# Patient Record
Sex: Female | Born: 1945 | Race: White | Hispanic: No | Smoking: Never smoker
Health system: Southern US, Community
[De-identification: ages and names within clinical notes are randomized; demographics above are authoritative.]

## PROBLEM LIST (undated history)

## (undated) DIAGNOSIS — M797 Fibromyalgia: Secondary | ICD-10-CM

## (undated) DIAGNOSIS — N183 Chronic kidney disease, stage 3 unspecified: Secondary | ICD-10-CM

## (undated) DIAGNOSIS — E785 Hyperlipidemia, unspecified: Secondary | ICD-10-CM

## (undated) DIAGNOSIS — N3281 Overactive bladder: Secondary | ICD-10-CM

## (undated) DIAGNOSIS — R600 Localized edema: Secondary | ICD-10-CM

## (undated) DIAGNOSIS — N281 Cyst of kidney, acquired: Secondary | ICD-10-CM

## (undated) DIAGNOSIS — I10 Essential (primary) hypertension: Secondary | ICD-10-CM

## (undated) DIAGNOSIS — N3946 Mixed incontinence: Secondary | ICD-10-CM

## (undated) DIAGNOSIS — E1142 Type 2 diabetes mellitus with diabetic polyneuropathy: Secondary | ICD-10-CM

## (undated) DIAGNOSIS — E039 Hypothyroidism, unspecified: Secondary | ICD-10-CM

## (undated) DIAGNOSIS — Z8719 Personal history of other diseases of the digestive system: Secondary | ICD-10-CM

## (undated) DIAGNOSIS — K219 Gastro-esophageal reflux disease without esophagitis: Secondary | ICD-10-CM

## (undated) DIAGNOSIS — Z794 Long term (current) use of insulin: Secondary | ICD-10-CM

## (undated) DIAGNOSIS — Z973 Presence of spectacles and contact lenses: Secondary | ICD-10-CM

## (undated) DIAGNOSIS — E119 Type 2 diabetes mellitus without complications: Secondary | ICD-10-CM

## (undated) DIAGNOSIS — Z8616 Personal history of COVID-19: Secondary | ICD-10-CM

## (undated) DIAGNOSIS — M1990 Unspecified osteoarthritis, unspecified site: Secondary | ICD-10-CM

## (undated) DIAGNOSIS — Z9889 Other specified postprocedural states: Secondary | ICD-10-CM

## (undated) DIAGNOSIS — F319 Bipolar disorder, unspecified: Secondary | ICD-10-CM

## (undated) DIAGNOSIS — R112 Nausea with vomiting, unspecified: Secondary | ICD-10-CM

## (undated) HISTORY — PX: COLONOSCOPY: SHX174

## (undated) HISTORY — PX: VENTRAL HERNIA REPAIR: SHX424

## (undated) HISTORY — PX: CHOLECYSTECTOMY OPEN: SUR202

## (undated) HISTORY — PX: ESOPHAGOGASTRODUODENOSCOPY: SHX1529

## (undated) HISTORY — PX: TONSILLECTOMY: SUR1361

---

## 1977-07-05 HISTORY — PX: VAGINAL HYSTERECTOMY: SUR661

## 1988-07-05 HISTORY — PX: OTHER SURGICAL HISTORY: SHX169

## 1998-06-26 ENCOUNTER — Ambulatory Visit (HOSPITAL_BASED_OUTPATIENT_CLINIC_OR_DEPARTMENT_OTHER): Admission: RE | Admit: 1998-06-26 | Discharge: 1998-06-26 | Payer: Self-pay | Admitting: Surgery

## 1998-08-15 ENCOUNTER — Ambulatory Visit (HOSPITAL_BASED_OUTPATIENT_CLINIC_OR_DEPARTMENT_OTHER): Admission: RE | Admit: 1998-08-15 | Discharge: 1998-08-15 | Payer: Self-pay | Admitting: Surgery

## 1998-08-15 ENCOUNTER — Encounter: Payer: Self-pay | Admitting: Surgery

## 2002-10-19 ENCOUNTER — Ambulatory Visit (HOSPITAL_COMMUNITY): Admission: RE | Admit: 2002-10-19 | Discharge: 2002-10-19 | Payer: Self-pay | Admitting: Surgery

## 2002-10-19 ENCOUNTER — Encounter: Payer: Self-pay | Admitting: Surgery

## 2008-07-05 HISTORY — PX: ORIF TIBIA & FIBULA FRACTURES: SHX2131

## 2011-11-17 DIAGNOSIS — I10 Essential (primary) hypertension: Secondary | ICD-10-CM | POA: Insufficient documentation

## 2014-02-28 DIAGNOSIS — N393 Stress incontinence (female) (male): Secondary | ICD-10-CM | POA: Insufficient documentation

## 2014-02-28 DIAGNOSIS — K219 Gastro-esophageal reflux disease without esophagitis: Secondary | ICD-10-CM | POA: Insufficient documentation

## 2014-02-28 DIAGNOSIS — E119 Type 2 diabetes mellitus without complications: Secondary | ICD-10-CM | POA: Insufficient documentation

## 2014-02-28 DIAGNOSIS — E039 Hypothyroidism, unspecified: Secondary | ICD-10-CM | POA: Insufficient documentation

## 2014-03-21 ENCOUNTER — Emergency Department (HOSPITAL_BASED_OUTPATIENT_CLINIC_OR_DEPARTMENT_OTHER): Payer: Medicare Other

## 2014-03-21 ENCOUNTER — Encounter (HOSPITAL_BASED_OUTPATIENT_CLINIC_OR_DEPARTMENT_OTHER): Payer: Self-pay | Admitting: Emergency Medicine

## 2014-03-21 ENCOUNTER — Emergency Department (HOSPITAL_BASED_OUTPATIENT_CLINIC_OR_DEPARTMENT_OTHER)
Admission: EM | Admit: 2014-03-21 | Discharge: 2014-03-21 | Disposition: A | Discharge status: Home / Self Care | Payer: Medicare Other | Attending: Emergency Medicine | Admitting: Emergency Medicine

## 2014-03-21 DIAGNOSIS — I129 Hypertensive chronic kidney disease with stage 1 through stage 4 chronic kidney disease, or unspecified chronic kidney disease: Secondary | ICD-10-CM | POA: Diagnosis not present

## 2014-03-21 DIAGNOSIS — E119 Type 2 diabetes mellitus without complications: Secondary | ICD-10-CM | POA: Insufficient documentation

## 2014-03-21 DIAGNOSIS — R51 Headache: Secondary | ICD-10-CM | POA: Insufficient documentation

## 2014-03-21 DIAGNOSIS — Z79899 Other long term (current) drug therapy: Secondary | ICD-10-CM | POA: Diagnosis not present

## 2014-03-21 DIAGNOSIS — Z8639 Personal history of other endocrine, nutritional and metabolic disease: Secondary | ICD-10-CM | POA: Insufficient documentation

## 2014-03-21 DIAGNOSIS — N189 Chronic kidney disease, unspecified: Secondary | ICD-10-CM | POA: Insufficient documentation

## 2014-03-21 DIAGNOSIS — R519 Headache, unspecified: Secondary | ICD-10-CM

## 2014-03-21 DIAGNOSIS — Z862 Personal history of diseases of the blood and blood-forming organs and certain disorders involving the immune mechanism: Secondary | ICD-10-CM | POA: Insufficient documentation

## 2014-03-21 DIAGNOSIS — E669 Obesity, unspecified: Secondary | ICD-10-CM | POA: Diagnosis not present

## 2014-03-21 HISTORY — DX: Hyperlipidemia, unspecified: E78.5

## 2014-03-21 HISTORY — DX: Essential (primary) hypertension: I10

## 2014-03-21 LAB — CBC WITH DIFFERENTIAL/PLATELET
Basophils Absolute: 0 10*3/uL (ref 0.0–0.1)
Basophils Relative: 0 % (ref 0–1)
Eosinophils Absolute: 0.1 10*3/uL (ref 0.0–0.7)
Eosinophils Relative: 2 % (ref 0–5)
HCT: 35.6 % — ABNORMAL LOW (ref 36.0–46.0)
Hemoglobin: 11.4 g/dL — ABNORMAL LOW (ref 12.0–15.0)
Lymphocytes Relative: 22 % (ref 12–46)
Lymphs Abs: 1.8 10*3/uL (ref 0.7–4.0)
MCH: 30.6 pg (ref 26.0–34.0)
MCHC: 32 g/dL (ref 30.0–36.0)
MCV: 95.4 fL (ref 78.0–100.0)
Monocytes Absolute: 0.6 10*3/uL (ref 0.1–1.0)
Monocytes Relative: 8 % (ref 3–12)
Neutro Abs: 5.5 10*3/uL (ref 1.7–7.7)
Neutrophils Relative %: 68 % (ref 43–77)
Platelets: 258 10*3/uL (ref 150–400)
RBC: 3.73 MIL/uL — ABNORMAL LOW (ref 3.87–5.11)
RDW: 13.8 % (ref 11.5–15.5)
WBC: 8.1 10*3/uL (ref 4.0–10.5)

## 2014-03-21 LAB — COMPREHENSIVE METABOLIC PANEL
ALT: 10 U/L (ref 0–35)
AST: 18 U/L (ref 0–37)
Albumin: 3.4 g/dL — ABNORMAL LOW (ref 3.5–5.2)
Alkaline Phosphatase: 71 U/L (ref 39–117)
Anion gap: 15 (ref 5–15)
BUN: 46 mg/dL — ABNORMAL HIGH (ref 6–23)
CO2: 27 mEq/L (ref 19–32)
Calcium: 9.5 mg/dL (ref 8.4–10.5)
Chloride: 95 mEq/L — ABNORMAL LOW (ref 96–112)
Creatinine, Ser: 2.2 mg/dL — ABNORMAL HIGH (ref 0.50–1.10)
GFR calc Af Amer: 25 mL/min — ABNORMAL LOW (ref >90)
GFR calc non Af Amer: 22 mL/min — ABNORMAL LOW (ref >90)
Glucose, Bld: 164 mg/dL — ABNORMAL HIGH (ref 70–99)
Potassium: 4.4 mEq/L (ref 3.7–5.3)
Sodium: 137 mEq/L (ref 137–147)
Total Bilirubin: 0.3 mg/dL (ref 0.3–1.2)
Total Protein: 7 g/dL (ref 6.0–8.3)

## 2014-03-21 MED ORDER — BUTALBITAL-APAP-CAFFEINE 50-325-40 MG PO TABS: 1.0000 | ORAL_TABLET | Freq: Four times a day (QID) | ORAL | Status: AC | PRN

## 2014-03-21 NOTE — Discharge Instructions (Signed)

## 2014-03-21 NOTE — ED Notes (Signed)
Reports HA since this AM. Sent by PMD for evaluation and CT scan of head.

## 2014-03-21 NOTE — ED Provider Notes (Signed)
CSN: [id number]     Arrival date & time 03/21/14  1730 History   First MD Initiated Contact with Patient 03/21/14 1746     Chief Complaint  Patient presents with  . Headache    HPI Pt started with a headache this am.  It is all over her head.   Dull in nature.  Nothing seems to make it better or worse.  She has had trouble with headaches in the past but not recently.  NO history of migraine.  She went to her doctor's office today and they thought she should have a CT scan.  Patient denies any numbness or weakness. She denies any trouble with her speech. She denies any injury. She denies any fevers or chills or neck pain.  She has had a sensation of being swimmy headed. This has been ongoing for months however and not associated with this recent headache. She has not noticed any significant change associated with that.  Patient also requests having some outpatient laboratory tests drawn while she was here. She has a sheet of paper with her with some laboratory tests her doctor requested. Past Medical History  Diagnosis Date  . Diabetes mellitus without complication   . Hypertension   . Hyperlipidemia    Past Surgical History  Procedure Laterality Date  . Abdominal hysterectomy    . Cholecystectomy    . Hernia repair     No family history on file. History  Substance Use Topics  . Smoking status: Never Smoker   . Smokeless tobacco: Not on file  . Alcohol Use: No   OB History   Grav Para Term Preterm Abortions TAB SAB Ect Mult Living                 Review of Systems  All other systems reviewed and are negative.     Allergies  Sulfa antibiotics  Home Medications   Prior to Admission medications   Medication Sig Start Date End Date Taking? Authorizing Provider  butalbital-acetaminophen-caffeine (FIORICET) 50-325-40 MG per tablet Take 1 tablet by mouth every 6 (six) hours as needed for headache. 03/21/14 03/21/15  Dorie Rank, MD   BP 126/51  Pulse 79  Temp(Src) 98.1 F  (36.7 C) (Oral)  Resp 18  Ht 6' (1.829 m)  Wt 180 lb (81.647 kg)  BMI 24.41 kg/m2  SpO2 95% Physical Exam  Nursing note and vitals reviewed. Constitutional: She is oriented to person, place, and time. She appears well-developed and well-nourished. No distress.  Obese  HENT:  Head: Normocephalic and atraumatic.  Right Ear: External ear normal.  Left Ear: External ear normal.  Mouth/Throat: Oropharynx is clear and moist.  Eyes: Conjunctivae are normal. Right eye exhibits no discharge. Left eye exhibits no discharge. No scleral icterus.  Neck: Neck supple. No tracheal deviation present.  Cardiovascular: Normal rate, regular rhythm and intact distal pulses.   Pulmonary/Chest: Effort normal and breath sounds normal. No stridor. No respiratory distress. She has no wheezes. She has no rales.  Abdominal: Soft. Bowel sounds are normal. She exhibits no distension. There is no tenderness. There is no rebound and no guarding.  Musculoskeletal: She exhibits no edema and no tenderness.  Neurological: She is alert and oriented to person, place, and time. She has normal strength. No cranial nerve deficit (no facial droop, extraocular movements intact, no slurred speech) or sensory deficit. She exhibits normal muscle tone. She displays no seizure activity. Coordination normal.  No pronator drift bilateral upper extrem, able to hold  both legs off bed for 5 seconds, sensation intact in all extremities, no visual field cuts, no left or right sided neglect, normal finger-nose exam bilaterally, no nystagmus noted   Skin: Skin is warm and dry. No rash noted.  Psychiatric: She has a normal mood and affect.    ED Course  Procedures (including critical care time) Labs Review Labs Reviewed  CBC WITH DIFFERENTIAL - Abnormal; Notable for the following:    RBC 3.73 (*)    Hemoglobin 11.4 (*)    HCT 35.6 (*)    All other components within normal limits  COMPREHENSIVE METABOLIC PANEL - Abnormal; Notable for  the following:    Chloride 95 (*)    Glucose, Bld 164 (*)    BUN 46 (*)    Creatinine, Ser 2.20 (*)    Albumin 3.4 (*)    GFR calc non Af Amer 22 (*)    GFR calc Af Amer 25 (*)    All other components within normal limits    Imaging Review Ct Head Wo Contrast  03/21/2014   CLINICAL DATA:  Headache. Dizziness. Blurred vision. No known injury.  EXAM: CT HEAD WITHOUT CONTRAST  TECHNIQUE: Contiguous axial images were obtained from the base of the skull through the vertex without intravenous contrast.  COMPARISON:  None.  FINDINGS: There is no intra or extra-axial fluid collection or mass lesion. The basilar cisterns and ventricles have a normal appearance. There is no CT evidence for acute infarction or hemorrhage. No windows are unremarkable.  IMPRESSION: No evidence for acute Edit abnormality.   Electronically Signed   By: Shon Hale M.D.   On: 03/21/2014 18:40     EKG Interpretation None      MDM   Final diagnoses:  Headache, unspecified headache type  Chronic renal insufficiency, unspecified stage   No neuro abnormalities.  Sent in by PCP for head CT.  CT unremarkable.  No fever or meningismus. No neurologic abnormalities to suggest stroke.  Patient did not want any medications for pain here in the emergency department.  Doubt [place]  Discharge her home with a prescription for Fioricet for symptomatic relief. Follow up with her primary doctor. She has plans to follow up with her kidney doctor regarding her chronic renal insufficiency.    Dorie Rank, MD 03/21/14 [phone]

## 2015-08-20 DIAGNOSIS — E7800 Pure hypercholesterolemia, unspecified: Secondary | ICD-10-CM | POA: Insufficient documentation

## 2015-08-20 DIAGNOSIS — E785 Hyperlipidemia, unspecified: Secondary | ICD-10-CM | POA: Insufficient documentation

## 2015-08-20 DIAGNOSIS — E782 Mixed hyperlipidemia: Secondary | ICD-10-CM | POA: Insufficient documentation

## 2016-06-28 DIAGNOSIS — N1831 Chronic kidney disease, stage 3a: Secondary | ICD-10-CM | POA: Insufficient documentation

## 2016-06-28 DIAGNOSIS — N183 Chronic kidney disease, stage 3 unspecified: Secondary | ICD-10-CM | POA: Insufficient documentation

## 2016-09-02 ENCOUNTER — Emergency Department (HOSPITAL_COMMUNITY)
Admission: EM | Admit: 2016-09-02 | Discharge: 2016-09-03 | Disposition: A | Discharge status: Home / Self Care | Payer: Medicare Other | Attending: Emergency Medicine | Admitting: Emergency Medicine

## 2016-09-02 ENCOUNTER — Emergency Department (HOSPITAL_COMMUNITY): Payer: Medicare Other

## 2016-09-02 ENCOUNTER — Encounter (HOSPITAL_COMMUNITY): Payer: Self-pay | Admitting: Emergency Medicine

## 2016-09-02 DIAGNOSIS — E1122 Type 2 diabetes mellitus with diabetic chronic kidney disease: Secondary | ICD-10-CM | POA: Insufficient documentation

## 2016-09-02 DIAGNOSIS — R0602 Shortness of breath: Secondary | ICD-10-CM | POA: Diagnosis not present

## 2016-09-02 DIAGNOSIS — M79605 Pain in left leg: Secondary | ICD-10-CM

## 2016-09-02 DIAGNOSIS — R600 Localized edema: Secondary | ICD-10-CM | POA: Diagnosis not present

## 2016-09-02 DIAGNOSIS — N189 Chronic kidney disease, unspecified: Secondary | ICD-10-CM | POA: Insufficient documentation

## 2016-09-02 DIAGNOSIS — I129 Hypertensive chronic kidney disease with stage 1 through stage 4 chronic kidney disease, or unspecified chronic kidney disease: Secondary | ICD-10-CM | POA: Diagnosis not present

## 2016-09-02 DIAGNOSIS — M7989 Other specified soft tissue disorders: Secondary | ICD-10-CM | POA: Diagnosis present

## 2016-09-02 DIAGNOSIS — R609 Edema, unspecified: Secondary | ICD-10-CM

## 2016-09-02 DIAGNOSIS — M545 Low back pain: Secondary | ICD-10-CM | POA: Diagnosis not present

## 2016-09-02 DIAGNOSIS — N3000 Acute cystitis without hematuria: Secondary | ICD-10-CM

## 2016-09-02 LAB — CBC WITH DIFFERENTIAL/PLATELET
Basophils Absolute: 0 10*3/uL (ref 0.0–0.1)
Basophils Relative: 0 %
Eosinophils Absolute: 0 10*3/uL (ref 0.0–0.7)
Eosinophils Relative: 0 %
HCT: 41.2 % (ref 36.0–46.0)
Hemoglobin: 13.5 g/dL (ref 12.0–15.0)
Lymphocytes Relative: 25 %
Lymphs Abs: 1.6 10*3/uL (ref 0.7–4.0)
MCH: 30.3 pg (ref 26.0–34.0)
MCHC: 32.8 g/dL (ref 30.0–36.0)
MCV: 92.6 fL (ref 78.0–100.0)
Monocytes Absolute: 0.4 10*3/uL (ref 0.1–1.0)
Monocytes Relative: 7 %
Neutro Abs: 4.4 10*3/uL (ref 1.7–7.7)
Neutrophils Relative %: 68 %
Platelets: 266 10*3/uL (ref 150–400)
RBC: 4.45 MIL/uL (ref 3.87–5.11)
RDW: 16.3 % — ABNORMAL HIGH (ref 11.5–15.5)
WBC: 6.5 10*3/uL (ref 4.0–10.5)

## 2016-09-02 LAB — COMPREHENSIVE METABOLIC PANEL
ALT: 13 U/L — ABNORMAL LOW (ref 14–54)
AST: 17 U/L (ref 15–41)
Albumin: 3.4 g/dL — ABNORMAL LOW (ref 3.5–5.0)
Alkaline Phosphatase: 48 U/L (ref 38–126)
Anion gap: 7 (ref 5–15)
BUN: 34 mg/dL — ABNORMAL HIGH (ref 6–20)
CO2: 26 mmol/L (ref 22–32)
Calcium: 9.3 mg/dL (ref 8.9–10.3)
Chloride: 109 mmol/L (ref 101–111)
Creatinine, Ser: 1.58 mg/dL — ABNORMAL HIGH (ref 0.44–1.00)
GFR calc Af Amer: 37 mL/min — ABNORMAL LOW (ref >60)
GFR calc non Af Amer: 32 mL/min — ABNORMAL LOW (ref >60)
Glucose, Bld: 224 mg/dL — ABNORMAL HIGH (ref 65–99)
Potassium: 3.7 mmol/L (ref 3.5–5.1)
Sodium: 142 mmol/L (ref 135–145)
Total Bilirubin: 0.7 mg/dL (ref 0.3–1.2)
Total Protein: 6.2 g/dL — ABNORMAL LOW (ref 6.5–8.1)

## 2016-09-02 LAB — I-STAT TROPONIN, ED: Troponin i, poc: 0.01 ng/mL (ref 0.00–0.08)

## 2016-09-02 LAB — BRAIN NATRIURETIC PEPTIDE: B Natriuretic Peptide: 115.4 pg/mL — ABNORMAL HIGH (ref 0.0–100.0)

## 2016-09-02 NOTE — ED Provider Notes (Signed)
[facility] DEPT Provider Note   CSN: [id number] Arrival date & time: 09/02/16  [place]     History   Chief Complaint Chief Complaint  Patient presents with  . Leg Swelling    HPI Toni Hill is a 71 y.o. female.  Patient is a 89 -year-old female with a history of diabetes, hypertension, hyperlipidemia and chronic kidney disease presenting today with lower extremity swelling. She noticed mild right lower leg swelling for some time related to a prior surgery but saw her nephrologist [place] yesterday and was told she was dehydrated. She noticed today significant swelling of her left lower leg which she states is painful when she attempts to walk. She has had ongoing shortness of breath but does not feel that it's any worse than normal. She also complains of urinary frequency, urgency faint pink when she wipes which was started in the last 5 days but isn't significantly worse today. She states that she feels like she needs to urinate but can't.  She denies any chest pain, abdominal pain. She states she is very worried and tearful intermittently. She denies any recent medication changes. She has no fevers but has noticed some bilateral back pain.   The history is provided by the patient.    Past Medical History:  Diagnosis Date  . Diabetes mellitus without complication ([place])   . Hyperlipidemia   . Hypertension     There are no active problems to display for this patient.   Past Surgical History:  Procedure Laterality Date  . ABDOMINAL HYSTERECTOMY    . CHOLECYSTECTOMY    . HERNIA REPAIR      OB History    No data available       Home Medications    Prior to Admission medications   Not on File    Family History No family history on file.  Social History Social History  Substance Use Topics  . Smoking status: Never Smoker  . Smokeless tobacco: Never Used  . Alcohol use No     Allergies   Sulfa antibiotics   Review of Systems Review of Systems  All  other systems reviewed and are negative.    Physical Exam Updated Vital Signs BP 150/62 (BP Location: Left Arm)   Pulse 96   Temp 98.2 F (36.8 C) (Oral)   Resp 18   SpO2 98%   Physical Exam  Constitutional: She is oriented to person, place, and time. She appears well-developed and well-nourished. No distress.  Intermittently tearful on exam and appears mildly anxious  HENT:  Head: Normocephalic and atraumatic.  Mouth/Throat: Oropharynx is clear and moist.  Eyes: Conjunctivae and EOM are normal. Pupils are equal, round, and reactive to light.  Neck: Normal range of motion. Neck supple.  Cardiovascular: Normal rate, regular rhythm and intact distal pulses.   No murmur heard. Pulmonary/Chest: Effort normal and breath sounds normal. No respiratory distress. She has no wheezes. She has no rales.  Abdominal: Soft. She exhibits no distension. There is no tenderness. There is no rebound and no guarding.  Mild lower back pain with palpation. No significant CVA tenderness.  Musculoskeletal: Normal range of motion. She exhibits edema and tenderness.  1+ edema in the foot and lower leg. Tenderness with palpation to the calf and thigh.  Neurological: She is alert and oriented to person, place, and time.  Skin: Skin is warm and dry. No rash noted. No erythema.  Psychiatric: She has a normal mood and affect. Her behavior is normal.  Nursing note and vitals reviewed.    ED Treatments / Results  Labs (all labs ordered are listed, but only abnormal results are displayed) Labs Reviewed  CBC WITH DIFFERENTIAL/PLATELET - Abnormal; Notable for the following:       Result Value   RDW 16.3 (*)    All other components within normal limits  COMPREHENSIVE METABOLIC PANEL - Abnormal; Notable for the following:    Glucose, Bld 224 (*)    BUN 34 (*)    Creatinine, Ser 1.58 (*)    Total Protein 6.2 (*)    Albumin 3.4 (*)    ALT 13 (*)    GFR calc non Af Amer 32 (*)    GFR calc Af Amer 37 (*)     All other components within normal limits  BRAIN NATRIURETIC PEPTIDE - Abnormal; Notable for the following:    B Natriuretic Peptide 115.4 (*)    All other components within normal limits  URINALYSIS, ROUTINE W REFLEX MICROSCOPIC - Abnormal; Notable for the following:    Glucose, UA >=500 (*)    Ketones, ur 5 (*)    Nitrite POSITIVE (*)    Bacteria, UA MANY (*)    All other components within normal limits  URINE CULTURE  I-STAT TROPOININ, ED    EKG ED ECG REPORT   Date: 09/03/2016  Rate: 88  Rhythm: normal sinus rhythm  QRS Axis: normal  Intervals: normal  ST/T Wave abnormalities: normal  Conduction Disutrbances:none  Narrative Interpretation:   Old EKG Reviewed: unchanged  I have personally reviewed the EKG tracing and agree with the computerized printout as noted.  Radiology Dg Chest Port 1 View  Result Date: 09/02/2016 CLINICAL DATA:  Shortness of breath.  Bilateral leg swelling. EXAM: PORTABLE CHEST 1 VIEW COMPARISON:  Radiograph 11/22/2010. Report from radiographs 06/03/2014, images not available. FINDINGS: Stable mild cardiomegaly. Mild vascular congestion without overt edema. There is elevation of the right hemidiaphragm. No confluent consolidation. Heterogeneous bibasilar opacities, left greater than right. No evidence of pleural fluid. No pneumothorax. No acute osseous abnormalities are seen. IMPRESSION: 1. Vascular congestion without overt edema. Stable mild cardiomegaly. 2. Heterogeneous bibasilar opacities, favored atelectasis Electronically Signed   By: Jeb Levering M.D.   On: 09/02/2016 19:45    Procedures Procedures (including critical care time)  Medications Ordered in ED Medications  furosemide (LASIX) tablet 20 mg (not administered)  enoxaparin (LOVENOX) injection 1 mg/kg (not administered)  morphine 4 MG/ML injection 4 mg (4 mg Intravenous Given 09/03/16 0131)  ondansetron (ZOFRAN) injection 4 mg (4 mg Intravenous Given 09/03/16 0131)  cephALEXin (KEFLEX)  capsule 500 mg (500 mg Oral Given 09/03/16 0131)     Initial Impression / Assessment and Plan / ED Course  I have reviewed the triage vital signs and the nursing notes.  Pertinent labs & imaging results that were available during my care of the patient were reviewed by me and considered in my medical decision making (see chart for details).    Patient is a 71 year old female presenting today with new left lower extremity pain and swelling. Patient is also complaining of dysuria and symptoms concerning for urinary retention. She denies any abdominal distention or new shortness of breath. She has a history of chronic kidney disease and saw her nephrologist yesterday and was told she was dehydrated but patient has been eating and drinking normally. She has never had swelling in her left leg before and always has mild swelling in her right lower extremity. Concern for potential DVT  versus renal failure, urinary retention or fluid overload. CBC, CMP, BNP, troponin, EKG, venous study of the left lower extremity and UA pending. Also will do a bladder scan.  1:06 AM On bladder scan patient only has 124mL of urine. UA is concerning for a UTI with positive nitrites and many bacteria in the setting of dysuria and will treat with Keflex. Urine culture pending. Troponin within normal limits, CBC within normal limits, CMP with improving renal function with a creatinine of 1.5.  BNP only mildly elevated at 115. Chest x-ray with vascular congestion without overt edema and stable cardiomegaly. On reevaluation patient continues to complain of pain in her left lower extremity. Bedside ultrasound shows compressible femoral veins but given patient's size difficult to see from popliteal and below. Unclear what's causing all the pain and the patient's leg it may be just related to the swelling however there is no evidence of erythema or signs of cellulitis.  Will cover with Lovenox but low suspicion and so she can get formal  ultrasound tomorrow. Patient given pain medication and will ensure that she is able to ambulate. No evidence of infection or concern for septic hip. Will give a short course of Lasix for lower extremity edema. 2:06 AM Patient improved with pain medication was able to ambulate. Given follow-up for ultrasound tomorrow. Given antibiotics and pain control. Chest importance of follow-up with nephrologist and PCP. Given strict return precautions.  Final Clinical Impressions(s) / ED Diagnoses   Final diagnoses:  Peripheral edema  Left leg pain  Acute cystitis without hematuria    New Prescriptions New Prescriptions   CEPHALEXIN (KEFLEX) 500 MG CAPSULE    Take 1 capsule (500 mg total) by mouth 2 (two) times daily.   FUROSEMIDE (LASIX) 20 MG TABLET    Take 1 tablet (20 mg total) by mouth daily.   HYDROCODONE-ACETAMINOPHEN (NORCO/VICODIN) 5-325 MG TABLET    Take 1 tablet by mouth every 6 (six) hours as needed for severe pain.     Blanchie Dessert, MD 09/03/16 [phone]

## 2016-09-02 NOTE — ED Notes (Signed)
Bed: [facility] Expected date:  Expected time:  Means of arrival:  Comments: No bed 

## 2016-09-02 NOTE — ED Triage Notes (Signed)
Per [facility] patient from home for bilat leg swelling for several days. Patient called nurse line and was advised to come to ED.

## 2016-09-03 ENCOUNTER — Ambulatory Visit (HOSPITAL_BASED_OUTPATIENT_CLINIC_OR_DEPARTMENT_OTHER)
Admission: RE | Admit: 2016-09-03 | Discharge: 2016-09-03 | Disposition: A | Discharge status: Home / Self Care | Payer: Medicare Other | Source: Ambulatory Visit | Attending: Emergency Medicine | Admitting: Emergency Medicine

## 2016-09-03 DIAGNOSIS — M79609 Pain in unspecified limb: Secondary | ICD-10-CM

## 2016-09-03 DIAGNOSIS — M7989 Other specified soft tissue disorders: Secondary | ICD-10-CM | POA: Diagnosis not present

## 2016-09-03 DIAGNOSIS — R600 Localized edema: Secondary | ICD-10-CM | POA: Diagnosis not present

## 2016-09-03 LAB — URINALYSIS, ROUTINE W REFLEX MICROSCOPIC
Bilirubin Urine: NEGATIVE
Glucose, UA: >=500 mg/dL — AB
Hgb urine dipstick: NEGATIVE
Ketones, ur: 5 mg/dL — AB
Leukocytes, UA: NEGATIVE
Nitrite: POSITIVE — AB
Protein, ur: NEGATIVE mg/dL
Specific Gravity, Urine: 1.026 (ref 1.005–1.030)
Squamous Epithelial / LPF: NONE SEEN
pH: 5 (ref 5.0–8.0)

## 2016-09-03 MED ORDER — CEPHALEXIN 500 MG PO CAPS
500.0000 mg | ORAL_CAPSULE | Freq: Once | ORAL | Status: AC
  Administered 2016-09-03: 500 mg via ORAL
  Filled 2016-09-03: qty 1

## 2016-09-03 MED ORDER — HYDROCODONE-ACETAMINOPHEN 5-325 MG PO TABS: 1.0000 | ORAL_TABLET | Freq: Four times a day (QID) | ORAL | 0 refills | Status: DC | PRN

## 2016-09-03 MED ORDER — MORPHINE SULFATE (PF) 4 MG/ML IV SOLN
4.0000 mg | Freq: Once | INTRAVENOUS | Status: AC
  Administered 2016-09-03: 4 mg via INTRAVENOUS
  Filled 2016-09-03: qty 1

## 2016-09-03 MED ORDER — FUROSEMIDE 20 MG PO TABS: 20.0000 mg | ORAL_TABLET | Freq: Every day | ORAL | 0 refills | Status: DC

## 2016-09-03 MED ORDER — ONDANSETRON HCL 4 MG/2ML IJ SOLN
4.0000 mg | Freq: Once | INTRAMUSCULAR | Status: AC
  Administered 2016-09-03: 4 mg via INTRAVENOUS
  Filled 2016-09-03: qty 2

## 2016-09-03 MED ORDER — ENOXAPARIN SODIUM 120 MG/0.8ML ~~LOC~~ SOLN
120.0000 mg | Freq: Once | SUBCUTANEOUS | Status: AC
  Administered 2016-09-03: 120 mg via SUBCUTANEOUS
  Filled 2016-09-03: qty 0.8

## 2016-09-03 MED ORDER — FUROSEMIDE 40 MG PO TABS
20.0000 mg | ORAL_TABLET | Freq: Once | ORAL | Status: AC
  Administered 2016-09-03: 20 mg via ORAL
  Filled 2016-09-03: qty 1

## 2016-09-03 MED ORDER — CEPHALEXIN 500 MG PO CAPS: 500.0000 mg | ORAL_CAPSULE | Freq: Two times a day (BID) | ORAL | 0 refills | Status: DC

## 2016-09-03 NOTE — Progress Notes (Signed)
VASCULAR LAB PRELIMINARY  PRELIMINARY  PRELIMINARY  PRELIMINARY  Left lower extremity venous duplex completed.    Preliminary report:  Left:  No evidence of DVT, superficial thrombosis, or Baker's cyst.  Fin Hupp, RVS 09/03/2016, 4:59 PM

## 2016-09-05 LAB — URINE CULTURE: Culture: >=100000 — AB

## 2016-09-06 ENCOUNTER — Telehealth: Payer: Self-pay | Admitting: Emergency Medicine

## 2016-09-06 NOTE — Telephone Encounter (Signed)
Post ED Visit - Positive Culture Follow-up  Culture report reviewed by antimicrobial stewardship pharmacist:  []  Elenor Quinones, Pharm.D. []  Heide Guile, Pharm.D., BCPS []  Parks Neptune, Pharm.D. [x]  Alycia Rossetti, Pharm.D., BCPS []  [place], Pharm.D., BCPS, AAHIVP []  Legrand Como, Pharm.D., BCPS, AAHIVP []  Milus Glazier, Pharm.D. []  Stephens November, Pharm.D.  Positive urine culture Treated with cephalexin, organism sensitive to the same and no further patient follow-up is required at this time.  Hazle Nordmann 09/06/2016, 3:05 PM

## 2016-10-27 ENCOUNTER — Encounter (HOSPITAL_BASED_OUTPATIENT_CLINIC_OR_DEPARTMENT_OTHER): Payer: Self-pay | Admitting: *Deleted

## 2016-10-27 ENCOUNTER — Other Ambulatory Visit: Payer: Self-pay | Admitting: Urology

## 2016-10-27 NOTE — Progress Notes (Signed)
NPO AFTER MN.  ARRIVE AT 0730.  NEEDS ISTAT 8.  CURRENT EKG IN CHART AND EPIC.  WILL TAKE SYNTHROID AND PRILOSEC AM DOS W/ SIPS OF WATER.

## 2016-10-29 ENCOUNTER — Ambulatory Visit (HOSPITAL_BASED_OUTPATIENT_CLINIC_OR_DEPARTMENT_OTHER): Payer: Medicare Other | Admitting: Anesthesiology

## 2016-10-29 ENCOUNTER — Encounter (HOSPITAL_BASED_OUTPATIENT_CLINIC_OR_DEPARTMENT_OTHER)
Admission: RE | Disposition: A | Discharge status: Home / Self Care | Payer: Self-pay | Source: Ambulatory Visit | Attending: Urology

## 2016-10-29 ENCOUNTER — Ambulatory Visit (HOSPITAL_BASED_OUTPATIENT_CLINIC_OR_DEPARTMENT_OTHER)
Admission: RE | Admit: 2016-10-29 | Discharge: 2016-10-29 | Disposition: A | Discharge status: Home / Self Care | Payer: Medicare Other | Source: Ambulatory Visit | Attending: Urology | Admitting: Urology

## 2016-10-29 ENCOUNTER — Encounter (HOSPITAL_BASED_OUTPATIENT_CLINIC_OR_DEPARTMENT_OTHER): Payer: Self-pay | Admitting: *Deleted

## 2016-10-29 DIAGNOSIS — B373 Candidiasis of vulva and vagina: Secondary | ICD-10-CM | POA: Insufficient documentation

## 2016-10-29 DIAGNOSIS — E7800 Pure hypercholesterolemia, unspecified: Secondary | ICD-10-CM | POA: Insufficient documentation

## 2016-10-29 DIAGNOSIS — N359 Urethral stricture, unspecified: Secondary | ICD-10-CM | POA: Insufficient documentation

## 2016-10-29 DIAGNOSIS — R32 Unspecified urinary incontinence: Secondary | ICD-10-CM | POA: Insufficient documentation

## 2016-10-29 DIAGNOSIS — I129 Hypertensive chronic kidney disease with stage 1 through stage 4 chronic kidney disease, or unspecified chronic kidney disease: Secondary | ICD-10-CM | POA: Insufficient documentation

## 2016-10-29 DIAGNOSIS — N183 Chronic kidney disease, stage 3 (moderate): Secondary | ICD-10-CM | POA: Insufficient documentation

## 2016-10-29 DIAGNOSIS — E039 Hypothyroidism, unspecified: Secondary | ICD-10-CM | POA: Insufficient documentation

## 2016-10-29 DIAGNOSIS — Z880 Allergy status to penicillin: Secondary | ICD-10-CM | POA: Diagnosis not present

## 2016-10-29 DIAGNOSIS — Z79899 Other long term (current) drug therapy: Secondary | ICD-10-CM | POA: Insufficient documentation

## 2016-10-29 DIAGNOSIS — E1122 Type 2 diabetes mellitus with diabetic chronic kidney disease: Secondary | ICD-10-CM | POA: Diagnosis not present

## 2016-10-29 DIAGNOSIS — N3281 Overactive bladder: Secondary | ICD-10-CM | POA: Diagnosis not present

## 2016-10-29 DIAGNOSIS — Z794 Long term (current) use of insulin: Secondary | ICD-10-CM | POA: Insufficient documentation

## 2016-10-29 HISTORY — DX: Gastro-esophageal reflux disease without esophagitis: K21.9

## 2016-10-29 HISTORY — DX: Type 2 diabetes mellitus with diabetic polyneuropathy: E11.42

## 2016-10-29 HISTORY — DX: Bipolar disorder, unspecified: F31.9

## 2016-10-29 HISTORY — DX: Other specified postprocedural states: R11.2

## 2016-10-29 HISTORY — DX: Fibromyalgia: M79.7

## 2016-10-29 HISTORY — PX: CYSTOSCOPY WITH INJECTION: SHX1424

## 2016-10-29 HISTORY — DX: Personal history of other diseases of the digestive system: Z87.19

## 2016-10-29 HISTORY — DX: Overactive bladder: N32.81

## 2016-10-29 HISTORY — DX: Chronic kidney disease, stage 3 (moderate): N18.3

## 2016-10-29 HISTORY — DX: Chronic kidney disease, stage 3 unspecified: N18.30

## 2016-10-29 HISTORY — DX: Other specified postprocedural states: Z98.890

## 2016-10-29 HISTORY — DX: Type 2 diabetes mellitus without complications: E11.9

## 2016-10-29 HISTORY — DX: Presence of spectacles and contact lenses: Z97.3

## 2016-10-29 HISTORY — DX: Localized edema: R60.0

## 2016-10-29 HISTORY — DX: Cyst of kidney, acquired: N28.1

## 2016-10-29 HISTORY — DX: Unspecified osteoarthritis, unspecified site: M19.90

## 2016-10-29 HISTORY — DX: Hypothyroidism, unspecified: E03.9

## 2016-10-29 HISTORY — DX: Mixed incontinence: N39.46

## 2016-10-29 HISTORY — DX: Long term (current) use of insulin: Z79.4

## 2016-10-29 LAB — POCT I-STAT, CHEM 8
BUN: 24 mg/dL — ABNORMAL HIGH (ref 6–20)
Calcium, Ion: 1.23 mmol/L (ref 1.15–1.40)
Chloride: 105 mmol/L (ref 101–111)
Creatinine, Ser: 1.4 mg/dL — ABNORMAL HIGH (ref 0.44–1.00)
Glucose, Bld: 114 mg/dL — ABNORMAL HIGH (ref 65–99)
HCT: 41 % (ref 36.0–46.0)
Hemoglobin: 13.9 g/dL (ref 12.0–15.0)
Potassium: 3.9 mmol/L (ref 3.5–5.1)
Sodium: 143 mmol/L (ref 135–145)
TCO2: 29 mmol/L (ref 0–100)

## 2016-10-29 LAB — GLUCOSE, CAPILLARY: Glucose-Capillary: 108 mg/dL — ABNORMAL HIGH (ref 65–99)

## 2016-10-29 SURGERY — CYSTOSCOPY, WITH INJECTION OF BLADDER NECK OR BLADDER WALL
Anesthesia: General | Site: Bladder

## 2016-10-29 MED ORDER — FENTANYL CITRATE (PF) 100 MCG/2ML IJ SOLN
25.0000 ug | INTRAMUSCULAR | Status: DC | PRN
  Administered 2016-10-29: 25 ug via INTRAVENOUS
  Filled 2016-10-29: qty 1

## 2016-10-29 MED ORDER — DEXAMETHASONE SODIUM PHOSPHATE 10 MG/ML IJ SOLN
INTRAMUSCULAR | Status: AC
  Filled 2016-10-29: qty 1

## 2016-10-29 MED ORDER — LIDOCAINE 2% (20 MG/ML) 5 ML SYRINGE
INTRAMUSCULAR | Status: DC | PRN
  Administered 2016-10-29: 60 mg via INTRAVENOUS

## 2016-10-29 MED ORDER — ONABOTULINUMTOXINA 100 UNITS IJ SOLR
INTRAMUSCULAR | Status: AC
  Filled 2016-10-29: qty 100

## 2016-10-29 MED ORDER — FENTANYL CITRATE (PF) 100 MCG/2ML IJ SOLN
INTRAMUSCULAR | Status: DC | PRN
  Administered 2016-10-29 (×2): 50 ug via INTRAVENOUS

## 2016-10-29 MED ORDER — CEFAZOLIN SODIUM-DEXTROSE 2-4 GM/100ML-% IV SOLN
INTRAVENOUS | Status: AC
  Filled 2016-10-29: qty 100

## 2016-10-29 MED ORDER — NITROFURANTOIN MACROCRYSTAL 50 MG PO CAPS: 50.0000 mg | ORAL_CAPSULE | Freq: Every day | ORAL | 0 refills | Status: AC

## 2016-10-29 MED ORDER — OXYCHLOROSENE SODIUM POWD
Status: AC
  Filled 2016-10-29: qty 2

## 2016-10-29 MED ORDER — FENTANYL CITRATE (PF) 100 MCG/2ML IJ SOLN
INTRAMUSCULAR | Status: AC
  Filled 2016-10-29: qty 2

## 2016-10-29 MED ORDER — CEFAZOLIN SODIUM-DEXTROSE 2-3 GM-% IV SOLR
INTRAVENOUS | Status: DC | PRN
Start: 2016-10-29 — End: 2016-10-29
  Administered 2016-10-29: 2 g via INTRAVENOUS

## 2016-10-29 MED ORDER — ACETAMINOPHEN 500 MG PO TABS
1000.0000 mg | ORAL_TABLET | Freq: Four times a day (QID) | ORAL | Status: AC | PRN
  Administered 2016-10-29: 1000 mg via ORAL
  Filled 2016-10-29: qty 2

## 2016-10-29 MED ORDER — ONDANSETRON HCL 4 MG/2ML IJ SOLN
INTRAMUSCULAR | Status: AC
  Filled 2016-10-29: qty 2

## 2016-10-29 MED ORDER — DEXAMETHASONE SODIUM PHOSPHATE 10 MG/ML IJ SOLN
INTRAMUSCULAR | Status: DC | PRN
  Administered 2016-10-29: 10 mg via INTRAVENOUS

## 2016-10-29 MED ORDER — ONABOTULINUMTOXINA 100 UNITS IJ SOLR
INTRAMUSCULAR | Status: DC | PRN
  Administered 2016-10-29: 100 [IU] via INTRAMUSCULAR

## 2016-10-29 MED ORDER — LIDOCAINE 2% (20 MG/ML) 5 ML SYRINGE
INTRAMUSCULAR | Status: AC
  Filled 2016-10-29: qty 5

## 2016-10-29 MED ORDER — PROPOFOL 10 MG/ML IV BOLUS
INTRAVENOUS | Status: AC
  Filled 2016-10-29: qty 20

## 2016-10-29 MED ORDER — SODIUM CHLORIDE 0.9 % IJ SOLN
INTRAMUSCULAR | Status: DC | PRN
  Administered 2016-10-29: 10 mL

## 2016-10-29 MED ORDER — ONDANSETRON HCL 4 MG/2ML IJ SOLN
INTRAMUSCULAR | Status: DC | PRN
  Administered 2016-10-29: 4 mg via INTRAVENOUS

## 2016-10-29 MED ORDER — CIPROFLOXACIN IN D5W 400 MG/200ML IV SOLN
INTRAVENOUS | Status: AC
  Filled 2016-10-29: qty 200

## 2016-10-29 MED ORDER — PROMETHAZINE HCL 25 MG/ML IJ SOLN
6.2500 mg | INTRAMUSCULAR | Status: DC | PRN
  Filled 2016-10-29: qty 1

## 2016-10-29 MED ORDER — SODIUM CHLORIDE 0.9 % IJ SOLN
INTRAMUSCULAR | Status: AC
  Filled 2016-10-29: qty 50

## 2016-10-29 MED ORDER — BELLADONNA ALKALOIDS-OPIUM 16.2-60 MG RE SUPP
RECTAL | Status: AC
  Filled 2016-10-29: qty 1

## 2016-10-29 MED ORDER — PROPOFOL 10 MG/ML IV BOLUS
INTRAVENOUS | Status: AC
  Filled 2016-10-29: qty 40

## 2016-10-29 MED ORDER — SODIUM CHLORIDE 0.9 % IV SOLN
INTRAVENOUS | Status: DC
  Administered 2016-10-29: 09:00:00 via INTRAVENOUS
  Filled 2016-10-29: qty 1000

## 2016-10-29 MED ORDER — LIDOCAINE HCL 2 % EX GEL
CUTANEOUS | Status: AC
  Filled 2016-10-29: qty 5

## 2016-10-29 MED ORDER — PROPOFOL 10 MG/ML IV BOLUS
INTRAVENOUS | Status: DC | PRN
  Administered 2016-10-29: 190 mg via INTRAVENOUS
  Administered 2016-10-29: 50 mg via INTRAVENOUS

## 2016-10-29 MED ORDER — ACETAMINOPHEN 500 MG PO TABS
ORAL_TABLET | ORAL | Status: AC
  Filled 2016-10-29: qty 2

## 2016-10-29 SURGICAL SUPPLY — 22 items
BAG DRAIN URO-CYSTO SKYTR STRL (DRAIN) ×2 IMPLANT
BOOTIES KNEE HIGH SLOAN (MISCELLANEOUS) ×2 IMPLANT
CATH ROBINSON RED A/P 12FR (CATHETERS) IMPLANT
CLOTH BEACON ORANGE TIMEOUT ST (SAFETY) ×2 IMPLANT
ELECT REM PT RETURN 9FT ADLT (ELECTROSURGICAL)
ELECTRODE REM PT RTRN 9FT ADLT (ELECTROSURGICAL) IMPLANT
GLOVE BIO SURGEON STRL SZ7.5 (GLOVE) ×2 IMPLANT
GOWN STRL REUS W/ TWL LRG LVL3 (GOWN DISPOSABLE) ×1 IMPLANT
GOWN STRL REUS W/ TWL XL LVL3 (GOWN DISPOSABLE) ×1 IMPLANT
GOWN STRL REUS W/TWL LRG LVL3 (GOWN DISPOSABLE) ×1
GOWN STRL REUS W/TWL XL LVL3 (GOWN DISPOSABLE) ×1
KIT RM TURNOVER CYSTO AR (KITS) ×2 IMPLANT
MANIFOLD NEPTUNE II (INSTRUMENTS) ×2 IMPLANT
NDL SAFETY ECLIPSE 18X1.5 (NEEDLE) ×1 IMPLANT
NEEDLE HYPO 18GX1.5 SHARP (NEEDLE) ×1
NEEDLE SPNL 22GX7 QUINCKE BK (NEEDLE) IMPLANT
PACK CYSTO (CUSTOM PROCEDURE TRAY) ×2 IMPLANT
SYR 20CC LL (SYRINGE) IMPLANT
SYR BULB IRRIGATION 50ML (SYRINGE) IMPLANT
SYRINGE 10CC LL (SYRINGE) ×4 IMPLANT
TUBE CONNECTING 12X1/4 (SUCTIONS) ×2 IMPLANT
WATER STERILE IRR 3000ML UROMA (IV SOLUTION) ×2 IMPLANT

## 2016-10-29 NOTE — Anesthesia Procedure Notes (Signed)
Performed by: [facility]

## 2016-10-29 NOTE — Anesthesia Procedure Notes (Signed)
Procedure Name: LMA Insertion Date/Time: 10/29/2016 9:02 AM Performed by: Bethena Roys T Pre-anesthesia Checklist: Patient identified, Emergency Drugs available, Suction available and Patient being monitored Patient Re-evaluated:Patient Re-evaluated prior to inductionOxygen Delivery Method: Circle system utilized Preoxygenation: Pre-oxygenation with 100% oxygen Intubation Type: IV induction Ventilation: Mask ventilation without difficulty LMA: LMA inserted LMA Size: 4.0 Number of attempts: 1 Airway Equipment and Method: Bite block Placement Confirmation: positive ETCO2 Tube secured with: Tape Dental Injury: Teeth and Oropharynx as per pre-operative assessment

## 2016-10-29 NOTE — Op Note (Signed)
Pre-operative diagnosis :  Overactive bladdr with urinary frequency and urgency.   Postoperative diagnosis: Same, plus urethral stenosis  Operation:  Urethral dilation to 9F, cystoscopy, Botox submucosal injection ( 100units)  Surgeon:  S. Gaynelle Arabian, MD  First assistant:  None  Anesthesia: General LMA  Preparation: After appropriate premedication, the patient was brought the operative room, placed on the operating table in the dorsal supine position where general LMA anesthesia was introduced. She was then replaced in the dorsal lithotomy position with the pubis was prepped with Betadine solution and draped in the usual fashion. The history was double checked. The arm and was double checked.  Review history:   HPI: Toni Hill is a 71 year-old female established patient who is here for urinary frequency.  Ms. Pinch presents today with complaints of dysuria, bladder pressure, vaginal burning, and vaginal itching. She also complains of increased frequency, urgency, and urge incontinence. She has been placed on several antibiotics recently for URI. She was placed on Levaquin most recently, 10/08/16, and completed the course. She states that her urinary symptoms started when she began her Levaquin. She denies any fevers, gross hematuria, or flank pain. She is very adamant about wanting me to prescribe a Z-Pack and Prednisone for her URI today.    She does urinate more frequently than once every 4 hours in the daytime.   She does not have to strain or bear down to start her urinary stream. She does not have an abnormal sensation when needing to urinate.   She is having problems with urinary control or incontinence.   Statement of  Likelihood of Success: Excellent. 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 The injection scope  was then admitted easily, using the obturator, which was then removed, and the injection mechanism placed. A 10 mL syringe containing 100 units of Botox, reconstituted, was then placed on the injection scope. 10 separate submucosal areas of injection were selected above the trigone, with excellent medication delivery. No bleeding was noted. After the 10 injections, a 1 mL saline injection was delivered, 2 layer out any available medication left in the needle.  The bladder was drained of fluid, and the instrument withdrawn. The patient was awakened, and taken to recovery room in good condition.

## 2016-10-29 NOTE — H&P (Signed)
Office Visit Report     10/22/2016   --------------------------------------------------------------------------------   Toni Hill  MRN: [id number]  PRIMARY CARE:  Armanda Heritage, NP  DOB: May 30, 1946, 71 year old Female  REFERRING:  Brenn Deziel I. Gaynelle Arabian, MD   PROVIDER:  Carolan Clines, M.D.    TREATING:  Azucena Fallen    LOCATION:  [facility] [phone]   --------------------------------------------------------------------------------   CC: I urinate too frequently in the daytime.  HPI: Toni Hill is a 71 year-old female established patient who is here for urinary frequency.  [name] presents today with complaints of dysuria, bladder pressure, vaginal burning, and vaginal itching. She also complains of increased frequency, urgency, and urge incontinence. She has been placed on several antibiotics recently for URI. She was placed on Levaquin most recently, 10/08/16, and completed the course. She states that her urinary symptoms started when she began her Levaquin. She denies any fevers, gross hematuria, or flank pain. She is very adamant about wanting me to prescribe a Z-Pack and Prednisone for her URI today.    She does urinate more frequently than once every 4 hours in the daytime.   She does not have to strain or bear down to start her urinary stream. She does not have an abnormal sensation when needing to urinate.   She is having problems with urinary control or incontinence.     ALLERGIES: Penicillin - Hives    MEDICATIONS: Macrodantin 50 mg capsule 1 capsule PO Daily  Myrbetriq 50 mg tablet, extended release 24 hr  Omeprazole 20 mg tablet, delayed release  Simvastatin 40 mg tablet  Calcium 600-Vit D3  Cephalexin 250 mg capsule  Iron  Synthroid 137 mcg tablet  Vitamin D3 1,000 unit capsule  Zetia 10 mg tablet     GU PSH: Complex cystometrogram, w/ void pressure and urethral pressure profile studies, any technique - 09/28/2016 Complex  Uroflow - 09/28/2016 Emg surf Electrd - 09/28/2016 Hysterectomy Inject For cystogram - 09/28/2016 Intrabd voidng Press - 09/28/2016    NON-GU PSH: Cholecystectomy (laparoscopic) Hernia Repair    GU PMH: Urinary Urgency - 10/12/2016, - 09/20/2016 Nocturia - 09/28/2016 Urge incontinence - 09/28/2016 Stage 3 CKD (GFR 30-60)    NON-GU PMH: Arrhythmia GERD Hypercholesterolemia Hypertension Personal history of other endocrine, nutritional and metabolic disease Personal history of other mental and behavioral disorders    FAMILY HISTORY: Cancer - Father, Sister, Mother Deceased - Father, Mother Heart Disease - Runs in Family   SOCIAL HISTORY: Does not pertain to patient.      Notes: 1 son, 1 daughter   REVIEW OF SYSTEMS:    GU Review Female:   Patient reports frequent urination, hard to postpone urination, burning /pain with urination, get up at night to urinate, and leakage of urine. Patient denies stream starts and stops, trouble starting your stream, have to strain to urinate, and currently pregnant.  Gastrointestinal (Upper):   Patient reports nausea and indigestion/ heartburn. Patient denies vomiting.  Gastrointestinal (Lower):   Patient reports constipation. Patient denies diarrhea.  Constitutional:   Patient denies fever, night sweats, weight loss, and fatigue.  Skin:   Patient reports itching. Patient denies skin rash/ lesion.  Eyes:   Patient reports blurred vision. Patient denies double vision.  Ears/ Nose/ Throat:   Patient reports sinus problems. Patient denies sore throat.  Hematologic/Lymphatic:   Patient denies swollen glands and easy bruising.  Cardiovascular:   Patient reports leg swelling. Patient denies chest pains.  Respiratory:   Patient  reports cough and shortness of breath.   Endocrine:   Patient reports excessive thirst.   Musculoskeletal:   Patient reports back pain and joint pain.   Neurological:   Patient denies headaches and dizziness.  Psychologic:    Patient reports depression and anxiety.    VITAL SIGNS:      10/22/2016 01:38 PM  BP 156/87 mmHg  Pulse 97 /min  Temperature 97.1 F / 36 C  Notes: Advised to reassess BP later today.    GU PHYSICAL EXAMINATION:    External Genitalia: No hirsuitism, no rash, no scarring, no cyst, no erythematous lesion, no papular lesion, no blanched lesion, no warty lesion, no labial adhesions, no atrophic introitus. No edema. Erythemia and irritation oberved to labia.   Vagina: No atrophy, no stenosis. No rectocele. No cystocele. No enterocele. Erythema and mucosal irritation observed, no obvious vaginal discharge observed.    MULTI-SYSTEM PHYSICAL EXAMINATION:    Constitutional: Well-nourished. No physical deformities. Normally developed. Good grooming. She is very tearful and upset during exam today.   Respiratory: No labored breathing, no use of accessory muscles. Minimal wheezing heard in right and left upper lobe.   Cardiovascular: Normal temperature, bilateral lower extremity swelling with right > left, no varicosities, RRR.   Skin: No paleness, no jaundice, no cyanosis. No lesion, no ulcer, no rash.  Neurologic / Psychiatric: Patient anxious. Oriented to time, oriented to place, oriented to person. No depression, no agitation.   Gastrointestinal: No mass, no tenderness, no rigidity, non obese abdomen.  Musculoskeletal: Spine, ribs, pelvis no bilateral tenderness. Normal gait and station of head and neck.     PAST DATA REVIEWED:  Source Of History:  Patient  Records Review:   Previous Patient Records  Urine Test Review:   Urinalysis  Urodynamics Review:   Review Urodynamics Tests   PROCEDURES:           PVR Ultrasound - 26378  Scanned Volume: 129 cc         Urinalysis w/Scope Dipstick Dipstick Cont'd Micro  Color: Amber Bilirubin: Neg WBC/hpf: 0 - 5/hpf  Appearance: Cloudy Ketones: Trace RBC/hpf: 0 - 2/hpf  Specific Gravity: 1.025 Blood: Neg Bacteria: Mod (26-50/hpf)  pH: <=5.0  Protein: Neg Cystals: NS (Not Seen)  Glucose: Neg Urobilinogen: 1.0 Casts: NS (Not Seen)    Nitrites: Neg Trichomonas: Not Present    Leukocyte Esterase: Neg Mucous: Present      Epithelial Cells: 6 - 10/hpf      Yeast: NS (Not Seen)      Sperm: Not Present    ASSESSMENT:      ICD-10 Details  1 GU:   Urinary Frequency - R35.0   2   Candidiasis of vulva and vagina - B37.3    PLAN:            Medications New Meds: Diflucan 100 mg tablet 1 tablet PO Daily   #3  0 Refill(s)    Stop Meds: Levaquin 500 mg tablet  Discontinue: 10/22/2016  - Reason: The medication cycle was completed.            Orders Labs Urine Culture          Schedule Return Visit/Planned Activity: Other See Visit Notes          Document Letter(s):  Created for Patient: Clinical Summary         Notes:   UA today is not concerning for infection, but I will send urine for culture. If culture is positive, I  will treat for infection accordingly. Given recent antibiotic therapy and clinical presentation, I feel that her symptoms are likely related to a vaginal yeast infection. I will prescribe Diflucan today. We discussed indications and risks of this medication. PVR today is elevated when compared to during UDS. I feel this may likely be related to discomfort. I recommended that she practice double voiding at this time. I encouraged adequate hydration and avoidance of bladder irritants. Looking over her history, her other LUTS appear to be about the same as previously documented. She is planning to undergo Botox therapy with Dr. Gaynelle Arabian. Our office will be in touch regarding that matter. I recommended that she follow up with her PCP or at urgent care regarding her continued cough, as I will not prescribe antimicrobial therapy or prednisone for her today given that I am unfamiliar with her full medical history. She may follow up sooner, if needed, with progressive symptoms.     Signed by Azucena Fallen on 10/25/16 at  10:05 AM (EDT)    The information contained in this medical record document is considered private and confidential patient information. This information can only be used for the medical diagnosis and/or medical services that are being provided by the patient's selected caregivers. This information can only be distributed outside of the patient's care if the patient agrees and signs waivers of authorization for this information to be sent to an outside source or route.

## 2016-10-29 NOTE — Transfer of Care (Signed)
Immediate Anesthesia Transfer of Care Note  Patient: Toni Hill  Procedure(s) Performed: Procedure(s): CYSTOSCOPY WITH INJECTION BOTOX 100 UNITS, urethral dilation (N/A)  Patient Location: PACU  Anesthesia Type:General  Level of Consciousness: awake and sedated  Airway & Oxygen Therapy: Patient Spontanous Breathing and Patient connected to nasal cannula oxygen  Post-op Assessment: Report given to RN and Post -op Vital signs reviewed and stable  Post vital signs: Reviewed and stable  Last Vitals:124/59,  80, 18, 97%, 97.6 Vitals:   10/29/16 0741  BP: (!) 157/68  Pulse: 96  Resp: 16  Temp: 36.4 C    Last Pain:  Vitals:   10/29/16 0741  TempSrc: Oral      Patients Stated Pain Goal: 6 (97/53/00 5110)  Complications: No apparent anesthesia complications

## 2016-10-29 NOTE — Discharge Instructions (Addendum)
°  Post Anesthesia Home Care Instructions  Activity: Get plenty of rest for the remainder of the day. A responsible individual must stay with you for 24 hours following the procedure.  For the next 24 hours, DO NOT: -Drive a car -Paediatric nurse -Drink alcoholic beverages -Take any medication unless instructed by your physician -Make any legal decisions or sign important papers.  Meals: Start with liquid foods such as gelatin or soup. Progress to regular foods as tolerated. Avoid greasy, spicy, heavy foods. If nausea and/or vomiting occur, drink only clear liquids until the nausea and/or vomiting subsides. Call your physician if vomiting continues.  Special Instructions/Symptoms: Your throat may feel dry or sore from the anesthesia or the breathing tube placed in your throat during surgery. If this causes discomfort, gargle with warm salt water. The discomfort should disappear within 24 hours.  If you had a scopolamine patch placed behind your ear for the management of post- operative nausea and/or vomiting:  1. The medication in the patch is effective for 72 hours, after which it should be removed.  Wrap patch in a tissue and discard in the trash. Wash hands thoroughly with soap and water. 2. You may remove the patch earlier than 72 hours if you experience unpleasant side effects which may include dry mouth, dizziness or visual disturbances. 3. Avoid touching the patch. Wash your hands with soap and water after contact with the patch.   CYSTOSCOPY HOME CARE INSTRUCTIONS  Activity: Rest for the remainder of the day.  Do not drive or operate equipment today.  You may resume normal activities in one to two days as instructed by your physician.   Meals: Drink plenty of liquids and eat light foods such as gelatin or soup this evening.  You may return to a normal meal plan tomorrow.  Return to Work: You may return to work in one to two days or as instructed by your physician.  Special  Instructions / Symptoms:  Botox may begin to work within 24 hrs, but may  take up to 3 weeks to be effective. Call if you develop tremors, fever > 101, chills, urinary burning or bleeding.  Call your physician if any of these symptoms occur:   -persistent or heavy bleeding  -bleeding which continues after first few urination  -large blood clots that are difficult to pass  -urine stream diminishes or stops completely  -fever equal to or higher than 101 degrees Farenheit.  -cloudy urine with a strong, foul odor  -severe pain  Females should always wipe from front to back after elimination.  You may feel some burning pain when you urinate.  This should disappear with time.  Applying moist heat to the lower abdomen or a hot tub bath may help relieve the pain. \   Patient Signature:  ________________________________________________________  Nurse's Signature:  ________________________________________________________

## 2016-10-29 NOTE — Interval H&P Note (Signed)
History and Physical Interval Note:  10/29/2016 8:50 AM  Toni Hill  has presented today for surgery, with the diagnosis of OVER ACTIVE BLADDER  The various methods of treatment have been discussed with the patient and family. After consideration of risks, benefits and other options for treatment, the patient has consented to  Procedure(s): CYSTOSCOPY WITH INJECTION BOTOX 100 UNITS (N/A) as a surgical intervention .  The patient's history has been reviewed, patient examined, no change in status, stable for surgery.  I have reviewed the patient's chart and labs.  Questions were answered to the patient's satisfaction.     Nikitha Mode I Violette Morneault

## 2016-10-29 NOTE — Anesthesia Preprocedure Evaluation (Signed)
Anesthesia Evaluation  Patient identified by MRN, date of birth, ID band Patient awake    Reviewed: Allergy & Precautions, NPO status , Patient's Chart, lab work & pertinent test results  Airway Mallampati: II  TM Distance: >3 FB Neck ROM: Full    Dental no notable dental hx.    Pulmonary neg pulmonary ROS,    Pulmonary exam normal breath sounds clear to auscultation       Cardiovascular hypertension, Normal cardiovascular exam Rhythm:Regular Rate:Normal     Neuro/Psych negative neurological ROS  negative psych ROS   GI/Hepatic negative GI ROS, Neg liver ROS,   Endo/Other  diabetes, Insulin DependentHypothyroidism   Renal/GU Renal InsufficiencyRenal disease  negative genitourinary   Musculoskeletal negative musculoskeletal ROS (+)   Abdominal   Peds negative pediatric ROS (+)  Hematology negative hematology ROS (+)   Anesthesia Other Findings   Reproductive/Obstetrics negative OB ROS                             Anesthesia Physical Anesthesia Plan  ASA: III  Anesthesia Plan: General   Post-op Pain Management:    Induction: Intravenous  Airway Management Planned: LMA  Additional Equipment:   Intra-op Plan:   Post-operative Plan: Extubation in OR  Informed Consent: I have reviewed the patients History and Physical, chart, labs and discussed the procedure including the risks, benefits and alternatives for the proposed anesthesia with the patient or authorized representative who has indicated his/her understanding and acceptance.   Dental advisory given  Plan Discussed with: CRNA and Surgeon  Anesthesia Plan Comments:         Anesthesia Quick Evaluation

## 2016-10-29 NOTE — Anesthesia Postprocedure Evaluation (Addendum)
Anesthesia Post Note  Patient: Toni Hill  Procedure(s) Performed: Procedure(s) (LRB): CYSTOSCOPY WITH INJECTION BOTOX 100 UNITS, urethral dilation (N/A)  Patient location during evaluation: PACU Anesthesia Type: General Level of consciousness: awake and alert Pain management: pain level controlled Vital Signs Assessment: post-procedure vital signs reviewed and stable Respiratory status: spontaneous breathing, nonlabored ventilation, respiratory function stable and patient connected to nasal cannula oxygen Cardiovascular status: blood pressure returned to baseline and stable Postop Assessment: no signs of nausea or vomiting Anesthetic complications: no       Last Vitals:  Vitals:   10/29/16 1045 10/29/16 1100  BP: (!) 151/69 (!) 165/62  Pulse: 78 80  Resp: (!) 25 (!) 22  Temp:      Last Pain:  Vitals:   10/29/16 1130  TempSrc:   PainSc: 0-No pain                 Kewan Mcnease S

## 2016-11-01 ENCOUNTER — Encounter (HOSPITAL_BASED_OUTPATIENT_CLINIC_OR_DEPARTMENT_OTHER): Payer: Self-pay | Admitting: Urology

## 2016-11-20 ENCOUNTER — Emergency Department (HOSPITAL_COMMUNITY)
Admission: EM | Admit: 2016-11-20 | Discharge: 2016-11-20 | Disposition: A | Discharge status: Home / Self Care | Payer: Medicare Other | Attending: Emergency Medicine | Admitting: Emergency Medicine

## 2016-11-20 ENCOUNTER — Encounter (HOSPITAL_COMMUNITY): Payer: Self-pay | Admitting: Emergency Medicine

## 2016-11-20 ENCOUNTER — Emergency Department (HOSPITAL_COMMUNITY): Payer: Medicare Other

## 2016-11-20 DIAGNOSIS — N132 Hydronephrosis with renal and ureteral calculous obstruction: Secondary | ICD-10-CM | POA: Insufficient documentation

## 2016-11-20 DIAGNOSIS — Z79899 Other long term (current) drug therapy: Secondary | ICD-10-CM | POA: Insufficient documentation

## 2016-11-20 DIAGNOSIS — E039 Hypothyroidism, unspecified: Secondary | ICD-10-CM | POA: Insufficient documentation

## 2016-11-20 DIAGNOSIS — N200 Calculus of kidney: Secondary | ICD-10-CM

## 2016-11-20 DIAGNOSIS — E1122 Type 2 diabetes mellitus with diabetic chronic kidney disease: Secondary | ICD-10-CM | POA: Insufficient documentation

## 2016-11-20 DIAGNOSIS — Z794 Long term (current) use of insulin: Secondary | ICD-10-CM | POA: Insufficient documentation

## 2016-11-20 DIAGNOSIS — R1030 Lower abdominal pain, unspecified: Secondary | ICD-10-CM | POA: Diagnosis present

## 2016-11-20 DIAGNOSIS — I129 Hypertensive chronic kidney disease with stage 1 through stage 4 chronic kidney disease, or unspecified chronic kidney disease: Secondary | ICD-10-CM | POA: Diagnosis not present

## 2016-11-20 DIAGNOSIS — N183 Chronic kidney disease, stage 3 (moderate): Secondary | ICD-10-CM | POA: Insufficient documentation

## 2016-11-20 DIAGNOSIS — R109 Unspecified abdominal pain: Secondary | ICD-10-CM

## 2016-11-20 LAB — CBC WITH DIFFERENTIAL/PLATELET
Basophils Absolute: 0 10*3/uL (ref 0.0–0.1)
Basophils Relative: 0 %
Eosinophils Absolute: 0 10*3/uL (ref 0.0–0.7)
Eosinophils Relative: 0 %
HCT: 39 % (ref 36.0–46.0)
Hemoglobin: 12.8 g/dL (ref 12.0–15.0)
Lymphocytes Relative: 13 %
Lymphs Abs: 1.5 10*3/uL (ref 0.7–4.0)
MCH: 31.8 pg (ref 26.0–34.0)
MCHC: 32.8 g/dL (ref 30.0–36.0)
MCV: 96.8 fL (ref 78.0–100.0)
Monocytes Absolute: 0.8 10*3/uL (ref 0.1–1.0)
Monocytes Relative: 7 %
Neutro Abs: 9.4 10*3/uL — ABNORMAL HIGH (ref 1.7–7.7)
Neutrophils Relative %: 80 %
Platelets: 283 10*3/uL (ref 150–400)
RBC: 4.03 MIL/uL (ref 3.87–5.11)
RDW: 14.3 % (ref 11.5–15.5)
WBC: 11.7 10*3/uL — ABNORMAL HIGH (ref 4.0–10.5)

## 2016-11-20 LAB — COMPREHENSIVE METABOLIC PANEL
ALT: 15 U/L (ref 14–54)
AST: 23 U/L (ref 15–41)
Albumin: 3.1 g/dL — ABNORMAL LOW (ref 3.5–5.0)
Alkaline Phosphatase: 68 U/L (ref 38–126)
Anion gap: 8 (ref 5–15)
BUN: 34 mg/dL — ABNORMAL HIGH (ref 6–20)
CO2: 23 mmol/L (ref 22–32)
Calcium: 9 mg/dL (ref 8.9–10.3)
Chloride: 107 mmol/L (ref 101–111)
Creatinine, Ser: 1.59 mg/dL — ABNORMAL HIGH (ref 0.44–1.00)
GFR calc Af Amer: 37 mL/min — ABNORMAL LOW (ref >60)
GFR calc non Af Amer: 32 mL/min — ABNORMAL LOW (ref >60)
Glucose, Bld: 163 mg/dL — ABNORMAL HIGH (ref 65–99)
Potassium: 3.9 mmol/L (ref 3.5–5.1)
Sodium: 138 mmol/L (ref 135–145)
Total Bilirubin: 0.9 mg/dL (ref 0.3–1.2)
Total Protein: 5.8 g/dL — ABNORMAL LOW (ref 6.5–8.1)

## 2016-11-20 LAB — URINALYSIS, ROUTINE W REFLEX MICROSCOPIC
Bilirubin Urine: NEGATIVE
Glucose, UA: NEGATIVE mg/dL
Hgb urine dipstick: NEGATIVE
Ketones, ur: 20 mg/dL — AB
Nitrite: NEGATIVE
Protein, ur: NEGATIVE mg/dL
Specific Gravity, Urine: 1.023 (ref 1.005–1.030)
pH: 5 (ref 5.0–8.0)

## 2016-11-20 LAB — I-STAT CREATININE, ED: Creatinine, Ser: 1.7 mg/dL — ABNORMAL HIGH (ref 0.44–1.00)

## 2016-11-20 MED ORDER — FENTANYL CITRATE (PF) 100 MCG/2ML IJ SOLN
50.0000 ug | Freq: Once | INTRAMUSCULAR | Status: AC
  Administered 2016-11-20: 50 ug via INTRAVENOUS
  Filled 2016-11-20: qty 2

## 2016-11-20 MED ORDER — ONDANSETRON 8 MG PO TBDP
8.0000 mg | ORAL_TABLET | Freq: Once | ORAL | Status: AC
  Administered 2016-11-20: 8 mg via ORAL
  Filled 2016-11-20: qty 1

## 2016-11-20 MED ORDER — HYDROCODONE-ACETAMINOPHEN 5-325 MG PO TABS: 1.0000 | ORAL_TABLET | Freq: Four times a day (QID) | ORAL | 0 refills | Status: DC | PRN

## 2016-11-20 MED ORDER — HYDROMORPHONE HCL 1 MG/ML IJ SOLN
1.0000 mg | Freq: Once | INTRAMUSCULAR | Status: AC
  Administered 2016-11-20: 1 mg via INTRAVENOUS
  Filled 2016-11-20: qty 1

## 2016-11-20 MED ORDER — IOPAMIDOL (ISOVUE-300) INJECTION 61%
INTRAVENOUS | Status: AC
  Administered 2016-11-20: 30 mL
  Filled 2016-11-20: qty 30

## 2016-11-20 NOTE — ED Provider Notes (Signed)
[facility] DEPT Provider Note   CSN: [id number] Arrival date & time: 11/20/16  0737     History   Chief Complaint Chief Complaint  Patient presents with  . Urinary Retention    HPI Toni Hill is a 71 y.o. female.  HPI She presents with concern of new suprapubic pain, discomfort, diminished urine production. Patient has a notable history of Botox injection 2 weeks ago with urology due to labile urinary pattern. She notes that since that time she has required substantial effort to urinate, but no similar absence of urination to today's event. She notes that for the past 8 hours she has had no urine production at all, has had concurrent increase in her suprapubic pain. Mild nausea, no vomiting, no fever, no chills. Patient is here with her daughter who assists with the history of present illness.  Past Medical History:  Diagnosis Date  . Bilateral lower extremity edema    right > left  . Bipolar 1 disorder ([place])   . CKD (chronic kidney disease) stage 3, GFR 30-59 ml/min    nephrologist-  [facility] nephrology in [facility]  . Diabetes, polyneuropathy ([place])    FEET AND FINGER TIPS  . Fibromyalgia   . GERD (gastroesophageal reflux disease)   . History of chronic gastritis   . Hyperlipidemia   . Hypertension    per pt was take off bp medication because of kidney disease told by her nephrologist  . Hypothyroidism   . Mixed incontinence urge and stress   . OA (osteoarthritis)    KNEES  . OAB (overactive bladder)   . PONV (postoperative nausea and vomiting)   . 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 Type 2 diabetes mellitus treated with insulin ([place])   . Wears glasses     There are no active problems to display for this patient.   Past Surgical History:  Procedure Laterality Date  . CHOLECYSTECTOMY OPEN  1990's  . COLONOSCOPY  last one 07-17-2014  . CYSTOSCOPY WITH INJECTION N/A 10/29/2016   Procedure: CYSTOSCOPY WITH INJECTION BOTOX 100 UNITS, urethral  dilation;  Surgeon: Carolan Clines, MD;  Location: [facility];  Service: Urology;  Laterality: N/A;  . ESOPHAGOGASTRODUODENOSCOPY  last one 07-01-2016  . EXCISIONAL BREAST BX  1990   BENIGN  . ORIF TIBIA & FIBULA FRACTURES  2010   retained rod  . TONSILLECTOMY  age 59  . VAGINAL HYSTERECTOMY  1979  . VENTRAL HERNIA REPAIR  1990's    OB History    No data available       Home Medications    Prior to Admission medications   Medication Sig Start Date End Date Taking? Authorizing Provider  Calcium Carb-Cholecalciferol (CALCIUM 600/VITAMIN D3) 600-800 MG-UNIT TABS Take 1 tablet by mouth daily.    [provider]  Cholecalciferol (EQL VITAMIN D3) 1000 units tablet Take 1,000 Units by mouth daily.    [provider]  ezetimibe (ZETIA) 10 MG tablet Take 10 mg by mouth every evening.     [provider]  Ferrous Gluconate (IRON 27 PO) Take 1 tablet by mouth every morning.     [provider]  insulin aspart (NOVOLOG) 100 UNIT/ML injection Inject 10 Units into the skin 3 (three) times daily.    [provider]  Insulin Glargine (LANTUS SOLOSTAR) 100 UNIT/ML Solostar Pen Inject 40 Units into the skin at bedtime. 12/16/12   [provider]  levothyroxine (SYNTHROID, LEVOTHROID) 137 MCG tablet  Take 137 mcg by mouth daily before breakfast.    [provider]  LORazepam (ATIVAN) 1 MG tablet Take 0.5-1 mg by mouth daily as needed for anxiety. 06/01/16   [provider]  mirabegron ER (MYRBETRIQ) 50 MG TB24 tablet Take 50 mg by mouth every morning.     [provider]  omeprazole (PRILOSEC) 20 MG capsule Take 20 mg by mouth 2 (two) times daily before a meal.     [provider]  simvastatin (ZOCOR) 40 MG tablet Take 40 mg by mouth every evening.     [provider]    Family History History reviewed. No pertinent family history.  Social History Social History  Substance Use  Topics  . Smoking status: Never Smoker  . Smokeless tobacco: Never Used  . Alcohol use No     Allergies   Vioxx [rofecoxib] and Penicillins   Review of Systems Review of Systems  Constitutional:       Per HPI, otherwise negative  HENT:       Per HPI, otherwise negative  Respiratory:       Per HPI, otherwise negative  Cardiovascular:       Per HPI, otherwise negative  Gastrointestinal: Negative for vomiting.  Endocrine:       Negative aside from HPI  Genitourinary:       Neg aside from HPI   Musculoskeletal:       Per HPI, otherwise negative  Skin: Negative.   Neurological: Negative for syncope.     Physical Exam Updated Vital Signs BP (!) 152/68   Pulse 91   Temp 97.5 F (36.4 C) (Oral)   Resp 16   SpO2 95%   Physical Exam  Constitutional: She is oriented to person, place, and time. She appears well-developed and well-nourished. No distress.  Uncomfortable appearing elderly F  HENT:  Head: Normocephalic and atraumatic.  Eyes: Conjunctivae and EOM are normal.  Cardiovascular: Normal rate and regular rhythm.   Pulmonary/Chest: Effort normal and breath sounds normal. No stridor. No respiratory distress.  Abdominal: She exhibits no distension. There is tenderness in the suprapubic area. There is guarding.  Musculoskeletal: She exhibits no edema.  Neurological: She is alert and oriented to person, place, and time. No cranial nerve deficit.  Skin: Skin is warm and dry.  Psychiatric: She has a normal mood and affect.  Nursing note and vitals reviewed.    ED Treatments / Results  Labs (all labs ordered are listed, but only abnormal results are displayed) Labs Reviewed  URINALYSIS, ROUTINE W REFLEX MICROSCOPIC - Abnormal; Notable for the following:       Result Value   APPearance CLOUDY (*)    Ketones, ur 20 (*)    Leukocytes, UA TRACE (*)    Bacteria, UA RARE (*)    Squamous Epithelial / LPF 6-30 (*)    All other components within normal limits  CBC WITH  DIFFERENTIAL/PLATELET - Abnormal; Notable for the following:    WBC 11.7 (*)    Neutro Abs 9.4 (*)    All other components within normal limits  COMPREHENSIVE METABOLIC PANEL - Abnormal; Notable for the following:    Glucose, Bld 163 (*)    BUN 34 (*)    Creatinine, Ser 1.59 (*)    Total Protein 5.8 (*)    Albumin 3.1 (*)    GFR calc non Af Amer 32 (*)    GFR calc Af Amer 37 (*)    All other components within  normal limits  I-STAT CREATININE, ED - Abnormal; Notable for the following:    Creatinine, Ser 1.70 (*)    All other components within normal limits     Radiology Ct Abdomen Pelvis Wo Contrast  Result Date: 11/20/2016 CLINICAL DATA:  Pt reports she has been unable to urinate since last night. Had botox injection 2 weeks ago in urethral area. EXAM: CT ABDOMEN AND PELVIS WITHOUT CONTRAST TECHNIQUE: Multidetector CT imaging of the abdomen and pelvis was performed following the standard protocol without IV contrast. COMPARISON:  07/06/2014 FINDINGS: Lower chest: No acute abnormality. Hepatobiliary: No focal liver abnormality is seen. Status post cholecystectomy. No biliary dilatation. Pancreas: Unremarkable. No pancreatic ductal dilatation or surrounding inflammatory changes. Spleen: Normal in size without focal abnormality. Adrenals/Urinary Tract: Stable 2 cm left adrenal adenoma. Normal right adrenal gland. Bilateral renal cortical thinning. Water density posterior left upper pole renal mass consistent with a cyst, mildly increased in size the prior study, now measuring 2 cm. Multiple small hyperattenuating lesions that are likely cysts complicated by previous hemorrhage or proteinaceous content. Small nonobstructing stones in the midpole and lower pole of the left kidney. There is mild left hydronephrosis. Mild dilation of the left ureter. A 1-2 mm stone projects at the left ureterovesicular junction. There is also left perinephric stranding that is new since the prior exam. Bladder is  decompressed by Foley catheter. No right hydronephrosis. Normal right ureter. Stomach/Bowel: Stomach and small bowel unremarkable. There are left colon diverticula. No diverticulitis. No other colon abnormality. A short normal diameter appendix is noted with no inflammation. Vascular/Lymphatic: Aortic atherosclerosis. No enlarged abdominal or pelvic lymph nodes. Reproductive: Status post hysterectomy. No adnexal masses. Other: No abdominal wall hernia or abnormality. No abdominopelvic ascites. Musculoskeletal: No acute fracture. No osteoblastic or osteolytic lesions. IMPRESSION: 1. 1-2 mm stone at the left ureterovesicular junction causes mild left hydroureteronephrosis and a left perinephric stranding. 2. No other acute findings. 3. Small nonobstructing stones in the left kidney. 4. Left colon diverticula without diverticulitis. Electronically Signed   By: Lajean Manes M.D.   On: 11/20/2016 11:54    Procedures Procedures (including critical care time)  Medications Ordered in ED Medications  fentaNYL (SUBLIMAZE) injection 50 mcg (50 mcg Intravenous Given 11/20/16 0919)  HYDROmorphone (DILAUDID) injection 1 mg (1 mg Intravenous Given 11/20/16 0958)  iopamidol (ISOVUE-300) 61 % injection (30 mLs  Contrast Given 11/20/16 1059)     Initial Impression / Assessment and Plan / ED Course  I have reviewed the triage vital signs and the nursing notes.  Pertinent labs & imaging results that were available during my care of the patient were reviewed by me and considered in my medical decision making (see chart for details).  Update After the initial placement of the Foley catheter patient had urine production that was not consistent with acute bladder outlet obstruction she also describes pain in the left lower quadrant, and CT scans ordered.  Update:, Now, after initial sentinel was followed with Dilaudid, the patient has substantial pain reduction.   12:28 PM Patient is still in no distress. I  discussed all findings with her. With new finding of left UVJ stone, there suspicion for this is contributing to her ongoing pain, as well as possible bladder spasm resulting in sensation of urinary urge, but without actual obstruction. Patient is substantially better, has no fever, no evidence for bacteremia, sepsis, and the stone is not obviously infected, nor obstructing. Patient has upcoming follow-up with urology, was encouraged to move this appointment  forward. With resolution of pain, the patient is appropriate for discharge with close outpatient follow-up.   Final Clinical Impressions(s) / ED Diagnoses  Kidney stone   Carmin Muskrat, MD 11/20/16 1229

## 2016-11-20 NOTE — ED Triage Notes (Signed)
Pt reports she has been unable to urinate since last night. Had botox injection 2 weeks ago in urethral area.

## 2016-11-20 NOTE — Discharge Instructions (Signed)
As discussed, your evaluation today has been largely reassuring.  But, it is important that you monitor your condition carefully, and do not hesitate to return to the ED if you develop new, or concerning changes in your condition. ? ?Otherwise, please follow-up with your physician for appropriate ongoing care. ? ?

## 2016-11-21 LAB — URINE CULTURE: Culture: NO GROWTH

## 2016-12-06 NOTE — Addendum Note (Signed)
Addendum  created 12/06/16 1353 by Myrtie Soman, MD   Sign clinical note

## 2018-09-20 ENCOUNTER — Ambulatory Visit (INDEPENDENT_AMBULATORY_CARE_PROVIDER_SITE_OTHER): Payer: Medicare Other

## 2018-09-20 ENCOUNTER — Ambulatory Visit (INDEPENDENT_AMBULATORY_CARE_PROVIDER_SITE_OTHER): Payer: Medicare Other | Admitting: Orthopaedic Surgery

## 2018-09-20 ENCOUNTER — Other Ambulatory Visit: Payer: Self-pay

## 2018-09-20 ENCOUNTER — Encounter (INDEPENDENT_AMBULATORY_CARE_PROVIDER_SITE_OTHER): Payer: Self-pay | Admitting: Orthopaedic Surgery

## 2018-09-20 DIAGNOSIS — M25551 Pain in right hip: Secondary | ICD-10-CM | POA: Diagnosis not present

## 2018-09-20 DIAGNOSIS — M5441 Lumbago with sciatica, right side: Secondary | ICD-10-CM

## 2018-09-20 DIAGNOSIS — M5416 Radiculopathy, lumbar region: Secondary | ICD-10-CM

## 2018-09-20 DIAGNOSIS — G8929 Other chronic pain: Secondary | ICD-10-CM | POA: Diagnosis not present

## 2018-09-20 DIAGNOSIS — N184 Chronic kidney disease, stage 4 (severe): Secondary | ICD-10-CM

## 2018-09-20 MED ORDER — PREDNISONE 10 MG (21) PO TBPK: ORAL_TABLET | ORAL | 0 refills | Status: DC

## 2018-09-20 MED ORDER — GABAPENTIN 100 MG PO CAPS
100.0000 mg | ORAL_CAPSULE | Freq: Three times a day (TID) | ORAL | 3 refills | Status: DC | PRN
Start: 2018-09-20 — End: 2018-12-03

## 2018-09-20 MED ORDER — HYDROCODONE-ACETAMINOPHEN 5-325 MG PO TABS: 1.0000 | ORAL_TABLET | Freq: Every day | ORAL | 0 refills | Status: DC | PRN

## 2018-09-20 MED ORDER — METHOCARBAMOL 750 MG PO TABS: 750.0000 mg | ORAL_TABLET | Freq: Two times a day (BID) | ORAL | 0 refills | Status: DC | PRN

## 2018-09-20 NOTE — Progress Notes (Signed)
Office Visit Note   Patient: Toni Hill           Date of Birth: [birth year]           MRN: [id number] Visit Date: 09/20/2018              Requested by: Toni Axe, MD [address] [place], [place] [address] PCP: Toni Axe, MD   Assessment & Plan: Visit Diagnoses:  1. Chronic right-sided low back pain with right-sided sciatica   2. Pain in right hip     Plan: Impression is low back pain and lumbar radiculopathy.  Patient had stage IV chronic kidney disease therefore cannot take NSAIDs.  We will prescribe gabapentin, Robaxin, prednisone taper, small supply of Norco.  Questions encouraged and answered.  Follow-up as needed.  Follow-Up Instructions: Return if symptoms worsen or fail to improve.   Orders:  Orders Placed This Encounter  Procedures  . XR HIP UNILAT W OR W/O PELVIS 2-3 VIEWS RIGHT  . XR Lumbar Spine 2-3 Views   Meds ordered this encounter  Medications  . gabapentin (NEURONTIN) 100 MG capsule    Sig: Take 1-3 capsules (100-300 mg total) by mouth 3 (three) times daily as needed.    Dispense:  60 capsule    Refill:  3  . methocarbamol (ROBAXIN) 750 MG tablet    Sig: Take 1 tablet (750 mg total) by mouth 2 (two) times daily as needed for muscle spasms.    Dispense:  60 tablet    Refill:  0  . predniSONE (STERAPRED UNI-PAK 21 TAB) 10 MG (21) TBPK tablet    Sig: Take as directed    Dispense:  21 tablet    Refill:  0  . HYDROcodone-acetaminophen (NORCO) 5-325 MG tablet    Sig: Take 1-2 tablets by mouth daily as needed.    Dispense:  20 tablet    Refill:  0      Procedures: No procedures performed   Clinical Data: No additional findings.   Subjective: Chief Complaint  Patient presents with  . Right Hip - Injury    S/p fall  . Lower Back - Pain    Toni Hill is a 73 year old female comes in with severe right hip and back pain that recently got worse when she fell yesterday.  She states that her pain starts in her low back on the right side and  radiates down into the thigh to just below the knee.  She denies any groin pain.  This was exacerbated by her fall yesterday.  She is having severe pain with ambulation and weightbearing.  Endorses occasional numbness and tingling.  Denies any bowel or bladder dysfunction or incontinence.   Review of Systems  Constitutional: Negative.   HENT: Negative.   Eyes: Negative.   Respiratory: Negative.   Cardiovascular: Negative.   Endocrine: Negative.   Musculoskeletal: Negative.   Neurological: Negative.   Hematological: Negative.   Psychiatric/Behavioral: Negative.   All other systems reviewed and are negative.    Objective: Vital Signs: There were no vitals taken for this visit.  Physical Exam Vitals signs and nursing note reviewed.  Constitutional:      Appearance: She is well-developed.  HENT:     Head: Normocephalic and atraumatic.  Neck:     Musculoskeletal: Neck supple.  Pulmonary:     Effort: Pulmonary effort is normal.  Abdominal:     Palpations: Abdomen is soft.  Skin:    General: Skin is warm.  Capillary Refill: Capillary refill takes less than 2 seconds.  Neurological:     Mental Status: She is alert and oriented to person, place, and time.  Psychiatric:        Behavior: Behavior normal.        Thought Content: Thought content normal.        Judgment: Judgment normal.     Ortho Exam Right lower extremity exam shows no pain with hip movement.  Positive straight leg raise.  Back and lumbar spine are both tender to palpation.  Symmetric reflexes.  No focal motor or sensory deficits. Specialty Comments:  No specialty comments available.  Imaging: Xr Hip Unilat W Or W/o Pelvis 2-3 Views Right  Result Date: 09/20/2018 No acute or structural abnormalities  Xr Lumbar Spine 2-3 Views  Result Date: 09/20/2018 No acute abnormalities.  Loss of lordotic curvature.    PMFS History: There are no active problems to display for this patient.  Past Medical  History:  Diagnosis Date  . Bilateral lower extremity edema    right > left  . Bipolar 1 disorder ([place])   . CKD (chronic kidney disease) stage 3, GFR 30-59 ml/min [facility])    nephrologist-  BMI nephrology in [facility]  . Diabetes, polyneuropathy ([place])    FEET AND FINGER TIPS  . Fibromyalgia   . GERD (gastroesophageal reflux disease)   . History of chronic gastritis   . Hyperlipidemia   . Hypertension    per pt was take off bp medication because of kidney disease told by her nephrologist  . Hypothyroidism   . Mixed incontinence urge and stress   . OA (osteoarthritis)    KNEES  . OAB (overactive bladder)   . PONV (postoperative nausea and vomiting)   . 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 Type 2 diabetes mellitus treated with insulin ([place])   . Wears glasses     No family history on file.  Past Surgical History:  Procedure Laterality Date  . CHOLECYSTECTOMY OPEN  1990's  . COLONOSCOPY  last one 07-17-2014  . CYSTOSCOPY WITH INJECTION N/A 10/29/2016   Procedure: CYSTOSCOPY WITH INJECTION BOTOX 100 UNITS, urethral dilation;  Surgeon: Carolan Clines, MD;  Location: [facility];  Service: Urology;  Laterality: N/A;  . ESOPHAGOGASTRODUODENOSCOPY  last one 07-01-2016  . EXCISIONAL BREAST BX  1990   BENIGN  . ORIF TIBIA & FIBULA FRACTURES  2010   retained rod  . TONSILLECTOMY  age 28  . VAGINAL HYSTERECTOMY  1979  . VENTRAL HERNIA REPAIR  1990's   Social History   Occupational History  . Not on file  Tobacco Use  . Smoking status: Never Smoker  . Smokeless tobacco: Never Used  Substance and Sexual Activity  . Alcohol use: No  . Drug use: No  . Sexual activity: Not on file

## 2018-09-25 ENCOUNTER — Telehealth (INDEPENDENT_AMBULATORY_CARE_PROVIDER_SITE_OTHER): Payer: Self-pay | Admitting: Orthopaedic Surgery

## 2018-09-25 NOTE — Telephone Encounter (Signed)
Please advise 

## 2018-09-25 NOTE — Telephone Encounter (Signed)
Called patient to advise on message. She is aware. She would like Rx sent to [facility]. Thanks.

## 2018-09-25 NOTE — Telephone Encounter (Signed)
Rx refill Hydrocodone  [facility] [address]

## 2018-09-25 NOTE — Telephone Encounter (Signed)
We cannot keep prescribing opiate pain meds on a chronic basis.  She can have 1 more refill of #20.  If she doesn't improve, will need MRI

## 2018-09-26 MED ORDER — HYDROCODONE-ACETAMINOPHEN 5-325 MG PO TABS: 1.0000 | ORAL_TABLET | Freq: Every day | ORAL | 0 refills | Status: DC | PRN

## 2018-10-04 ENCOUNTER — Telehealth (INDEPENDENT_AMBULATORY_CARE_PROVIDER_SITE_OTHER): Payer: Self-pay | Admitting: Orthopaedic Surgery

## 2018-10-04 NOTE — Telephone Encounter (Signed)
Patient called advised the insurance company did not pay for her Rx. Patient said she had to pay the full price. Patient said she had to go into over draft of her account in order to get her medication. Patient said if the insurance company can be called for her. The number to contact patient is [phone]

## 2018-10-05 ENCOUNTER — Telehealth (INDEPENDENT_AMBULATORY_CARE_PROVIDER_SITE_OTHER): Payer: Self-pay

## 2018-10-05 NOTE — Telephone Encounter (Signed)
There's nothing we can do about the pricing of medications.

## 2018-10-05 NOTE — Telephone Encounter (Signed)
Has questions about taking Gabapentin.  Please call [phone].

## 2018-10-05 NOTE — Telephone Encounter (Signed)
See message below. I don't think we can do anything about this.

## 2018-10-05 NOTE — Telephone Encounter (Signed)
Patient aware. She will call her ins company

## 2018-10-05 NOTE — Telephone Encounter (Signed)
[facility] called needing to clarify need for robaxin. I provided with diagnosis and she stated they will get this approved for patient.

## 2018-10-05 NOTE — Telephone Encounter (Signed)
States she was given meds. Takes Gabapentin bid everyday #60, methocarbamol1 in the am and 1 in the PM, hydrocodone5-325- takes 1 AM and 1 PM  Doesn't really help. Legs just gives out. Has fallen multiple times. Legs are purple because of her falling. Feels very weak. Can hardly walk due to hip pain. Wants to know what she can do?

## 2018-10-06 ENCOUNTER — Other Ambulatory Visit (INDEPENDENT_AMBULATORY_CARE_PROVIDER_SITE_OTHER): Payer: Self-pay

## 2018-10-06 DIAGNOSIS — M5441 Lumbago with sciatica, right side: Principal | ICD-10-CM

## 2018-10-06 DIAGNOSIS — G8929 Other chronic pain: Secondary | ICD-10-CM

## 2018-10-06 NOTE — Telephone Encounter (Signed)
MRI L spine to r/o fracture asap.

## 2018-10-06 NOTE — Telephone Encounter (Signed)
Called patient to advise.  She is aware. Order made.

## 2018-10-16 ENCOUNTER — Telehealth (INDEPENDENT_AMBULATORY_CARE_PROVIDER_SITE_OTHER): Payer: Self-pay | Admitting: Orthopaedic Surgery

## 2018-10-16 NOTE — Telephone Encounter (Signed)
See message below °

## 2018-10-16 NOTE — Telephone Encounter (Signed)
What's going on with her knee?

## 2018-10-16 NOTE — Telephone Encounter (Signed)
Patient called stating that she has an MRI of her back and wants to know if Dr. Erlinda Hong will write an order to include her left knee the order of the MRI.  CB#[phone]

## 2018-10-17 NOTE — Telephone Encounter (Signed)
I called patient no answer LMOM to return call. Need to know what's going on with her knee.

## 2018-10-17 NOTE — Telephone Encounter (Signed)
Yes to rule out structural abnormalities.

## 2018-10-17 NOTE — Telephone Encounter (Signed)
Patient called back and stated that her left knee has been hurting and she has had cortisone injections previously in that knee.  She wants to know if she could get an MRI of that knee.  She had the shots at a [facility].  CB#[phone].  Thank you.

## 2018-10-17 NOTE — Telephone Encounter (Signed)
See message.

## 2018-10-18 ENCOUNTER — Other Ambulatory Visit (INDEPENDENT_AMBULATORY_CARE_PROVIDER_SITE_OTHER): Payer: Self-pay

## 2018-10-18 DIAGNOSIS — M25562 Pain in left knee: Principal | ICD-10-CM

## 2018-10-18 DIAGNOSIS — G8929 Other chronic pain: Secondary | ICD-10-CM

## 2018-10-18 NOTE — Telephone Encounter (Signed)
MRI ORDER MADE SOMEONE WILL CALL PATIENT TO SCHEDULE

## 2018-10-20 ENCOUNTER — Other Ambulatory Visit: Payer: Self-pay

## 2018-10-20 ENCOUNTER — Ambulatory Visit
Admission: RE | Admit: 2018-10-20 | Discharge: 2018-10-20 | Disposition: A | Discharge status: Home / Self Care | Payer: Medicare Other | Source: Ambulatory Visit | Attending: Orthopaedic Surgery | Admitting: Orthopaedic Surgery

## 2018-10-20 DIAGNOSIS — G8929 Other chronic pain: Secondary | ICD-10-CM

## 2018-10-20 DIAGNOSIS — M5441 Lumbago with sciatica, right side: Principal | ICD-10-CM

## 2018-10-20 NOTE — Progress Notes (Signed)
Needs f/u appt once she has her knee MRI completed.  Thanks.

## 2018-10-27 ENCOUNTER — Telehealth (INDEPENDENT_AMBULATORY_CARE_PROVIDER_SITE_OTHER): Payer: Self-pay | Admitting: Orthopaedic Surgery

## 2018-10-27 NOTE — Telephone Encounter (Signed)
Patient requesting methocarbamol because she is completely out and needs ASAP because she cannot be without it.  This is for her back and hip.  [facility] in [place].  They told her that it could not be ready until Tuesday. Patient asking if this can be expedited with a phone call.

## 2018-10-30 ENCOUNTER — Other Ambulatory Visit (INDEPENDENT_AMBULATORY_CARE_PROVIDER_SITE_OTHER): Payer: Self-pay

## 2018-10-30 MED ORDER — METHOCARBAMOL 750 MG PO TABS: 750.0000 mg | ORAL_TABLET | Freq: Two times a day (BID) | ORAL | 0 refills | Status: DC | PRN

## 2018-10-30 NOTE — Telephone Encounter (Signed)
Ok refill 60

## 2018-10-30 NOTE — Telephone Encounter (Signed)
See message below °

## 2018-10-30 NOTE — Telephone Encounter (Signed)
Called patient. No answer.  We sent in Rx into pharm.

## 2018-11-30 ENCOUNTER — Other Ambulatory Visit: Payer: Self-pay

## 2018-12-01 ENCOUNTER — Telehealth: Payer: Self-pay

## 2018-12-01 ENCOUNTER — Other Ambulatory Visit: Payer: Self-pay | Admitting: Gynecology

## 2018-12-01 ENCOUNTER — Ambulatory Visit (INDEPENDENT_AMBULATORY_CARE_PROVIDER_SITE_OTHER): Payer: Medicare Other | Admitting: Gynecology

## 2018-12-01 ENCOUNTER — Encounter: Payer: Self-pay | Admitting: Gynecology

## 2018-12-01 VITALS — BP 134/80 | Ht 69.0 in | Wt 229.0 lb

## 2018-12-01 DIAGNOSIS — Z803 Family history of malignant neoplasm of breast: Secondary | ICD-10-CM | POA: Diagnosis not present

## 2018-12-01 DIAGNOSIS — B373 Candidiasis of vulva and vagina: Secondary | ICD-10-CM | POA: Diagnosis not present

## 2018-12-01 DIAGNOSIS — Z01419 Encounter for gynecological examination (general) (routine) without abnormal findings: Secondary | ICD-10-CM

## 2018-12-01 DIAGNOSIS — N3000 Acute cystitis without hematuria: Secondary | ICD-10-CM

## 2018-12-01 DIAGNOSIS — N952 Postmenopausal atrophic vaginitis: Secondary | ICD-10-CM

## 2018-12-01 DIAGNOSIS — B3731 Acute candidiasis of vulva and vagina: Secondary | ICD-10-CM

## 2018-12-01 LAB — WET PREP FOR TRICH, YEAST, CLUE

## 2018-12-01 MED ORDER — FLUCONAZOLE 150 MG PO TABS: 150.0000 mg | ORAL_TABLET | Freq: Every day | ORAL | 0 refills | Status: DC

## 2018-12-01 MED ORDER — CIPROFLOXACIN HCL 500 MG PO TABS: 500.0000 mg | ORAL_TABLET | Freq: Two times a day (BID) | ORAL | 0 refills | Status: DC

## 2018-12-01 MED ORDER — NITROFURANTOIN MONOHYD MACRO 100 MG PO CAPS: 100.0000 mg | ORAL_CAPSULE | Freq: Two times a day (BID) | ORAL | 0 refills | Status: DC

## 2018-12-01 NOTE — Progress Notes (Signed)
Toni Hill [birth year] [id number]        72 y.o.  G2P2 patient complaining of 3 weeks of vaginal irritation, itching, discharge and odor.  Also some urinary frequency.  No dysuria, urgency, fever or chills.  Has been a number of years since her last GYN checkup.  History of hysterectomy in the past for leiomyoma.  Believes she still has her ovaries.  Past medical history,surgical history, problem list, medications, allergies, family history and social history were all reviewed and documented as reviewed in the EPIC chart.  ROS:  Performed with pertinent positives and negatives included in the history, assessment and plan.   Additional significant findings : None   Exam: Caryn Bee assistant Vitals:   12/01/18 1200  Weight: 229 lb (103.9 kg)  Height: 5\' 9"  (1.753 m)   Body mass index is 33.82 kg/m.   BP 134/80 General appearance:  Normal affect, orientation and appearance. Skin: Grossly normal HEENT: Without gross lesions.  No cervical or supraclavicular adenopathy. Thyroid normal.  Lungs:  Clear without wheezing, rales or rhonchi Cardiac: RR, without RMG Abdominal:  Soft, nontender, without masses, guarding, rebound, organomegaly or hernia Breasts:  Examined lying and sitting without masses, retractions, discharge or axillary adenopathy. Pelvic:  Ext, BUS, Vagina: With atrophic changes.  White discharge noted.  Adnexa: Without masses or tenderness    Anus and perineum: Normal   Rectovaginal: Normal sphincter tone without palpated masses or tenderness.    Assessment/Plan:  73 y.o. G2P2 female for breast and pelvic exam  1. Vaginal discharge with irritation.  Wet prep consistent with yeast.  Will treat with Diflucan 150 mg x 2 days given the extent of her symptoms.  The patient will follow-up if her symptoms persist, worsen or recur. 2. Urinary frequency.  Urine analysis consistent with UTI.  Will cover with Macrobid 100 mg twice daily x7 days.  Follow-up if symptoms  persist, worsen or recur. 3. Strong family history of breast cancer in her mother, maternal grandmother and 2 sisters.  The patient's daughter is actually undergoing genetic testing next week.  I reviewed the possibility of genetic linkage and my recommendation that the patient consider genetic testing.  If she was positive gene the potential for 80% lifetime risk of breast cancer and 20 to 40% risk of ovarian cancer also discussed.  Patient agrees with genetic counseling and knows to expect a phone call to arrange the appointment.  She will call if she does not hear from the office within several weeks.  Mammography reported 04/2017.  Commend patient schedule mammogram now and she agrees to do so.  Breast exam normal today. 4. DEXA unsure although the patient feels that she has had it recently.  She is going to follow-up with her primary just to verify when this was done and to continue to follow them for her bone health. 5. Pap smear a number of years ago.  No history of abnormal Pap smears.  We discussed current screening guidelines.  She is status post hysterectomy for leiomyoma and over the age of 40.  We both agree to stop screening and no Pap smear was done today. 6. Colonoscopy 2019.  Repeat at their recommended interval. 7. Health maintenance.  No routine lab work done as patient does this elsewhere.  Follow-up 1 year, sooner as needed.  Additional time in excess of her breast and pelvic exam was spent in direct face to face counseling and coordination of care in regards to her vaginitis and  UTI with prescriptions provided.    Anastasio Auerbach MD, 12:34 PM 12/01/2018

## 2018-12-01 NOTE — Patient Instructions (Signed)
Take the Diflucan pill once daily for 2 days to treat the vaginal yeast infection  Take the Macrodantin antibiotic twice daily for 7 days to treat the urinary tract infection.  The genetic counselors office will call to arrange an appointment to discuss possible genetic testing in reference to your strong family history of breast cancer.

## 2018-12-01 NOTE — Telephone Encounter (Signed)
Pharmacy sent this message about Macrodantin.   " Pharmacy comment: Alternative Requested:PATIENT STATES SHE CAN NOT TOLERATE THIS ANTIBIOTIC. STATES IT MAKES HER VERY SICK. PLEASE SEND ALTERNATIVE.

## 2018-12-01 NOTE — Telephone Encounter (Signed)
Toni Hill spoke with Dr. Dellis Filbert about this and wrote me back via Skype and told me that Dr. Marguerita Merles recommended change Rx to Cipro 500 mg. Bid x 5 days. Rx sent.

## 2018-12-01 NOTE — Addendum Note (Signed)
Addended by: Nelva Nay on: 12/01/2018 01:05 PM   Modules accepted: Orders

## 2018-12-02 ENCOUNTER — Other Ambulatory Visit (INDEPENDENT_AMBULATORY_CARE_PROVIDER_SITE_OTHER): Payer: Self-pay | Admitting: Orthopaedic Surgery

## 2018-12-02 NOTE — Telephone Encounter (Signed)
Rx request 

## 2018-12-03 LAB — URINALYSIS, COMPLETE W/RFL CULTURE
Bilirubin Urine: NEGATIVE
Glucose, UA: NEGATIVE
Hgb urine dipstick: NEGATIVE
Hyaline Cast: NONE SEEN /LPF
Ketones, ur: NEGATIVE
Nitrites, Initial: NEGATIVE
Specific Gravity, Urine: 1.028 (ref 1.001–1.03)
WBC, UA: >=60 /HPF — AB (ref 0–5)
pH: 5 (ref 5.0–8.0)

## 2018-12-03 LAB — URINE CULTURE
MICRO NUMBER:: 522384
SPECIMEN QUALITY:: ADEQUATE

## 2018-12-03 LAB — CULTURE INDICATED

## 2018-12-05 ENCOUNTER — Encounter: Payer: Self-pay | Admitting: Genetic Counselor

## 2018-12-05 ENCOUNTER — Telehealth: Payer: Self-pay | Admitting: *Deleted

## 2018-12-05 DIAGNOSIS — Z803 Family history of malignant neoplasm of breast: Secondary | ICD-10-CM

## 2018-12-05 NOTE — Telephone Encounter (Signed)
-----   Message from Anastasio Auerbach, MD sent at 12/01/2018 12:48 PM EDT ----- Referral to the oncology genetic counselors reference strong family history of breast cancer in mother, maternal grandmother and 2 sisters

## 2018-12-05 NOTE — Telephone Encounter (Signed)
Referral placed in [place] at [facility] cancer center they will call to schedule.

## 2018-12-08 NOTE — Telephone Encounter (Signed)
Appointment on 01/17/19 @ 1:00pm

## 2018-12-14 ENCOUNTER — Other Ambulatory Visit: Payer: Self-pay | Admitting: Internal Medicine

## 2018-12-14 DIAGNOSIS — Z1231 Encounter for screening mammogram for malignant neoplasm of breast: Secondary | ICD-10-CM

## 2018-12-15 ENCOUNTER — Other Ambulatory Visit: Payer: Medicare Other

## 2018-12-15 ENCOUNTER — Ambulatory Visit
Admission: RE | Admit: 2018-12-15 | Discharge: 2018-12-15 | Disposition: A | Discharge status: Home / Self Care | Payer: Medicare Other | Source: Ambulatory Visit | Attending: Orthopaedic Surgery | Admitting: Orthopaedic Surgery

## 2018-12-15 ENCOUNTER — Other Ambulatory Visit: Payer: Self-pay

## 2018-12-15 DIAGNOSIS — G8929 Other chronic pain: Secondary | ICD-10-CM

## 2018-12-15 DIAGNOSIS — M25562 Pain in left knee: Secondary | ICD-10-CM

## 2018-12-20 ENCOUNTER — Ambulatory Visit (INDEPENDENT_AMBULATORY_CARE_PROVIDER_SITE_OTHER): Payer: Medicare Other | Admitting: Orthopaedic Surgery

## 2018-12-20 ENCOUNTER — Other Ambulatory Visit: Payer: Self-pay

## 2018-12-20 ENCOUNTER — Ambulatory Visit (INDEPENDENT_AMBULATORY_CARE_PROVIDER_SITE_OTHER): Payer: Medicare Other

## 2018-12-20 ENCOUNTER — Encounter: Payer: Self-pay | Admitting: Orthopaedic Surgery

## 2018-12-20 DIAGNOSIS — M1712 Unilateral primary osteoarthritis, left knee: Secondary | ICD-10-CM

## 2018-12-20 MED ORDER — HYDROCODONE-ACETAMINOPHEN 5-325 MG PO TABS: 1.0000 | ORAL_TABLET | Freq: Every day | ORAL | 0 refills | Status: DC | PRN

## 2018-12-20 NOTE — Progress Notes (Addendum)
Office Visit Note   Patient: Toni Hill           Date of Birth: [birth year]           MRN: [id number] Visit Date: 12/20/2018              Requested by: Arlyss Repress, MD No address on file PCP: Arlyss Repress, MD   Assessment & Plan: Visit Diagnoses:  1. 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  Her left knee MRI shows widespread end-stage degenerative joint disease.  She has had numerous cortisone injections in the past without any significant relief.  At this point she elects to proceed with a total knee replacement.  We discussed the risks and benefits and rehab and recovery involved with this endeavor.  She understands and wishes to proceed.  Because she is diabetic and she does not remember her last A1c level we will redraw this to make sure that it is less than 8.0.  Patient has stage III chronic kidney disease.  She denies an allergy to nickel.  We will get preoperative medical clearance from her PCP Dr. Ilene Qua first.  Questions encouraged and answered.  Follow-Up Instructions: Return for 2 week postop visit.   Orders:  Orders Placed This Encounter  Procedures  . XR KNEE 3 VIEW LEFT  . Hemoglobin A1c   Meds ordered this encounter  Medications  . HYDROcodone-acetaminophen (NORCO) 5-325 MG tablet    Sig: Take 1-2 tablets by mouth daily as needed.    Dispense:  20 tablet    Refill:  0      Procedures: No procedures performed   Clinical Data: No additional findings.   Subjective: Chief Complaint  Patient presents with  . Left Knee - Follow-up  . Lower Back - Follow-up    Toni Hill is a 73 year old female who comes in today for review of her lumbar spine and knee MRI.  She continues to have severe pain in the left knee.  She states that she  has some pain in her back but 95% of the pain is in the left knee.  This is causing her significant difficulty with walking and ADLs.  She reports no pain or symptoms and her right lower extremity.  She reports mild pain in her lumbar spine.   Review of Systems  Constitutional: Negative.   HENT: Negative.   Eyes: Negative.   Respiratory: Negative.   Cardiovascular: Negative.   Endocrine: Negative.   Musculoskeletal: Negative.   Neurological: Negative.   Hematological: Negative.   Psychiatric/Behavioral: Negative.   All other systems reviewed and are negative.    Objective: Vital Signs: There were no vitals taken for this visit.  Physical Exam Vitals signs and nursing note reviewed.  Constitutional:      Appearance: She is well-developed.  Pulmonary:     Effort: Pulmonary effort is normal.  Skin:    General: Skin is warm.     Capillary Refill: Capillary refill takes less than 2 seconds.  Neurological:     Mental Status: She is alert and oriented to person, place, and time.  Psychiatric:        Behavior: Behavior normal.        Thought Content: Thought content normal.  Judgment: Judgment normal.     Ortho Exam Left knee exam shows trace joint effusion.  Collaterals and cruciates are stable.  Medial joint line tenderness. Specialty Comments:  No specialty comments available.  Imaging: Xr Knee 3 View Left  Result Date: 12/20/2018 End stage tricompartmental DJD    PMFS History: There are no active problems to display for this patient.  Past Medical History:  Diagnosis Date  . Bilateral lower extremity edema    right > left  . Bipolar 1 disorder ([place])   . CKD (chronic kidney disease) stage 3, GFR 30-59 ml/min [facility])    nephrologist-  BMI nephrology in [facility]  . Diabetes, polyneuropathy ([place])    FEET AND FINGER TIPS  . Fibromyalgia   . GERD (gastroesophageal reflux disease)   . History of chronic gastritis   . Hyperlipidemia   . Hypertension     per pt was take off bp medication because of kidney disease told by her nephrologist  . Hypothyroidism   . Mixed incontinence urge and stress   . OA (osteoarthritis)    KNEES  . OAB (overactive bladder)   . PONV (postoperative nausea and vomiting)   . 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 Type 2 diabetes mellitus treated with insulin ([place])   . Wears glasses     Family History  Problem Relation Age of Onset  . Breast cancer Mother 66  . Cancer Father        ? type  . Breast cancer Sister 59  . Breast cancer Maternal Grandmother        ? age    Past Surgical History:  Procedure Laterality Date  . CHOLECYSTECTOMY OPEN  1990's  . COLONOSCOPY  last one 07-17-2014  . CYSTOSCOPY WITH INJECTION N/A 10/29/2016   Procedure: CYSTOSCOPY WITH INJECTION BOTOX 100 UNITS, urethral dilation;  Surgeon: Carolan Clines, MD;  Location: [place];  Service: Urology;  Laterality: N/A;  . ESOPHAGOGASTRODUODENOSCOPY  last one 07-01-2016  . EXCISIONAL BREAST BX  1990   BENIGN  . ORIF TIBIA & FIBULA FRACTURES  2010   retained rod  . TONSILLECTOMY  age 62  . VAGINAL HYSTERECTOMY  1979  . VENTRAL HERNIA REPAIR  1990's   Social History   Occupational History  . Not on file  Tobacco Use  . Smoking status: Never Smoker  . Smokeless tobacco: Never Used  Substance and Sexual Activity  . Alcohol use: No  . Drug use: No  . Sexual activity: Not Currently    Comment: 1st intercourse 73 yo-Fewer than 5 partners

## 2018-12-21 LAB — HEMOGLOBIN A1C
Hgb A1c MFr Bld: 7.3 % of total Hgb — ABNORMAL HIGH (ref <5.7)
Mean Plasma Glucose: 163 (calc)
eAG (mmol/L): 9 (calc)

## 2018-12-21 LAB — EXTRA SPECIMEN

## 2018-12-21 NOTE — Progress Notes (Signed)
A1c level is good for surgery.  Just need clearance from PCP at this point to proceed with TKA

## 2018-12-22 ENCOUNTER — Telehealth: Payer: Self-pay | Admitting: Orthopaedic Surgery

## 2018-12-22 NOTE — Telephone Encounter (Signed)
Patient called asked if she can get the results of her A1C test   The number to contact patient is [phone]

## 2018-12-22 NOTE — Telephone Encounter (Signed)
See message below °

## 2018-12-24 NOTE — Telephone Encounter (Signed)
7.3

## 2018-12-25 ENCOUNTER — Telehealth: Payer: Self-pay | Admitting: *Deleted

## 2018-12-25 ENCOUNTER — Telehealth: Payer: Self-pay | Admitting: Orthopaedic Surgery

## 2018-12-25 NOTE — Telephone Encounter (Signed)
Patient called requesting a name of PCP to establish care, was going to [facility] but doesn't wish to continue there, she would prefer a specific name of physician. Patient will need medical clearance for knee replacement and would like a good doctor that won't be retiring anytime soon. Please advise

## 2018-12-25 NOTE — Telephone Encounter (Signed)
Received call from patient advised her A1C was 7.3   Forwarded call to [facility] to schedule surgery

## 2018-12-25 NOTE — Telephone Encounter (Signed)
Mrs. Joycelyn Schmid advised on message below. See other message.

## 2018-12-25 NOTE — Telephone Encounter (Signed)
I have been recommending patients go to [employer] and pick these location of the practice where they would like to go and then there will be a list of providers and she can choose who she would like.  I would obviously choose a younger provider so that she will not have to worry about them retiring anytime soon.  I do not like giving specific names of providers only because all of the providers I am most aware of are older

## 2018-12-25 NOTE — Telephone Encounter (Signed)
Patient informed. 

## 2018-12-25 NOTE — Telephone Encounter (Signed)
Noted  

## 2018-12-25 NOTE — Telephone Encounter (Signed)
Called patient back,  no answer. LMOM to return call to advise on message below.

## 2018-12-26 ENCOUNTER — Other Ambulatory Visit (INDEPENDENT_AMBULATORY_CARE_PROVIDER_SITE_OTHER): Payer: Self-pay | Admitting: Orthopaedic Surgery

## 2018-12-26 NOTE — Telephone Encounter (Signed)
Please advise 

## 2018-12-28 ENCOUNTER — Other Ambulatory Visit: Payer: Self-pay

## 2018-12-28 ENCOUNTER — Ambulatory Visit: Payer: Medicare Other

## 2018-12-28 ENCOUNTER — Ambulatory Visit (INDEPENDENT_AMBULATORY_CARE_PROVIDER_SITE_OTHER): Payer: Medicare Other | Admitting: Internal Medicine

## 2018-12-28 VITALS — BP 151/58 | HR 83 | Temp 98.6°F | Ht 69.0 in | Wt 229.1 lb

## 2018-12-28 DIAGNOSIS — G8929 Other chronic pain: Secondary | ICD-10-CM | POA: Diagnosis not present

## 2018-12-28 DIAGNOSIS — K219 Gastro-esophageal reflux disease without esophagitis: Secondary | ICD-10-CM | POA: Diagnosis not present

## 2018-12-28 DIAGNOSIS — N183 Chronic kidney disease, stage 3 unspecified: Secondary | ICD-10-CM

## 2018-12-28 DIAGNOSIS — E1122 Type 2 diabetes mellitus with diabetic chronic kidney disease: Secondary | ICD-10-CM | POA: Diagnosis not present

## 2018-12-28 DIAGNOSIS — E039 Hypothyroidism, unspecified: Secondary | ICD-10-CM

## 2018-12-28 DIAGNOSIS — Z79890 Hormone replacement therapy (postmenopausal): Secondary | ICD-10-CM | POA: Diagnosis not present

## 2018-12-28 DIAGNOSIS — M1712 Unilateral primary osteoarthritis, left knee: Secondary | ICD-10-CM

## 2018-12-28 DIAGNOSIS — E785 Hyperlipidemia, unspecified: Secondary | ICD-10-CM

## 2018-12-28 DIAGNOSIS — N3946 Mixed incontinence: Secondary | ICD-10-CM

## 2018-12-28 DIAGNOSIS — I129 Hypertensive chronic kidney disease with stage 1 through stage 4 chronic kidney disease, or unspecified chronic kidney disease: Secondary | ICD-10-CM | POA: Diagnosis not present

## 2018-12-28 DIAGNOSIS — F419 Anxiety disorder, unspecified: Secondary | ICD-10-CM | POA: Diagnosis not present

## 2018-12-28 DIAGNOSIS — Z79899 Other long term (current) drug therapy: Secondary | ICD-10-CM

## 2018-12-28 DIAGNOSIS — F329 Major depressive disorder, single episode, unspecified: Secondary | ICD-10-CM | POA: Diagnosis not present

## 2018-12-28 MED ORDER — GABAPENTIN 100 MG PO CAPS: 100.0000 mg | ORAL_CAPSULE | Freq: Two times a day (BID) | ORAL | 2 refills | Status: DC
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
## 2018-12-28 NOTE — Patient Instructions (Addendum)
It was nice seeing you today. Thank you for choosing [facility] for your Primary Care.   I think you will do very well with your knee replacement! We will check some blood work today just to make sure you're in great shape for surgery.   Please stop taking sucrafate   Decrease Ativan to half a pill for a 5 days and then stop completely   I have sent a referral to Dessie Coma, our behavioral health counselor. She will call you   Schedule a f/u appointment with your new PCP whenever that gets assigned.   FOLLOW-UP INSTRUCTIONS When: next available with PCP For:  What to bring: all medications  Please contact the clinic if you have any problems, or need to be seen sooner.

## 2018-12-29 ENCOUNTER — Ambulatory Visit: Payer: Medicare Other

## 2018-12-29 LAB — BMP8+ANION GAP
Anion Gap: 15 mmol/L (ref 10.0–18.0)
BUN/Creatinine Ratio: 22 (ref 12–28)
BUN: 32 mg/dL — ABNORMAL HIGH (ref 8–27)
CO2: 23 mmol/L (ref 20–29)
Calcium: 9.4 mg/dL (ref 8.7–10.3)
Chloride: 105 mmol/L (ref 96–106)
Creatinine, Ser: 1.45 mg/dL — ABNORMAL HIGH (ref 0.57–1.00)
GFR calc Af Amer: 42 mL/min/{1.73_m2} — ABNORMAL LOW (ref >59)
GFR calc non Af Amer: 36 mL/min/{1.73_m2} — ABNORMAL LOW (ref >59)
Glucose: 124 mg/dL — ABNORMAL HIGH (ref 65–99)
Potassium: 4.7 mmol/L (ref 3.5–5.2)
Sodium: 143 mmol/L (ref 134–144)

## 2018-12-29 LAB — TSH: TSH: 0.143 u[IU]/mL — ABNORMAL LOW (ref 0.450–4.500)

## 2018-12-30 ENCOUNTER — Other Ambulatory Visit: Payer: Self-pay | Admitting: Internal Medicine

## 2018-12-30 DIAGNOSIS — F419 Anxiety disorder, unspecified: Secondary | ICD-10-CM

## 2018-12-30 DIAGNOSIS — M1712 Unilateral primary osteoarthritis, left knee: Secondary | ICD-10-CM | POA: Insufficient documentation

## 2018-12-30 NOTE — Assessment & Plan Note (Signed)
Uncontrolled, largely situational with family dynamics. Denies SI/HI.   - decrease ativan from 1mg  qd to 0.5mg  qd for 5 days, then discontinue medication - continue sertraline 50mg  qd, f/u with PCP to increase dose or change to different SSRI - CBT referral to [facility]

## 2018-12-30 NOTE — Assessment & Plan Note (Signed)
No recent medication changes. Unable to find recent TSH. Clinically euthyroid.   - continue synthroid 150mcg - repeat TSH

## 2018-12-30 NOTE — Assessment & Plan Note (Signed)
Repeat BMP today shows improved creatine to 1.4

## 2018-12-30 NOTE — Assessment & Plan Note (Signed)
Chronic. Evaluated by ortho and would like to proceed with total knee replacement. MET = 4.0. She is only limited due to knee pain and denies dyspnea or chest pain on exertion. No cardiac history so I do not think ekg is indicated. Repeat BMP shows creatine improved to 1.45. Given insulin use she has around a 1% of a cardiac event during surgery and is considered low risk for knee surgery.   - medically clear for knee replacement

## 2019-01-02 ENCOUNTER — Encounter: Payer: Self-pay | Admitting: Licensed Clinical Social Worker

## 2019-01-02 ENCOUNTER — Telehealth: Payer: Self-pay | Admitting: Licensed Clinical Social Worker

## 2019-01-02 ENCOUNTER — Telehealth: Payer: Self-pay

## 2019-01-02 NOTE — Telephone Encounter (Signed)
[facility] provider who ordered labs is no longer available. Will route to Attending Pool. Hubbard Hartshorn, RN, BSN

## 2019-01-02 NOTE — Telephone Encounter (Signed)
Spoke with Toni Hill, updated on lab results and that she is ok to proceed with surgery. Her TSH is a little low, indicating over-replacement of her hypothyroidism. She is scheduled to see her endocrinologist in the next couple weeks, will defer dose adjustment to her. Endo is Dr. Meredith Pel, can we fax her the most recent lab results?  Toni Hill will need a follow up with [place] in about 3-4 months to meet her new PCP and follow up on DM, thyroid, and knee replacement.  Also, her husband joined the call and asked about being seen, I advised him to call the front office staff to arrange establishing care.

## 2019-01-02 NOTE — Telephone Encounter (Signed)
Patient was contacted due to a referral from her doctor. Patient did not answer, and a voicemail was left for the patient to contact our office if she would like to reschedule. A letter will also be mailed today.

## 2019-01-02 NOTE — Telephone Encounter (Signed)
Requesting lab results. Please call pt back.  

## 2019-01-03 NOTE — Telephone Encounter (Signed)
Dr. Meredith Pel is no longer with [facility] Now at [facility] which does not have access to Epic. Confirmed patient has an appt with her on 01/09/2019. Patient signed AOB on 12/28/2018. Labs from 12/28/2018 and 12/20/2018 (Hgb A1C) faxed to Dr. Meredith Pel at [phone]. Hubbard Hartshorn, RN, BSN

## 2019-01-04 ENCOUNTER — Telehealth: Payer: Self-pay | Admitting: Orthopaedic Surgery

## 2019-01-04 MED ORDER — HYDROCODONE-ACETAMINOPHEN 5-325 MG PO TABS: 1.0000 | ORAL_TABLET | Freq: Every day | ORAL | 0 refills | Status: DC | PRN

## 2019-01-04 NOTE — Telephone Encounter (Signed)
No appt for PCP at this time, please advised.

## 2019-01-04 NOTE — Telephone Encounter (Signed)
I called patient and advised. 

## 2019-01-04 NOTE — Telephone Encounter (Signed)
Can you advise since Dr. Erlinda Hong is out of office?

## 2019-01-04 NOTE — Telephone Encounter (Signed)
Patient called requested refill of Hydrocodone. Surgery not until 01/15/19.  She only has 2 left. Pt having pain

## 2019-01-04 NOTE — Telephone Encounter (Signed)
I'll send some in. 

## 2019-01-07 NOTE — Progress Notes (Signed)
Internal Medicine Clinic Attending  Case discussed with Dr. Donne Hazel at the time of the visit.  We reviewed the resident's history and exam and pertinent patient test results.  I agree with the assessment, diagnosis, and plan of care documented in the resident's note.     Discussed surgical risk models with Dr. Donne Hazel and agree that patient is low risk for procedure.

## 2019-01-07 NOTE — Addendum Note (Signed)
Addended by: Gilles Chiquito B on: 01/07/2019 11:00 AM   Modules accepted: Level of Service

## 2019-01-09 ENCOUNTER — Telehealth: Payer: Self-pay | Admitting: Licensed Clinical Social Worker

## 2019-01-09 NOTE — Telephone Encounter (Signed)
Patient was contacted due to a referral from her doctor (2nd attempt). Patient answered, and reported that she would like to be called for a visit, but needed to be somewhere private. Patient will be called next Tuesday, 14th.

## 2019-01-09 NOTE — Pre-Procedure Instructions (Signed)
HATICE BUBEL  01/09/2019      [id number] [place], [address] [place] [address] Phone: [phone] Fax: [phone]    Your procedure is scheduled on January 15, 2019.  Report to [facility] Entrance "A" at 815 AM.  Call this number if you have problems the morning of surgery:  [phone]   Call [phone] if you have any questions prior to your surgery date Monday-Friday 8am-4pm    Remember:  Do not eat or drink after midnight.  You may drink clear liquids until 715 AM.  Clear liquids allowed are:    Water, Juice (non-citric and without pulp), Clear Tea, Black Coffee only and Gatorade (G2)  Please complete your Gatorade (G2) that was provided to you by 715 AM the morning of surgery.  Please, if able, drink it in one setting. DO NOT SIP.    Take these medicines the morning of surgery with A SIP OF WATER  Gabapentin (neurontin) Levothyroxine (synthroid) Sertraline (zoloft) Tylenol (acetaminophen)-if needed for pain Hydrocodone-acetaminophen-if needed for more severe pain  Follow your surgeon's instructions on when to hold/resume aspirin.  If no instructions were given call the office to determine how they would like to you take aspirin  7 days prior to surgery STOP taking any Aleve, Naproxen, Ibuprofen, Motrin, Advil, Goody's, BC's, all herbal medications, fish oil, and all vitamins    WHAT DO I DO ABOUT MY DIABETES MEDICATION?   . THE NIGHT BEFORE SURGERY, take your normal dose of Novolog insulin with dinner.     . THE MORNING OF SURGERY, take 12 units of insulin Glargine (1/2 of your normal dose) and NO Novolog insulin unless your CBG is greater than 220 mg/dL, you may take  of your sliding scale (correction) dose of insulin (3-4 units)   . If your CBG is greater than 220 mg/dL, you may take  of your sliding scale (correction) dose of insulin.  Reviewed and Endorsed by [facility] Patient Education Committee,  August 2015  How to Manage Your Diabetes Before and After Surgery  Why is it important to control my blood sugar before and after surgery? . Improving blood sugar levels before and after surgery helps healing and can limit problems. . A way of improving blood sugar control is eating a healthy diet by: o  Eating less sugar and carbohydrates o  Increasing activity/exercise o  Talking with your doctor about reaching your blood sugar goals . High blood sugars (greater than 180 mg/dL) can raise your risk of infections and slow your recovery, so you will need to focus on controlling your diabetes during the weeks before surgery. . Make sure that the doctor who takes care of your diabetes knows about your planned surgery including the date and location.  How do I manage my blood sugar before surgery? . Check your blood sugar at least 4 times a day, starting 2 days before surgery, to make sure that the level is not too high or low. o Check your blood sugar the morning of your surgery when you wake up and every 2 hours until you get to the Short Stay unit. . If your blood sugar is less than 70 mg/dL, you will need to treat for low blood sugar: o Do not take insulin. o Treat a low blood sugar (less than 70 mg/dL) with  cup of clear juice (cranberry or apple), 4 glucose tablets, OR glucose gel. Recheck blood sugar in 15 minutes  after treatment (to make sure it is greater than 70 mg/dL). If your blood sugar is not greater than 70 mg/dL on recheck, call [phone] o  for further instructions. . Report your blood sugar to the short stay nurse when you get to Short Stay.  . If you are admitted to the hospital after surgery: o Your blood sugar will be checked by the staff and you will probably be given insulin after surgery (instead of oral diabetes medicines) to make sure you have good blood sugar levels. o The goal for blood sugar control after surgery is 80-180 mg/dL.  [place]- Preparing For  Surgery  Before surgery, you can play an important role. Because skin is not sterile, your skin needs to be as free of germs as possible. You can reduce the number of germs on your skin by washing with CHG (chlorahexidine gluconate) Soap before surgery.  CHG is an antiseptic cleaner which kills germs and bonds with the skin to continue killing germs even after washing.    Oral Hygiene is also important to reduce your risk of infection.  Remember - BRUSH YOUR TEETH THE MORNING OF SURGERY WITH YOUR REGULAR TOOTHPASTE  Please do not use if you have an allergy to CHG or antibacterial soaps. If your skin becomes reddened/irritated stop using the CHG.  Do not shave (including legs and underarms) for at least 48 hours prior to first CHG shower. It is OK to shave your face.  Please follow these instructions carefully.   1. Shower the NIGHT BEFORE SURGERY and the MORNING OF SURGERY with CHG.   2. If you chose to wash your hair, wash your hair first as usual with your normal shampoo.  3. After you shampoo, rinse your hair and body thoroughly to remove the shampoo.  4. Use CHG as you would any other liquid soap. You can apply CHG directly to the skin and wash gently with a scrungie or a clean washcloth.   5. Apply the CHG Soap to your body ONLY FROM THE NECK DOWN.  Do not use on open wounds or open sores. Avoid contact with your eyes, ears, mouth and genitals (private parts). Wash Face and genitals (private parts)  with your normal soap.  6. Wash thoroughly, paying special attention to the area where your surgery will be performed.  7. Thoroughly rinse your body with warm water from the neck down.  8. DO NOT shower/wash with your normal soap after using and rinsing off the CHG Soap.  9. Pat yourself dry with a CLEAN TOWEL.  10. Wear CLEAN PAJAMAS to bed the night before surgery, wear comfortable clothes the morning of surgery  11. Place CLEAN SHEETS on your bed the night of your first shower and  DO NOT SLEEP WITH PETS.  Day of Surgery:  Do not apply any deodorants/lotions.  Please wear clean clothes to the hospital/surgery center.   Remember to brush your teeth WITH YOUR REGULAR TOOTHPASTE.    Do not wear jewelry, make-up or nail polish.  Do not wear lotions, powders, or perfumes, or deodorant.  Do not shave 48 hours prior to surgery.    Do not bring valuables to the hospital.  [facility] is not responsible for any belongings or valuables.  Contacts, dentures or bridgework may not be worn into surgery.  Leave your suitcase in the car.  After surgery it may be brought to your room.  For patients admitted to the hospital, discharge time will be determined by your  treatment team.  IF you are a smoker, DO NOT Smoke 24 hours prior to surgery   IF you wear a CPAP at night please bring your mask, tubing, and machine the morning of surgery    Remember that you must have someone to transport you home after your surgery, and remain with you for 24 hours if you are discharged the same day.  Please read over the following fact sheets that you were given.

## 2019-01-10 ENCOUNTER — Encounter (HOSPITAL_COMMUNITY): Payer: Self-pay

## 2019-01-10 ENCOUNTER — Ambulatory Visit (HOSPITAL_COMMUNITY)
Admission: RE | Admit: 2019-01-10 | Discharge: 2019-01-10 | Disposition: A | Discharge status: Home / Self Care | Payer: Medicare Other | Source: Ambulatory Visit | Attending: Physician Assistant | Admitting: Physician Assistant

## 2019-01-10 ENCOUNTER — Other Ambulatory Visit: Payer: Self-pay

## 2019-01-10 ENCOUNTER — Encounter (HOSPITAL_COMMUNITY)
Admission: RE | Admit: 2019-01-10 | Discharge: 2019-01-10 | Disposition: A | Discharge status: Home / Self Care | Payer: Medicare Other | Source: Ambulatory Visit | Attending: Orthopaedic Surgery | Admitting: Orthopaedic Surgery

## 2019-01-10 DIAGNOSIS — E1122 Type 2 diabetes mellitus with diabetic chronic kidney disease: Secondary | ICD-10-CM | POA: Diagnosis not present

## 2019-01-10 DIAGNOSIS — M1712 Unilateral primary osteoarthritis, left knee: Secondary | ICD-10-CM | POA: Diagnosis not present

## 2019-01-10 DIAGNOSIS — Z79899 Other long term (current) drug therapy: Secondary | ICD-10-CM | POA: Insufficient documentation

## 2019-01-10 DIAGNOSIS — N183 Chronic kidney disease, stage 3 (moderate): Secondary | ICD-10-CM | POA: Insufficient documentation

## 2019-01-10 DIAGNOSIS — E785 Hyperlipidemia, unspecified: Secondary | ICD-10-CM | POA: Diagnosis not present

## 2019-01-10 DIAGNOSIS — Z794 Long term (current) use of insulin: Secondary | ICD-10-CM | POA: Diagnosis not present

## 2019-01-10 DIAGNOSIS — Z01818 Encounter for other preprocedural examination: Secondary | ICD-10-CM | POA: Insufficient documentation

## 2019-01-10 DIAGNOSIS — I129 Hypertensive chronic kidney disease with stage 1 through stage 4 chronic kidney disease, or unspecified chronic kidney disease: Secondary | ICD-10-CM | POA: Insufficient documentation

## 2019-01-10 DIAGNOSIS — E039 Hypothyroidism, unspecified: Secondary | ICD-10-CM | POA: Diagnosis not present

## 2019-01-10 DIAGNOSIS — Z79890 Hormone replacement therapy (postmenopausal): Secondary | ICD-10-CM | POA: Insufficient documentation

## 2019-01-10 LAB — COMPREHENSIVE METABOLIC PANEL
ALT: 12 U/L (ref 0–44)
AST: 15 U/L (ref 15–41)
Albumin: 3.7 g/dL (ref 3.5–5.0)
Alkaline Phosphatase: 56 U/L (ref 38–126)
Anion gap: 8 (ref 5–15)
BUN: 28 mg/dL — ABNORMAL HIGH (ref 8–23)
CO2: 26 mmol/L (ref 22–32)
Calcium: 9.5 mg/dL (ref 8.9–10.3)
Chloride: 107 mmol/L (ref 98–111)
Creatinine, Ser: 1.61 mg/dL — ABNORMAL HIGH (ref 0.44–1.00)
GFR calc Af Amer: 37 mL/min — ABNORMAL LOW (ref >60)
GFR calc non Af Amer: 32 mL/min — ABNORMAL LOW (ref >60)
Glucose, Bld: 351 mg/dL — ABNORMAL HIGH (ref 70–99)
Potassium: 4.9 mmol/L (ref 3.5–5.1)
Sodium: 141 mmol/L (ref 135–145)
Total Bilirubin: 0.6 mg/dL (ref 0.3–1.2)
Total Protein: 6.1 g/dL — ABNORMAL LOW (ref 6.5–8.1)

## 2019-01-10 LAB — CBC WITH DIFFERENTIAL/PLATELET
Abs Immature Granulocytes: 0.02 10*3/uL (ref 0.00–0.07)
Basophils Absolute: 0 10*3/uL (ref 0.0–0.1)
Basophils Relative: 1 %
Eosinophils Absolute: 0 10*3/uL (ref 0.0–0.5)
Eosinophils Relative: 0 %
HCT: 41.8 % (ref 36.0–46.0)
Hemoglobin: 13.2 g/dL (ref 12.0–15.0)
Immature Granulocytes: 0 %
Lymphocytes Relative: 29 %
Lymphs Abs: 1.6 10*3/uL (ref 0.7–4.0)
MCH: 32.8 pg (ref 26.0–34.0)
MCHC: 31.6 g/dL (ref 30.0–36.0)
MCV: 103.7 fL — ABNORMAL HIGH (ref 80.0–100.0)
Monocytes Absolute: 0.6 10*3/uL (ref 0.1–1.0)
Monocytes Relative: 10 %
Neutro Abs: 3.3 10*3/uL (ref 1.7–7.7)
Neutrophils Relative %: 60 %
Platelets: 301 10*3/uL (ref 150–400)
RBC: 4.03 MIL/uL (ref 3.87–5.11)
RDW: 12.7 % (ref 11.5–15.5)
WBC: 5.6 10*3/uL (ref 4.0–10.5)
nRBC: 0 % (ref 0.0–0.2)

## 2019-01-10 LAB — TYPE AND SCREEN
ABO/RH(D): A POS
Antibody Screen: NEGATIVE

## 2019-01-10 LAB — GLUCOSE, CAPILLARY: Glucose-Capillary: 324 mg/dL — ABNORMAL HIGH (ref 70–99)

## 2019-01-10 LAB — PROTIME-INR
INR: 1.1 (ref 0.8–1.2)
Prothrombin Time: 13.9 seconds (ref 11.4–15.2)

## 2019-01-10 LAB — SURGICAL PCR SCREEN
MRSA, PCR: NEGATIVE
Staphylococcus aureus: NEGATIVE

## 2019-01-10 LAB — ABO/RH: ABO/RH(D): A POS

## 2019-01-10 LAB — APTT: aPTT: 26 seconds (ref 24–36)
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
## 2019-01-10 NOTE — Progress Notes (Signed)
Has she ever seen cardiology?

## 2019-01-11 ENCOUNTER — Other Ambulatory Visit (HOSPITAL_COMMUNITY)
Admission: RE | Admit: 2019-01-11 | Discharge: 2019-01-11 | Disposition: A | Discharge status: Home / Self Care | Payer: Medicare Other | Source: Ambulatory Visit | Attending: Orthopaedic Surgery | Admitting: Orthopaedic Surgery

## 2019-01-11 DIAGNOSIS — Z01812 Encounter for preprocedural laboratory examination: Secondary | ICD-10-CM | POA: Diagnosis present

## 2019-01-11 DIAGNOSIS — Z1159 Encounter for screening for other viral diseases: Secondary | ICD-10-CM | POA: Insufficient documentation

## 2019-01-11 NOTE — Anesthesia Preprocedure Evaluation (Addendum)
Anesthesia Evaluation  Patient identified by MRN, date of birth, ID band Patient awake    Reviewed: Allergy & Precautions, NPO status , Patient's Chart, lab work & pertinent test results  History of Anesthesia Complications (+) PONV and history of anesthetic complications  Airway Mallampati: I  TM Distance: >3 FB Neck ROM: Full    Dental no notable dental hx. (+) Missing, Dental Advisory Given,    Pulmonary neg pulmonary ROS,    Pulmonary exam normal breath sounds clear to auscultation       Cardiovascular hypertension, negative cardio ROS Normal cardiovascular exam Rhythm:Regular Rate:Normal     Neuro/Psych PSYCHIATRIC DISORDERS Anxiety Bipolar Disorder negative neurological ROS     GI/Hepatic Neg liver ROS, GERD  ,  Endo/Other  diabetes, Insulin DependentHypothyroidism   Renal/GU Renal InsufficiencyRenal disease  negative genitourinary   Musculoskeletal  (+) Arthritis , Fibromyalgia -  Abdominal   Peds  Hematology negative hematology ROS (+)   Anesthesia Other Findings   Reproductive/Obstetrics                          Anesthesia Physical Anesthesia Plan  ASA: III  Anesthesia Plan: Spinal and Regional   Post-op Pain Management:  Regional for Post-op pain   Induction: Intravenous  PONV Risk Score and Plan: 3 and Propofol infusion and Treatment may vary due to age or medical condition  Airway Management Planned: Natural Airway and Simple Face Mask  Additional Equipment:   Intra-op Plan:   Post-operative Plan:   Informed Consent: I have reviewed the patients History and Physical, chart, labs and discussed the procedure including the risks, benefits and alternatives for the proposed anesthesia with the patient or authorized representative who has indicated his/her understanding and acceptance.     Dental advisory given  Plan Discussed with: CRNA  Anesthesia Plan Comments:  (Cleared by PCP 12/30/18: "Evaluated by ortho and would like to proceed with total knee replacement. MET = 4.0. She is only limited due to knee pain and denies dyspnea or chest pain on exertion. No cardiac history so I do not think ekg is indicated. Repeat BMP shows creatine improved to 1.45. Given insulin use she has around a 1% of a cardiac event during surgery and is considered low risk for knee surgery.  - medically clear for knee replacement"  Preop labs notable for elevated BG 351 mg/dl. Recent A1c 7.3 on 12/20/18 shows acceptable long term glycemic control. Creatinine also elevated at 1.61. Review of history shows stable CKD 3 with baseline creatinine ~1.5.  TTE 12/15/17 (care everywhere): Conclusions Summary Normal left ventricular size and systolic function with no appreciable segmental abnormality. Ejection fraction is visually estimated at 65% Normal left ventricular wall thickness Mild Left atrial enlargement. Diastolic function appears normal  Nuclear stress 09/22/17 (care everywhere): Well tolerated Lexiscan perfusion stress test, with no clinical ischemia  during vasodilator stress.  There is normal isotope uptake following Lexiscan injection and at rest.  There is no evidence of ischemia.  Normal LV function with EF of 67 %.)       Anesthesia Quick Evaluation

## 2019-01-12 LAB — SARS CORONAVIRUS 2 (TAT 6-24 HRS): SARS Coronavirus 2: NEGATIVE

## 2019-01-12 MED ORDER — VANCOMYCIN HCL 10 G IV SOLR
1500.0000 mg | INTRAVENOUS | Status: AC
  Administered 2019-01-15: 1500 mg via INTRAVENOUS
  Filled 2019-01-12 (×2): qty 1500

## 2019-01-12 MED ORDER — TRANEXAMIC ACID 1000 MG/10ML IV SOLN
2000.0000 mg | INTRAVENOUS | Status: DC
  Filled 2019-01-12: qty 20

## 2019-01-12 MED ORDER — BUPIVACAINE LIPOSOME 1.3 % IJ SUSP
20.0000 mL | Freq: Once | INTRAMUSCULAR | Status: DC
  Filled 2019-01-12: qty 20

## 2019-01-13 ENCOUNTER — Encounter (HOSPITAL_COMMUNITY): Payer: Self-pay | Admitting: Registered Nurse

## 2019-01-15 ENCOUNTER — Other Ambulatory Visit: Payer: Self-pay

## 2019-01-15 ENCOUNTER — Inpatient Hospital Stay (HOSPITAL_COMMUNITY)
Admission: RE | Admit: 2019-01-15 | Discharge: 2019-01-18 | DRG: 470 | Disposition: A | Discharge status: Skilled Nursing Facility | Payer: Medicare Other | Attending: Orthopaedic Surgery | Admitting: Orthopaedic Surgery

## 2019-01-15 ENCOUNTER — Encounter (HOSPITAL_COMMUNITY): Payer: Self-pay

## 2019-01-15 ENCOUNTER — Ambulatory Visit (HOSPITAL_COMMUNITY): Payer: Medicare Other | Admitting: Registered Nurse

## 2019-01-15 ENCOUNTER — Encounter (HOSPITAL_COMMUNITY)
Admission: RE | Disposition: A | Discharge status: Skilled Nursing Facility | Payer: Self-pay | Source: Home / Self Care | Attending: Orthopaedic Surgery

## 2019-01-15 ENCOUNTER — Ambulatory Visit (HOSPITAL_COMMUNITY): Payer: Medicare Other | Admitting: Vascular Surgery

## 2019-01-15 ENCOUNTER — Ambulatory Visit (HOSPITAL_COMMUNITY): Payer: Medicare Other

## 2019-01-15 DIAGNOSIS — M79662 Pain in left lower leg: Secondary | ICD-10-CM

## 2019-01-15 DIAGNOSIS — Z1159 Encounter for screening for other viral diseases: Secondary | ICD-10-CM

## 2019-01-15 DIAGNOSIS — E669 Obesity, unspecified: Secondary | ICD-10-CM | POA: Diagnosis present

## 2019-01-15 DIAGNOSIS — Z7982 Long term (current) use of aspirin: Secondary | ICD-10-CM | POA: Diagnosis not present

## 2019-01-15 DIAGNOSIS — Z79899 Other long term (current) drug therapy: Secondary | ICD-10-CM

## 2019-01-15 DIAGNOSIS — M25762 Osteophyte, left knee: Secondary | ICD-10-CM | POA: Diagnosis present

## 2019-01-15 DIAGNOSIS — Z880 Allergy status to penicillin: Secondary | ICD-10-CM

## 2019-01-15 DIAGNOSIS — N179 Acute kidney failure, unspecified: Secondary | ICD-10-CM | POA: Diagnosis present

## 2019-01-15 DIAGNOSIS — E785 Hyperlipidemia, unspecified: Secondary | ICD-10-CM | POA: Diagnosis present

## 2019-01-15 DIAGNOSIS — Z885 Allergy status to narcotic agent status: Secondary | ICD-10-CM

## 2019-01-15 DIAGNOSIS — Z794 Long term (current) use of insulin: Secondary | ICD-10-CM

## 2019-01-15 DIAGNOSIS — D62 Acute posthemorrhagic anemia: Secondary | ICD-10-CM | POA: Diagnosis not present

## 2019-01-15 DIAGNOSIS — M1712 Unilateral primary osteoarthritis, left knee: Secondary | ICD-10-CM

## 2019-01-15 DIAGNOSIS — F319 Bipolar disorder, unspecified: Secondary | ICD-10-CM | POA: Diagnosis present

## 2019-01-15 DIAGNOSIS — M797 Fibromyalgia: Secondary | ICD-10-CM | POA: Diagnosis present

## 2019-01-15 DIAGNOSIS — E1122 Type 2 diabetes mellitus with diabetic chronic kidney disease: Secondary | ICD-10-CM | POA: Diagnosis present

## 2019-01-15 DIAGNOSIS — E039 Hypothyroidism, unspecified: Secondary | ICD-10-CM | POA: Diagnosis present

## 2019-01-15 DIAGNOSIS — Z6833 Body mass index (BMI) 33.0-33.9, adult: Secondary | ICD-10-CM

## 2019-01-15 DIAGNOSIS — E1142 Type 2 diabetes mellitus with diabetic polyneuropathy: Secondary | ICD-10-CM | POA: Diagnosis present

## 2019-01-15 DIAGNOSIS — N183 Chronic kidney disease, stage 3 (moderate): Secondary | ICD-10-CM | POA: Diagnosis present

## 2019-01-15 DIAGNOSIS — Z96652 Presence of left artificial knee joint: Secondary | ICD-10-CM

## 2019-01-15 DIAGNOSIS — Z79890 Hormone replacement therapy (postmenopausal): Secondary | ICD-10-CM | POA: Diagnosis not present

## 2019-01-15 DIAGNOSIS — M170 Bilateral primary osteoarthritis of knee: Principal | ICD-10-CM | POA: Diagnosis present

## 2019-01-15 DIAGNOSIS — I129 Hypertensive chronic kidney disease with stage 1 through stage 4 chronic kidney disease, or unspecified chronic kidney disease: Secondary | ICD-10-CM | POA: Diagnosis present

## 2019-01-15 DIAGNOSIS — Z888 Allergy status to other drugs, medicaments and biological substances status: Secondary | ICD-10-CM | POA: Diagnosis not present

## 2019-01-15 HISTORY — PX: TOTAL KNEE ARTHROPLASTY: SHX125

## 2019-01-15 LAB — GLUCOSE, CAPILLARY
Glucose-Capillary: 190 mg/dL — ABNORMAL HIGH (ref 70–99)
Glucose-Capillary: 140 mg/dL — ABNORMAL HIGH (ref 70–99)
Glucose-Capillary: 164 mg/dL — ABNORMAL HIGH (ref 70–99)
Glucose-Capillary: 215 mg/dL — ABNORMAL HIGH (ref 70–99)

## 2019-01-15 SURGERY — ARTHROPLASTY, KNEE, TOTAL
Anesthesia: Regional | Site: Knee | Laterality: Left

## 2019-01-15 MED ORDER — POVIDONE-IODINE 10 % EX SWAB: 2.0000 "application " | Freq: Once | CUTANEOUS | Status: DC

## 2019-01-15 MED ORDER — GENTAMICIN SULFATE 40 MG/ML IJ SOLN
INTRAMUSCULAR | Status: AC
  Filled 2019-01-15: qty 2

## 2019-01-15 MED ORDER — SODIUM CHLORIDE 0.9 % IR SOLN
Status: DC | PRN
  Administered 2019-01-15: 3000 mL

## 2019-01-15 MED ORDER — FENTANYL CITRATE (PF) 100 MCG/2ML IJ SOLN: 25.0000 ug | INTRAMUSCULAR | Status: DC | PRN

## 2019-01-15 MED ORDER — 0.9 % SODIUM CHLORIDE (POUR BTL) OPTIME
TOPICAL | Status: DC | PRN
  Administered 2019-01-15: 1000 mL

## 2019-01-15 MED ORDER — DOCUSATE SODIUM 100 MG PO CAPS
100.0000 mg | ORAL_CAPSULE | Freq: Two times a day (BID) | ORAL | Status: DC
  Administered 2019-01-15 – 2019-01-18 (×5): 100 mg via ORAL
  Filled 2019-01-15 (×7): qty 1

## 2019-01-15 MED ORDER — VANCOMYCIN HCL 1000 MG IV SOLR
INTRAVENOUS | Status: DC | PRN
  Administered 2019-01-15: 1000 mg

## 2019-01-15 MED ORDER — METOCLOPRAMIDE HCL 5 MG/ML IJ SOLN: 5.0000 mg | Freq: Three times a day (TID) | INTRAMUSCULAR | Status: DC | PRN

## 2019-01-15 MED ORDER — GABAPENTIN 300 MG PO CAPS
300.0000 mg | ORAL_CAPSULE | Freq: Three times a day (TID) | ORAL | Status: DC
  Administered 2019-01-15 – 2019-01-18 (×11): 300 mg via ORAL
  Filled 2019-01-15 (×11): qty 1

## 2019-01-15 MED ORDER — GABAPENTIN 100 MG PO CAPS: 100.0000 mg | ORAL_CAPSULE | Freq: Two times a day (BID) | ORAL | Status: DC

## 2019-01-15 MED ORDER — FENTANYL CITRATE (PF) 250 MCG/5ML IJ SOLN
INTRAMUSCULAR | Status: AC
  Filled 2019-01-15: qty 5

## 2019-01-15 MED ORDER — ACETAMINOPHEN 500 MG PO TABS
ORAL_TABLET | ORAL | Status: AC
  Administered 2019-01-15: 1000 mg via ORAL
  Filled 2019-01-15: qty 2

## 2019-01-15 MED ORDER — PROPOFOL 10 MG/ML IV BOLUS
INTRAVENOUS | Status: DC | PRN
  Administered 2019-01-15: 10 mg via INTRAVENOUS
  Administered 2019-01-15: 20 mg via INTRAVENOUS
  Administered 2019-01-15: 10 mg via INTRAVENOUS
  Administered 2019-01-15: 20 mg via INTRAVENOUS

## 2019-01-15 MED ORDER — BUPIVACAINE-EPINEPHRINE (PF) 0.25% -1:200000 IJ SOLN
INTRAMUSCULAR | Status: AC
  Filled 2019-01-15: qty 30

## 2019-01-15 MED ORDER — ACETAMINOPHEN 325 MG PO TABS
325.0000 mg | ORAL_TABLET | Freq: Four times a day (QID) | ORAL | Status: DC | PRN
  Administered 2019-01-17: 06:00:00 650 mg via ORAL
  Filled 2019-01-15: qty 2

## 2019-01-15 MED ORDER — ONDANSETRON HCL 4 MG PO TABS: 4.0000 mg | ORAL_TABLET | Freq: Three times a day (TID) | ORAL | 0 refills | Status: DC | PRN

## 2019-01-15 MED ORDER — OXYCODONE HCL ER 10 MG PO T12A
10.0000 mg | EXTENDED_RELEASE_TABLET | Freq: Two times a day (BID) | ORAL | Status: DC
  Administered 2019-01-15 – 2019-01-18 (×8): 10 mg via ORAL
  Filled 2019-01-15 (×9): qty 1

## 2019-01-15 MED ORDER — TRANEXAMIC ACID-NACL 1000-0.7 MG/100ML-% IV SOLN
INTRAVENOUS | Status: AC
  Filled 2019-01-15: qty 100

## 2019-01-15 MED ORDER — BUPIVACAINE LIPOSOME 1.3 % IJ SUSP
INTRAMUSCULAR | Status: DC | PRN
  Administered 2019-01-15: 20 mL

## 2019-01-15 MED ORDER — DIPHENHYDRAMINE HCL 12.5 MG/5ML PO ELIX: 25.0000 mg | ORAL_SOLUTION | ORAL | Status: DC | PRN

## 2019-01-15 MED ORDER — INSULIN ASPART 100 UNIT/ML ~~LOC~~ SOLN
0.0000 [IU] | Freq: Three times a day (TID) | SUBCUTANEOUS | Status: DC
  Administered 2019-01-15: 17:00:00 4 [IU] via SUBCUTANEOUS
  Administered 2019-01-16: 15 [IU] via SUBCUTANEOUS
  Administered 2019-01-16: 4 [IU] via SUBCUTANEOUS
  Administered 2019-01-16: 09:00:00 3 [IU] via SUBCUTANEOUS
  Administered 2019-01-17: 17:00:00 7 [IU] via SUBCUTANEOUS
  Administered 2019-01-17: 08:00:00 11 [IU] via SUBCUTANEOUS
  Administered 2019-01-18: 12:00:00 4 [IU] via SUBCUTANEOUS

## 2019-01-15 MED ORDER — OXYCODONE HCL 5 MG PO TABS
10.0000 mg | ORAL_TABLET | ORAL | Status: DC | PRN
  Administered 2019-01-18: 15 mg via ORAL
  Administered 2019-01-18: 10 mg via ORAL
  Filled 2019-01-15: qty 3

## 2019-01-15 MED ORDER — DEXAMETHASONE SODIUM PHOSPHATE 10 MG/ML IJ SOLN
10.0000 mg | Freq: Once | INTRAMUSCULAR | Status: AC
  Administered 2019-01-16: 10 mg via INTRAVENOUS
  Filled 2019-01-15: qty 1

## 2019-01-15 MED ORDER — SODIUM CHLORIDE 0.9% FLUSH
INTRAVENOUS | Status: DC | PRN
  Administered 2019-01-15: 20 mL

## 2019-01-15 MED ORDER — CHLORHEXIDINE GLUCONATE 4 % EX LIQD: 60.0000 mL | Freq: Once | CUTANEOUS | Status: DC

## 2019-01-15 MED ORDER — SENNOSIDES-DOCUSATE SODIUM 8.6-50 MG PO TABS: 1.0000 | ORAL_TABLET | Freq: Every evening | ORAL | 1 refills | Status: DC | PRN

## 2019-01-15 MED ORDER — TRANEXAMIC ACID-NACL 1000-0.7 MG/100ML-% IV SOLN
1000.0000 mg | INTRAVENOUS | Status: AC
  Administered 2019-01-15: 1000 mg via INTRAVENOUS

## 2019-01-15 MED ORDER — OXYCODONE-ACETAMINOPHEN 5-325 MG PO TABS: 1.0000 | ORAL_TABLET | Freq: Three times a day (TID) | ORAL | 0 refills | Status: DC | PRN

## 2019-01-15 MED ORDER — POLYETHYLENE GLYCOL 3350 17 G PO PACK: 17.0000 g | PACK | Freq: Every day | ORAL | Status: DC | PRN

## 2019-01-15 MED ORDER — METHOCARBAMOL 750 MG PO TABS: 750.0000 mg | ORAL_TABLET | Freq: Two times a day (BID) | ORAL | 0 refills | Status: DC | PRN

## 2019-01-15 MED ORDER — TRANEXAMIC ACID-NACL 1000-0.7 MG/100ML-% IV SOLN
1000.0000 mg | Freq: Once | INTRAVENOUS | Status: AC
  Administered 2019-01-15: 17:00:00 1000 mg via INTRAVENOUS
  Filled 2019-01-15: qty 100

## 2019-01-15 MED ORDER — ACETAMINOPHEN 500 MG PO TABS
1000.0000 mg | ORAL_TABLET | Freq: Four times a day (QID) | ORAL | Status: AC
  Administered 2019-01-15 – 2019-01-16 (×3): 1000 mg via ORAL
  Filled 2019-01-15 (×3): qty 2

## 2019-01-15 MED ORDER — OXYCODONE HCL 5 MG PO TABS
5.0000 mg | ORAL_TABLET | ORAL | Status: DC | PRN
  Administered 2019-01-17: 5 mg via ORAL
  Filled 2019-01-15 (×4): qty 2

## 2019-01-15 MED ORDER — LISINOPRIL 10 MG PO TABS
10.0000 mg | ORAL_TABLET | Freq: Every day | ORAL | Status: DC
  Administered 2019-01-15 – 2019-01-18 (×4): 10 mg via ORAL
  Filled 2019-01-15 (×4): qty 1

## 2019-01-15 MED ORDER — FENTANYL CITRATE (PF) 100 MCG/2ML IJ SOLN
50.0000 ug | Freq: Once | INTRAMUSCULAR | Status: AC
  Administered 2019-01-15: 11:00:00 50 ug via INTRAVENOUS

## 2019-01-15 MED ORDER — EZETIMIBE 10 MG PO TABS
10.0000 mg | ORAL_TABLET | Freq: Every evening | ORAL | Status: DC
  Administered 2019-01-15 – 2019-01-18 (×4): 10 mg via ORAL
  Filled 2019-01-15 (×4): qty 1

## 2019-01-15 MED ORDER — FENTANYL CITRATE (PF) 100 MCG/2ML IJ SOLN
INTRAMUSCULAR | Status: DC | PRN
  Administered 2019-01-15 (×2): 50 ug via INTRAVENOUS

## 2019-01-15 MED ORDER — MIDAZOLAM HCL 2 MG/2ML IJ SOLN
INTRAMUSCULAR | Status: AC
  Filled 2019-01-15: qty 2

## 2019-01-15 MED ORDER — INSULIN GLARGINE 100 UNIT/ML ~~LOC~~ SOLN
25.0000 [IU] | Freq: Every day | SUBCUTANEOUS | Status: DC
  Administered 2019-01-16 – 2019-01-18 (×3): 25 [IU] via SUBCUTANEOUS
  Filled 2019-01-15 (×3): qty 0.25

## 2019-01-15 MED ORDER — METHOCARBAMOL 500 MG PO TABS
500.0000 mg | ORAL_TABLET | Freq: Four times a day (QID) | ORAL | Status: DC | PRN
  Administered 2019-01-15 – 2019-01-18 (×5): 500 mg via ORAL
  Filled 2019-01-15 (×6): qty 1

## 2019-01-15 MED ORDER — ASPIRIN EC 81 MG PO TBEC: 81.0000 mg | DELAYED_RELEASE_TABLET | Freq: Two times a day (BID) | ORAL | 0 refills | Status: DC

## 2019-01-15 MED ORDER — SULFAMETHOXAZOLE-TRIMETHOPRIM 800-160 MG PO TABS: 1.0000 | ORAL_TABLET | Freq: Two times a day (BID) | ORAL | 0 refills | Status: DC

## 2019-01-15 MED ORDER — SODIUM CHLORIDE 0.9 % IV SOLN
INTRAVENOUS | Status: DC
  Administered 2019-01-15 (×2): via INTRAVENOUS

## 2019-01-15 MED ORDER — PROMETHAZINE HCL 25 MG PO TABS: 25.0000 mg | ORAL_TABLET | Freq: Four times a day (QID) | ORAL | 1 refills | Status: DC | PRN

## 2019-01-15 MED ORDER — LACTATED RINGERS IV SOLN
INTRAVENOUS | Status: DC
  Administered 2019-01-15 (×2): via INTRAVENOUS

## 2019-01-15 MED ORDER — LEVOTHYROXINE SODIUM 25 MCG PO TABS: 137.0000 ug | ORAL_TABLET | Freq: Every day | ORAL | Status: DC

## 2019-01-15 MED ORDER — ALUM & MAG HYDROXIDE-SIMETH 200-200-20 MG/5ML PO SUSP: 30.0000 mL | ORAL | Status: DC | PRN

## 2019-01-15 MED ORDER — SORBITOL 70 % SOLN: 30.0000 mL | Freq: Every day | Status: DC | PRN

## 2019-01-15 MED ORDER — BUPIVACAINE-EPINEPHRINE (PF) 0.5% -1:200000 IJ SOLN
INTRAMUSCULAR | Status: DC | PRN
  Administered 2019-01-15: 20 mL via PERINEURAL

## 2019-01-15 MED ORDER — INSULIN ASPART 100 UNIT/ML ~~LOC~~ SOLN
0.0000 [IU] | Freq: Every day | SUBCUTANEOUS | Status: DC
  Administered 2019-01-15: 22:00:00 2 [IU] via SUBCUTANEOUS
  Administered 2019-01-16: 5 [IU] via SUBCUTANEOUS
  Administered 2019-01-18: 2 [IU] via SUBCUTANEOUS

## 2019-01-15 MED ORDER — PROPOFOL 10 MG/ML IV BOLUS
INTRAVENOUS | Status: AC
  Filled 2019-01-15: qty 20

## 2019-01-15 MED ORDER — CEFAZOLIN SODIUM-DEXTROSE 2-4 GM/100ML-% IV SOLN: 2.0000 g | Freq: Four times a day (QID) | INTRAVENOUS | Status: DC

## 2019-01-15 MED ORDER — SERTRALINE HCL 100 MG PO TABS
100.0000 mg | ORAL_TABLET | Freq: Every day | ORAL | Status: DC
  Administered 2019-01-16 – 2019-01-18 (×3): 100 mg via ORAL
  Filled 2019-01-15 (×3): qty 1

## 2019-01-15 MED ORDER — BUPIVACAINE IN DEXTROSE 0.75-8.25 % IT SOLN
INTRATHECAL | Status: DC | PRN
  Administered 2019-01-15: 1.4 mL via INTRATHECAL

## 2019-01-15 MED ORDER — SODIUM CHLORIDE 0.9 % IV SOLN
INTRAVENOUS | Status: DC | PRN
  Administered 2019-01-15: 50 ug/min via INTRAVENOUS

## 2019-01-15 MED ORDER — VANCOMYCIN HCL IN DEXTROSE 1-5 GM/200ML-% IV SOLN
1000.0000 mg | Freq: Once | INTRAVENOUS | Status: AC
  Administered 2019-01-15: 22:00:00 1000 mg via INTRAVENOUS
  Filled 2019-01-15: qty 200

## 2019-01-15 MED ORDER — FENTANYL CITRATE (PF) 100 MCG/2ML IJ SOLN
INTRAMUSCULAR | Status: AC
  Administered 2019-01-15: 11:00:00 50 ug via INTRAVENOUS
  Filled 2019-01-15: qty 2

## 2019-01-15 MED ORDER — ASPIRIN 81 MG PO CHEW
81.0000 mg | CHEWABLE_TABLET | Freq: Two times a day (BID) | ORAL | Status: DC
  Administered 2019-01-15 – 2019-01-18 (×7): 81 mg via ORAL
  Filled 2019-01-15 (×8): qty 1

## 2019-01-15 MED ORDER — KETOROLAC TROMETHAMINE 15 MG/ML IJ SOLN
15.0000 mg | Freq: Four times a day (QID) | INTRAMUSCULAR | Status: AC
  Administered 2019-01-15 – 2019-01-16 (×3): 15 mg via INTRAVENOUS
  Filled 2019-01-15 (×3): qty 1

## 2019-01-15 MED ORDER — OXYCODONE HCL ER 10 MG PO T12A: 10.0000 mg | EXTENDED_RELEASE_TABLET | Freq: Two times a day (BID) | ORAL | 0 refills | Status: AC

## 2019-01-15 MED ORDER — ONDANSETRON HCL 4 MG/2ML IJ SOLN
4.0000 mg | Freq: Four times a day (QID) | INTRAMUSCULAR | Status: DC | PRN
  Administered 2019-01-15: 15:00:00 4 mg via INTRAVENOUS
  Filled 2019-01-15: qty 2

## 2019-01-15 MED ORDER — BUPIVACAINE-EPINEPHRINE 0.25% -1:200000 IJ SOLN
INTRAMUSCULAR | Status: DC | PRN
  Administered 2019-01-15: 20 mL

## 2019-01-15 MED ORDER — MENTHOL 3 MG MT LOZG: 1.0000 | LOZENGE | OROMUCOSAL | Status: DC | PRN

## 2019-01-15 MED ORDER — ONDANSETRON HCL 4 MG PO TABS: 4.0000 mg | ORAL_TABLET | Freq: Four times a day (QID) | ORAL | Status: DC | PRN

## 2019-01-15 MED ORDER — MAGNESIUM CITRATE PO SOLN: 1.0000 | Freq: Once | ORAL | Status: DC | PRN

## 2019-01-15 MED ORDER — PHENOL 1.4 % MT LIQD: 1.0000 | OROMUCOSAL | Status: DC | PRN

## 2019-01-15 MED ORDER — METOCLOPRAMIDE HCL 5 MG PO TABS: 5.0000 mg | ORAL_TABLET | Freq: Three times a day (TID) | ORAL | Status: DC | PRN

## 2019-01-15 MED ORDER — INSULIN ASPART 100 UNIT/ML ~~LOC~~ SOLN: 6.0000 [IU] | Freq: Three times a day (TID) | SUBCUTANEOUS | Status: DC

## 2019-01-15 MED ORDER — METHOCARBAMOL 1000 MG/10ML IJ SOLN
500.0000 mg | Freq: Four times a day (QID) | INTRAVENOUS | Status: DC | PRN
  Filled 2019-01-15: qty 5

## 2019-01-15 MED ORDER — HYDROMORPHONE HCL 1 MG/ML IJ SOLN
0.5000 mg | INTRAMUSCULAR | Status: DC | PRN
  Administered 2019-01-17 (×2): 1 mg via INTRAVENOUS
  Filled 2019-01-15 (×2): qty 1

## 2019-01-15 MED ORDER — VANCOMYCIN HCL 1000 MG IV SOLR
INTRAVENOUS | Status: AC
  Filled 2019-01-15: qty 1000

## 2019-01-15 MED ORDER — ACETAMINOPHEN 500 MG PO TABS
1000.0000 mg | ORAL_TABLET | Freq: Once | ORAL | Status: AC
  Administered 2019-01-15: 09:00:00 1000 mg via ORAL

## 2019-01-15 MED ORDER — SIMVASTATIN 20 MG PO TABS
40.0000 mg | ORAL_TABLET | Freq: Every evening | ORAL | Status: DC
  Administered 2019-01-15 – 2019-01-18 (×4): 40 mg via ORAL
  Filled 2019-01-15 (×4): qty 2

## 2019-01-15 MED ORDER — PROPOFOL 500 MG/50ML IV EMUL
INTRAVENOUS | Status: DC | PRN
  Administered 2019-01-15: 75 ug/kg/min via INTRAVENOUS

## 2019-01-15 SURGICAL SUPPLY — 79 items
ALCOHOL ISOPROPYL (RUBBING) (MISCELLANEOUS) ×2 IMPLANT
BAG DECANTER FOR FLEXI CONT (MISCELLANEOUS) ×2 IMPLANT
BANDAGE ELASTIC 6 VELCRO ST LF (GAUZE/BANDAGES/DRESSINGS) ×2 IMPLANT
BANDAGE ESMARK 6X9 LF (GAUZE/BANDAGES/DRESSINGS) ×1 IMPLANT
BLADE SAW SGTL 13.0X1.19X90.0M (BLADE) ×2 IMPLANT
BNDG COHESIVE 6X5 TAN NS LF (GAUZE/BANDAGES/DRESSINGS) ×2 IMPLANT
BNDG ELASTIC 6X10 VLCR STRL LF (GAUZE/BANDAGES/DRESSINGS) ×2 IMPLANT
BNDG ESMARK 6X9 LF (GAUZE/BANDAGES/DRESSINGS) ×2
BOWL SMART MIX CTS (DISPOSABLE) ×2 IMPLANT
CEMENT BONE REFOBACIN R1X40 US (Cement) ×4 IMPLANT
CLSR STERI-STRIP ANTIMIC 1/2X4 (GAUZE/BANDAGES/DRESSINGS) ×4 IMPLANT
COVER SURGICAL LIGHT HANDLE (MISCELLANEOUS) ×2 IMPLANT
COVER WAND RF STERILE (DRAPES) IMPLANT
CUFF TOURN SGL QUICK 34 (TOURNIQUET CUFF) ×1
CUFF TOURNIQUET SINGLE 44IN (TOURNIQUET CUFF) IMPLANT
CUFF TRNQT CYL 34X4.125X (TOURNIQUET CUFF) ×1 IMPLANT
DRAPE EXTREMITY T 121X128X90 (DISPOSABLE) ×2 IMPLANT
DRAPE HALF SHEET 40X57 (DRAPES) ×2 IMPLANT
DRAPE INCISE IOBAN 66X45 STRL (DRAPES) IMPLANT
DRAPE ORTHO SPLIT 77X108 STRL (DRAPES) ×2
DRAPE POUCH INSTRU U-SHP 10X18 (DRAPES) ×2 IMPLANT
DRAPE SURG ORHT 6 SPLT 77X108 (DRAPES) ×2 IMPLANT
DRAPE U-SHAPE 47X51 STRL (DRAPES) ×4 IMPLANT
DRILL PIN HEADLESS TROCAR 3X75 (PIN) ×6 IMPLANT
DURAPREP 26ML APPLICATOR (WOUND CARE) ×6 IMPLANT
ELECT CAUTERY BLADE 6.4 (BLADE) ×2 IMPLANT
ELECT REM PT RETURN 9FT ADLT (ELECTROSURGICAL) ×2
ELECTRODE REM PT RTRN 9FT ADLT (ELECTROSURGICAL) ×1 IMPLANT
FEMUR  CMT CCR STD SZ10 L KNEE (Knees) ×1 IMPLANT
FEMUR CMT CCR STD SZ10 L KNEE (Knees) ×1 IMPLANT
FEMUR CMTD CR PERS STD SZ 5 RT (Knees) ×1 IMPLANT
GAUZE SPONGE 4X4 12PLY STRL LF (GAUZE/BANDAGES/DRESSINGS) ×2 IMPLANT
GAUZE XEROFORM 5X9 LF (GAUZE/BANDAGES/DRESSINGS) ×2 IMPLANT
GLOVE BIOGEL PI IND STRL 7.0 (GLOVE) ×1 IMPLANT
GLOVE BIOGEL PI INDICATOR 7.0 (GLOVE) ×1
GLOVE ECLIPSE 7.0 STRL STRAW (GLOVE) ×6 IMPLANT
GLOVE SKINSENSE NS SZ7.5 (GLOVE) ×1
GLOVE SKINSENSE STRL SZ7.5 (GLOVE) ×1 IMPLANT
GLOVE SURG SYN 7.5  E (GLOVE) ×4
GLOVE SURG SYN 7.5 E (GLOVE) ×4 IMPLANT
GOWN STRL REIN XL XLG (GOWN DISPOSABLE) ×2 IMPLANT
GOWN STRL REUS W/ TWL LRG LVL3 (GOWN DISPOSABLE) ×1 IMPLANT
GOWN STRL REUS W/TWL LRG LVL3 (GOWN DISPOSABLE) ×1
HANDPIECE INTERPULSE COAX TIP (DISPOSABLE) ×1
HOOD PEEL AWAY FLYTE STAYCOOL (MISCELLANEOUS) ×4 IMPLANT
INSERT TIB AS PERS SZ GH 12 (Insert) ×2 IMPLANT
KIT BASIN OR (CUSTOM PROCEDURE TRAY) ×2 IMPLANT
KIT TURNOVER KIT B (KITS) ×2 IMPLANT
MANIFOLD NEPTUNE II (INSTRUMENTS) ×2 IMPLANT
MARKER SKIN DUAL TIP RULER LAB (MISCELLANEOUS) ×2 IMPLANT
NEEDLE SPNL 18GX3.5 QUINCKE PK (NEEDLE) ×4 IMPLANT
NS IRRIG 1000ML POUR BTL (IV SOLUTION) ×2 IMPLANT
PACK TOTAL JOINT (CUSTOM PROCEDURE TRAY) ×2 IMPLANT
PAD ABD 8X10 STRL (GAUZE/BANDAGES/DRESSINGS) ×6 IMPLANT
PAD ARMBOARD 7.5X6 YLW CONV (MISCELLANEOUS) ×4 IMPLANT
PADDING CAST COTTON 6X4 STRL (CAST SUPPLIES) ×2 IMPLANT
SAW OSC TIP CART 19.5X105X1.3 (SAW) ×2 IMPLANT
SET HNDPC FAN SPRY TIP SCT (DISPOSABLE) ×1 IMPLANT
STAPLER VISISTAT 35W (STAPLE) IMPLANT
STEM POLY PAT PLY 32M KNEE (Knees) ×2 IMPLANT
STEM TIB ST PERS 14+30 (Stem) ×2 IMPLANT
STEM TIBIA 5 DEG SZ G L KNEE (Knees) ×1 IMPLANT
SUCTION FRAZIER HANDLE 10FR (MISCELLANEOUS) ×1
SUCTION TUBE FRAZIER 10FR DISP (MISCELLANEOUS) ×1 IMPLANT
SUT ETHILON 2 0 FS 18 (SUTURE) IMPLANT
SUT ETHILON 3 0 FSL (SUTURE) ×4 IMPLANT
SUT MNCRL AB 4-0 PS2 18 (SUTURE) ×2 IMPLANT
SUT VIC AB 0 CT1 27 (SUTURE) ×2
SUT VIC AB 0 CT1 27XBRD ANBCTR (SUTURE) ×2 IMPLANT
SUT VIC AB 1 CTX 27 (SUTURE) ×4 IMPLANT
SUT VIC AB 2-0 CT1 27 (SUTURE) ×2
SUT VIC AB 2-0 CT1 TAPERPNT 27 (SUTURE) ×2 IMPLANT
SYR 50ML LL SCALE MARK (SYRINGE) ×4 IMPLANT
TIBIA STEM 5 DEG SZ G L KNEE (Knees) ×2 IMPLANT
TOWEL GREEN STERILE (TOWEL DISPOSABLE) ×2 IMPLANT
TOWEL GREEN STERILE FF (TOWEL DISPOSABLE) ×2 IMPLANT
TRAY CATH 16FR W/PLASTIC CATH (SET/KITS/TRAYS/PACK) IMPLANT
UNDERPAD 30X30 (UNDERPADS AND DIAPERS) ×2 IMPLANT
WRAP KNEE MAXI GEL POST OP (GAUZE/BANDAGES/DRESSINGS) ×2 IMPLANT

## 2019-01-15 NOTE — Transfer of Care (Signed)
Immediate Anesthesia Transfer of Care Note  Patient: Toni Hill  Procedure(s) Performed: LEFT TOTAL KNEE ARTHROPLASTY (Left Knee)  Patient Location: PACU  Anesthesia Type:Spinal and MAC combined with regional for post-op pain  Level of Consciousness: awake and patient cooperative  Airway & Oxygen Therapy: Patient Spontanous Breathing and Patient connected to nasal cannula oxygen  Post-op Assessment: Report given to RN, Post -op Vital signs reviewed and stable and Patient moving all extremities  Post vital signs: Reviewed and stable  Last Vitals:  Vitals Value Taken Time  BP 105/64 01/15/19 1356  Temp    Pulse 91 01/15/19 1400  Resp 15 01/15/19 1400  SpO2 97 % 01/15/19 1400  Vitals shown include unvalidated device data.  Last Pain:  Vitals:   01/15/19 0841  TempSrc: Oral  PainSc: 8       Patients Stated Pain Goal: 3 (98/33/82 5053)  Complications: No apparent anesthesia complications

## 2019-01-15 NOTE — Progress Notes (Signed)
Orthopedic Tech Progress Note Patient Details:  Toni Hill Dec 18, 1945 [id number]  CPM Left Knee CPM Left Knee: On     [name] 01/15/2019, 2:14 PM

## 2019-01-15 NOTE — Evaluation (Signed)
Physical Therapy Evaluation Patient Details Name: Toni Hill MRN: [id number] DOB: 1945/08/21 Today's Date: 01/15/2019   History of Present Illness  Pt is a 73 y/o female s/p L TKA. PMH includes bipolar disorder, HTN, DM, CKD, and fibromyalgia.   Clinical Impression  Pt is s/p surgery above with deficits below. Pt requiring min A to perform transfer to [facility] and then to chair using RW secondary to L knee buckling. Pt also with increased nausea which limited further mobility. Pt reports her plan is to go to SNF upon d/c. Will continue to follow acutely to maximize functional mobility independence and safety.     Follow Up Recommendations Follow surgeon's recommendation for DC plan and follow-up therapies;Supervision for mobility/OOB    Equipment Recommendations  None recommended by PT    Recommendations for Other Services       Precautions / Restrictions Precautions Precautions: Knee Precaution Booklet Issued: No Restrictions Weight Bearing Restrictions: Yes LLE Weight Bearing: Weight bearing as tolerated      Mobility  Bed Mobility Overal bed mobility: Needs Assistance Bed Mobility: Sit to Supine       Sit to supine: Supervision   General bed mobility comments: Supervision for safety. Increased time required.   Transfers Overall transfer level: Needs assistance Equipment used: Rolling walker (2 wheeled) Transfers: Sit to/from [facility] Sit to Stand: Min assist Stand pivot transfers: Min assist       General transfer comment: Min A for lift assist and steadying to stand. Cues for safe hand placement, however, pt continued to pull up from RW. Performed stand pivot transfer to [facility] and then to recliner. L knee buckling noted, requiring min A for steadying. Pt also reporting increased nausea which limited further mobility.   Ambulation/Gait                Stairs            Wheelchair Mobility    Modified Rankin (Stroke Patients  Only)       Balance Overall balance assessment: Needs assistance Sitting-balance support: No upper extremity supported;Feet supported Sitting balance-Leahy Scale: Good     Standing balance support: Bilateral upper extremity supported;During functional activity Standing balance-Leahy Scale: Poor Standing balance comment: Reliant on BUE support                              Pertinent Vitals/Pain Pain Assessment: Faces Faces Pain Scale: Hurts even more Pain Location: L knee  Pain Descriptors / Indicators: Aching;Operative site guarding Pain Intervention(s): Limited activity within patient's tolerance;Monitored during session;Repositioned    Home Living Family/patient expects to be discharged to:: Skilled nursing facility Living Arrangements: Spouse/significant other                    Prior Function Level of Independence: Independent         Comments: Pt reports she was independent, however, had multiple falls     Hand Dominance        Extremity/Trunk Assessment   Upper Extremity Assessment Upper Extremity Assessment: Overall WFL for tasks assessed    Lower Extremity Assessment Lower Extremity Assessment: LLE deficits/detail LLE Deficits / Details: Deficits consistent with post op pain and weakness. Knee buckling noted during functional mobility tasks.     Cervical / Trunk Assessment Cervical / Trunk Assessment: Normal  Communication   Communication: No difficulties  Cognition Arousal/Alertness: Awake/alert Behavior During Therapy: WFL for tasks assessed/performed Overall Cognitive  Status: Within Functional Limits for tasks assessed                                        General Comments General comments (skin integrity, edema, etc.): Pt reports plan is to go to SNF at d/c     Exercises     Assessment/Plan    PT Assessment Patient needs continued PT services  PT Problem List Decreased strength;Decreased  balance;Decreased mobility;Decreased activity tolerance;Decreased coordination;Decreased knowledge of use of DME;Decreased knowledge of precautions;Pain       PT Treatment Interventions Gait training;DME instruction;Functional mobility training;Balance training;Therapeutic exercise;Therapeutic activities;Patient/family education    PT Goals (Current goals can be found in the Care Plan section)  Acute Rehab PT Goals Patient Stated Goal: to go to rehab then go home PT Goal Formulation: With patient Time For Goal Achievement: 01/29/19 Potential to Achieve Goals: Good    Frequency 7X/week   Barriers to discharge        Co-evaluation               AM-PAC PT "6 Clicks" Mobility  Outcome Measure Help needed turning from your back to your side while in a flat bed without using bedrails?: None Help needed moving from lying on your back to sitting on the side of a flat bed without using bedrails?: A Little Help needed moving to and from a bed to a chair (including a wheelchair)?: A Little Help needed standing up from a chair using your arms (e.g., wheelchair or bedside chair)?: A Little Help needed to walk in hospital room?: A Lot Help needed climbing 3-5 steps with a railing? : A Lot 6 Click Score: 17    End of Session Equipment Utilized During Treatment: Gait belt Activity Tolerance: Treatment limited secondary to medical complications (Comment)(nausea) Patient left: in chair;with call bell/phone within reach Nurse Communication: Mobility status;Other (comment)(pt with nausea ) PT Visit Diagnosis: Unsteadiness on feet (R26.81);History of falling (Z91.81);Repeated falls (R29.6);Pain;Muscle weakness (generalized) (M62.81) Pain - Right/Left: Left Pain - part of body: Knee    Time: 1191-4782 PT Time Calculation (min) (ACUTE ONLY): 21 min   Charges:   PT Evaluation $PT Eval Low Complexity: [place], PT, DPT  Acute Rehabilitation Services   Pager: [phone] Office: [phone]   Rudean Hitt 01/15/2019, 6:54 PM

## 2019-01-15 NOTE — Anesthesia Procedure Notes (Signed)
Anesthesia Regional Block: Adductor canal block   Pre-Anesthetic Checklist: ,, timeout performed, Correct Patient, Correct Site, Correct Laterality, Correct Procedure, Correct Position, site marked, Risks and benefits discussed,  Surgical consent,  Pre-op evaluation,  At surgeon's request and post-op pain management  Laterality: Left  Prep: Maximum Sterile Barrier Precautions used, chloraprep       Needles:  Injection technique: Single-shot  Needle Type: Echogenic Stimulator Needle     Needle Length: 9cm  Needle Gauge: 22     Additional Needles:   Procedures:,,,, ultrasound used (permanent image in chart),,,,  Narrative:  Start time: 01/15/2019 10:40 AM End time: 01/15/2019 10:50 AM Injection made incrementally with aspirations every 5 mL.  Performed by: Personally  Anesthesiologist: Freddrick March, MD  Additional Notes: Monitors applied. No increased pain on injection. No increased resistance to injection. Injection made in 5cc increments. Good needle visualization. Patient tolerated procedure well.

## 2019-01-15 NOTE — Discharge Instructions (Signed)

## 2019-01-15 NOTE — Anesthesia Procedure Notes (Signed)
Spinal  Patient location during procedure: OR Start time: 01/15/2019 11:09 AM End time: 01/15/2019 11:19 AM Staffing Anesthesiologist: Freddrick March, MD Performed: anesthesiologist  Preanesthetic Checklist Completed: patient identified, surgical consent, pre-op evaluation, timeout performed, IV checked, risks and benefits discussed and monitors and equipment checked Spinal Block Patient position: sitting Prep: site prepped and draped and DuraPrep Patient monitoring: cardiac monitor, continuous pulse ox and blood pressure Approach: midline Location: L3-4 Injection technique: single-shot Needle Needle type: Pencan  Needle gauge: 24 G Needle length: 9 cm Assessment Sensory level: T6 Additional Notes Functioning IV was confirmed and monitors were applied. Sterile prep and drape, including hand hygiene and sterile gloves were used. The patient was positioned and the spine was prepped. The skin was anesthetized with lidocaine.  Free flow of clear CSF was obtained prior to injecting local anesthetic into the CSF.  The spinal needle aspirated freely following injection.  The needle was carefully withdrawn.  The patient tolerated the procedure well.

## 2019-01-15 NOTE — Op Note (Signed)
Total Knee Arthroplasty Procedure Note  Preoperative diagnosis: Left knee osteoarthritis  Postoperative diagnosis:same  Operative procedure: Left total knee arthroplasty. CPT [phone]  Surgeon: N. Eduard Roux, MD  Assist: Madalyn Rob, PA-C; necessary for the timely completion of procedure and due to complexity of procedure.  Anesthesia: Spinal, regional  Tourniquet time: see anesthesia record  Implants used: Zimmer persona Femur: PS 10 Tibia: G, 30 mm stem Patella: 32 mm Polyethylene: 12 mm constrained  Indication: Toni Hill is a 73 y.o. year old female with a history of knee pain. Having failed conservative management, the patient elected to proceed with a total knee arthroplasty.  We have reviewed the risk and benefits of the surgery and they elected to proceed after voicing understanding.  Procedure:  After informed consent was obtained and understanding of the risk were voiced including but not limited to bleeding, infection, damage to surrounding structures including nerves and vessels, blood clots, leg length inequality and the failure to achieve desired results, the operative extremity was marked with verbal confirmation of the patient in the holding area.   The patient was then brought to the operating room and transported to the operating room table in the supine position.  A tourniquet was applied to the operative extremity around the upper thigh. The operative limb was then prepped and draped in the usual sterile fashion and preoperative antibiotics were administered.  A time out was performed prior to the start of surgery confirming the correct extremity, preoperative antibiotic administration, as well as team members, implants and instruments available for the case. Correct surgical site was also confirmed with preoperative radiographs. The limb was then elevated for exsanguination and the tourniquet was inflated. A midline incision was made and a standard  medial parapatellar approach was performed.  The patella was prepared and sized to a 32 mm.  A cover was placed on the patella for protection from retractors.  We then turned our attention to the femur. Posterior cruciate ligament was sacrificed. Start site was drilled in the femur and the intramedullary distal femoral cutting guide was placed, set at 5 degrees valgus, taking 12 mm of distal resection. The distal cut was made. Osteophytes were then removed.  Extension gap was then checked. Next, the proximal tibial cutting guide was placed with appropriate slope, varus/valgus alignment and depth of resection. The proximal tibial cut was made. Gap blocks were then used to assess the extension gap and alignment, and appropriate soft tissue releases were performed. Attention was turned back to the femur, which was sized using the sizing guide to a size 10. Appropriate rotation of the femoral component was determined using epicondylar axis, Whiteside's line, and assessing the flexion gap under ligament tension. The appropriate size 4-in-1 cutting block was placed and cuts were made. Posterior femoral osteophytes and uncapped bone were then removed with the curved osteotome.  Trial components were placed, and stability was checked in full extension, mid-flexion, and deep flexion. Proper tibial rotation was determined and marked.  The patella tracked well without a lateral release. Trial components were then removed and tibial preparation performed.  The tibia was sized for a size G component.  I felt that she had a moderate amount of gapping in mid flexion with valgus stress therefore I decided to use a constrained liner.  A posterior capsular injection comprising of 20 cc of 1.3% exparel, 20 cc of 0.25% bupivicaine with epi and 20 cc of normal saline was performed for postoperative pain control. The bony  surfaces were irrigated with a pulse lavage and then dried. Bone cement was vacuum mixed on the back table, and  the final components sized above were cemented into place. After cement had finished curing, excess cement was removed. The stability of the construct was re-evaluated throughout a range of motion and found to be acceptable. The trial liner was removed, the knee was copiously irrigated, and the knee was re-evaluated for any excess bone debris. The real polyethylene liner, 12 mm thick, was inserted and checked to ensure the locking mechanism had engaged appropriately. The tourniquet was deflated and hemostasis was achieved. The wound was irrigated with normal saline.  One gram of vancomycin powder was placed in the surgical bed.  Capsular closure was performed with a #1 vicryl, subcutaneous fat closed with a 0 vicryl suture, then subcutaneous tissue closed with interrupted 2.0 vicryl suture. The skin was then closed with a 3.0 nylon. A sterile dressing was applied.  The patient was awakened in the operating room and taken to recovery in stable condition. All sponge, needle, and instrument counts were correct at the end of the case.  Position: supine  Complications: none.  Time Out: performed   Drains/Packing: none  Estimated blood loss: minimal  Returned to Recovery Room: in good condition.   Antibiotics: yes   Mechanical VTE (DVT) Prophylaxis: sequential compression devices, TED thigh-high  Chemical VTE (DVT) Prophylaxis: aspirin  Fluid Replacement  Crystalloid: see anesthesia record Blood: none  FFP: none   Specimens Removed: 1 to pathology   Sponge and Instrument Count Correct? yes   PACU: portable radiograph - knee AP and Lateral   Plan/RTC: Return in 2 weeks for wound check.   Weight Bearing/Load Lower Extremity: full   N. Eduard Roux, MD [place] 1:22 PM

## 2019-01-15 NOTE — H&P (Signed)
PREOPERATIVE H&P  Chief Complaint: left knee degenerative joint disease  HPI: Toni Hill is a 73 y.o. female who presents for surgical treatment of left knee degenerative joint disease.  She denies any changes in medical history.  Past Medical History:  Diagnosis Date  . Bilateral lower extremity edema    right > left  . Bipolar 1 disorder ([place])   . CKD (chronic kidney disease) stage 3, GFR 30-59 ml/min [facility])    nephrologist-  BMI nephrology in [facility]  . Diabetes, polyneuropathy ([place])    FEET AND FINGER TIPS  . Fibromyalgia   . GERD (gastroesophageal reflux disease)   . History of chronic gastritis   . Hyperlipidemia   . Hypertension    per pt was take off bp medication because of kidney disease told by her nephrologist  . Hypothyroidism   . Mixed incontinence urge and stress   . OA (osteoarthritis)    KNEES  . OAB (overactive bladder)   . PONV (postoperative nausea and vomiting)   . 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 Type 2 diabetes mellitus treated with insulin ([place])   . Wears glasses    Past Surgical History:  Procedure Laterality Date  . CHOLECYSTECTOMY OPEN  1990's  . COLONOSCOPY  last one 07-17-2014  . CYSTOSCOPY WITH INJECTION N/A 10/29/2016   Procedure: CYSTOSCOPY WITH INJECTION BOTOX 100 UNITS, urethral dilation;  Surgeon: Carolan Clines, MD;  Location: [facility];  Service: Urology;  Laterality: N/A;  . ESOPHAGOGASTRODUODENOSCOPY  last one 07-01-2016  . EXCISIONAL BREAST BX  1990   BENIGN  . ORIF TIBIA & FIBULA FRACTURES  2010   retained rod  . TONSILLECTOMY  age 58  . VAGINAL HYSTERECTOMY  1979  . VENTRAL HERNIA REPAIR  1990's   Social History   Socioeconomic History  . Marital status: Married    Spouse name: Not on file  . Number of children: Not on file  . Years of education: Not on file  . Highest education level: Not on file  Occupational History  . Not on file  Social Needs  . Financial resource  strain: Not on file  . Food insecurity    Worry: Not on file    Inability: Not on file  . Transportation needs    Medical: Not on file    Non-medical: Not on file  Tobacco Use  . Smoking status: Never Smoker  . Smokeless tobacco: Never Used  Substance and Sexual Activity  . Alcohol use: No  . Drug use: No  . Sexual activity: Not Currently    Comment: 1st intercourse 73 yo-Fewer than 5 partners  Lifestyle  . Physical activity    Days per week: Not on file    Minutes per session: Not on file  . Stress: Not on file  Relationships  . Social Herbalist on phone: Not on file    Gets together: Not on file    Attends religious service: Not on file    Active member of club or organization: Not on file    Attends meetings of clubs or organizations: Not on file    Relationship status: Not on file  Other Topics Concern  . Not on file  Social History Narrative  . Not on file   Family History  Problem Relation Age of Onset  . Breast cancer Mother 78  . Cancer Father        ? type  .  Breast cancer Sister 48  . Breast cancer Maternal Grandmother        ? age   Allergies  Allergen Reactions  . Morphine And Related Nausea And Vomiting  . Vioxx [Rofecoxib]     GI bleeding  . Penicillins Hives and Itching    Has patient had a PCN reaction causing immediate rash, facial/tongue/throat swelling, SOB or lightheadedness with hypotension: No Has patient had a PCN reaction causing severe rash involving mucus membranes or skin necrosis: No Has patient had a PCN reaction that required hospitalization: no Has patient had a PCN reaction occurring within the last 10 years: No If all of the above answers are "NO", then may proceed with Cephalosporin use.    Prior to Admission medications   Medication Sig Start Date End Date Taking? Authorizing Provider  acetaminophen (TYLENOL) 500 MG tablet Take 1,000 mg by mouth every 8 (eight) hours as needed (pain).   Yes [provider]   aspirin 81 MG chewable tablet Chew 81 mg by mouth daily.    Yes [provider]  Calcium Carb-Cholecalciferol (CALCIUM 600/VITAMIN D3) 600-800 MG-UNIT TABS Take 1 tablet by mouth daily.    Yes [provider]  Cholecalciferol (EQL VITAMIN D3) 50 MCG (2000 UT) CAPS Take 2,000 Units by mouth daily.    Yes [provider]  ezetimibe (ZETIA) 10 MG tablet Take 10 mg by mouth every evening.    Yes [provider]  gabapentin (NEURONTIN) 100 MG capsule Take 1 capsule (100 mg total) by mouth 2 (two) times daily. 12/28/18  Yes Isabelle Course, MD  hydrocortisone 2.5 % cream Apply 1 application topically 2 (two) times daily as needed (dry skin).   Yes [provider]  insulin aspart (NOVOLOG) 100 UNIT/ML injection Inject 6-8 Units into the skin 3 (three) times daily with meals.    Yes [provider]  Insulin Glargine (BASAGLAR KWIKPEN) 100 UNIT/ML SOPN Inject 25 Units into the skin every morning.   Yes [provider]  levothyroxine (SYNTHROID, LEVOTHROID) 137 MCG tablet Take 137 mcg by mouth daily before breakfast.   Yes [provider]  lisinopril (ZESTRIL) 10 MG tablet Take 10 mg by mouth daily. 12/29/18  Yes [provider]  sertraline (ZOLOFT) 100 MG tablet Take 100 mg by mouth daily.    Yes [provider]  simvastatin (ZOCOR) 40 MG tablet Take 40 mg by mouth every evening.    Yes [provider]  HYDROcodone-acetaminophen (NORCO) 5-325 MG tablet Take 1-2 tablets by mouth daily as needed (pain). 01/04/19   Mcarthur Rossetti, MD     Positive ROS: All other systems have been reviewed and were otherwise negative with the exception of those mentioned in the HPI and as above.  Physical Exam: General: Alert, no acute distress Cardiovascular: No pedal edema Respiratory: No cyanosis, no use of accessory musculature GI: abdomen soft Skin: No lesions in the area of chief complaint Neurologic: Sensation  intact distally Psychiatric: Patient is competent for consent with normal mood and affect Lymphatic: no lymphedema  MUSCULOSKELETAL: exam stable  Assessment: left knee degenerative joint disease  Plan: Plan for Procedure(s): LEFT TOTAL KNEE ARTHROPLASTY  The risks benefits and alternatives were discussed with the patient including but not limited to the risks of nonoperative treatment, versus surgical intervention including infection, bleeding, nerve injury,  blood clots, cardiopulmonary complications, morbidity, mortality, among others, and they were willing to proceed.   Preoperative templating of the joint replacement has been completed, documented,  and submitted to the Operating Room personnel in order to optimize intra-operative equipment management.  Anticipated LOS equal to or greater than 2 midnights due to - Age 56 and older with one or more of the following:  - Obesity  - Expected need for hospital services (PT, OT, Nursing) required for safe  discharge  - Anticipated need for postoperative skilled nursing care or inpatient rehab  - Active co-morbidities: None  Eduard Roux, MD   01/15/2019 8:40 AM

## 2019-01-16 ENCOUNTER — Telehealth: Payer: Self-pay | Admitting: Genetic Counselor

## 2019-01-16 ENCOUNTER — Encounter (HOSPITAL_COMMUNITY): Payer: Self-pay | Admitting: Orthopaedic Surgery

## 2019-01-16 ENCOUNTER — Ambulatory Visit: Payer: Medicare Other | Admitting: Licensed Clinical Social Worker

## 2019-01-16 LAB — CBC
HCT: 33.3 % — ABNORMAL LOW (ref 36.0–46.0)
Hemoglobin: 10.3 g/dL — ABNORMAL LOW (ref 12.0–15.0)
MCH: 32.6 pg (ref 26.0–34.0)
MCHC: 30.9 g/dL (ref 30.0–36.0)
MCV: 105.4 fL — ABNORMAL HIGH (ref 80.0–100.0)
Platelets: 203 10*3/uL (ref 150–400)
RBC: 3.16 MIL/uL — ABNORMAL LOW (ref 3.87–5.11)
RDW: 12.8 % (ref 11.5–15.5)
WBC: 6.5 10*3/uL (ref 4.0–10.5)
nRBC: 0 % (ref 0.0–0.2)

## 2019-01-16 LAB — GLUCOSE, CAPILLARY
Glucose-Capillary: 133 mg/dL — ABNORMAL HIGH (ref 70–99)
Glucose-Capillary: 177 mg/dL — ABNORMAL HIGH (ref 70–99)
Glucose-Capillary: 334 mg/dL — ABNORMAL HIGH (ref 70–99)
Glucose-Capillary: 381 mg/dL — ABNORMAL HIGH (ref 70–99)

## 2019-01-16 LAB — BASIC METABOLIC PANEL
Anion gap: 6 (ref 5–15)
BUN: 38 mg/dL — ABNORMAL HIGH (ref 8–23)
CO2: 23 mmol/L (ref 22–32)
Calcium: 8.4 mg/dL — ABNORMAL LOW (ref 8.9–10.3)
Chloride: 111 mmol/L (ref 98–111)
Creatinine, Ser: 1.65 mg/dL — ABNORMAL HIGH (ref 0.44–1.00)
GFR calc Af Amer: 36 mL/min — ABNORMAL LOW (ref >60)
GFR calc non Af Amer: 31 mL/min — ABNORMAL LOW (ref >60)
Glucose, Bld: 233 mg/dL — ABNORMAL HIGH (ref 70–99)
Potassium: 4.5 mmol/L (ref 3.5–5.1)
Sodium: 140 mmol/L (ref 135–145)

## 2019-01-16 MED ORDER — SODIUM CHLORIDE 0.9 % IV BOLUS
250.0000 mL | Freq: Once | INTRAVENOUS | Status: AC
  Administered 2019-01-16: 250 mL via INTRAVENOUS

## 2019-01-16 MED ORDER — LEVOTHYROXINE SODIUM 125 MCG PO TABS
125.0000 ug | ORAL_TABLET | Freq: Every day | ORAL | Status: DC
  Administered 2019-01-16 – 2019-01-18 (×3): 125 ug via ORAL
  Filled 2019-01-16 (×3): qty 1

## 2019-01-16 NOTE — Progress Notes (Signed)
Subjective: 1 Day Post-Op Procedure(s) (LRB): LEFT TOTAL KNEE ARTHROPLASTY (Left) Patient reports pain as mild.    Objective: Vital signs in last 24 hours: Temp:  [97.2 F (36.2 C)-98.4 F (36.9 C)] 98 F (36.7 C) (07/14 0538) Pulse Rate:  [69-93] 76 (07/14 0700) Resp:  [14-26] 16 (07/14 0538) BP: (91-151)/(39-80) 114/57 (07/14 0700) SpO2:  [92 %-100 %] 99 % (07/14 0538) Weight:  [103.9 kg] 103.9 kg (07/13 0841)  Intake/Output from previous day: 07/13 0701 - 07/14 0700 In: 2479.9 [I.V.:2479.9] Out: 880 [Urine:850; Blood:30] Intake/Output this shift: Total I/O In: -  Out: 800 [Urine:800]  Recent Labs    01/16/19 0251  HGB 10.3*   Recent Labs    01/16/19 0251  WBC 6.5  RBC 3.16*  HCT 33.3*  PLT 203   Recent Labs    01/16/19 0251  NA 140  K 4.5  CL 111  CO2 23  BUN 38*  CREATININE 1.65*  GLUCOSE 233*  CALCIUM 8.4*   No results for input(s): LABPT, INR in the last 72 hours.  Neurologically intact Neurovascular intact Sensation intact distally Intact pulses distally Dorsiflexion/Plantar flexion intact Incision: dressing C/D/I No cellulitis present Compartment soft   Assessment/Plan: 1 Day Post-Op Procedure(s) (LRB): LEFT TOTAL KNEE ARTHROPLASTY (Left) Advance diet Up with therapy Discharge to SNF once approved by insurance WBAT LLE ABLA- mild and stable Continue fluid at 50 ml/hr Dressing changed by me today Please apply thigh high ted hose to LLE D/c foley   Anticipated LOS equal to or greater than 2 midnights due to - Age 72 and older with one or more of the following:  - Obesity  - Expected need for hospital services (PT, OT, Nursing) required for safe  discharge  - Anticipated need for postoperative skilled nursing care or inpatient rehab  - Active co-morbidities: Diabetes OR   - Unanticipated findings during/Post Surgery: Slow post-op progression: GI, pain control, mobility  - Patient is a high risk of re-admission due to: Barriers  to post-acute care (logistical, no family support in home)    LIANA CAMERER 01/16/2019, 8:02 AM

## 2019-01-16 NOTE — Progress Notes (Signed)
Patients BP was 90s/50s and patient complaining of left leg cramp spasms. Did complain of headache which she states did go away. No complaints lightheadedness or dizziness. MD paged & was advised to give 236mL bolus and continue with PRN Robaxin. Patient is sleeping comfortably in the bed.

## 2019-01-16 NOTE — Telephone Encounter (Signed)
Called the pt on 7/14. She is in the hospital and will not be able to keep her appt.

## 2019-01-16 NOTE — Progress Notes (Signed)
Physical Therapy Treatment Patient Details Name: Toni Hill MRN: [id number] DOB: Jun 27, 1946 Today's Date: 01/16/2019    History of Present Illness Pt is a 73 y/o female s/p L TKA. PMH includes bipolar disorder, HTN, DM, CKD, and fibromyalgia.     PT Comments    Pt performed gt training and review of HEP in supine position.  She is progressing well.  She continues to require min assistance for mobility.  Presents with x1 minor LOB.  Continue to recommend SNF placement post operatively to improve strength and function before returning home.      Follow Up Recommendations  Follow surgeon's recommendation for DC plan and follow-up therapies;Supervision for mobility/OOB     Equipment Recommendations  None recommended by PT    Recommendations for Other Services       Precautions / Restrictions Precautions Precautions: Knee Precaution Booklet Issued: No Restrictions Weight Bearing Restrictions: Yes LLE Weight Bearing: Weight bearing as tolerated    Mobility  Bed Mobility Overal bed mobility: Needs Assistance Bed Mobility: Supine to Sit     Supine to sit: Supervision     General bed mobility comments: Supervision for safety. Increased time required.   Transfers Overall transfer level: Needs assistance Equipment used: Rolling walker (2 wheeled) Transfers: Sit to/from Stand   Stand pivot transfers: Min assist       General transfer comment: Cues for hand placement to and from seated surface.  Cues to keep RW close to person when sitting down vs. leaving to the side.  Ambulation/Gait Ambulation/Gait assistance: Min assist Gait Distance (Feet): 60 Feet Assistive device: Rolling walker (2 wheeled) Gait Pattern/deviations: Step-to pattern;Step-through pattern;Decreased stride length;Trunk flexed     General Gait Details: Cues for forward gaze.  Minor LOB in standing, required min assistance to correct.  Pt given cues for sequencing but slowly progressed to step  through pattern.  She fatigues quickly.   Stairs             Wheelchair Mobility    Modified Rankin (Stroke Patients Only)       Balance Overall balance assessment: Needs assistance Sitting-balance support: No upper extremity supported;Feet supported Sitting balance-Leahy Scale: Good       Standing balance-Leahy Scale: Poor                              Cognition Arousal/Alertness: Awake/alert Behavior During Therapy: WFL for tasks assessed/performed Overall Cognitive Status: Within Functional Limits for tasks assessed                                        Exercises Total Joint Exercises Ankle Circles/Pumps: AROM;Both;20 reps;Supine Quad Sets: AROM;Left;10 reps;Supine Heel Slides: AAROM;Left;10 reps;Supine Hip ABduction/ADduction: AROM;Left;10 reps;Supine Straight Leg Raises: AROM;Left;10 reps;Supine Goniometric ROM: grossly 10-85 degress L knee.    General Comments        Pertinent Vitals/Pain Pain Assessment: Faces Faces Pain Scale: Hurts little more Pain Location: L knee  Pain Descriptors / Indicators: Aching;Operative site guarding Pain Intervention(s): Monitored during session;Repositioned;Ice applied    Home Living                      Prior Function            PT Goals (current goals can now be found in the care plan section) Acute Rehab PT Goals Patient  Stated Goal: to go to rehab then go home PT Goal Formulation: With patient Potential to Achieve Goals: Good    Frequency    7X/week      PT Plan Current plan remains appropriate    Co-evaluation              AM-PAC PT "6 Clicks" Mobility   Outcome Measure  Help needed turning from your back to your side while in a flat bed without using bedrails?: None Help needed moving from lying on your back to sitting on the side of a flat bed without using bedrails?: A Little Help needed moving to and from a bed to a chair (including a  wheelchair)?: A Little Help needed standing up from a chair using your arms (e.g., wheelchair or bedside chair)?: A Little Help needed to walk in hospital room?: A Lot Help needed climbing 3-5 steps with a railing? : A Lot 6 Click Score: 17    End of Session Equipment Utilized During Treatment: Gait belt Activity Tolerance: Treatment limited secondary to medical complications (Comment) Patient left: in chair;with call bell/phone within reach Nurse Communication: Mobility status;Other (comment) PT Visit Diagnosis: Unsteadiness on feet (R26.81);History of falling (Z91.81);Repeated falls (R29.6);Pain;Muscle weakness (generalized) (M62.81) Pain - Right/Left: Left Pain - part of body: Knee     Time: 7939-6886 PT Time Calculation (min) (ACUTE ONLY): 26 min  Charges:  $Gait Training: 8-22 mins $Therapeutic Exercise: 8-22 mins                     Governor Rooks, PTA Acute Rehabilitation Services Pager [phone] Office [phone]     Toni Hill [name] 01/16/2019, 3:24 PM

## 2019-01-16 NOTE — Progress Notes (Addendum)
Physical Therapy Treatment Patient Details Name: Toni Hill MRN: [id number] DOB: May 09, 1946 Today's Date: 01/16/2019    History of Present Illness Pt is a 73 y/o female s/p L TKA. PMH includes bipolar disorder, HTN, DM, CKD, and fibromyalgia.     PT Comments    Pt performed gt training during session with max cues for encouragement.  She presents with increased pain and distance therefore limited.  Assisted patient back to bed and placed pt in CPM with all needs in reach.      Follow Up Recommendations  Follow surgeon's recommendation for DC plan and follow-up therapies;Supervision for mobility/OOB     Equipment Recommendations  None recommended by PT    Recommendations for Other Services       Precautions / Restrictions Precautions Precautions: Knee Precaution Booklet Issued: No Restrictions Weight Bearing Restrictions: Yes LLE Weight Bearing: Weight bearing as tolerated    Mobility  Bed Mobility Overal bed mobility: Needs Assistance Bed Mobility: Sit to Supine      Sit to supine: Supervision   General bed mobility comments: impulsive to return to be due to pain and fatigue,  Cues for safety to avoid pullin on IV line.  Transfers Overall transfer level: Needs assistance Equipment used: Rolling walker (2 wheeled) Transfers: Sit to/from Stand Sit to Stand: Min assist Stand pivot transfers: Min assist       General transfer comment: Cues for hand placement to and from seated surface.  Cues to keep RW close to person when sitting down vs. leaving to the side.  Ambulation/Gait Ambulation/Gait assistance: Min assist Gait Distance (Feet): 40 Feet Assistive device: Rolling walker (2 wheeled) Gait Pattern/deviations: Step-to pattern;Step-through pattern;Decreased stride length;Trunk flexed     General Gait Details: Pt moving quickly as to get gt trial over.  She required cues to slow pace and improve safety with gt trial.   Stairs              Wheelchair Mobility    Modified Rankin (Stroke Patients Only)       Balance Overall balance assessment: Needs assistance Sitting-balance support: No upper extremity supported;Feet supported Sitting balance-Leahy Scale: Good       Standing balance-Leahy Scale: Poor Standing balance comment: Reliant on BUE support , unsteady in standing.                            Cognition Arousal/Alertness: Awake/alert Behavior During Therapy: WFL for tasks assessed/performed Overall Cognitive Status: Within Functional Limits for tasks assessed                                        Exercises     General Comments        Pertinent Vitals/Pain Pain Assessment: 0-10 Pain Score: 7  Faces Pain Scale: Hurts little more Pain Location: L knee  Pain Descriptors / Indicators: Aching;Operative site guarding Pain Intervention(s): Premedicated before session    Home Living                      Prior Function            PT Goals (current goals can now be found in the care plan section) Acute Rehab PT Goals Patient Stated Goal: to go to rehab then go home PT Goal Formulation: With patient Potential to Achieve Goals: Good Progress towards PT  goals: Progressing toward goals    Frequency    7X/week      PT Plan Current plan remains appropriate    Co-evaluation              AM-PAC PT "6 Clicks" Mobility   Outcome Measure  Help needed turning from your back to your side while in a flat bed without using bedrails?: None Help needed moving from lying on your back to sitting on the side of a flat bed without using bedrails?: A Little Help needed moving to and from a bed to a chair (including a wheelchair)?: A Little Help needed standing up from a chair using your arms (e.g., wheelchair or bedside chair)?: A Little Help needed to walk in hospital room?: A Little Help needed climbing 3-5 steps with a railing? : A Little 6 Click Score:  19    End of Session Equipment Utilized During Treatment: Gait belt Activity Tolerance: Treatment limited secondary to medical complications (Comment) Patient left: with call bell/phone within reach;in bed;in CPM Nurse Communication: Mobility status;Other (comment) PT Visit Diagnosis: Unsteadiness on feet (R26.81);History of falling (Z91.81);Repeated falls (R29.6);Pain;Muscle weakness (generalized) (M62.81) Pain - Right/Left: Left Pain - part of body: Knee     Time: 6967-8938 PT Time Calculation (min) (ACUTE ONLY): 18 min  Charges:  $Gait Training: 8-22 mins                     Toni Hill, PTA Acute Rehabilitation Services Pager [phone] Office [phone]     Toni Hill 01/16/2019, 5:11 PM

## 2019-01-16 NOTE — Telephone Encounter (Signed)
Called patient regarding upcoming Web[facility] appointment, patient needs to cancel the appointment due to being in the hospital. Patient will call back when ready to reschedule.

## 2019-01-16 NOTE — Anesthesia Postprocedure Evaluation (Signed)
Anesthesia Post Note  Patient: Toni Hill  Procedure(s) Performed: LEFT TOTAL KNEE ARTHROPLASTY (Left Knee)     Patient location during evaluation: PACU Anesthesia Type: Regional and Spinal Level of consciousness: oriented and awake and alert Pain management: pain level controlled Vital Signs Assessment: post-procedure vital signs reviewed and stable Respiratory status: spontaneous breathing, respiratory function stable and patient connected to nasal cannula oxygen Cardiovascular status: blood pressure returned to baseline and stable Postop Assessment: no headache, no backache and no apparent nausea or vomiting Anesthetic complications: no    Last Vitals:  Vitals:   01/16/19 0700 01/16/19 0911  BP: (!) 114/57 (!) 116/50  Pulse: 76 75  Resp:  16  Temp:  37.2 C  SpO2:  95%    Last Pain:  Vitals:   01/16/19 1026  TempSrc:   PainSc: 0-No pain                 Kanna Dafoe L Debby Clyne

## 2019-01-17 ENCOUNTER — Other Ambulatory Visit: Payer: Medicare Other

## 2019-01-17 ENCOUNTER — Encounter: Payer: Medicare Other | Admitting: Genetic Counselor

## 2019-01-17 LAB — GLUCOSE, CAPILLARY
Glucose-Capillary: 257 mg/dL — ABNORMAL HIGH (ref 70–99)
Glucose-Capillary: 116 mg/dL — ABNORMAL HIGH (ref 70–99)
Glucose-Capillary: 204 mg/dL — ABNORMAL HIGH (ref 70–99)
Glucose-Capillary: 103 mg/dL — ABNORMAL HIGH (ref 70–99)

## 2019-01-17 NOTE — NC FL2 (Signed)
[place] LEVEL OF CARE SCREENING TOOL     IDENTIFICATION  Patient Name: Toni Hill Birthdate: 18-Nov-1945 Sex: female Admission Date (Current Location): 01/15/2019  [facility] and [place] Number:  Herbalist and Address:  [facility], [place] [address] [place], [place] [address]      Provider Number: [id number]  Attending Physician Name and Address:  Leandrew Koyanagi, MD  Relative Name and Phone Number:  Kiaira Pointer, spouse, [phone]    Current Level of Care: Hospital Recommended Level of Care: [place] Prior Approval Number:    Date Approved/Denied:   PASRR Number: [id number]  Discharge Plan: SNF    Current Diagnoses: Patient Active Problem List   Diagnosis Date Noted  . 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 Osteoarthritis of left knee 12/30/2018  . Anxiety 12/30/2018  . Stage 3 chronic kidney disease ([place]) 06/28/2016  . Hypercholesterolemia 08/20/2015  . Type 2 diabetes mellitus ([place]) 02/28/2014  . Esophageal reflux 02/28/2014  . Stress incontinence, female 02/28/2014  . Acquired hypothyroidism 02/28/2014  . Essential hypertension 11/17/2011    Orientation RESPIRATION BLADDER Height & Weight     Self, Time, Situation, Place  Normal, O2(intermittant nasal canula) Continent Weight: 229 lb 1.6 oz (103.9 kg) Height:  5\' 9"  (175.3 cm)  BEHAVIORAL SYMPTOMS/MOOD NEUROLOGICAL BOWEL NUTRITION STATUS      Continent Diet(see discharge summary)  AMBULATORY STATUS COMMUNICATION OF NEEDS Skin   Limited Assist   Other (Comment), Surgical wounds(incision on left leg with compression wrap and aquacel; generalized ecchymosis on bilateral hands and arms)                       Personal Care Assistance Level of Assistance  Bathing, Dressing, Feeding Bathing Assistance: Limited assistance Feeding assistance: Independent Dressing Assistance: Limited assistance     Functional Limitations  Info  Sight, Hearing, Speech Sight Info: Adequate Hearing Info: Adequate Speech Info: Adequate    SPECIAL CARE FACTORS FREQUENCY  OT (By licensed OT), PT (By licensed PT)     PT Frequency: 5x week OT Frequency: 5x week            Contractures Contractures Info: Not present    Additional Factors Info  Code Status, Allergies, Insulin Sliding Scale Code Status Info: Full Code Allergies Info: Morphine And Related, Vioxx (Rofecoxib),Penicillins   Insulin Sliding Scale Info: insulin aspart (novoLOG) injection 0-20 Units 3x daily with meals;insulin aspart (novoLOG) injection 0-20 Units daily at bedtime; insulin glargine (LANTUS) injection 25 Units daily       Current Medications (01/17/2019):  This is the current hospital active medication list Current Facility-Administered Medications  Medication Dose Route Frequency Provider Last Rate Last Dose  . 0.9 %  sodium chloride infusion   Intravenous Continuous Ellyssa, Zagal, PA-C 50 mL/hr at 01/16/19 9983    . acetaminophen (TYLENOL) tablet 325-650 mg  325-650 mg Oral Q6H PRN Leandrew Koyanagi, MD   650 mg at 01/17/19 0545  . alum & mag hydroxide-simeth (MAALOX/MYLANTA) 200-200-20 MG/5ML suspension 30 mL  30 mL Oral Q4H PRN Leandrew Koyanagi, MD      . aspirin chewable tablet 81 mg  81 mg Oral BID Leandrew Koyanagi, MD   81 mg at 01/17/19 1119  . diphenhydrAMINE (BENADRYL) 12.5 MG/5ML elixir 25 mg  25 mg Oral Q4H PRN Leandrew Koyanagi, MD      . docusate sodium (COLACE) capsule 100 mg  100 mg Oral BID Leandrew Koyanagi, MD   100 mg at 01/17/19 1119  . ezetimibe (ZETIA) tablet 10 mg  10 mg Oral QPM Leandrew Koyanagi, MD   10 mg at 01/16/19 1704  . gabapentin (NEURONTIN) capsule 300 mg  300 mg Oral TID Leandrew Koyanagi, MD   300 mg at 01/17/19 1117  . HYDROmorphone (DILAUDID) injection 0.5-1 mg  0.5-1 mg Intravenous Q4H PRN Leandrew Koyanagi, MD   1 mg at 01/17/19 0819  . insulin aspart (novoLOG) injection 0-20 Units  0-20 Units Subcutaneous TID WC Leandrew Koyanagi, MD    11 Units at 01/17/19 0745  . insulin aspart (novoLOG) injection 0-5 Units  0-5 Units Subcutaneous QHS Leandrew Koyanagi, MD   5 Units at 01/16/19 2237  . insulin glargine (LANTUS) injection 25 Units  25 Units Subcutaneous Daily Leandrew Koyanagi, MD   25 Units at 01/17/19 1120  . levothyroxine (SYNTHROID) tablet 125 mcg  125 mcg Oral Q0600 Leandrew Koyanagi, MD   125 mcg at 01/17/19 0650  . lisinopril (ZESTRIL) tablet 10 mg  10 mg Oral Daily Leandrew Koyanagi, MD   10 mg at 01/17/19 1119  . magnesium citrate solution 1 Bottle  1 Bottle Oral Once PRN Leandrew Koyanagi, MD      . menthol-cetylpyridinium (CEPACOL) lozenge 3 mg  1 lozenge Oral PRN Leandrew Koyanagi, MD       Or  . phenol (CHLORASEPTIC) mouth spray 1 spray  1 spray Mouth/Throat PRN Leandrew Koyanagi, MD      . methocarbamol (ROBAXIN) tablet 500 mg  500 mg Oral Q6H PRN Leandrew Koyanagi, MD   500 mg at 01/17/19 0545   Or  . methocarbamol (ROBAXIN) 500 mg in dextrose 5 % 50 mL IVPB  500 mg Intravenous Q6H PRN Leandrew Koyanagi, MD      . metoCLOPramide (REGLAN) tablet 5-10 mg  5-10 mg Oral Q8H PRN Leandrew Koyanagi, MD       Or  . metoCLOPramide (REGLAN) injection 5-10 mg  5-10 mg Intravenous Q8H PRN Leandrew Koyanagi, MD      . ondansetron [facility]) tablet 4 mg  4 mg Oral Q6H PRN Leandrew Koyanagi, MD       Or  . ondansetron [facility]) injection 4 mg  4 mg Intravenous Q6H PRN Leandrew Koyanagi, MD   4 mg at 01/15/19 1520  . oxyCODONE (Oxy IR/ROXICODONE) immediate release tablet 10-15 mg  10-15 mg Oral Q4H PRN Leandrew Koyanagi, MD      . oxyCODONE (Oxy IR/ROXICODONE) immediate release tablet 5-10 mg  5-10 mg Oral Q4H PRN Leandrew Koyanagi, MD   5 mg at 01/17/19 1205  . oxyCODONE (OXYCONTIN) 12 hr tablet 10 mg  10 mg Oral Q12H Leandrew Koyanagi, MD   10 mg at 01/17/19 1118  . polyethylene glycol (MIRALAX / GLYCOLAX) packet 17 g  17 g Oral Daily PRN Leandrew Koyanagi, MD      . sertraline (ZOLOFT) tablet 100 mg  100 mg Oral Daily Leandrew Koyanagi, MD   100 mg at 01/17/19 1118  . simvastatin (ZOCOR) tablet  40 mg  40 mg Oral QPM Leandrew Koyanagi, MD   40 mg at 01/16/19 1704  . sorbitol 70 % solution 30 mL  30 mL Oral Daily PRN Leandrew Koyanagi, MD         Discharge Medications: Please see discharge summary for a list of  discharge medications.  Relevant Imaging Results:  Relevant Lab Results:   Additional Information [id number] [place], [place]

## 2019-01-17 NOTE — Progress Notes (Signed)
Subjective: 2 Days Post-Op Procedure(s) (LRB): LEFT TOTAL KNEE ARTHROPLASTY (Left) Patient reports pain as moderate.  Otherwise, no complaints  Objective: Vital signs in last 24 hours: Temp:  [97.2 F (36.2 C)-98.9 F (37.2 C)] 98 F (36.7 C) (07/15 0502) Pulse Rate:  [75-92] 92 (07/15 0502) Resp:  [16-20] 20 (07/15 0502) BP: (101-139)/(40-81) 104/81 (07/15 0502) SpO2:  [95 %-100 %] 100 % (07/15 0502)  Intake/Output from previous day: 07/14 0701 - 07/15 0700 In: 720 [P.O.:720] Out: 800 [Urine:800] Intake/Output this shift: No intake/output data recorded.  Recent Labs    01/16/19 0251  HGB 10.3*   Recent Labs    01/16/19 0251  WBC 6.5  RBC 3.16*  HCT 33.3*  PLT 203   Recent Labs    01/16/19 0251  NA 140  K 4.5  CL 111  CO2 23  BUN 38*  CREATININE 1.65*  GLUCOSE 233*  CALCIUM 8.4*   No results for input(s): LABPT, INR in the last 72 hours.  Neurologically intact Neurovascular intact Sensation intact distally Intact pulses distally Dorsiflexion/Plantar flexion intact Incision: scant drainage No cellulitis present Compartment soft   Assessment/Plan: 2 Days Post-Op Procedure(s) (LRB): LEFT TOTAL KNEE ARTHROPLASTY (Left) Advance diet Up with therapy Discharge to SNF once approved by insurance and bed available WBAT LLE Dressing changed by me today F/u with Dr. Erlinda Hong 2 weeks post-op  Anticipated LOS equal to or greater than 2 midnights due to - Age 18 and older with one or more of the following:  - Obesity  - Expected need for hospital services (PT, OT, Nursing) required for safe  discharge  - Anticipated need for postoperative skilled nursing care or inpatient rehab  - Active co-morbidities: Diabetes OR   - Unanticipated findings during/Post Surgery: Slow post-op progression: GI, pain control, mobility  - Patient is a high risk of re-admission due to: Barriers to post-acute care (logistical, no family support in home)    Toni Hill 01/17/2019, 7:54 AM

## 2019-01-17 NOTE — TOC Initial Note (Signed)
Transition of Care [facility]) - Initial/Assessment Note    Patient Details  Name: Toni Hill MRN: [id number] Date of Birth: 1946-03-28  Transition of Care [facility]) CM/SW Contact:    Alberteen Sam, [place] Phone Number: [phone] 01/17/2019, 1:41 PM  Clinical Narrative:                  CSW consulted with patient regarding discharge plan and PT recommendation of SNF at discharge. Patient agreeable to short term rehab, she reports no facility preference as long as it is in [place] area. Patient reports CSW can contact her family as well for assistance in SNF choice. Patient became tearful in regards to reminiscing about her sister who passed away a few months ago. Patient reports CSW can contact her husband Laverna Peace at [phone]. Patient states her family was upset when hospital staff has been reaching out to her sister Jana Half before her husband or son or daughter. Patient reports this is due to family not liking Jana Half. She requests we contact her husband, son and daughter before reaching out to [employer].   CSW called patient's husband Laverna Peace per patient request. He reports he is not familiar with SNF facilities in [place] however gives permission to fax out referrals in the area. He reports patient had a bad experience at a facility in [facility] but cannot remember the name. He requests a "good" facility for patient. CSW informed Laverna Peace that he will be informed once bed offers are received.   Expected Discharge Plan: Skilled Nursing Facility Barriers to Discharge: Continued Medical Work up   Patient Goals and CMS Choice Patient states their goals for this hospitalization and ongoing recovery are:: to go to rehab then go home to her husband in [facility].gov Compare Post Acute Care list provided to:: Patient Choice offered to / list presented to : Patient  Expected Discharge Plan and Services Expected Discharge Plan: [place] Choice: [place] Living arrangements for the past 2 months: Single Family Home Expected Discharge Date: 01/17/19                                    Prior Living Arrangements/Services Living arrangements for the past 2 months: Single Family Home Lives with:: Spouse Patient language and need for interpreter reviewed:: Yes Do you feel safe going back to the place where you live?: No   needs short term rehab  Need for Family Participation in Patient Care: Yes (Comment) Care giver support system in place?: Yes (comment)   Criminal Activity/Legal Involvement Pertinent to Current Situation/Hospitalization: No - Comment as needed  Activities of Daily Living Home Assistive Devices/Equipment: Eyeglasses, CBG Meter, Other (Comment) ADL Screening (condition at time of admission) Patient's cognitive ability adequate to safely complete daily activities?: Yes Is the patient deaf or have difficulty hearing?: No Does the patient have difficulty seeing, even when wearing glasses/contacts?: Yes Does the patient have difficulty concentrating, remembering, or making decisions?: No Patient able to express need for assistance with ADLs?: Yes Does the patient have difficulty dressing or bathing?: No Independently performs ADLs?: Yes (appropriate for developmental age) Does the patient have difficulty walking or climbing stairs?: Yes Weakness of Legs: Left Weakness of Arms/Hands: None  Permission Sought/Granted Permission sought to share information with : Case Manager, Customer service manager, Family Supports Permission granted to share information with : Yes, Verbal Permission Granted  Share Information with NAME: Laverna Peace  Permission granted to share info w AGENCY: SNFs  Permission granted to share info w Relationship: spouse  Permission granted to share info w Contact Information: [phone]  Emotional Assessment Appearance:: Appears stated age Attitude/Demeanor/Rapport:  Gracious Affect (typically observed): Calm Orientation: : Oriented to Self, Oriented to Place, Oriented to  Time, Oriented to Situation Alcohol / Substance Use: Not Applicable Psych Involvement: No (comment)  Admission diagnosis:  left knee degenerative joint disease Patient Active Problem List   Diagnosis Date Noted  . 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 Osteoarthritis of left knee 12/30/2018  . Anxiety 12/30/2018  . Stage 3 chronic kidney disease ([place]) 06/28/2016  . Hypercholesterolemia 08/20/2015  . Type 2 diabetes mellitus ([place]) 02/28/2014  . Esophageal reflux 02/28/2014  . Stress incontinence, female 02/28/2014  . Acquired hypothyroidism 02/28/2014  . Essential hypertension 11/17/2011   PCP:  Maudie Mercury, MD Pharmacy:   [id number] [place], [place] [address] [place] [address] Phone: [phone] Fax: [phone]     Social Determinants of Health (SDOH) Interventions    Readmission Risk Interventions No flowsheet data found.

## 2019-01-17 NOTE — Progress Notes (Signed)
Physical Therapy Treatment Patient Details Name: Toni Hill MRN: [id number] DOB: 04-Jun-1946 Today's Date: 01/17/2019    History of Present Illness Pt is a 73 y/o female s/p L TKA. PMH includes bipolar disorder, HTN, DM, CKD, and fibromyalgia.     PT Comments    Pt seated on commode on arrival.  Pt reports she is in increased pain.   Pt presents with decreased activity tolerance, max cues provided for encouragement.  Plan for SNF remains appropriate to improve strength and function before returning home.      Follow Up Recommendations  Follow surgeon's recommendation for DC plan and follow-up therapies;Supervision for mobility/OOB     Equipment Recommendations  None recommended by PT    Recommendations for Other Services       Precautions / Restrictions Precautions Precautions: Fall;Knee Restrictions Weight Bearing Restrictions: Yes LLE Weight Bearing: Weight bearing as tolerated    Mobility  Bed Mobility               General bed mobility comments: Pt seated on commode on arrival this pm.  Transfers Overall transfer level: Needs assistance Equipment used: Rolling walker (2 wheeled) Transfers: Sit to/from Stand Sit to Stand: Min assist         General transfer comment: Pt continues to require min assistance for hand placement and to boost into standing.  Slight unsteadiness remains.  Ambulation/Gait Ambulation/Gait assistance: Min assist Gait Distance (Feet): 20 Feet Assistive device: Rolling walker (2 wheeled) Gait Pattern/deviations: Step-to pattern;Step-through pattern;Decreased stride length;Trunk flexed     General Gait Details: Pt moving quickly as to get gt trial over.  She required cues to slow pace and improve safety with gt trial.  Pt lacks distance due to increased pain.   Stairs             Wheelchair Mobility    Modified Rankin (Stroke Patients Only)       Balance Overall balance assessment: Needs assistance   Sitting  balance-Leahy Scale: Good       Standing balance-Leahy Scale: Poor Standing balance comment: Reliant on BUE support , unsteady in standing.                            Cognition Arousal/Alertness: Awake/alert   Overall Cognitive Status: Within Functional Limits for tasks assessed                                        Exercises Total Joint Exercises Ankle Circles/Pumps: AROM;Both;20 reps;Supine Quad Sets: AROM;Left;10 reps;Supine Heel Slides: AAROM;Left;10 reps;Supine Hip ABduction/ADduction: AROM;Left;10 reps;Supine Straight Leg Raises: AROM;Left;10 reps;Supine Goniometric ROM: 6-90 degrees L knee    General Comments        Pertinent Vitals/Pain Pain Assessment: 0-10 Pain Score: 7  Pain Location: L knee  Pain Descriptors / Indicators: Aching;Operative site guarding Pain Intervention(s): Monitored during session;Repositioned    Home Living                      Prior Function            PT Goals (current goals can now be found in the care plan section) Acute Rehab PT Goals Patient Stated Goal: to go to rehab then go home Potential to Achieve Goals: Good Progress towards PT goals: Progressing toward goals    Frequency    7X/week  PT Plan Current plan remains appropriate    Co-evaluation              AM-PAC PT "6 Clicks" Mobility   Outcome Measure  Help needed turning from your back to your side while in a flat bed without using bedrails?: None Help needed moving from lying on your back to sitting on the side of a flat bed without using bedrails?: A Little Help needed moving to and from a bed to a chair (including a wheelchair)?: A Little Help needed standing up from a chair using your arms (e.g., wheelchair or bedside chair)?: A Little Help needed to walk in hospital room?: A Little Help needed climbing 3-5 steps with a railing? : A Little 6 Click Score: 19    End of Session Equipment Utilized During  Treatment: Gait belt Activity Tolerance: Treatment limited secondary to medical complications (Comment) Patient left: with call bell/phone within reach;in bed;in CPM Nurse Communication: Mobility status;Other (comment) PT Visit Diagnosis: Unsteadiness on feet (R26.81);History of falling (Z91.81);Repeated falls (R29.6);Pain;Muscle weakness (generalized) (M62.81) Pain - Right/Left: Left Pain - part of body: Knee     Time: 7011-0034 PT Time Calculation (min) (ACUTE ONLY): 15 min  Charges:  $Gait Training: 8-22 mins                     Governor Rooks, PTA Acute Rehabilitation Services Pager [phone] Office [phone]     Everhett Bozard [name] 01/17/2019, 3:08 PM

## 2019-01-17 NOTE — Evaluation (Signed)
Occupational Therapy Evaluation Patient Details Name: Toni Hill MRN: [id number] DOB: 07-14-1945 Today's Date: 01/17/2019    History of Present Illness Pt is a 73 y/o female s/p L TKA. PMH includes bipolar disorder, HTN, DM, CKD, and fibromyalgia.    Clinical Impression   Pt was independent prior to admission, but reports falls. Presents with anxiety and tearful when talking about her childhood. Pt requires min assist for OOB mobility and set up to moderate assistance for ADL. She reports not having reliable assistance at home and will need post acute, short-term rehab prior to returning home. Will follow acutely.    Follow Up Recommendations  SNF;Supervision/Assistance - 24 hour    Equipment Recommendations  3 in 1 bedside commode    Recommendations for Other Services       Precautions / Restrictions Precautions Precautions: Fall;Knee Restrictions Weight Bearing Restrictions: Yes LLE Weight Bearing: Weight bearing as tolerated      Mobility Bed Mobility Overal bed mobility: Needs Assistance Bed Mobility: Supine to Sit     Supine to sit: Supervision     General bed mobility comments: HOB up, increased time  Transfers Overall transfer level: Needs assistance Equipment used: Rolling walker (2 wheeled) Transfers: Sit to/from Stand Sit to Stand: Min assist         General transfer comment: cues for technique and hand placement, steadying assist, pt with LOB as she was backing up to chair requiring min assist to correct    Balance Overall balance assessment: Needs assistance Sitting-balance support: No upper extremity supported;Feet supported Sitting balance-Leahy Scale: Good       Standing balance-Leahy Scale: Poor Standing balance comment: Reliant on BUE support , unsteady in standing.                           ADL either performed or assessed with clinical judgement   ADL Overall ADL's : Needs assistance/impaired Eating/Feeding:  Independent;Sitting   Grooming: Wash/dry hands;Wash/dry face;Sitting;Set up;Brushing hair   Upper Body Bathing: Set up;Sitting   Lower Body Bathing: Moderate assistance;Sit to/from stand   Upper Body Dressing : Set up;Sitting   Lower Body Dressing: Moderate assistance;Sit to/from stand   Toilet Transfer: Minimal assistance;Ambulation;RW;BSC   Toileting- Clothing Manipulation and Hygiene: Moderate assistance;Sit to/from stand       Functional mobility during ADLs: Minimal assistance;Rolling walker;Cueing for sequencing       Vision Baseline Vision/History: Wears glasses Patient Visual Report: No change from baseline       Perception     Praxis      Pertinent Vitals/Pain Pain Assessment: Faces Faces Pain Scale: Hurts little more Pain Location: L knee  Pain Descriptors / Indicators: Aching;Operative site guarding Pain Intervention(s): Monitored during session;Repositioned     Hand Dominance Left   Extremity/Trunk Assessment Upper Extremity Assessment Upper Extremity Assessment: Overall WFL for tasks assessed   Lower Extremity Assessment Lower Extremity Assessment: Defer to PT evaluation   Cervical / Trunk Assessment Cervical / Trunk Assessment: Normal   Communication Communication Communication: No difficulties   Cognition Arousal/Alertness: Awake/alert Behavior During Therapy: Anxious Overall Cognitive Status: Within Functional Limits for tasks assessed                                     General Comments       Exercises     Shoulder Instructions      Home  Living Family/patient expects to be discharged to:: Skilled nursing facility Living Arrangements: Spouse/significant other                                      Prior Functioning/Environment Level of Independence: Independent        Comments: Pt reports she was independent, however, had multiple falls        OT Problem List: Decreased strength;Impaired  balance (sitting and/or standing);Decreased knowledge of use of DME or AE;Pain      OT Treatment/Interventions: Self-care/ADL training;DME and/or AE instruction;Patient/family education;Balance training;Therapeutic activities    OT Goals(Current goals can be found in the care plan section) Acute Rehab OT Goals Patient Stated Goal: to go to rehab then go home OT Goal Formulation: With patient Time For Goal Achievement: 01/31/19 Potential to Achieve Goals: Good  OT Frequency: Min 2X/week   Barriers to D/C: Decreased caregiver support          Co-evaluation              AM-PAC OT "6 Clicks" Daily Activity     Outcome Measure Help from another person eating meals?: None Help from another person taking care of personal grooming?: A Little Help from another person toileting, which includes using toliet, bedpan, or urinal?: A Lot Help from another person bathing (including washing, rinsing, drying)?: A Lot Help from another person to put on and taking off regular upper body clothing?: None Help from another person to put on and taking off regular lower body clothing?: A Lot 6 Click Score: 17   End of Session CPM Left Knee CPM Left Knee: On Left Knee Flexion (Degrees): 50 Left Knee Extension (Degrees): 0  Activity Tolerance: Patient tolerated treatment well Patient left: in chair;with call bell/phone within reach  OT Visit Diagnosis: Unsteadiness on feet (R26.81);Other abnormalities of gait and mobility (R26.89);Pain                Time: 0850-0920 OT Time Calculation (min): 30 min Charges:  OT General Charges $OT Visit: 1 Visit OT Evaluation $OT Eval Low Complexity: 1 Low OT Treatments $Self Care/Home Management : 8-22 mins  Nestor Lewandowsky, OTR/L Acute Rehabilitation Services Pager: [phone] Office: [phone]  Malka So 01/17/2019, 9:29 AM

## 2019-01-17 NOTE — Discharge Summary (Addendum)
Patient ID: TAJAH NOGUCHI MRN: [id number] DOB/AGE: 11/19/1945 73 y.o.  Admit date: 01/15/2019 Discharge date: 01/18/2019  Admission Diagnoses:  Principal Problem:   Osteoarthritis of left knee Active Problems:   Status post total left knee replacement   Discharge Diagnoses:  Same  Past Medical History:  Diagnosis Date  . Bilateral lower extremity edema    right > left  . Bipolar 1 disorder ([place])   . CKD (chronic kidney disease) stage 3, GFR 30-59 ml/min [facility])    nephrologist-  BMI nephrology in [facility]  . Diabetes, polyneuropathy ([place])    FEET AND FINGER TIPS  . Fibromyalgia   . GERD (gastroesophageal reflux disease)   . History of chronic gastritis   . Hyperlipidemia   . Hypertension    per pt was take off bp medication because of kidney disease told by her nephrologist  . Hypothyroidism   . Mixed incontinence urge and stress   . OA (osteoarthritis)    KNEES  . OAB (overactive bladder)   . PONV (postoperative nausea and vomiting)   . 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 Type 2 diabetes mellitus treated with insulin ([place])   . Wears glasses     Surgeries: Procedure(s): LEFT TOTAL KNEE ARTHROPLASTY on 01/15/2019   Consultants:   Discharged Condition: Improved  Hospital Course: CYBIL SENEGAL is an 73 y.o. female who was admitted 01/15/2019 for operative treatment ofOsteoarthritis of left knee. Patient has severe unremitting pain that affects sleep, daily activities, and work/hobbies. After pre-op clearance the patient was taken to the operating room on 01/15/2019 and underwent  Procedure(s): LEFT TOTAL KNEE ARTHROPLASTY.    Patient was given perioperative antibiotics:  Anti-infectives (From admission, onward)   Start     Dose/Rate Route Frequency Ordered Stop   01/15/19 2200  vancomycin (VANCOCIN) IVPB 1000 mg/200 mL premix     1,000 mg 200 mL/hr over 60 Minutes Intravenous  Once 01/15/19 1456 01/15/19 2236   01/15/19 1445  ceFAZolin (ANCEF) IVPB 2g/100  mL premix  Status:  Discontinued     2 g 200 mL/hr over 30 Minutes Intravenous Every 6 hours 01/15/19 1440 01/15/19 1448   01/15/19 1308  vancomycin (VANCOCIN) powder  Status:  Discontinued       As needed 01/15/19 1309 01/15/19 1353   01/15/19 0600  vancomycin (VANCOCIN) 1,500 mg in sodium chloride 0.9 % 500 mL IVPB     1,500 mg 250 mL/hr over 120 Minutes Intravenous On call to O.R. 01/12/19 0902 01/15/19 1126   01/15/19 0000  sulfamethoxazole-trimethoprim (BACTRIM DS) 800-160 MG tablet     1 tablet Oral 2 times daily 01/15/19 1328         Patient was given sequential compression devices, early ambulation, and chemoprophylaxis to prevent DVT.  Patient benefited maximally from hospital stay and there were no complications.    Recent vital signs:  Patient Vitals for the past 24 hrs:  BP Temp Temp src Pulse Resp SpO2  01/17/19 2000 (!) 137/47 98.6 F (37 C) Oral 90 16 98 %  01/17/19 1556 (!) 148/62 98.4 F (36.9 C) Oral 81 18 97 %  01/17/19 0801 (!) 120/44 99.3 F (37.4 C) Oral 72 16 98 %     Recent laboratory studies:  Recent Labs    01/16/19 0251  WBC 6.5  HGB 10.3*  HCT 33.3*  PLT 203  NA 140  K 4.5  CL 111  CO2 23  BUN 38*  CREATININE 1.65*  GLUCOSE 233*  CALCIUM 8.4*     Discharge Medications:   Allergies as of 01/18/2019      Reactions   Morphine And Related Nausea And Vomiting   Vioxx [rofecoxib]    GI bleeding   Penicillins Hives, Itching   Has patient had a PCN reaction causing immediate rash, facial/tongue/throat swelling, SOB or lightheadedness with hypotension: No Has patient had a PCN reaction causing severe rash involving mucus membranes or skin necrosis: No Has patient had a PCN reaction that required hospitalization: no Has patient had a PCN reaction occurring within the last 10 years: No If all of the above answers are "NO", then may proceed with Cephalosporin use.      Medication List    STOP taking these medications   acetaminophen 500  MG tablet Commonly known as: TYLENOL   aspirin 81 MG chewable tablet Replaced by: aspirin EC 81 MG tablet   HYDROcodone-acetaminophen 5-325 MG tablet Commonly known as: Norco     TAKE these medications   aspirin EC 81 MG tablet Take 1 tablet (81 mg total) by mouth 2 (two) times daily. Replaces: aspirin 81 MG chewable tablet   Basaglar KwikPen 100 UNIT/ML Sopn Inject 25 Units into the skin every morning.   Calcium 600/Vitamin D3 600-800 MG-UNIT Tabs Generic drug: Calcium Carb-Cholecalciferol Take 1 tablet by mouth daily.   EQL Vitamin D3 50 MCG (2000 UT) Caps Generic drug: Cholecalciferol Take 2,000 Units by mouth daily.   ezetimibe 10 MG tablet Commonly known as: ZETIA Take 10 mg by mouth every evening.   gabapentin 100 MG capsule Commonly known as: NEURONTIN Take 1 capsule (100 mg total) by mouth 2 (two) times daily.   hydrocortisone 2.5 % cream Apply 1 application topically 2 (two) times daily as needed (dry skin).   insulin aspart 100 UNIT/ML injection Commonly known as: novoLOG Inject 6-8 Units into the skin 3 (three) times daily with meals.   levothyroxine 137 MCG tablet Commonly known as: SYNTHROID Take 137 mcg by mouth daily before breakfast.   lisinopril 10 MG tablet Commonly known as: ZESTRIL Take 10 mg by mouth daily.   methocarbamol 750 MG tablet Commonly known as: ROBAXIN Take 1 tablet (750 mg total) by mouth 2 (two) times daily as needed for muscle spasms.   ondansetron 4 MG tablet Commonly known as: ZOFRAN Take 1-2 tablets (4-8 mg total) by mouth every 8 (eight) hours as needed for nausea or vomiting.   oxyCODONE 10 mg 12 hr tablet Commonly known as: OXYCONTIN Take 1 tablet (10 mg total) by mouth every 12 (twelve) hours for 3 days.   oxyCODONE-acetaminophen 5-325 MG tablet Commonly known as: Percocet Take 1-2 tablets by mouth every 8 (eight) hours as needed for severe pain.   promethazine 25 MG tablet Commonly known as: PHENERGAN Take 1  tablet (25 mg total) by mouth every 6 (six) hours as needed for nausea.   senna-docusate 8.6-50 MG tablet Commonly known as: Senokot S Take 1-2 tablets by mouth at bedtime as needed.   sertraline 100 MG tablet Commonly known as: ZOLOFT Take 100 mg by mouth daily.   simvastatin 40 MG tablet Commonly known as: ZOCOR Take 40 mg by mouth every evening.   sulfamethoxazole-trimethoprim 800-160 MG tablet Commonly known as: BACTRIM DS Take 1 tablet by mouth 2 (two) times daily.            Durable Medical Equipment  (From admission, onward)         Start  Ordered   01/15/19 1441  DME Walker rolling  Once    Question:  Patient needs a walker to treat with the following condition  Answer:  Total knee replacement status   01/15/19 1440   01/15/19 1441  DME 3 n 1  Once     01/15/19 1440   01/15/19 1441  DME Bedside commode  Once    Question:  Patient needs a bedside commode to treat with the following condition  Answer:  Total knee replacement status   01/15/19 1440          Diagnostic Studies: Dg Chest 2 View  Result Date: 01/10/2019 CLINICAL DATA:  Osteoarthritis.  Pain. EXAM: CHEST - 2 VIEW COMPARISON:  Chest x-ray dated January 09, 2018. FINDINGS: The heart size is stable. Aortic calcifications are noted. There is no pneumothorax or large pleural effusion. There is a chronic appearing deformity of the proximal left humerus. Degenerative changes are noted of both glenohumeral joints. IMPRESSION: No active cardiopulmonary disease. Electronically Signed   By: Constance Holster M.D.   On: 01/10/2019 20:00   Dg Knee Left Port  Result Date: 01/15/2019 CLINICAL DATA:  LEFT total knee arthroplasty EXAM: PORTABLE LEFT KNEE - 1-2 VIEW COMPARISON:  12/15/2018 MR and prior studies FINDINGS: LEFT total knee arthroplasty identified. No acute fracture or dislocation. No complicating features are noted. IMPRESSION: LEFT total knee arthroplasty without complicating features. Electronically  Signed   By: Margarette Canada M.D.   On: 01/15/2019 14:24   Xr Knee 3 View Left  Result Date: 12/20/2018 End stage tricompartmental DJD   Disposition: Discharge disposition: 03-Skilled [facility]    Leandrew Koyanagi, MD In 2 weeks.   Specialty: Orthopedic Surgery Why: For suture removal, For wound re-check Contact information: [place] [address] [phone]            Signed: GERA INBODEN 01/18/2019, 7:36 AM

## 2019-01-18 LAB — GLUCOSE, CAPILLARY
Glucose-Capillary: 129 mg/dL — ABNORMAL HIGH (ref 70–99)
Glucose-Capillary: 189 mg/dL — ABNORMAL HIGH (ref 70–99)
Glucose-Capillary: 140 mg/dL — ABNORMAL HIGH (ref 70–99)
Glucose-Capillary: 234 mg/dL — ABNORMAL HIGH (ref 70–99)

## 2019-01-18 LAB — SARS CORONAVIRUS 2 BY RT PCR (HOSPITAL ORDER, PERFORMED IN ~~LOC~~ HOSPITAL LAB): SARS Coronavirus 2: NEGATIVE

## 2019-01-18 NOTE — Progress Notes (Signed)
CSW provided patient with bed offers in which she chooses [facility]. They have a bed avail today however requesting updated COVID test, CSW informed nurse of need for rapid COVID test in order for patient to discharge to [facility].   [place], [place]

## 2019-01-18 NOTE — Progress Notes (Signed)
Subjective: 3 Days Post-Op Procedure(s) (LRB): LEFT TOTAL KNEE ARTHROPLASTY (Left) Patient reports pain as mild.  Feeling ok this am.  Objective: Vital signs in last 24 hours: Temp:  [98.4 F (36.9 C)-99.3 F (37.4 C)] 98.6 F (37 C) (07/15 2000) Pulse Rate:  [72-90] 90 (07/15 2000) Resp:  [16-18] 16 (07/15 2000) BP: (120-148)/(44-62) 137/47 (07/15 2000) SpO2:  [97 %-98 %] 98 % (07/15 2000)  Intake/Output from previous day: 07/15 0701 - 07/16 0700 In: 780 [P.O.:780] Out: 1 [Stool:1] Intake/Output this shift: No intake/output data recorded.  Recent Labs    01/16/19 0251  HGB 10.3*   Recent Labs    01/16/19 0251  WBC 6.5  RBC 3.16*  HCT 33.3*  PLT 203   Recent Labs    01/16/19 0251  NA 140  K 4.5  CL 111  CO2 23  BUN 38*  CREATININE 1.65*  GLUCOSE 233*  CALCIUM 8.4*   No results for input(s): LABPT, INR in the last 72 hours.  Neurologically intact Neurovascular intact Sensation intact distally Intact pulses distally Dorsiflexion/Plantar flexion intact Incision: dressing C/D/I No cellulitis present Compartment soft   Assessment/Plan: 3 Days Post-Op Procedure(s) (LRB): LEFT TOTAL KNEE ARTHROPLASTY (Left) Advance diet Up with therapy Discharge to SNF likely today pending bed availability WBAT LLE    Anticipated LOS equal to or greater than 2 midnights due to - Age 72 and older with one or more of the following:  - Obesity  - Expected need for hospital services (PT, OT, Nursing) required for safe  discharge  - Anticipated need for postoperative skilled nursing care or inpatient rehab  - Active co-morbidities: Diabetes OR   - Unanticipated findings during/Post Surgery: Slow post-op progression: GI, pain control, mobility  - Patient is a high risk of re-admission due to: Barriers to post-acute care (logistical, no family support in home)    Toni Hill 01/18/2019, 7:35 AM

## 2019-01-18 NOTE — Progress Notes (Signed)
Awaiting Covid test.

## 2019-01-18 NOTE — Care Management Important Message (Signed)
Important Message  Patient Details  Name: Toni Hill MRN: [id number] Date of Birth: 16-May-1946   Medicare Important Message Given:  Yes     Memory Argue 01/18/2019, 3:40 PM

## 2019-01-18 NOTE — TOC Transition Note (Signed)
Transition of Care [facility]) - CM/SW Discharge Note   Patient Details  Name: Toni Hill MRN: [id number] Date of Birth: 03-May-1946  Transition of Care [facility]) CM/SW Contact:  Alberteen Sam, LCSW Phone Number: 01/18/2019, 4:38 PM   Clinical Narrative:     Patient will DC to: [facility] Anticipated DC date: 01/18/2019 Family notified: Jana Half Transport by: Corey Harold  Per MD patient ready for DC to [facility] . RN, patient, patient's family, and facility notified of DC. Discharge Summary sent to facility. RN given number for report [phone] Room 310  . DC packet on chart. Ambulance transport requested for patient.  CSW signing off.  [place], [place]   Final next level of care: Skilled Nursing Facility Barriers to Discharge: No Barriers Identified   Patient Goals and CMS Choice Patient states their goals for this hospitalization and ongoing recovery are:: to go to rehab then go home to her husband in [facility].gov Compare Post Acute Care list provided to:: Patient Choice offered to / list presented to : Patient  Discharge Placement PASRR number recieved: 01/17/19            Patient chooses bed at: [facility] Patient to be transferred to facility by: [facility] Name of family member notified: Jana Half Patient and family notified of of transfer: 01/18/19  Discharge Plan and Services     Post Acute Care Choice: [place]                               Social Determinants of Health (SDOH) Interventions     Readmission Risk Interventions No flowsheet data found.

## 2019-01-18 NOTE — Progress Notes (Signed)
Report was given to Christa at [facility] and Rehab.  A discharge packet will be given to [facility] for the nursing staff.  Covid 19 test is negative for the 2nd time.  The patient is ready for discharge.

## 2019-01-18 NOTE — Telephone Encounter (Signed)
Currently inpt.

## 2019-01-18 NOTE — Progress Notes (Signed)
Physical Therapy Treatment Patient Details Name: Toni Hill MRN: [id number] DOB: Sep 30, 1945 Today's Date: 01/18/2019    History of Present Illness Pt is a 73 y/o female s/p L TKA. PMH includes bipolar disorder, HTN, DM, CKD, and fibromyalgia.     PT Comments    Pt supine in bed on arrival.  Performed LE HEP with AAROM to perform L knee flexion.  Pt continues to require min assistance as she remains mildly unsteady on her feet.  Pt will benefit from follow up SNF placement before returning home.     Follow Up Recommendations  Follow surgeon's recommendation for DC plan and follow-up therapies;Supervision for mobility/OOB     Equipment Recommendations  None recommended by PT    Recommendations for Other Services       Precautions / Restrictions Precautions Precautions: Fall;Knee Precaution Booklet Issued: No Restrictions Weight Bearing Restrictions: No LLE Weight Bearing: Weight bearing as tolerated    Mobility  Bed Mobility Overal bed mobility: Needs Assistance Bed Mobility: Supine to Sit     Supine to sit: Supervision Sit to supine: Min assist   General bed mobility comments: min assistance to lift LEs back into bed due to pain.  Transfers Overall transfer level: Needs assistance Equipment used: Rolling walker (2 wheeled) Transfers: Sit to/from Stand Sit to Stand: Min assist Stand pivot transfers: Min assist       General transfer comment: Cues for hand placement to and from seated surface.  Pt slow and gaurded.  Cues to keep RW close when backing to seated surface.  Ambulation/Gait Ambulation/Gait assistance: Min assist Gait Distance (Feet): 60 Feet Assistive device: Rolling walker (2 wheeled) Gait Pattern/deviations: Step-to pattern;Step-through pattern;Decreased stride length;Trunk flexed;Decreased weight shift to left     General Gait Details: Pt continues to present with uneven strides and poor foot clearance on R side.  Plan next session for  continued focus on gt symmetry.   Stairs             Wheelchair Mobility    Modified Rankin (Stroke Patients Only)       Balance Overall balance assessment: Needs assistance Sitting-balance support: No upper extremity supported;Feet supported Sitting balance-Leahy Scale: Good     Standing balance support: Bilateral upper extremity supported;During functional activity Standing balance-Leahy Scale: Poor                              Cognition Arousal/Alertness: Awake/alert Behavior During Therapy: Anxious Overall Cognitive Status: Within Functional Limits for tasks assessed                                        Exercises Total Joint Exercises Ankle Circles/Pumps: AROM;Both;20 reps;Supine Quad Sets: AROM;Left;10 reps;Supine Heel Slides: AAROM;Left;10 reps;Supine Hip ABduction/ADduction: AROM;Left;10 reps;Supine Straight Leg Raises: AROM;Left;10 reps;Supine Goniometric ROM: 10-78 degrees L knee, more stiff this pm    General Comments        Pertinent Vitals/Pain Pain Assessment: 0-10 Pain Score: 7  Faces Pain Scale: Hurts little more Pain Location: L knee  Pain Descriptors / Indicators: Aching;Operative site guarding Pain Intervention(s): Monitored during session;Repositioned    Home Living                      Prior Function            PT Goals (current goals can  now be found in the care plan section) Acute Rehab PT Goals Patient Stated Goal: to go to rehab then go home Potential to Achieve Goals: Good Progress towards PT goals: Progressing toward goals    Frequency           PT Plan Current plan remains appropriate    Co-evaluation              AM-PAC PT "6 Clicks" Mobility   Outcome Measure  Help needed turning from your back to your side while in a flat bed without using bedrails?: None Help needed moving from lying on your back to sitting on the side of a flat bed without using bedrails?: A  Little Help needed moving to and from a bed to a chair (including a wheelchair)?: A Little Help needed standing up from a chair using your arms (e.g., wheelchair or bedside chair)?: A Little Help needed to walk in hospital room?: A Little Help needed climbing 3-5 steps with a railing? : A Little 6 Click Score: 19    End of Session Equipment Utilized During Treatment: Gait belt Activity Tolerance: Treatment limited secondary to medical complications (Comment) Patient left: with call bell/phone within reach;in bed;in CPM Nurse Communication: Mobility status;Other (comment) Pain - Right/Left: Left Pain - part of body: Knee     Time: 6728-9791 PT Time Calculation (min) (ACUTE ONLY): 24 min  Charges:  $Gait Training: 8-22 mins $Therapeutic Exercise: 8-22 mins                     Governor Rooks, PTA Acute Rehabilitation Services Pager [phone] Office [phone]     Toni Hill 01/18/2019, 4:42 PM

## 2019-01-19 ENCOUNTER — Telehealth: Payer: Self-pay | Admitting: Orthopaedic Surgery

## 2019-01-19 ENCOUNTER — Non-Acute Institutional Stay (SKILLED_NURSING_FACILITY): Payer: Medicare Other | Admitting: Adult Health

## 2019-01-19 ENCOUNTER — Telehealth: Payer: Self-pay

## 2019-01-19 ENCOUNTER — Encounter: Payer: Self-pay | Admitting: Adult Health

## 2019-01-19 DIAGNOSIS — E1140 Type 2 diabetes mellitus with diabetic neuropathy, unspecified: Secondary | ICD-10-CM

## 2019-01-19 DIAGNOSIS — I10 Essential (primary) hypertension: Secondary | ICD-10-CM | POA: Diagnosis not present

## 2019-01-19 DIAGNOSIS — F319 Bipolar disorder, unspecified: Secondary | ICD-10-CM

## 2019-01-19 DIAGNOSIS — E039 Hypothyroidism, unspecified: Secondary | ICD-10-CM | POA: Diagnosis not present

## 2019-01-19 DIAGNOSIS — E7800 Pure hypercholesterolemia, unspecified: Secondary | ICD-10-CM

## 2019-01-19 DIAGNOSIS — Z794 Long term (current) use of insulin: Secondary | ICD-10-CM

## 2019-01-19 DIAGNOSIS — M1712 Unilateral primary osteoarthritis, left knee: Secondary | ICD-10-CM | POA: Diagnosis not present

## 2019-01-19 NOTE — Telephone Encounter (Signed)
Patient called advised she is at [facility] and the nurse took her hose off and she is concerned about getting a blood clot. Patient said she do not have a bedside commode. Patient said she do not want to wear a diaper. Patient said she is suppose to take Synthroid 123 mg instead they are giving her a generic medication 137 mg and it is to much. Patient asked if she can be moved to another facility for her care? Patient asked if Dr Alanson Aly can also be called.  The number to contact patient is [phone]

## 2019-01-19 NOTE — Telephone Encounter (Signed)
Spoke with the patient and let her know I would call [facility] I spoke with Mikki Harbor at [facility] and she states they just got the order from PT to give her bedside commode also therapy will put her compression hose on when they come.

## 2019-01-19 NOTE — Telephone Encounter (Signed)
So I talked to Mikki Harbor [phone]) at [facility] and she is asking that we send a D/C order for patients 135 synthroid and send 125 Synthroid to their pharmacy (patient states she uses brand name Synthroid only)  [facility]  Also, she is asking why patient is on Bactrim?

## 2019-01-19 NOTE — Progress Notes (Signed)
Location:  [place] Room Number: 310/B Place of Service:  SNF (31) Provider:  Durenda Age, DNP, FNP-BC  Patient Care Team: Maudie Mercury, MD as PCP - General  Extended Emergency Contact Information Primary Emergency Contact: Lacie Draft Home Phone: [phone] Relation: Sister Secondary Emergency Contact: [place], [place] Phone: [phone] Relation: Daughter   Code Status:  Full Code  Goals of care: Advanced Directive information Advanced Directives 01/19/2019  Does Patient Have a Medical Advance Directive? Yes  Type of Advance Directive (No Data)  Does patient want to make changes to medical advance directive? No - Patient declined  Would patient like information on creating a medical advance directive? No - Patient declined     Chief Complaint  Patient presents with  . Acute Visit    Hospitalization Follow Up Visit     HPI:  Pt is a 73 y.o. female seen today for an acute visit for a hospitalization follow-up. She was admitted to [facility] on 01/18/19 from a recent hospitalization due to Osteoarthritis of left knee for which she had a left total knee arthroplasty on 01/15/19. She was admitted at [facility] for a short-term rehabilitation. She has PMH of CKD stage 3, hypercholesterolemia, type 2 diabetes mellitus, HTN and esophageal reflux.   Past Medical History:  Diagnosis Date  . Bilateral lower extremity edema    right > left  . Bipolar 1 disorder ([place])   . CKD (chronic kidney disease) stage 3, GFR 30-59 ml/min [facility])    nephrologist-  BMI nephrology in [facility]  . Diabetes, polyneuropathy ([place])    FEET AND FINGER TIPS  . Fibromyalgia   . GERD (gastroesophageal reflux disease)   . History of chronic gastritis   . Hyperlipidemia   . Hypertension    per pt was take off bp medication because of kidney disease told by her nephrologist  . Hypothyroidism   . Mixed incontinence urge and stress     . OA (osteoarthritis)    KNEES  . OAB (overactive bladder)   . PONV (postoperative nausea and vomiting)   . 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 Type 2 diabetes mellitus treated with insulin ([place])   . Wears glasses    Past Surgical History:  Procedure Laterality Date  . CHOLECYSTECTOMY OPEN  1990's  . COLONOSCOPY  last one 07-17-2014  . CYSTOSCOPY WITH INJECTION N/A 10/29/2016   Procedure: CYSTOSCOPY WITH INJECTION BOTOX 100 UNITS, urethral dilation;  Surgeon: Carolan Clines, MD;  Location: [place];  Service: Urology;  Laterality: N/A;  . ESOPHAGOGASTRODUODENOSCOPY  last one 07-01-2016  . EXCISIONAL BREAST BX  1990   BENIGN  . ORIF TIBIA & FIBULA FRACTURES  2010   retained rod  . TONSILLECTOMY  age 77  . TOTAL KNEE ARTHROPLASTY Left 01/15/2019   Procedure: LEFT TOTAL KNEE ARTHROPLASTY;  Surgeon: Leandrew Koyanagi, MD;  Location: [place];  Service: Orthopedics;  Laterality: Left;  Marland Kitchen VAGINAL HYSTERECTOMY  1979  . VENTRAL HERNIA REPAIR  1990's    Allergies  Allergen Reactions  . Morphine And Related Nausea And Vomiting  . Vioxx [Rofecoxib]     GI bleeding  . Penicillins Hives and Itching    Has patient had a PCN reaction causing immediate rash, facial/tongue/throat swelling, SOB or lightheadedness with hypotension: No Has patient had a PCN reaction causing severe rash involving mucus membranes or skin necrosis: No Has patient had a PCN reaction that required hospitalization: no  Has patient had a PCN reaction occurring within the last 10 years: No If all of the above answers are "NO", then may proceed with Cephalosporin use.     Outpatient Encounter Medications as of 01/19/2019  Medication Sig  . aspirin EC 81 MG tablet Take 1 tablet (81 mg total) by mouth 2 (two) times daily.  . Calcium Carb-Cholecalciferol (CALCIUM 600/VITAMIN D3) 600-800 MG-UNIT TABS Take 1 tablet by mouth daily.   . Cholecalciferol (EQL VITAMIN D3) 50 MCG (2000 UT) CAPS Take 2,000  Units by mouth daily.   Marland Kitchen ezetimibe (ZETIA) 10 MG tablet Take 10 mg by mouth every evening.   . gabapentin (NEURONTIN) 100 MG capsule Take 1 capsule (100 mg total) by mouth 2 (two) times daily.  . hydrocortisone 2.5 % cream Apply 1 application topically 2 (two) times daily as needed (dry skin).  . insulin aspart (NOVOLOG) 100 UNIT/ML injection Inject 6 Units into the skin 3 (three) times daily with meals.   . Insulin Glargine (BASAGLAR KWIKPEN) 100 UNIT/ML SOPN Inject 25 Units into the skin every morning.  Marland Kitchen levothyroxine (SYNTHROID, LEVOTHROID) 137 MCG tablet Take 137 mcg by mouth daily before breakfast.  . lisinopril (ZESTRIL) 10 MG tablet Take 10 mg by mouth daily.  . methocarbamol (ROBAXIN) 750 MG tablet Take 1 tablet (750 mg total) by mouth 2 (two) times daily as needed for muscle spasms.  . ondansetron (ZOFRAN) 4 MG tablet Take 4 mg by mouth every 8 (eight) hours as needed for nausea or vomiting.  Marland Kitchen oxyCODONE (OXYCONTIN) 10 mg 12 hr tablet Take 10 mg by mouth every 12 (twelve) hours. Take for 3 days  . oxyCODONE-acetaminophen (PERCOCET/ROXICET) 5-325 MG tablet Take 1 tablet by mouth every 8 (eight) hours as needed for severe pain.  Marland Kitchen senna-docusate (SENOKOT-S) 8.6-50 MG tablet Take 2 tablets by mouth at bedtime.  . sertraline (ZOLOFT) 100 MG tablet Take 100 mg by mouth daily.   . simvastatin (ZOCOR) 40 MG tablet Take 40 mg by mouth every evening.   . sulfamethoxazole-trimethoprim (BACTRIM DS) 800-160 MG tablet Take 1 tablet by mouth 2 (two) times daily.  Derrill Memo ON 01/31/2019] tuberculin (TUBERSOL) 5 UNIT/0.1ML injection INJECT 0.26ml Intradermally into the forearm. S/R TB Injection Site  . [DISCONTINUED] ondansetron (ZOFRAN) 4 MG tablet Take 1-2 tablets (4-8 mg total) by mouth every 8 (eight) hours as needed for nausea or vomiting. (Patient taking differently: Take 4 mg by mouth every 8 (eight) hours as needed for nausea or vomiting. )  . [DISCONTINUED] oxyCODONE-acetaminophen (PERCOCET)  5-325 MG tablet Take 1-2 tablets by mouth every 8 (eight) hours as needed for severe pain. (Patient taking differently: Take 1 tablet by mouth every 8 (eight) hours as needed for severe pain. )  . [DISCONTINUED] promethazine (PHENERGAN) 25 MG tablet Take 1 tablet (25 mg total) by mouth every 6 (six) hours as needed for nausea.  . [DISCONTINUED] senna-docusate (SENOKOT S) 8.6-50 MG tablet Take 1-2 tablets by mouth at bedtime as needed. (Patient taking differently: Take 2 tablets by mouth at bedtime as needed. )   No facility-administered encounter medications on file as of 01/19/2019.     Review of Systems  GENERAL: No change in appetite, no fatigue, no weight changes, no fever, chills or weakness MOUTH and THROAT: Denies oral discomfort, gingival pain or bleeding, pain from teeth or hoarseness   RESPIRATORY: no cough, SOB, DOE, wheezing, hemoptysis CARDIAC: No chest pain, edema or palpitations GI: No abdominal pain, diarrhea, constipation, heart burn, nausea or vomiting  GU: Denies dysuria, frequency, hematuria, incontinence, or discharge NEUROLOGICAL: Denies dizziness, syncope, numbness, or headache PSYCHIATRIC: Denies feelings of depression or anxiety. No report of hallucinations, insomnia, paranoia, or agitation    There is no immunization history on file for this patient. Pertinent  Health Maintenance Due  Topic Date Due  . MAMMOGRAM  08/15/2000  . FOOT EXAM  02/19/2019 (Originally 12/28/1955)  . OPHTHALMOLOGY EXAM  02/19/2019 (Originally 12/28/1955)  . DEXA SCAN  02/19/2019 (Originally 12/29/2011)  . COLONOSCOPY  02/19/2019 (Originally 12/28/1996)  . INFLUENZA VACCINE  02/03/2019  . HEMOGLOBIN A1C  06/21/2019  . PNA vac Low Risk Adult  Completed   Fall Risk  12/28/2018  Number falls in past yr: 1  Injury with Fall? 1     Vitals:   01/19/19 0827  BP: (!) 146/69  Pulse: 76  Resp: 20  Temp: 99.1 F (37.3 C)  TempSrc: Oral  SpO2: 98%  Weight: 229 lb (103.9 kg)  Height: 5\' 9"   (1.753 m)   Body mass index is 33.82 kg/m.  Physical Exam  GENERAL APPEARANCE: Well nourished. In no acute distress. Obese SKIN:  Left knee surgical site with Aquacel dressing, no erythema on the sides, dry MOUTH and THROAT: Lips are without lesions. Oral mucosa is moist and without lesions. Tongue is normal in shape, size, and color and without lesions RESPIRATORY: Breathing is even & unlabored, BS CTAB CARDIAC: RRR, no murmur,no extra heart sounds, no edema GI: Abdomen soft, normal BS, no masses, no tenderness EXTREMITIES:  Able to move X 4 extremities NEUROLOGICAL: There is no tremor. Speech is clear.  Alert and oriented X 3. PSYCHIATRIC: Affect and behavior are appropriate   Labs reviewed: Recent Labs    12/28/18 1605 01/10/19 1530 01/16/19 0251  NA 143 141 140  K 4.7 4.9 4.5  CL 105 107 111  CO2 23 26 23   GLUCOSE 124* 351* 233*  BUN 32* 28* 38*  CREATININE 1.45* 1.61* 1.65*  CALCIUM 9.4 9.5 8.4*   Recent Labs    01/10/19 1530  AST 15  ALT 12  ALKPHOS 56  BILITOT 0.6  PROT 6.1*  ALBUMIN 3.7   Recent Labs    01/10/19 1530 01/16/19 0251  WBC 5.6 6.5  NEUTROABS 3.3  --   HGB 13.2 10.3*  HCT 41.8 33.3*  MCV 103.7* 105.4*  PLT 301 203   Lab Results  Component Value Date   TSH 0.143 (L) 12/28/2018   Lab Results  Component Value Date   HGBA1C 7.3 (H) 12/20/2018   No results found for: CHOL, HDL, LDLCALC, LDLDIRECT, TRIG, CHOLHDL  Significant Diagnostic Results in last 30 days:  Dg Chest 2 View  Result Date: 01/10/2019 CLINICAL DATA:  Osteoarthritis.  Pain. EXAM: CHEST - 2 VIEW COMPARISON:  Chest x-ray dated January 09, 2018. FINDINGS: The heart size is stable. Aortic calcifications are noted. There is no pneumothorax or large pleural effusion. There is a chronic appearing deformity of the proximal left humerus. Degenerative changes are noted of both glenohumeral joints. IMPRESSION: No active cardiopulmonary disease. Electronically Signed   By: Constance Holster M.D.   On: 01/10/2019 20:00   Dg Knee Left Port  Result Date: 01/15/2019 CLINICAL DATA:  LEFT total knee arthroplasty EXAM: PORTABLE LEFT KNEE - 1-2 VIEW COMPARISON:  12/15/2018 MR and prior studies FINDINGS: LEFT total knee arthroplasty identified. No acute fracture or dislocation. No complicating features are noted. IMPRESSION: LEFT total knee arthroplasty without complicating features. Electronically Signed   By: Dellis Filbert  Hu M.D.   On: 01/15/2019 14:24    Assessment/Plan  1. Primary osteoarthritis of left knee - S/P Left total knee arthroplasty on 01/15/19, continue ASA 81 mg BID and bilateral knee high ted stockings for DVT prophylaxis, Bactrim DS 1 tab BID X 10 days for prophylaxis, Robaxin 750 mg Q 12 hours PRN for muscle spasm, Oxycontin ER 10 mg Q 12 hours X 3 days and Percocet 5-325 mg 1 tab Q 8 hours PRN for pain, follow-up with orthopedic surgery in 2 weeks  2. Acquired hypothyroidism Lab Results  Component Value Date   TSH 0.143 (L) 12/28/2018  - she said that she takes brand name Synthroid 125 mcg instead of 137 mcg daily due to low tsh level  3. Type 2 diabetes mellitus with diabetic neuropathy, with long-term current use of insulin (HCC) Lab Results  Component Value Date   HGBA1C 7.3 (H) 12/20/2018  - continue Basaglar 100 units/ml inject 25 units SQ Q AM, Novolog 100 units/ml inject 6 units TID with meals and Gabapentin 100 mg 1 capsule twice a day  4. Essential hypertension - continue Lisinopril 10 mg daily  5. Hypercholesterolemia - continue Ezetimibe 10 mg Q evening and Simvastatin 40 mg Q evening  6. Bipolar 1 disorder ([place]) - continue Sertraline 100 mg 1 tab daily    Family/ staff Communication:  Discussed plan of care with resident.  Labs/tests ordered:  None  Goals of care:  Short-term care   Durenda Age, DNP, FNP-BC [facility] [phone] (Monday-Friday 8:00 a.m. - 5:00 p.m.) [phone] (after  hours)

## 2019-01-19 NOTE — Telephone Encounter (Signed)
There is nothing I can do about this as she is under the care of the doctor there.  I would advise that she wears compression socks to BLE during the day

## 2019-01-19 NOTE — Telephone Encounter (Signed)
See below

## 2019-01-20 DIAGNOSIS — F319 Bipolar disorder, unspecified: Secondary | ICD-10-CM | POA: Insufficient documentation

## 2019-01-22 ENCOUNTER — Telehealth: Payer: Self-pay

## 2019-01-22 ENCOUNTER — Non-Acute Institutional Stay (SKILLED_NURSING_FACILITY): Payer: Medicare Other | Admitting: Internal Medicine

## 2019-01-22 ENCOUNTER — Encounter: Payer: Self-pay | Admitting: Internal Medicine

## 2019-01-22 DIAGNOSIS — M1712 Unilateral primary osteoarthritis, left knee: Secondary | ICD-10-CM

## 2019-01-22 DIAGNOSIS — Z9189 Other specified personal risk factors, not elsewhere classified: Secondary | ICD-10-CM | POA: Insufficient documentation

## 2019-01-22 DIAGNOSIS — E039 Hypothyroidism, unspecified: Secondary | ICD-10-CM

## 2019-01-22 DIAGNOSIS — R509 Fever, unspecified: Secondary | ICD-10-CM

## 2019-01-22 DIAGNOSIS — Z794 Long term (current) use of insulin: Secondary | ICD-10-CM

## 2019-01-22 DIAGNOSIS — E1140 Type 2 diabetes mellitus with diabetic neuropathy, unspecified: Secondary | ICD-10-CM | POA: Diagnosis not present

## 2019-01-22 HISTORY — DX: Other specified personal risk factors, not elsewhere classified: Z91.89

## 2019-01-22 NOTE — Assessment & Plan Note (Signed)
Risk discussed with the patient

## 2019-01-22 NOTE — Telephone Encounter (Signed)
Please advise on message below. Thank you!
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
## 2019-01-22 NOTE — Assessment & Plan Note (Signed)
PT/OT at SNF °

## 2019-01-22 NOTE — Telephone Encounter (Signed)
Please forward to lindsey.  Thanks.

## 2019-01-22 NOTE — Patient Instructions (Signed)
See assessment and plan under each diagnosis in the problem list and acutely for this visit Total time 58 minutes; greater than 50% of the visit spent counseling patient and coordinating care for problems addressed at this encounter
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
## 2019-01-23 ENCOUNTER — Non-Acute Institutional Stay (SKILLED_NURSING_FACILITY): Payer: Medicare Other | Admitting: Internal Medicine

## 2019-01-23 ENCOUNTER — Encounter: Payer: Self-pay | Admitting: Internal Medicine

## 2019-01-23 ENCOUNTER — Telehealth: Payer: Self-pay | Admitting: Orthopaedic Surgery

## 2019-01-23 DIAGNOSIS — L03116 Cellulitis of left lower limb: Secondary | ICD-10-CM | POA: Diagnosis not present

## 2019-01-23 DIAGNOSIS — N183 Chronic kidney disease, stage 3 unspecified: Secondary | ICD-10-CM

## 2019-01-23 DIAGNOSIS — D62 Acute posthemorrhagic anemia: Secondary | ICD-10-CM | POA: Diagnosis not present

## 2019-01-23 DIAGNOSIS — R7989 Other specified abnormal findings of blood chemistry: Secondary | ICD-10-CM

## 2019-01-23 NOTE — Telephone Encounter (Signed)
thanks

## 2019-01-23 NOTE — Telephone Encounter (Signed)
Noted! Thank you

## 2019-01-23 NOTE — Assessment & Plan Note (Addendum)
01/16/2019 post orthopedic surgery hemoglobin 10.3/hematocrit 33.3.  Preop values were 13.2/41.8.  She has no bleeding dyscrasias. Eliquis will be initiated and baby aspirin discontinued pending the results of the venous Doppler. CBC will be monitored.

## 2019-01-23 NOTE — Patient Instructions (Signed)
See assessment and plan under each diagnosis in the problem list and acutely for this visit 

## 2019-01-23 NOTE — Progress Notes (Signed)
     NURSING HOME LOCATION:  [facility] ROOM NUMBER:  [id number]  CODE STATUS:  Full Code  PCP: Maudie Mercury ,MD ( according to the patient she has not seen him yet)  This is a nursing facility follow up for specific acute issue of left calf pain and equivocal Homans sign but markedly elevated d-dimer.   Interim medical record and care since last [place] visit was updated with review of diagnostic studies and change in clinical status since last visit were documented.  HPI:  She had had a low-grade fever the morning of 7/20.  COVID screening had been negative and the patient had no symptoms to suggest corona viral infection.  My exam on that date suggested cellulitis of the left lateral knee.  Wound Care Nurse documented that the wound itself exhibited no cellulitis or purulence.On exam the calf was tender to light pressure without definitive positive Homans sign.  D-dimer was done at the [facility] lab and revealed a value of 9870 with normals of 190-500 NG/mL. Problematic is the patient believes she has stage IV kidney disease.  On 7/14 creatinine was 1.65, BUN 38, and GFR 31.Nephrology has seen her once to date. According to the medical literature (Up to Date); patients with advanced kidney disease were excluded from the [place] studies.  The manufacturers of Xarelto and Eliquis state there is no dose reduction if CCl > 15.   The literature also questions a possible decreased risk of fracture with apixaban (Eliquis).   Additionally the patient has developed postop anemia.  Preop hemoglobin/hematocrit were 13.2/41.8.  On 7/14 hemoglobin 10.3/hematocrit 33.3.  Review of systems: She denies any bleeding dyscrasias.  She was frustrated as venipuncture attempt was made at the dorsum of the right hand where she had a bruise.  She stated that after the attempt she "cried for 3 hours and her heart was pounding". She denies any calf pain today.  She also denies any cardiac  or pulmonary problems other than symptoms after the venipuncture attempt. She has had no recurrent fever and denies any symptoms of COVID-19. No chest pain, cough, sputum production, hemoptysis.   No heartburn, dysphagia, abdominal pain, nausea /vomiting, rectal bleeding, melena Genitourinary: No  hematuria, pyuria Hematologic/lymphatic: No significant lymphadenopathy, abnormal bleeding She is dissatisfied with the administration of her NovoLog as to temporal relationship to meals.  Physical exam:  Pertinent or positive findings: Mood as noted above.  When agitated she exhibits a wide-eyed stare mimicking that of hyperthyroidism.  Breath sounds are decreased.  Clinically there was significant improvement in the slight erythema to the left of the operative site.  It was difficult to discern today compared to 7/20.  The calf was not tender to compression today despite the elevated d-dimer.  General appearance: Adequately nourished; no acute distress, increased work of breathing is present.   Lymphatic: No lymphadenopathy about the head, neck, axilla. Heart:  Normal rate and regular rhythm. S1 and S2 normal without gallop, murmur, click, rub .  Lungs: without wheezes, rhonchi, rales, rubs. GU: Deferred  Extremities:  No cyanosis, clubbing, edema  Neurologic exam : Balance, Rhomberg, finger to nose testing could not be completed due to clinical state Skin: Warm & dry w/o tenting. No significant lesions .  See summary under each active problem in the Problem List with associated updated therapeutic plan

## 2019-01-23 NOTE — Telephone Encounter (Signed)
Xu called yesterday

## 2019-01-23 NOTE — Telephone Encounter (Signed)
ok 

## 2019-01-23 NOTE — Assessment & Plan Note (Addendum)
Stat venous Doppler. Eliquis 10 mg twice daily for 7 days then 5 mg twice a day.  Attempts will be made to have her seen at least virtually by her Nephrologist as there is some controversy about use of DOACs with advanced kidney disease. Low-dose aspirin will be discontinued.

## 2019-01-23 NOTE — Assessment & Plan Note (Addendum)
01/16/2019 creatinine 1.65, BUN 38, GFR 31.  Nephrology will be consulted as to the stage of her CKD and any dosage adjustment of DOACs.  Renal function will be monitored pending completion of that consultation.

## 2019-01-23 NOTE — Telephone Encounter (Signed)
Did you know about this?   

## 2019-01-23 NOTE — Telephone Encounter (Signed)
Toni Hill/Nurse/[facility] called and stated that patient has possible DVT and they are doing venous doppler and put on Eliquis and labs were drawn. Md felt that the virtual visit no necessary because she has improved. Just wanted to update.  [phone]

## 2019-01-23 NOTE — Telephone Encounter (Signed)
FYI

## 2019-01-24 ENCOUNTER — Inpatient Hospital Stay (HOSPITAL_COMMUNITY)
Admission: EM | Admit: 2019-01-24 | Discharge: 2019-01-31 | DRG: 683 | Disposition: A | Discharge status: Skilled Nursing Facility | Payer: Medicare Other | Source: Skilled Nursing Facility | Attending: Internal Medicine | Admitting: Internal Medicine

## 2019-01-24 ENCOUNTER — Encounter (HOSPITAL_COMMUNITY): Payer: Self-pay

## 2019-01-24 DIAGNOSIS — L03116 Cellulitis of left lower limb: Secondary | ICD-10-CM | POA: Diagnosis not present

## 2019-01-24 DIAGNOSIS — E1122 Type 2 diabetes mellitus with diabetic chronic kidney disease: Secondary | ICD-10-CM | POA: Diagnosis present

## 2019-01-24 DIAGNOSIS — Z90710 Acquired absence of both cervix and uterus: Secondary | ICD-10-CM

## 2019-01-24 DIAGNOSIS — K219 Gastro-esophageal reflux disease without esophagitis: Secondary | ICD-10-CM | POA: Diagnosis present

## 2019-01-24 DIAGNOSIS — N179 Acute kidney failure, unspecified: Secondary | ICD-10-CM | POA: Diagnosis not present

## 2019-01-24 DIAGNOSIS — E1142 Type 2 diabetes mellitus with diabetic polyneuropathy: Secondary | ICD-10-CM | POA: Diagnosis present

## 2019-01-24 DIAGNOSIS — F419 Anxiety disorder, unspecified: Secondary | ICD-10-CM | POA: Diagnosis present

## 2019-01-24 DIAGNOSIS — R9431 Abnormal electrocardiogram [ECG] [EKG]: Secondary | ICD-10-CM | POA: Diagnosis present

## 2019-01-24 DIAGNOSIS — E785 Hyperlipidemia, unspecified: Secondary | ICD-10-CM | POA: Diagnosis present

## 2019-01-24 DIAGNOSIS — Z803 Family history of malignant neoplasm of breast: Secondary | ICD-10-CM

## 2019-01-24 DIAGNOSIS — Z885 Allergy status to narcotic agent status: Secondary | ICD-10-CM

## 2019-01-24 DIAGNOSIS — T368X5A Adverse effect of other systemic antibiotics, initial encounter: Secondary | ICD-10-CM | POA: Diagnosis present

## 2019-01-24 DIAGNOSIS — Z9049 Acquired absence of other specified parts of digestive tract: Secondary | ICD-10-CM

## 2019-01-24 DIAGNOSIS — Z794 Long term (current) use of insulin: Secondary | ICD-10-CM

## 2019-01-24 DIAGNOSIS — F319 Bipolar disorder, unspecified: Secondary | ICD-10-CM | POA: Diagnosis present

## 2019-01-24 DIAGNOSIS — I129 Hypertensive chronic kidney disease with stage 1 through stage 4 chronic kidney disease, or unspecified chronic kidney disease: Secondary | ICD-10-CM | POA: Diagnosis present

## 2019-01-24 DIAGNOSIS — Z96652 Presence of left artificial knee joint: Secondary | ICD-10-CM | POA: Diagnosis present

## 2019-01-24 DIAGNOSIS — I4510 Unspecified right bundle-branch block: Secondary | ICD-10-CM | POA: Diagnosis present

## 2019-01-24 DIAGNOSIS — Z7901 Long term (current) use of anticoagulants: Secondary | ICD-10-CM

## 2019-01-24 DIAGNOSIS — M797 Fibromyalgia: Secondary | ICD-10-CM | POA: Diagnosis present

## 2019-01-24 DIAGNOSIS — Z888 Allergy status to other drugs, medicaments and biological substances status: Secondary | ICD-10-CM

## 2019-01-24 DIAGNOSIS — N184 Chronic kidney disease, stage 4 (severe): Secondary | ICD-10-CM | POA: Diagnosis present

## 2019-01-24 DIAGNOSIS — Z880 Allergy status to penicillin: Secondary | ICD-10-CM

## 2019-01-24 DIAGNOSIS — E039 Hypothyroidism, unspecified: Secondary | ICD-10-CM | POA: Diagnosis present

## 2019-01-24 DIAGNOSIS — Z79891 Long term (current) use of opiate analgesic: Secondary | ICD-10-CM

## 2019-01-24 DIAGNOSIS — Z79890 Hormone replacement therapy (postmenopausal): Secondary | ICD-10-CM

## 2019-01-24 DIAGNOSIS — Z79899 Other long term (current) drug therapy: Secondary | ICD-10-CM

## 2019-01-24 DIAGNOSIS — X58XXXA Exposure to other specified factors, initial encounter: Secondary | ICD-10-CM | POA: Diagnosis present

## 2019-01-24 DIAGNOSIS — Z20828 Contact with and (suspected) exposure to other viral communicable diseases: Secondary | ICD-10-CM | POA: Diagnosis present

## 2019-01-24 DIAGNOSIS — N281 Cyst of kidney, acquired: Secondary | ICD-10-CM | POA: Diagnosis present

## 2019-01-24 DIAGNOSIS — M1990 Unspecified osteoarthritis, unspecified site: Secondary | ICD-10-CM | POA: Diagnosis present

## 2019-01-24 DIAGNOSIS — M7989 Other specified soft tissue disorders: Secondary | ICD-10-CM

## 2019-01-24 NOTE — ED Triage Notes (Signed)
Pt comes via [facility] from [facility] after having abnormal lab values but they are unsure what ones, possibly d-dimer. Pt had recent knee surgery

## 2019-01-25 ENCOUNTER — Inpatient Hospital Stay (HOSPITAL_COMMUNITY): Payer: Medicare Other

## 2019-01-25 ENCOUNTER — Other Ambulatory Visit: Payer: Self-pay

## 2019-01-25 ENCOUNTER — Emergency Department (HOSPITAL_COMMUNITY): Payer: Medicare Other

## 2019-01-25 DIAGNOSIS — M1990 Unspecified osteoarthritis, unspecified site: Secondary | ICD-10-CM

## 2019-01-25 DIAGNOSIS — Z888 Allergy status to other drugs, medicaments and biological substances status: Secondary | ICD-10-CM | POA: Diagnosis not present

## 2019-01-25 DIAGNOSIS — F319 Bipolar disorder, unspecified: Secondary | ICD-10-CM

## 2019-01-25 DIAGNOSIS — R52 Pain, unspecified: Secondary | ICD-10-CM

## 2019-01-25 DIAGNOSIS — N184 Chronic kidney disease, stage 4 (severe): Secondary | ICD-10-CM | POA: Diagnosis present

## 2019-01-25 DIAGNOSIS — N189 Chronic kidney disease, unspecified: Secondary | ICD-10-CM | POA: Diagnosis not present

## 2019-01-25 DIAGNOSIS — N183 Chronic kidney disease, stage 3 (moderate): Secondary | ICD-10-CM | POA: Diagnosis not present

## 2019-01-25 DIAGNOSIS — M7989 Other specified soft tissue disorders: Secondary | ICD-10-CM

## 2019-01-25 DIAGNOSIS — I129 Hypertensive chronic kidney disease with stage 1 through stage 4 chronic kidney disease, or unspecified chronic kidney disease: Secondary | ICD-10-CM

## 2019-01-25 DIAGNOSIS — Z885 Allergy status to narcotic agent status: Secondary | ICD-10-CM

## 2019-01-25 DIAGNOSIS — R9431 Abnormal electrocardiogram [ECG] [EKG]: Secondary | ICD-10-CM | POA: Diagnosis present

## 2019-01-25 DIAGNOSIS — R7989 Other specified abnormal findings of blood chemistry: Secondary | ICD-10-CM | POA: Diagnosis not present

## 2019-01-25 DIAGNOSIS — N281 Cyst of kidney, acquired: Secondary | ICD-10-CM | POA: Diagnosis present

## 2019-01-25 DIAGNOSIS — Z20828 Contact with and (suspected) exposure to other viral communicable diseases: Secondary | ICD-10-CM | POA: Diagnosis present

## 2019-01-25 DIAGNOSIS — X58XXXA Exposure to other specified factors, initial encounter: Secondary | ICD-10-CM | POA: Diagnosis present

## 2019-01-25 DIAGNOSIS — I4510 Unspecified right bundle-branch block: Secondary | ICD-10-CM

## 2019-01-25 DIAGNOSIS — M79662 Pain in left lower leg: Secondary | ICD-10-CM | POA: Diagnosis not present

## 2019-01-25 DIAGNOSIS — Z79891 Long term (current) use of opiate analgesic: Secondary | ICD-10-CM | POA: Diagnosis not present

## 2019-01-25 DIAGNOSIS — E1122 Type 2 diabetes mellitus with diabetic chronic kidney disease: Secondary | ICD-10-CM

## 2019-01-25 DIAGNOSIS — E039 Hypothyroidism, unspecified: Secondary | ICD-10-CM

## 2019-01-25 DIAGNOSIS — R531 Weakness: Secondary | ICD-10-CM | POA: Diagnosis not present

## 2019-01-25 DIAGNOSIS — Z803 Family history of malignant neoplasm of breast: Secondary | ICD-10-CM | POA: Diagnosis not present

## 2019-01-25 DIAGNOSIS — Z79890 Hormone replacement therapy (postmenopausal): Secondary | ICD-10-CM | POA: Diagnosis not present

## 2019-01-25 DIAGNOSIS — Z7901 Long term (current) use of anticoagulants: Secondary | ICD-10-CM | POA: Diagnosis not present

## 2019-01-25 DIAGNOSIS — L03116 Cellulitis of left lower limb: Secondary | ICD-10-CM | POA: Diagnosis present

## 2019-01-25 DIAGNOSIS — Z880 Allergy status to penicillin: Secondary | ICD-10-CM | POA: Diagnosis not present

## 2019-01-25 DIAGNOSIS — M797 Fibromyalgia: Secondary | ICD-10-CM | POA: Diagnosis present

## 2019-01-25 DIAGNOSIS — E1142 Type 2 diabetes mellitus with diabetic polyneuropathy: Secondary | ICD-10-CM | POA: Diagnosis present

## 2019-01-25 DIAGNOSIS — Z794 Long term (current) use of insulin: Secondary | ICD-10-CM | POA: Diagnosis not present

## 2019-01-25 DIAGNOSIS — N179 Acute kidney failure, unspecified: Secondary | ICD-10-CM | POA: Diagnosis present

## 2019-01-25 DIAGNOSIS — Z7982 Long term (current) use of aspirin: Secondary | ICD-10-CM

## 2019-01-25 DIAGNOSIS — G8918 Other acute postprocedural pain: Secondary | ICD-10-CM | POA: Diagnosis not present

## 2019-01-25 DIAGNOSIS — K219 Gastro-esophageal reflux disease without esophagitis: Secondary | ICD-10-CM | POA: Diagnosis present

## 2019-01-25 DIAGNOSIS — Z79899 Other long term (current) drug therapy: Secondary | ICD-10-CM | POA: Diagnosis not present

## 2019-01-25 DIAGNOSIS — Z96652 Presence of left artificial knee joint: Secondary | ICD-10-CM

## 2019-01-25 LAB — CBC
HCT: 36.6 % (ref 36.0–46.0)
HCT: 33.1 % — ABNORMAL LOW (ref 36.0–46.0)
Hemoglobin: 11.5 g/dL — ABNORMAL LOW (ref 12.0–15.0)
Hemoglobin: 10.4 g/dL — ABNORMAL LOW (ref 12.0–15.0)
MCH: 32.7 pg (ref 26.0–34.0)
MCH: 32.7 pg (ref 26.0–34.0)
MCHC: 31.4 g/dL (ref 30.0–36.0)
MCHC: 31.4 g/dL (ref 30.0–36.0)
MCV: 104 fL — ABNORMAL HIGH (ref 80.0–100.0)
MCV: 104.1 fL — ABNORMAL HIGH (ref 80.0–100.0)
Platelets: 406 10*3/uL — ABNORMAL HIGH (ref 150–400)
Platelets: 386 10*3/uL (ref 150–400)
RBC: 3.52 MIL/uL — ABNORMAL LOW (ref 3.87–5.11)
RBC: 3.18 MIL/uL — ABNORMAL LOW (ref 3.87–5.11)
RDW: 12.7 % (ref 11.5–15.5)
RDW: 12.6 % (ref 11.5–15.5)
WBC: 7.2 10*3/uL (ref 4.0–10.5)
WBC: 6.2 10*3/uL (ref 4.0–10.5)
nRBC: 0 % (ref 0.0–0.2)
nRBC: 0 % (ref 0.0–0.2)

## 2019-01-25 LAB — BASIC METABOLIC PANEL
Anion gap: 12 (ref 5–15)
BUN: 40 mg/dL — ABNORMAL HIGH (ref 8–23)
CO2: 25 mmol/L (ref 22–32)
Calcium: 9.5 mg/dL (ref 8.9–10.3)
Chloride: 102 mmol/L (ref 98–111)
Creatinine, Ser: 2.39 mg/dL — ABNORMAL HIGH (ref 0.44–1.00)
GFR calc Af Amer: 23 mL/min — ABNORMAL LOW (ref >60)
GFR calc non Af Amer: 20 mL/min — ABNORMAL LOW (ref >60)
Glucose, Bld: 171 mg/dL — ABNORMAL HIGH (ref 70–99)
Potassium: 4.8 mmol/L (ref 3.5–5.1)
Sodium: 139 mmol/L (ref 135–145)

## 2019-01-25 LAB — URINALYSIS, ROUTINE W REFLEX MICROSCOPIC
Bilirubin Urine: NEGATIVE
Glucose, UA: NEGATIVE mg/dL
Hgb urine dipstick: NEGATIVE
Ketones, ur: NEGATIVE mg/dL
Leukocytes,Ua: NEGATIVE
Nitrite: NEGATIVE
Protein, ur: NEGATIVE mg/dL
Specific Gravity, Urine: 1.017 (ref 1.005–1.030)
pH: 5 (ref 5.0–8.0)

## 2019-01-25 LAB — TROPONIN I (HIGH SENSITIVITY)
Troponin I (High Sensitivity): 13 ng/L (ref <18)
Troponin I (High Sensitivity): 13 ng/L (ref <18)

## 2019-01-25 LAB — C-REACTIVE PROTEIN: CRP: 1.2 mg/dL — ABNORMAL HIGH (ref <1.0)

## 2019-01-25 LAB — SARS CORONAVIRUS 2 BY RT PCR (HOSPITAL ORDER, PERFORMED IN ~~LOC~~ HOSPITAL LAB): SARS Coronavirus 2: NEGATIVE

## 2019-01-25 LAB — D-DIMER, QUANTITATIVE: D-Dimer, Quant: 4.77 ug/mL-FEU — ABNORMAL HIGH (ref 0.00–0.50)

## 2019-01-25 LAB — SEDIMENTATION RATE: Sed Rate: 57 mm/hr — ABNORMAL HIGH (ref 0–22)

## 2019-01-25 LAB — GLUCOSE, CAPILLARY: Glucose-Capillary: 170 mg/dL — ABNORMAL HIGH (ref 70–99)

## 2019-01-25 LAB — TSH: TSH: 0.691 u[IU]/mL (ref 0.350–4.500)

## 2019-01-25 LAB — CBG MONITORING, ED
Glucose-Capillary: 82 mg/dL (ref 70–99)
Glucose-Capillary: 243 mg/dL — ABNORMAL HIGH (ref 70–99)
Glucose-Capillary: 213 mg/dL — ABNORMAL HIGH (ref 70–99)

## 2019-01-25 MED ORDER — OXYCODONE-ACETAMINOPHEN 5-325 MG PO TABS
1.0000 | ORAL_TABLET | Freq: Four times a day (QID) | ORAL | Status: DC | PRN
  Administered 2019-01-25 (×2): 2 via ORAL
  Administered 2019-01-26: 1 via ORAL
  Administered 2019-01-26: 2 via ORAL
  Filled 2019-01-25 (×3): qty 2
  Filled 2019-01-25: qty 1

## 2019-01-25 MED ORDER — HYDROCODONE-ACETAMINOPHEN 5-325 MG PO TABS
1.0000 | ORAL_TABLET | Freq: Once | ORAL | Status: AC
  Administered 2019-01-25: 1 via ORAL
  Filled 2019-01-25: qty 1

## 2019-01-25 MED ORDER — SODIUM CHLORIDE 0.9 % IV BOLUS
1000.0000 mL | Freq: Once | INTRAVENOUS | Status: AC
  Administered 2019-01-25: 1000 mL via INTRAVENOUS

## 2019-01-25 MED ORDER — ONDANSETRON HCL 4 MG PO TABS: 4.0000 mg | ORAL_TABLET | Freq: Four times a day (QID) | ORAL | Status: DC | PRN

## 2019-01-25 MED ORDER — ACETAMINOPHEN 650 MG RE SUPP: 650.0000 mg | Freq: Four times a day (QID) | RECTAL | Status: DC | PRN

## 2019-01-25 MED ORDER — ONDANSETRON HCL 4 MG/2ML IJ SOLN: 4.0000 mg | Freq: Four times a day (QID) | INTRAMUSCULAR | Status: DC | PRN

## 2019-01-25 MED ORDER — HEPARIN (PORCINE) 25000 UT/250ML-% IV SOLN
1200.0000 [IU]/h | INTRAVENOUS | Status: DC
  Administered 2019-01-25: 1200 [IU]/h via INTRAVENOUS
  Filled 2019-01-25: qty 250

## 2019-01-25 MED ORDER — INSULIN GLARGINE 100 UNIT/ML ~~LOC~~ SOLN
15.0000 [IU] | Freq: Every day | SUBCUTANEOUS | Status: DC
  Administered 2019-01-25 – 2019-01-30 (×6): 15 [IU] via SUBCUTANEOUS
  Filled 2019-01-25 (×7): qty 0.15

## 2019-01-25 MED ORDER — BISACODYL 10 MG RE SUPP: 10.0000 mg | RECTAL | Status: DC | PRN

## 2019-01-25 MED ORDER — SIMVASTATIN 20 MG PO TABS
40.0000 mg | ORAL_TABLET | Freq: Every evening | ORAL | Status: DC
  Administered 2019-01-25 – 2019-01-30 (×6): 40 mg via ORAL
  Filled 2019-01-25 (×6): qty 2

## 2019-01-25 MED ORDER — SENNA 8.6 MG PO TABS
1.0000 | ORAL_TABLET | Freq: Two times a day (BID) | ORAL | Status: DC
  Filled 2019-01-25 (×2): qty 1

## 2019-01-25 MED ORDER — APIXABAN 5 MG PO TABS
10.0000 mg | ORAL_TABLET | Freq: Two times a day (BID) | ORAL | Status: DC
  Administered 2019-01-25 (×2): 10 mg via ORAL
  Filled 2019-01-25 (×3): qty 2

## 2019-01-25 MED ORDER — SERTRALINE HCL 100 MG PO TABS
100.0000 mg | ORAL_TABLET | Freq: Every day | ORAL | Status: DC
  Administered 2019-01-25 – 2019-01-31 (×7): 100 mg via ORAL
  Filled 2019-01-25 (×7): qty 1

## 2019-01-25 MED ORDER — APIXABAN 5 MG PO TABS: 5.0000 mg | ORAL_TABLET | Freq: Two times a day (BID) | ORAL | Status: DC

## 2019-01-25 MED ORDER — SODIUM CHLORIDE 0.9 % IV SOLN
1.0000 g | Freq: Once | INTRAVENOUS | Status: AC
  Administered 2019-01-25: 1 g via INTRAVENOUS
  Filled 2019-01-25: qty 10

## 2019-01-25 MED ORDER — GABAPENTIN 100 MG PO CAPS
100.0000 mg | ORAL_CAPSULE | Freq: Two times a day (BID) | ORAL | Status: DC
  Administered 2019-01-25 – 2019-01-31 (×13): 100 mg via ORAL
  Filled 2019-01-25 (×13): qty 1

## 2019-01-25 MED ORDER — INSULIN ASPART 100 UNIT/ML ~~LOC~~ SOLN
0.0000 [IU] | Freq: Three times a day (TID) | SUBCUTANEOUS | Status: DC
  Administered 2019-01-25: 5 [IU] via SUBCUTANEOUS
  Administered 2019-01-26 (×3): 3 [IU] via SUBCUTANEOUS
  Administered 2019-01-27: 5 [IU] via SUBCUTANEOUS
  Administered 2019-01-27: 3 [IU] via SUBCUTANEOUS
  Administered 2019-01-27: 2 [IU] via SUBCUTANEOUS
  Administered 2019-01-28 – 2019-01-29 (×4): 5 [IU] via SUBCUTANEOUS
  Administered 2019-01-29: 2 [IU] via SUBCUTANEOUS
  Administered 2019-01-29: 3 [IU] via SUBCUTANEOUS
  Administered 2019-01-30: 5 [IU] via SUBCUTANEOUS
  Administered 2019-01-30: 8 [IU] via SUBCUTANEOUS
  Administered 2019-01-30 – 2019-01-31 (×2): 5 [IU] via SUBCUTANEOUS

## 2019-01-25 MED ORDER — POLYETHYLENE GLYCOL 3350 17 G PO PACK
17.0000 g | PACK | Freq: Every day | ORAL | Status: DC | PRN
  Filled 2019-01-25: qty 1

## 2019-01-25 MED ORDER — ACETAMINOPHEN 325 MG PO TABS: 650.0000 mg | ORAL_TABLET | Freq: Four times a day (QID) | ORAL | Status: DC | PRN

## 2019-01-25 MED ORDER — LEVOTHYROXINE SODIUM 25 MCG PO TABS
125.0000 ug | ORAL_TABLET | Freq: Every day | ORAL | Status: DC
  Administered 2019-01-25 – 2019-01-31 (×7): 125 ug via ORAL
  Filled 2019-01-25 (×7): qty 1

## 2019-01-25 NOTE — Plan of Care (Signed)

## 2019-01-25 NOTE — ED Notes (Signed)
Pt returned from [facility] at this time.

## 2019-01-25 NOTE — ED Notes (Signed)
Lunch tray ordered for pt.  Dietary states that the order has already been processed.

## 2019-01-25 NOTE — Progress Notes (Signed)
ANTICOAGULATION CONSULT NOTE - Initial Consult  Pharmacy Consult for heparin Indication: DVT  Allergies  Allergen Reactions  . Morphine And Related Nausea And Vomiting  . Vioxx [Rofecoxib]     GI bleeding  . Penicillins Hives and Itching    Has patient had a PCN reaction causing immediate rash, facial/tongue/throat swelling, SOB or lightheadedness with hypotension: No Has patient had a PCN reaction causing severe rash involving mucus membranes or skin necrosis: No Has patient had a PCN reaction that required hospitalization: no Has patient had a PCN reaction occurring within the last 10 years: No If all of the above answers are "NO", then may proceed with Cephalosporin use.     Patient Measurements:   Heparin Dosing Weight: 89kg  Vital Signs: Temp: 97.7 F (36.5 C) (07/22 2006) Temp Source: Oral (07/22 2006) BP: 131/51 (07/23 0400) Pulse Rate: 76 (07/23 0400)  Labs: Recent Labs    01/25/19 0125  HGB 11.5*  HCT 36.6  PLT 406*  CREATININE 2.39*  TROPONINIHS 13    Estimated Creatinine Clearance: 27.3 mL/min (A) (by C-G formula based on SCr of 2.39 mg/dL (H)).   Medical History: Past Medical History:  Diagnosis Date  . Bilateral lower extremity edema    right > left  . Bipolar 1 disorder ([place])   . CKD (chronic kidney disease) stage 3, GFR 30-59 ml/min (HCC)     Dr Lawson Radar, [facility]  . Diabetes, polyneuropathy ([place])    FEET AND FINGER TIPS  . Fibromyalgia   . GERD (gastroesophageal reflux disease)   . History of chronic gastritis   . Hyperlipidemia   . Hypertension    per pt was take off bp medication because of kidney disease told by her nephrologist  . Hypothyroidism   . Mixed incontinence urge and stress   . OA (osteoarthritis)    KNEES  . OAB (overactive bladder)   . PONV (postoperative nausea and vomiting)   . 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 Type 2 diabetes mellitus treated with insulin ([place])   . Wears glasses       Assessment: 65 yoF with recent knee surgery admitted with DVT. Pt noted to have DVT 7/21 and was started on apixaban (LD 7/22 1700) outpatient, now to transition to IV heparin. Will utilize aPTTs to guide dosing given recent DOAC use.   Goal of Therapy:  Heparin level 0.3-0.7 units/ml aPTT 66-102 seconds Monitor platelets by anticoagulation protocol: Yes   Plan:  -Start heparin 1200 units/hr with no bolus -Check 8hr aPTT and heparin level -Daily aPTT and heparin level until correlating   Arrie Senate, PharmD, BCPS Clinical Pharmacist Please check AMION for all [facility] numbers 01/25/2019

## 2019-01-25 NOTE — ED Provider Notes (Signed)
[facility] EMERGENCY DEPARTMENT Provider Note  CSN: [id number] Arrival date & time: 01/24/19 2009  Chief Complaint(s) Abnormal Lab  HPI Toni Hill is a 73 y.o. female with a past medical history listed below including diabetes, recent left total knee replacements performed 10 days ago, CKD who presents to the emergency department with left knee redness and elevated dimer at the facility.  Patient reports that they just started her on doxycycline and Eliquis without confirming infection or clot.  Reports that she was supposed to have a virtual follow-up with Dr. Erlinda Hong but this was canceled.  No fevers or chills.  No trauma.  No other physical complaints.  HPI   Past Medical History Past Medical History:  Diagnosis Date   Bilateral lower extremity edema    right > left   Bipolar 1 disorder (HCC)    CKD (chronic kidney disease) stage 3, GFR 30-59 ml/min (HCC)     Dr Lawson Radar, [facility]   Diabetes, polyneuropathy ([place])    FEET AND FINGER TIPS   Fibromyalgia    GERD (gastroesophageal reflux disease)    History of chronic gastritis    Hyperlipidemia    Hypertension    per pt was take off bp medication because of kidney disease told by her nephrologist   Hypothyroidism    Mixed incontinence urge and stress    OA (osteoarthritis)    KNEES   OAB (overactive bladder)    PONV (postoperative nausea and vomiting)    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   Type 2 diabetes mellitus treated with insulin ([place])    Wears glasses    Patient Active Problem List   Diagnosis Date Noted   AKI (acute kidney injury) ([place]) 01/25/2019   Elevated d-dimer 01/23/2019   Acute blood loss as cause of postoperative anemia 01/23/2019   Cellulitis of leg, left 01/23/2019   At risk for adverse drug event 01/22/2019   Bipolar 1 disorder ([place])    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 Authorizing Provider  apixaban (ELIQUIS) 5 MG TABS tablet Take 10 mg by mouth 2 (two) times daily.   Yes [provider]  bisacodyl (DULCOLAX) 10 MG suppository Place 10 mg rectally as needed for moderate constipation.   Yes [provider]  Calcium Carb-Cholecalciferol (CALCIUM 600/VITAMIN D3) 600-800 MG-UNIT TABS Take 1 tablet by mouth daily.    Yes [provider]  Cholecalciferol (EQL VITAMIN D3) 50 MCG (2000 UT) CAPS Take 2,000 Units by mouth daily.    Yes [provider]  doxycycline (VIBRA-TABS) 100 MG tablet Take 100 mg by mouth 2 (two) times daily.   Yes [provider]  ezetimibe (ZETIA) 10 MG tablet Take 10 mg by mouth every evening.    Yes [provider]  gabapentin (NEURONTIN) 100 MG capsule Take 1 capsule (100 mg total) by mouth 2 (two) times daily. 12/28/18  Yes Isabelle Course, MD  hydrocortisone 2.5 % cream Apply 1 application topically 2 (two) times daily as needed (dry skin).   Yes [provider]  insulin aspart (NOVOLOG) 100 UNIT/ML injection Inject 0-9 Units into the skin 3 (three) times daily with meals. Less than 100= 0 units 101-150= 5 units 151-200= 7 units Greater than 200= 9 units   Yes [provider]  Insulin Glargine (BASAGLAR KWIKPEN) 100 UNIT/ML SOPN Inject 25 Units into the skin daily.    Yes [provider]  levothyroxine (SYNTHROID) 125 MCG tablet Take 125 mcg by mouth daily before breakfast.   Yes [provider]  lisinopril (ZESTRIL) 10 MG tablet Take 10 mg by mouth daily.  12/29/18  Yes [provider]  ondansetron (ZOFRAN) 4 MG tablet Take 4 mg by mouth every 8 (eight) hours as needed for nausea or vomiting.   Yes [provider]  oxyCODONE-acetaminophen (PERCOCET/ROXICET) 5-325 MG tablet Take 1 tablet by mouth every 8 (eight) hours as needed for severe pain.   Yes [provider]  senna-docusate (SENOKOT-S) 8.6-50 MG tablet Take 2 tablets by mouth at bedtime as needed for mild constipation.    Yes [provider]  sertraline (ZOLOFT) 100 MG tablet Take 100 mg by mouth daily.    Yes [provider]  simvastatin (ZOCOR) 40 MG tablet Take 40 mg by mouth every evening.    Yes [provider]  Sodium Phosphates (RA SALINE ENEMA) 19-7 GM/118ML ENEM Place 1 each rectally as needed (for constipation).   Yes [provider]  aspirin EC 81 MG tablet Take 1 tablet (81 mg total) by mouth 2 (two) times daily. Patient not taking: Reported on 01/25/2019 01/15/19   Leandrew Koyanagi, MD  methocarbamol (ROBAXIN) 750 MG tablet Take 1 tablet (750 mg total) by mouth 2 (two) times daily as needed for muscle spasms. Patient not taking: Reported on 01/23/2019 01/15/19   Leandrew Koyanagi, MD  sulfamethoxazole-trimethoprim (BACTRIM DS) 800-160 MG tablet Take 1 tablet by mouth 2 (two) times daily. Patient not taking: Reported on 01/23/2019 01/15/19   Leandrew Koyanagi, MD                                                                                                                                    Past Surgical History Past Surgical History:  Procedure Laterality Date   CHOLECYSTECTOMY OPEN  1990's   COLONOSCOPY  last one 07-17-2014   CYSTOSCOPY WITH INJECTION N/A 10/29/2016   Procedure: CYSTOSCOPY WITH INJECTION BOTOX 100 UNITS, urethral dilation;  Surgeon: Carolan Clines, MD;  Location: [facility];  Service: Urology;  Laterality: N/A;   ESOPHAGOGASTRODUODENOSCOPY  last one 07-01-2016   EXCISIONAL BREAST BX  1990     BENIGN   ORIF TIBIA & FIBULA FRACTURES  2010   retained rod   TONSILLECTOMY  age 38   TOTAL KNEE ARTHROPLASTY Left 01/15/2019   Procedure: LEFT TOTAL KNEE ARTHROPLASTY;  Surgeon: Leandrew Koyanagi, MD;  Location: [facility];  Service: Orthopedics;  Laterality: Left;   VAGINAL HYSTERECTOMY  1979   VENTRAL HERNIA REPAIR  90's   Family History Family History  Problem Relation Age of Onset   Breast cancer Mother 27   Cancer Father        ? type   Breast cancer Sister 28   Breast cancer Maternal Grandmother        ? age    Social History Social History   Tobacco Use   Smoking status: Never Smoker   Smokeless tobacco: Never Used  Substance Use Topics   Alcohol use: No   Drug use: No   Allergies Morphine and related, Vioxx [rofecoxib], and Penicillins  Review of Systems Review of Systems All other systems are reviewed and are negative for acute change except as noted in the HPI  Physical Exam Vital Signs  I have reviewed the triage vital signs BP (!) 121/45    Pulse 74    Temp 97.7 F (36.5 C) (Oral)    Resp 20    SpO2 95%   Physical Exam Vitals signs reviewed.  Constitutional:      General: She is not in acute distress.    Appearance: She is well-developed. She is not diaphoretic.  HENT:     Head: Normocephalic and atraumatic.     Right Ear: External ear normal.     Left Ear: External ear normal.     Nose: Nose normal.  Eyes:     General: No scleral icterus.    Conjunctiva/sclera: Conjunctivae normal.  Neck:     Musculoskeletal: Normal range of motion.     Trachea: Phonation normal.  Cardiovascular:     Rate and Rhythm: Normal rate and regular rhythm.  Pulmonary:     Effort: Pulmonary effort is normal. No respiratory distress.     Breath sounds: No stridor.  Abdominal:     General: There is no distension.  Musculoskeletal: Normal range of motion.       Legs:  Neurological:     Mental Status: She is alert and oriented to person, place, and time.   Psychiatric:        Behavior: Behavior normal.     ED Results and Treatments Labs (all labs ordered are listed, but only abnormal results are displayed) Labs Reviewed  BASIC METABOLIC PANEL - Abnormal; Notable for the following components:      Result Value   Glucose, Bld 171 (*)    BUN 40 (*)    Creatinine, Ser 2.39 (*)    GFR calc non Af Amer 20 (*)    GFR calc Af Amer 23 (*)    All other components within normal limits  CBC - Abnormal; Notable for the following components:   RBC 3.52 (*)    Hemoglobin 11.5 (*)    MCV 104.0 (*)    Platelets 406 (*)    All other components within normal limits  D-DIMER, QUANTITATIVE (NOT AT [facility]) - Abnormal; Notable for the following components:   D-Dimer, Quant 4.77 (*)    All other components within normal limits  SARS CORONAVIRUS 2 (HOSPITAL ORDER, [facility])  C-REACTIVE PROTEIN  SEDIMENTATION RATE  HEPARIN LEVEL (UNFRACTIONATED)  APTT  CBC  URINALYSIS, ROUTINE W REFLEX MICROSCOPIC  TSH  TROPONIN I (HIGH SENSITIVITY)  TROPONIN I (HIGH SENSITIVITY)  EKG  EKG Interpretation  Date/Time:  Thursday January 25 2019 01:39:03 EDT Ventricular Rate:  81 PR Interval:    QRS Duration: 142 QT Interval:  430 QTC Calculation: 500 R Axis:   90 Text Interpretation:  Sinus rhythm RBBB and LPFB No significant change since last tracing Reconfirmed by Addison Lank [phone]) on 01/25/2019 3:03:36 AM      Radiology Dg Chest 2 View  Result Date: 01/25/2019 CLINICAL DATA:  73 year old female with palpitation. EXAM: CHEST - 2 VIEW COMPARISON:  Chest radiograph dated 01/10/2019 FINDINGS: Mild eventration of the right hemidiaphragm. No focal consolidation, pleural effusion, or pneumothorax. Stable cardiac silhouette. No acute osseous pathology. IMPRESSION: No active cardiopulmonary disease. Electronically  Signed   By: Anner Crete M.D.   On: 01/25/2019 02:10    Pertinent labs & imaging results that were available during my care of the patient were reviewed by me and considered in my medical decision making (see chart for details).  Medications Ordered in ED Medications  heparin ADULT infusion 100 units/mL (25000 units/257mL sodium chloride 0.45%) (1,200 Units/hr Intravenous New Bag/Given 01/25/19 0510)  oxyCODONE-acetaminophen (PERCOCET/ROXICET) 5-325 MG per tablet 1-2 tablet (has no administration in time range)  simvastatin (ZOCOR) tablet 40 mg (has no administration in time range)  sertraline (ZOLOFT) tablet 100 mg (has no administration in time range)  levothyroxine (SYNTHROID) tablet 125 mcg (has no administration in time range)  bisacodyl (DULCOLAX) suppository 10 mg (has no administration in time range)  gabapentin (NEURONTIN) capsule 100 mg (has no administration in time range)  acetaminophen (TYLENOL) tablet 650 mg (has no administration in time range)    Or  acetaminophen (TYLENOL) suppository 650 mg (has no administration in time range)  senna (SENOKOT) tablet 8.6 mg (has no administration in time range)  polyethylene glycol (MIRALAX / GLYCOLAX) packet 17 g (has no administration in time range)  ondansetron (ZOFRAN) tablet 4 mg (has no administration in time range)    Or  ondansetron (ZOFRAN) injection 4 mg (has no administration in time range)  insulin glargine (LANTUS) injection 15 Units (has no administration in time range)  insulin aspart (novoLOG) injection 0-15 Units (has no administration in time range)  cefTRIAXone (ROCEPHIN) 1 g in sodium chloride 0.9 % 100 mL IVPB (0 g Intravenous Stopped 01/25/19 0541)  sodium chloride 0.9 % bolus 1,000 mL (1,000 mLs Intravenous New Bag/Given 01/25/19 0502)  HYDROcodone-acetaminophen (NORCO/VICODIN) 5-325 MG per tablet 1 tablet (1 tablet Oral Given 01/25/19 0500)                                                                                                                                     Procedures Procedures  (including critical care time)  Medical Decision Making / ED Course I have reviewed the nursing notes for this encounter and the patient's prior records (if available in EHR or on provided paperwork).   [name] was evaluated in Emergency Department  on 01/25/2019 for the symptoms described in the history of present illness. She was evaluated in the context of the global COVID-19 pandemic, which necessitated consideration that the patient might be at risk for infection with the SARS-CoV-2 virus that causes COVID-19. Institutional protocols and algorithms that pertain to the evaluation of patients at risk for COVID-19 are in a state of rapid change based on information released by regulatory bodies including the CDC and federal and state organizations. These policies and algorithms were followed during the patient's care in the ED.  Left knee pain and redness Recent surgery Possible infection versus DVT Patient able to range, so I have a low suspicion for intra-articular infection. Treated empirically for cellulitis. Labs are reassuring without leukocytosis however patient does have worsening renal function as of 8 days ago.  Reports that she is hydrating. Dimer also elevated.  Case discussed with medicine who will admit the patient for further work-up of her AKI and to rule out DVT. Discussed case with orthopedic surgery who will inform Dr. Erlinda Hong.      Final Clinical Impression(s) / ED Diagnoses Final diagnoses:  Cellulitis of left lower extremity      This chart was dictated using voice recognition software.  Despite best efforts to proofread,  errors can occur which can change the documentation meaning.   Fatima Blank, MD 01/25/19 [phone]

## 2019-01-25 NOTE — Progress Notes (Signed)
Student Pharmacist rounding with the IMTS-B1 service was consulted on the optimization of anticoagulation therapy. The patient is a 73 year old female admitted to the hospital for left calf pain. Prior to admission, she was at a rehab facility following left total knee arthroplasty on 7/13 and only on aspirin 81 mg twice daily for DVT prophylaxis. At the facility, she experienced new onset calf swelling. Given concern for DVT with the recent left knee surgery, she was placed on Eliquis 10mg  twice daily for DVT treatment. She was managed with an IV heparin infusion at 1200 units/hr in the ED. I have reviewed the patient's profile and her weight is 103.9kg and most recent creatinine is 2.39mg /dL After a discussion with the medicine team, she was restarted on Eliquis 10mg  twice daily for the treatment of DVT. The medicine team is currently evaluating for confirmation of the DVT. Continue to monitor for symptoms of bleeding, changes in hemoglobin, and clinical improvement.  Youlanda Roys, 4th year Stage manager

## 2019-01-25 NOTE — ED Notes (Signed)
ED TO INPATIENT HANDOFF REPORT  ED Nurse Name and Phone #:  Otila Kluver @ [phone]  S Name/Age/Gender Toni Hill 73 y.o. female Room/Bed: 017C/017C  Code Status   Code Status: Prior  Home/SNF/Other Rehab Patient oriented to: self, place and situation Is this baseline? Yes   Triage Complete: Triage complete  Chief Complaint needs blood test  Triage Note Pt comes via [facility] from [facility] after having abnormal lab values but they are unsure what ones, possibly d-dimer. Pt had recent knee surgery    Allergies Allergies  Allergen Reactions  . Morphine And Related Nausea And Vomiting  . Vioxx [Rofecoxib]     GI bleeding  . Penicillins Hives and Itching    Has patient had a PCN reaction causing immediate rash, facial/tongue/throat swelling, SOB or lightheadedness with hypotension: No Has patient had a PCN reaction causing severe rash involving mucus membranes or skin necrosis: No Has patient had a PCN reaction that required hospitalization: no Has patient had a PCN reaction occurring within the last 10 years: No If all of the above answers are "NO", then may proceed with Cephalosporin use.     Level of Care/Admitting Diagnosis ED Disposition    None      B Medical/Surgery History Past Medical History:  Diagnosis Date  . Bilateral lower extremity edema    right > left  . Bipolar 1 disorder ([place])   . CKD (chronic kidney disease) stage 3, GFR 30-59 ml/min (HCC)     Dr Lawson Radar, [facility]  . Diabetes, polyneuropathy ([place])    FEET AND FINGER TIPS  . Fibromyalgia   . GERD (gastroesophageal reflux disease)   . History of chronic gastritis   . Hyperlipidemia   . Hypertension    per pt was take off bp medication because of kidney disease told by her nephrologist  . Hypothyroidism   . Mixed incontinence urge and stress   . OA (osteoarthritis)    KNEES  . OAB (overactive bladder)   . PONV (postoperative nausea and vomiting)   . Renal cyst    bilateral per  CT 06-27-2016  . Type 2 diabetes mellitus treated with insulin ([place])   . Wears glasses    Past Surgical History:  Procedure Laterality Date  . CHOLECYSTECTOMY OPEN  1990's  . COLONOSCOPY  last one 07-17-2014  . CYSTOSCOPY WITH INJECTION N/A 10/29/2016   Procedure: CYSTOSCOPY WITH INJECTION BOTOX 100 UNITS, urethral dilation;  Surgeon: Carolan Clines, MD;  Location: [place];  Service: Urology;  Laterality: N/A;  . ESOPHAGOGASTRODUODENOSCOPY  last one 07-01-2016  . EXCISIONAL BREAST BX  1990   BENIGN  . ORIF TIBIA & FIBULA FRACTURES  2010   retained rod  . TONSILLECTOMY  age 24  . TOTAL KNEE ARTHROPLASTY Left 01/15/2019   Procedure: LEFT TOTAL KNEE ARTHROPLASTY;  Surgeon: Leandrew Koyanagi, MD;  Location: [place];  Service: Orthopedics;  Laterality: Left;  Marland Kitchen VAGINAL HYSTERECTOMY  1979  . VENTRAL HERNIA REPAIR  1990's     A IV Location/Drains/Wounds Patient Lines/Drains/Airways Status   Active Line/Drains/Airways    Name:   Placement date:   Placement time:   Site:   Days:   Peripheral IV 11/20/16 Right;Posterior Forearm   11/20/16    0918    Forearm   796   Peripheral IV 01/25/19 Right Antecubital   01/25/19    0132    Antecubital   less than 1   Incision (Closed) 01/15/19 Leg Left  01/15/19    1229     10          Intake/Output Last 24 hours No intake or output data in the 24 hours ending 01/25/19 0521  Labs/Imaging Results for orders placed or performed during the hospital encounter of 01/24/19 (from the past 48 hour(s))  Basic metabolic panel     Status: Abnormal   Collection Time: 01/25/19  1:25 AM  Result Value Ref Range   Sodium 139 135 - 145 mmol/L   Potassium 4.8 3.5 - 5.1 mmol/L   Chloride 102 98 - 111 mmol/L   CO2 25 22 - 32 mmol/L   Glucose, Bld 171 (H) 70 - 99 mg/dL   BUN 40 (H) 8 - 23 mg/dL   Creatinine, Ser 2.39 (H) 0.44 - 1.00 mg/dL   Calcium 9.5 8.9 - 10.3 mg/dL   GFR calc non Af Amer 20 (L) >60 mL/min   GFR calc Af Amer 23 (L) >60  mL/min   Anion gap 12 5 - 15    Comment: Performed at [facility], [address], [place], [place] [address]  CBC     Status: Abnormal   Collection Time: 01/25/19  1:25 AM  Result Value Ref Range   WBC 7.2 4.0 - 10.5 K/uL   RBC 3.52 (L) 3.87 - 5.11 MIL/uL   Hemoglobin 11.5 (L) 12.0 - 15.0 g/dL   HCT 36.6 36.0 - 46.0 %   MCV 104.0 (H) 80.0 - 100.0 fL   MCH 32.7 26.0 - 34.0 pg   MCHC 31.4 30.0 - 36.0 g/dL   RDW 12.7 11.5 - 15.5 %   Platelets 406 (H) 150 - 400 K/uL   nRBC 0.0 0.0 - 0.2 %    Comment: Performed at [facility], [place] [address] [place], [place] [address]  Troponin I (High Sensitivity)     Status: None   Collection Time: 01/25/19  1:25 AM  Result Value Ref Range   Troponin I (High Sensitivity) 13 <18 ng/L    Comment: (NOTE) Elevated high sensitivity troponin I (hsTnI) values and significant  changes across serial measurements may suggest ACS but many other  chronic and acute conditions are known to elevate hsTnI results.  Refer to the "Links" section for chest pain algorithms and additional  guidance. Performed at [facility], [place] [address] [place], [place] [address]   D-dimer, quantitative (not at [facility])     Status: Abnormal   Collection Time: 01/25/19  1:25 AM  Result Value Ref Range   D-Dimer, Quant 4.77 (H) 0.00 - 0.50 ug/mL-FEU    Comment: (NOTE) At the manufacturer cut-off of 0.50 ug/mL FEU, this assay has been documented to exclude PE with a sensitivity and negative predictive value of 97 to 99%.  At this time, this assay has not been approved by the FDA to exclude DVT/VTE. Results should be correlated with clinical presentation. Performed at [facility], [place] [address], [place], [place] [address]   Troponin I (High Sensitivity)     Status: None   Collection Time: 01/25/19  3:36 AM  Result Value Ref Range   Troponin I (High Sensitivity) 13 <18 ng/L    Comment: (NOTE) Elevated high sensitivity troponin I (hsTnI) values and  significant  changes across serial measurements may suggest ACS but many other  chronic and acute conditions are known to elevate hsTnI results.  Refer to the "Links" section for chest pain algorithms and additional  guidance.  Performed at [facility], [place] [address] [place], [place] [address]    Dg Chest 2 View  Result Date: 01/25/2019 CLINICAL DATA:  73 year old female with palpitation. EXAM: CHEST - 2 VIEW COMPARISON:  Chest radiograph dated 01/10/2019 FINDINGS: Mild eventration of the right hemidiaphragm. No focal consolidation, pleural effusion, or pneumothorax. Stable cardiac silhouette. No acute osseous pathology. IMPRESSION: No active cardiopulmonary disease. Electronically Signed   By: Anner Crete M.D.   On: 01/25/2019 02:10    Pending Labs Unresulted Labs (From admission, onward)    Start     Ordered   01/26/19 0500  Heparin level (unfractionated)  Daily,   R     01/25/19 0453   01/26/19 0500  APTT  Daily,   R     01/25/19 0453   01/25/19 1300  Heparin level (unfractionated)  Once-Timed,   STAT     01/25/19 0453   01/25/19 1300  APTT  Once-Timed,   STAT     01/25/19 0453   01/25/19 0512  Urinalysis, Routine w reflex microscopic  Once,   STAT     01/25/19 0511   01/25/19 0500  CBC  Daily,   R     01/25/19 0453   01/25/19 0448  SARS Coronavirus 2 (CEPHEID - Performed in [place] hospital lab), Hosp Order  (Asymptomatic Patients Labs)  Once,   STAT    Question:  Rule Out  Answer:  Yes   01/25/19 0447   01/25/19 0444  C-reactive protein  Add-on,   AD     01/25/19 0443   01/25/19 0444  Sedimentation rate  Add-on,   AD     01/25/19 0443          Vitals/Pain Today's Vitals   01/25/19 0126 01/25/19 0200 01/25/19 0300 01/25/19 0400  BP: 129/77 129/64 (!) 137/59 (!) 131/51  Pulse: 83 83 79 76  Resp: 20 (!) 21 (!) 24 18  Temp:      TempSrc:      SpO2: 99% 97% 96% 94%  PainSc:        Isolation Precautions No active  isolations  Medications Medications  cefTRIAXone (ROCEPHIN) 1 g in sodium chloride 0.9 % 100 mL IVPB (1 g Intravenous New Bag/Given 01/25/19 0501)  heparin ADULT infusion 100 units/mL (25000 units/275mL sodium chloride 0.45%) (1,200 Units/hr Intravenous New Bag/Given 01/25/19 0510)  sodium chloride 0.9 % bolus 1,000 mL (1,000 mLs Intravenous New Bag/Given 01/25/19 0502)  HYDROcodone-acetaminophen (NORCO/VICODIN) 5-325 MG per tablet 1 tablet (1 tablet Oral Given 01/25/19 0500)    Mobility walks with device High fall risk   Focused Assessments Cardiac Assessment Handoff:  Cardiac Rhythm: Normal sinus rhythm No results found for: CKTOTAL, CKMB, CKMBINDEX, TROPONINI Lab Results  Component Value Date   DDIMER 4.77 (H) 01/25/2019   Does the Patient currently have chest pain? No     R Recommendations: See Admitting Provider Note  Report given to:   Additional Notes:  Pt had recent knee replacement (left) and has been at [facility].Denies CP, or SOB. Only c/o left knee pain.

## 2019-01-25 NOTE — H&P (Addendum)
Date: 01/25/2019               Patient Name:  Toni Hill MRN: [id number]  DOB: 1945/10/29 Age / Sex: 73 y.o., female   PCP: Maudie Mercury, MD         Medical Service: Internal Medicine Teaching Service         Attending Physician: Dr. Fatima Blank, *    First Contact: Dr. Charleen Kirks Pager: [phone]  Second Contact: Dr. Frederico Hamman Pager: [phone]       After Hours (After 5p/  First Contact Pager: [phone]  weekends / holidays): Second Contact Pager: [phone]   Chief Complaint: Left Calf Pain  History of Present Illness:  Toni Hill presented from [facility] center after a recent total knee replacement 10 days ago with leg pain. She states she was in her usual state of health until her calf started to hurt. She describes the pain as a 10/10 on the pain scale and is aggravated by movement. She was able to move her knee on Sunday, but it has begun hard to move her knee now due to her pain. She states she noticed that her left leg  became more swollen and she could not wear her compression stocking due to it 'cutting off circulation.' She states she cannot recall when these symptoms started but her doctor at [facility] checked her blood test and was told that her 'numbers' were too high, but she was told she does not have a blood clot. She states her daughter told the doctor that she wanted her to be evaluated at [facility] 'to be sure'. She denies nausea, vomiting, myalgias, or constipation, painful breathing, or chest pain. She states that she has multiple bowel movements, and urinary frequency, but that is her baseline.    While chart reviewing, She received ASA 81 mg BID with compression stockings for DVT prevention after her surgery 10 days ago. the ED physician from [facility] noted a positive homans sign, with an elevated D-dimer, but due to her decreased swelling stated that the patient was stable. She was discharged from [facility] with Eliquis.   Meds:  Current Meds    Medication Sig   apixaban (ELIQUIS) 5 MG TABS tablet Take 10 mg by mouth 2 (two) times daily.   bisacodyl (DULCOLAX) 10 MG suppository Place 10 mg rectally as needed for moderate constipation.   Calcium Carb-Cholecalciferol (CALCIUM 600/VITAMIN D3) 600-800 MG-UNIT TABS Take 1 tablet by mouth daily.    Cholecalciferol (EQL VITAMIN D3) 50 MCG (2000 UT) CAPS Take 2,000 Units by mouth daily.    doxycycline (VIBRA-TABS) 100 MG tablet Take 100 mg by mouth 2 (two) times daily.   ezetimibe (ZETIA) 10 MG tablet Take 10 mg by mouth every evening.    gabapentin (NEURONTIN) 100 MG capsule Take 1 capsule (100 mg total) by mouth 2 (two) times daily.   hydrocortisone 2.5 % cream Apply 1 application topically 2 (two) times daily as needed (dry skin).   insulin aspart (NOVOLOG) 100 UNIT/ML injection Inject 0-9 Units into the skin 3 (three) times daily with meals. Less than 100= 0 units 101-150= 5 units 151-200= 7 units Greater than 200= 9 units   Insulin Glargine (BASAGLAR KWIKPEN) 100 UNIT/ML SOPN Inject 25 Units into the skin daily.    levothyroxine (SYNTHROID) 125 MCG tablet Take 125 mcg by mouth daily before breakfast.   lisinopril (ZESTRIL) 10 MG tablet Take 10 mg by mouth daily.   ondansetron (ZOFRAN) 4 MG tablet  Take 4 mg by mouth every 8 (eight) hours as needed for nausea or vomiting.   oxyCODONE-acetaminophen (PERCOCET/ROXICET) 5-325 MG tablet Take 1 tablet by mouth every 8 (eight) hours as needed for severe pain.   senna-docusate (SENOKOT-S) 8.6-50 MG tablet Take 2 tablets by mouth at bedtime as needed for mild constipation.    sertraline (ZOLOFT) 100 MG tablet Take 100 mg by mouth daily.    simvastatin (ZOCOR) 40 MG tablet Take 40 mg by mouth every evening.    Sodium Phosphates (RA SALINE ENEMA) 19-7 GM/118ML ENEM Place 1 each rectally as needed (for constipation).     Allergies: Allergies as of 01/24/2019 - Review Complete 01/24/2019  Allergen Reaction Noted   Morphine and  related Nausea And Vomiting 12/01/2018   Vioxx [rofecoxib]  11/20/2016   Penicillins Hives and Itching 10/27/2016   Past Medical History:  Diagnosis Date   Bilateral lower extremity edema    right > left   Bipolar 1 disorder (HCC)    CKD (chronic kidney disease) stage 3, GFR 30-59 ml/min (HCC)     Dr Lawson Radar, [facility]   Diabetes, polyneuropathy ([place])    FEET AND FINGER TIPS   Fibromyalgia    GERD (gastroesophageal reflux disease)    History of chronic gastritis    Hyperlipidemia    Hypertension    per pt was take off bp medication because of kidney disease told by her nephrologist   Hypothyroidism    Mixed incontinence urge and stress    OA (osteoarthritis)    KNEES   OAB (overactive bladder)    PONV (postoperative nausea and vomiting)    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   Type 2 diabetes mellitus treated with insulin ([place])    Wears glasses     Family History:  Family History  Problem Relation Age of Onset   Breast cancer Mother 69   Cancer Father        ? type   Breast cancer Sister 70   Breast cancer Maternal Grandmother        ? age    Social History:  Social History   Tobacco Use   Smoking status: Never Smoker   Smokeless tobacco: Never Used  Substance Use Topics   Alcohol use: No   Drug use: No    Review of Systems: A complete ROS was negative except as per HPI.   Physical Exam: Blood pressure (!) 131/51, pulse 76, temperature 97.7 F (36.5 C), temperature source Oral, resp. rate 18, SpO2 94 %.  Physical Exam  Constitutional: She is oriented to person, place, and time. She appears distressed.  HENT:  Head: Normocephalic and atraumatic.  Cardiovascular: Normal rate, regular rhythm and intact distal pulses. Exam reveals no gallop and no friction rub.  No murmur heard. Pulmonary/Chest: Effort normal and breath sounds normal. No respiratory distress. She has no wheezes. She has no rales. She exhibits  no tenderness.  Abdominal: Soft. Bowel sounds are normal. She exhibits no distension. There is no abdominal tenderness. There is no rebound and no guarding.  Musculoskeletal:        General: Edema present.     Comments: - Edema is present bilaterally in the lower extremities with Left asymmetrically larger than the R. - Homans sign positive in the L lower extremity.    Neurological: She is oriented to person, place, and time.  Skin: She is not diaphoretic.  Skin around lower left extremity is warmer than R.  EKG: personally reviewed my interpretation is sinus rhythm with RBBB  CXR: personally reviewed my interpretation is atraumatic, with no signs of consolidation, pleural effusion, or cardiac abnormalities.   Assessment & Plan by Problem: Active Problems:   * No active hospital problems. * Assessment: Ms. Siddalee Vanderheiden is a 73 y/o female who presents to [facility] with L calf pain.   Due to her recent surgery, immobilization of her leg, elevated D-dimer (4.77), positive homans, and FH of DVT and PE, and negative for pain on inspiration DVT is primary on the differential while PE is lower on the differential, but cannot be ruled out at this time.   Plan:  Suspected L Lower Extremity DVT: Due to her PSHx, presentation, and physical DVT is highly suspect.  - Tylenol 650 mg - Dulcolax 10 mg - Heparin infusion - Zofran - oxycodone - Miralax - Simvastatin  Acute Kidney Injury:  Her creatinine is elevated at 2.39 while her baseline is ~1.65. We will be monitoring her kidney function while she is anticoagulated.  - Heperin infusion   Hypothyroid:  Will continue her medication for hypothyroidism - Synthroid 125 mg  Bipolor 1:  Will continue her medication for Bipolor - Zoloft 100 mg      Dispo: Admit patient to Inpatient with expected length of stay greater than 2 midnights.  Signed: Mosetta Anis, MD 01/25/2019, 5:22 AM  Pager: [phone]

## 2019-01-25 NOTE — ED Notes (Signed)
CBG not checked and insulin not given at 12:00 by previous RN.

## 2019-01-25 NOTE — ED Notes (Signed)
Dinner tray ordered for pt.   Pt back on stretcher at this time.  Placed on cardiac monitor.

## 2019-01-25 NOTE — Progress Notes (Addendum)
Subjective:  Today patient reports continued left lower extremity pain with limited range of motion. Reports that pain symptoms are more present in her calf but are also present in her knee. She has been able to ambulate with a rolling walker without falls. She voices concerns about potential underlying blood clot since her sibling died from complications associated with DVT. She expresses that she feels as though her current SNF has been doing a poor job addressing her concerns. 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 She denies abdominal pain, chest pain, or new worsening shortness of breath. She is able produce urine and has not noticed any blood or discoloration of her urine. She has not had any changes in her diet and reports hydrating well. She has been taking asprin 81 mg once a day since her surgery. She has not been taking any additional OTC medications for pain.  Objective:  Vital signs in last 24 hours: Vitals:   01/25/19 0815 01/25/19 0830 01/25/19 0845 01/25/19 0900  BP: (!) 133/54 (!) 116/39 140/73 134/90  Pulse: 75 75 82 87  Resp: (!) 23 16 (!) 23 20  Temp:      TempSrc:      SpO2: 100% 100% 100% 95%   Weight change:  No intake or output data in the 24 hours ending 01/25/19 4742   Physical Exam  Constitutional: Laying in bed in no acute distress, appears well-developed and well-nourished. Cardiovascular: Normal heart sounds, normal rate, no murmur appreciated Respiratory: Normal work of breathing, diffuse mild crackles noted in base of lungs GI: Soft. Bowel sounds are normal. No distension. There is no tenderness.  Musculoskeletal: Lower left extremity appears slightly larger than right. No pitting edema noted. Neurological: Is alert and oriented. Skin: Skin of both lower extremities are warm to touch. Left knee incision site covered by dressing. Psychiatric:  Somewhat tearful at times in conversation but judgment and thought content normal.   VAS [place] LOWER EXTREMITY VENOUS (DVT) ) 01/25/19 Summary: preliminary results Right: No evidence of common femoral vein obstruction. Left: There is no evidence of deep vein thrombosis in the lower extremity. No cystic structure found in the popliteal fossa.  CXR 01/25/19 IMPRESSION: No active cardiopulmonary disease.  Renal Ultrasound 01/25/19 IMPRESSION: 1. Increased renal parenchymal echogenicity, renal cortical thinning mild reduction in renal sizes consistent with medical renal disease. 2. No hydronephrosis.  No acute findings. 3. Renal cysts as described. No significant change from the prior ultrasound allowing for slight differences in imaging technique and visualization of renal cysts.  Assessment/Plan:  Principal Problem:   AKI (acute kidney injury) ([place]) Active Problems:   Leg swelling   QT prolongation  Left Leg Swelling Presents with acute worsening knee pain over the past week following left knee replacement 10 days prior. Her recent surgery places her at higher risk for development of DVT and requires further evaluation with ultrasound today. She is on day 3 of her Eliquis 10 mg BID and will need to continue this. Preliminary results without evidence of DVT. There is no significant discrepancy in size between the left and right leg. There is no notable erythema or fluctuance around the dressing covering the surgical incision on her knee. She is currently afebrile without leukocytosis which lessens our concern for infectious process at this time. - Awaiting final results of ultrasound - Transition from heparin drip  to home Eliquis 10 mg BID - PT/OT consult in place - Oxycodone Q6H PRN for pain  Acute on Chronic Kidney injury: Creatinine today of 2.39, elevated from 1.65 nine days ago. Likely etiology includes pre-renal from inadequate fluid intake vs Bactrim nephrotoxicity. She reports an  adequate diet with regular fluid intake. Likely, the elevation in creatinine is associated with the Bactrim she was started on for empiric treatment of cellulitis. She is aware that she cannot take certain OTCs such as ibuprofen which can affect her kidneys. - Preliminary renal ultrasound results with no significant change from prior - Encouraged continued PO fluid intake - Recheck BMP  Hypothyroidism: TSH today of 0.691 - Continue of Synthroid.  Bipolar Disorder - Stable, continue home zoloft   LOS: 0 days   Luna Fuse, Medical Student 01/25/2019, 9:38 AM

## 2019-01-25 NOTE — ED Notes (Signed)
ED TO INPATIENT HANDOFF REPORT  ED Nurse Name and Phone #: Daralene Milch, RN [phone]  S Name/Age/Gender Toni Hill 73 y.o. female Room/Bed: 014C/014C  Code Status   Code Status: Full Code  Home/SNF/Other Rehab Patient oriented to: self, place, time and situation Is this baseline? Yes   Triage Complete: Triage complete  Chief Complaint needs blood test  Triage Note Pt comes via [facility] from [facility] after having abnormal lab values but they are unsure what ones, possibly d-dimer. Pt had recent knee surgery    Allergies Allergies  Allergen Reactions  . 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 Strongly recommend against using this antibiotic in the future.   . Morphine And Related Nausea And Vomiting  . Vioxx [Rofecoxib]     GI bleeding  . Penicillins Hives and Itching    Has patient had a PCN reaction causing immediate rash, facial/tongue/throat swelling, SOB or lightheadedness with hypotension: No Has patient had a PCN reaction causing severe rash involving mucus membranes or skin necrosis: No Has patient had a PCN reaction that required hospitalization: no Has patient had a PCN reaction occurring within the last 10 years: No If all of the above answers are "NO", then may proceed with Cephalosporin use.     Level of Care/Admitting Diagnosis ED Disposition    ED Disposition Condition [facility] Area: [place] [[id number]]  Level of Care: Telemetry Medical [104]  Covid Evaluation: Asymptomatic Screening Protocol (No Symptoms)  Diagnosis: AKI (acute kidney injury) [facility]) [[id number]]  Admitting Physician: Aline Brochure  Attending Physician: Bartholomew Crews [phone]  Estimated length of stay: past midnight tomorrow  Certification:: I certify this patient will need inpatient services for at least 2 midnights  PT Class (Do Not Modify): Inpatient [101]  PT Acc Code  (Do Not Modify): Private [1]       B Medical/Surgery History Past Medical History:  Diagnosis Date  . Bilateral lower extremity edema    right > left  . Bipolar 1 disorder ([place])   . CKD (chronic kidney disease) stage 3, GFR 30-59 ml/min (HCC)     Dr Lawson Radar, [facility]  . Diabetes, polyneuropathy ([place])    FEET AND FINGER TIPS  . Fibromyalgia   . GERD (gastroesophageal reflux disease)   . History of chronic gastritis   . Hyperlipidemia   . Hypertension    per pt was take off bp medication because of kidney disease told by her nephrologist  . Hypothyroidism   . Mixed incontinence urge and stress   . OA (osteoarthritis)    KNEES  . OAB (overactive bladder)   . PONV (postoperative nausea and vomiting)   . 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 Type 2 diabetes mellitus treated with insulin ([place])   . Wears glasses    Past Surgical History:  Procedure Laterality Date  . CHOLECYSTECTOMY OPEN  1990's  . COLONOSCOPY  last one 07-17-2014  . CYSTOSCOPY WITH INJECTION N/A 10/29/2016   Procedure: CYSTOSCOPY WITH INJECTION BOTOX 100 UNITS, urethral dilation;  Surgeon: Carolan Clines, MD;  Location: [place];  Service: Urology;  Laterality: N/A;  . ESOPHAGOGASTRODUODENOSCOPY  last one 07-01-2016  . EXCISIONAL BREAST BX  1990   BENIGN  . ORIF TIBIA & FIBULA FRACTURES  2010   retained rod  . TONSILLECTOMY  age 83  . TOTAL KNEE ARTHROPLASTY Left 01/15/2019  Procedure: LEFT TOTAL KNEE ARTHROPLASTY;  Surgeon: Leandrew Koyanagi, MD;  Location: [place];  Service: Orthopedics;  Laterality: Left;  Marland Kitchen VAGINAL HYSTERECTOMY  1979  . VENTRAL HERNIA REPAIR  1990's     A IV Location/Drains/Wounds Patient Lines/Drains/Airways Status   Active Line/Drains/Airways    Name:   Placement date:   Placement time:   Site:   Days:   Peripheral IV 11/20/16 Right;Posterior Forearm   11/20/16    0918    Forearm   796   Peripheral IV 01/25/19 Right   01/25/19    0723    -    less than 1   Incision (Closed) 01/15/19 Leg Left   01/15/19    1229     10          Intake/Output Last 24 hours  Intake/Output Summary (Last 24 hours) at 01/25/2019 1631 Last data filed at 01/25/2019 1414 Gross per 24 hour  Intake -  Output 200 ml  Net -200 ml    Labs/Imaging Results for orders placed or performed during the hospital encounter of 01/24/19 (from the past 48 hour(s))  Basic metabolic panel     Status: Abnormal   Collection Time: 01/25/19  1:25 AM  Result Value Ref Range   Sodium 139 135 - 145 mmol/L   Potassium 4.8 3.5 - 5.1 mmol/L   Chloride 102 98 - 111 mmol/L   CO2 25 22 - 32 mmol/L   Glucose, Bld 171 (H) 70 - 99 mg/dL   BUN 40 (H) 8 - 23 mg/dL   Creatinine, Ser 2.39 (H) 0.44 - 1.00 mg/dL   Calcium 9.5 8.9 - 10.3 mg/dL   GFR calc non Af Amer 20 (L) >60 mL/min   GFR calc Af Amer 23 (L) >60 mL/min   Anion gap 12 5 - 15    Comment: Performed at [facility], [address] [place], [place] [address]  CBC     Status: Abnormal   Collection Time: 01/25/19  1:25 AM  Result Value Ref Range   WBC 7.2 4.0 - 10.5 K/uL   RBC 3.52 (L) 3.87 - 5.11 MIL/uL   Hemoglobin 11.5 (L) 12.0 - 15.0 g/dL   HCT 36.6 36.0 - 46.0 %   MCV 104.0 (H) 80.0 - 100.0 fL   MCH 32.7 26.0 - 34.0 pg   MCHC 31.4 30.0 - 36.0 g/dL   RDW 12.7 11.5 - 15.5 %   Platelets 406 (H) 150 - 400 K/uL   nRBC 0.0 0.0 - 0.2 %    Comment: Performed at [facility], [place] [address] [place], [place] [address]  Troponin I (High Sensitivity)     Status: None   Collection Time: 01/25/19  1:25 AM  Result Value Ref Range   Troponin I (High Sensitivity) 13 <18 ng/L    Comment: (NOTE) Elevated high sensitivity troponin I (hsTnI) values and significant  changes across serial measurements may suggest ACS but many other  chronic and acute conditions are known to elevate hsTnI results.  Refer to the "Links" section for chest pain algorithms and additional  guidance. Performed at [facility], [place] [address] [place], [place] [address]   D-dimer, quantitative (not at [facility])     Status: Abnormal   Collection Time: 01/25/19  1:25 AM  Result Value Ref Range   D-Dimer, Quant 4.77 (H) 0.00 - 0.50 ug/mL-FEU    Comment: (NOTE) At the manufacturer cut-off of 0.50 ug/mL FEU, this  assay has been documented to exclude PE with a sensitivity and negative predictive value of 97 to 99%.  At this time, this assay has not been approved by the FDA to exclude DVT/VTE. Results should be correlated with clinical presentation. Performed at [facility], [place] [address] [place], [place] [address]   Troponin I (High Sensitivity)     Status: None   Collection Time: 01/25/19  3:36 AM  Result Value Ref Range   Troponin I (High Sensitivity) 13 <18 ng/L    Comment: (NOTE) Elevated high sensitivity troponin I (hsTnI) values and significant  changes across serial measurements may suggest ACS but many other  chronic and acute conditions are known to elevate hsTnI results.  Refer to the "Links" section for chest pain algorithms and additional  guidance. Performed at [facility], [place] [address], [place], [place] [address]   C-reactive protein     Status: Abnormal   Collection Time: 01/25/19  4:56 AM  Result Value Ref Range   CRP 1.2 (H) <1.0 mg/dL    Comment: Performed at [facility] [address] [place], [place] [address]  Sedimentation rate     Status: Abnormal   Collection Time: 01/25/19  4:56 AM  Result Value Ref Range   Sed Rate 57 (H) 0 - 22 mm/hr    Comment: Performed at [facility] [address] [place], [place] [address]  CBC     Status: Abnormal   Collection Time: 01/25/19  4:56 AM  Result Value Ref Range   WBC 6.2 4.0 - 10.5 K/uL   RBC 3.18 (L) 3.87 - 5.11 MIL/uL   Hemoglobin 10.4 (L) 12.0 - 15.0 g/dL   HCT 33.1 (L) 36.0 - 46.0 %   MCV 104.1 (H) 80.0 - 100.0 fL   MCH 32.7 26.0 - 34.0 pg   MCHC 31.4 30.0 - 36.0 g/dL   RDW 12.6 11.5 - 15.5 %    Platelets 386 150 - 400 K/uL   nRBC 0.0 0.0 - 0.2 %    Comment: Performed at [facility], [place] [address] [place], [place] [address]  TSH     Status: None   Collection Time: 01/25/19  4:56 AM  Result Value Ref Range   TSH 0.691 0.350 - 4.500 uIU/mL    Comment: Performed by a 3rd Generation assay with a functional sensitivity of <=0.01 uIU/mL. Performed at [facility], [place] [address] [place], [place] [address]   SARS Coronavirus 2 (CEPHEID - Performed in [place] [facility] lab), Hosp Order     Status: None   Collection Time: 01/25/19  4:57 AM   Specimen: Nasopharyngeal Swab  Result Value Ref Range   SARS Coronavirus 2 NEGATIVE NEGATIVE    Comment: (NOTE) If result is NEGATIVE SARS-CoV-2 target nucleic acids are NOT DETECTED. The SARS-CoV-2 RNA is generally detectable in upper and lower  respiratory specimens during the acute phase of infection. The lowest  concentration of SARS-CoV-2 viral copies this assay can detect is 250  copies / mL. A negative result does not preclude SARS-CoV-2 infection  and should not be used as the sole basis for treatment or other  patient management decisions.  A negative result may occur with  improper specimen collection / handling, submission of specimen other  than nasopharyngeal swab, presence of viral mutation(s) within the  areas targeted by this assay, and inadequate number of viral copies  (<250 copies / mL). A negative  result must be combined with clinical  observations, patient history, and epidemiological information. If result is POSITIVE SARS-CoV-2 target nucleic acids are DETECTED. The SARS-CoV-2 RNA is generally detectable in upper and lower  respiratory specimens dur ing the acute phase of infection.  Positive  results are indicative of active infection with SARS-CoV-2.  Clinical  correlation with patient history and other diagnostic information is  necessary to determine patient infection status.  Positive results do   not rule out bacterial infection or co-infection with other viruses. If result is PRESUMPTIVE POSTIVE SARS-CoV-2 nucleic acids MAY BE PRESENT.   A presumptive positive result was obtained on the submitted specimen  and confirmed on repeat testing.  While 2019 novel coronavirus  (SARS-CoV-2) nucleic acids may be present in the submitted sample  additional confirmatory testing may be necessary for epidemiological  and / or clinical management purposes  to differentiate between  SARS-CoV-2 and other Sarbecovirus currently known to infect humans.  If clinically indicated additional testing with an alternate test  methodology [phone]) is advised. The SARS-CoV-2 RNA is generally  detectable in upper and lower respiratory sp ecimens during the acute  phase of infection. The expected result is Negative. Fact Sheet for Patients:  StrictlyIdeas.no Fact Sheet for Healthcare Providers: BankingDealers.co.za This test is not yet approved or cleared by the [place] FDA and has been authorized for detection and/or diagnosis of SARS-CoV-2 by FDA under an Emergency Use Authorization (EUA).  This EUA will remain in effect (meaning this test can be used) for the duration of the COVID-19 declaration under Section 564(b)(1) of the Act, 21 U.S.C. section 360bbb-3(b)(1), unless the authorization is terminated or revoked sooner. Performed at [facility], [place] [address], [place], [place] [address]   CBG monitoring, ED     Status: None   Collection Time: 01/25/19  7:47 AM  Result Value Ref Range   Glucose-Capillary 82 70 - 99 mg/dL  Urinalysis, Routine w reflex microscopic     Status: Abnormal   Collection Time: 01/25/19  9:40 AM  Result Value Ref Range   Color, Urine YELLOW YELLOW   APPearance HAZY (A) CLEAR   Specific Gravity, Urine 1.017 1.005 - 1.030   pH 5.0 5.0 - 8.0   Glucose, UA NEGATIVE NEGATIVE mg/dL   Hgb urine dipstick NEGATIVE  NEGATIVE   Bilirubin Urine NEGATIVE NEGATIVE   Ketones, ur NEGATIVE NEGATIVE mg/dL   Protein, ur NEGATIVE NEGATIVE mg/dL   Nitrite NEGATIVE NEGATIVE   Leukocytes,Ua NEGATIVE NEGATIVE    Comment: Performed at [facility] [address], [place], [place] [address]  CBG monitoring, ED     Status: Abnormal   Collection Time: 01/25/19  3:58 PM  Result Value Ref Range   Glucose-Capillary 243 (H) 70 - 99 mg/dL   Dg Chest 2 View  Result Date: 01/25/2019 CLINICAL DATA:  73 year old female with palpitation. EXAM: CHEST - 2 VIEW COMPARISON:  Chest radiograph dated 01/10/2019 FINDINGS: Mild eventration of the right hemidiaphragm. No focal consolidation, pleural effusion, or pneumothorax. Stable cardiac silhouette. No acute osseous pathology. IMPRESSION: No active cardiopulmonary disease. Electronically Signed   By: Anner Crete M.D.   On: 01/25/2019 02:10   US Renal  Result Date: 01/25/2019 CLINICAL DATA:  Acute kidney injury. Chronic kidney disease stage 3. EXAM: RENAL / URINARY TRACT ULTRASOUND COMPLETE COMPARISON:  Renal ultrasound, 11/01/2018 FINDINGS: Right Kidney: Renal measurements: 9.3 x 5.2 x 5.5 cm = volume: 140 mL. Increased parenchymal echogenicity. Two discrete cysts, lateral mid  to upper pole cortical cyst measuring 2.5 x 4.1 x 3.2 cm. Medial midpole cyst measuring 1.5 x 1.1 x 1.5 cm. There is renal cortical thinning. No stones. No hydronephrosis. Left Kidney: Renal measurements: 9.8 x 5.1 x 5.0 cm = volume: 131 mL. Increased parenchymal echogenicity. 2 adjacent pole cysts, 1.3 x 1.2 x 1.5 cm and 1.7 x 1.1 x 1.3 cm. No stones. No hydronephrosis. Diffuse renal cortical thinning. Bladder: Appears normal for degree of bladder distention. IMPRESSION: 1. Increased renal parenchymal echogenicity, renal cortical thinning mild reduction in renal sizes consistent with medical renal disease. 2. No hydronephrosis.  No acute findings. 3. Renal cysts as described. No significant change from the  prior ultrasound allowing for slight differences in imaging technique and visualization of renal cysts. Electronically Signed   By: Lajean Manes M.D.   On: 01/25/2019 11:13   Vas [place] Lower Extremity Venous (dvt) (only [facility])  Result Date: 01/25/2019  Lower Venous Study Indications: Pain, Swelling, and elevated d-dimer.  Risk Factors: Surgery status post left knee replacement on 01/15/19. Comparison Study: No prior. Performing Technologist: Oda Cogan RDMS, RVT  Examination Guidelines: A complete evaluation includes B-mode imaging, spectral Doppler, color Doppler, and power Doppler as needed of all accessible portions of each vessel. Bilateral testing is considered an integral part of a complete examination. Limited examinations for reoccurring indications may be performed as noted.  +-----+---------------+---------+-----------+----------+-------+ RIGHTCompressibilityPhasicitySpontaneityPropertiesSummary +-----+---------------+---------+-----------+----------+-------+ CFV  Full           Yes      Yes                          +-----+---------------+---------+-----------+----------+-------+ SFJ  Full                                                 +-----+---------------+---------+-----------+----------+-------+   +---------+---------------+---------+-----------+----------+-------+ LEFT     CompressibilityPhasicitySpontaneityPropertiesSummary +---------+---------------+---------+-----------+----------+-------+ CFV      Full           Yes      Yes                          +---------+---------------+---------+-----------+----------+-------+ SFJ      Full                                                 +---------+---------------+---------+-----------+----------+-------+ FV Prox  Full                                                 +---------+---------------+---------+-----------+----------+-------+ FV Mid   Full                                                  +---------+---------------+---------+-----------+----------+-------+ FV DistalFull                                                 +---------+---------------+---------+-----------+----------+-------+  PFV      Full                                                 +---------+---------------+---------+-----------+----------+-------+ POP      Full           Yes      Yes                          +---------+---------------+---------+-----------+----------+-------+ PTV      Full                                                 +---------+---------------+---------+-----------+----------+-------+ PERO     Full                                                 +---------+---------------+---------+-----------+----------+-------+    Summary: Right: No evidence of common femoral vein obstruction. Left: There is no evidence of deep vein thrombosis in the lower extremity. No cystic structure found in the popliteal fossa.  *See table(s) above for measurements and observations.    Preliminary     Pending Labs Unresulted Labs (From admission, onward)    Start     Ordered   01/26/19 9390  Basic metabolic panel  Tomorrow morning,   R     01/25/19 0604   01/26/19 0500  CBC  Tomorrow morning,   R     01/25/19 0857          Vitals/Pain Today's Vitals   01/25/19 1200 01/25/19 1330 01/25/19 1351 01/25/19 1628  BP:   (!) 114/39   Pulse: 66 77 70   Resp: 13 17 16    Temp:      TempSrc:      SpO2: (!) 89% 98% 97%   PainSc:    8     Isolation Precautions No active isolations  Medications Medications  oxyCODONE-acetaminophen (PERCOCET/ROXICET) 5-325 MG per tablet 1-2 tablet (2 tablets Oral Given 01/25/19 1111)  simvastatin (ZOCOR) tablet 40 mg (has no administration in time range)  sertraline (ZOLOFT) tablet 100 mg (100 mg Oral Given 01/25/19 0909)  levothyroxine (SYNTHROID) tablet 125 mcg (125 mcg Oral Given 01/25/19 0751)  bisacodyl (DULCOLAX) suppository 10 mg (has no  administration in time range)  gabapentin (NEURONTIN) capsule 100 mg (100 mg Oral Given 01/25/19 0909)  acetaminophen (TYLENOL) tablet 650 mg (has no administration in time range)    Or  acetaminophen (TYLENOL) suppository 650 mg (has no administration in time range)  senna (SENOKOT) tablet 8.6 mg (8.6 mg Oral Not Given 01/25/19 0910)  polyethylene glycol (MIRALAX / GLYCOLAX) packet 17 g (has no administration in time range)  insulin glargine (LANTUS) injection 15 Units (has no administration in time range)  insulin aspart (novoLOG) injection 0-15 Units (0 Units Subcutaneous Not Given 01/25/19 0752)  apixaban (ELIQUIS) tablet 10 mg (10 mg Oral Given 01/25/19 0909)  apixaban (ELIQUIS) tablet 5 mg (has no administration in time range)  cefTRIAXone (ROCEPHIN) 1 g in sodium chloride 0.9 % 100 mL IVPB (0 g Intravenous Stopped 01/25/19 0541)  sodium chloride 0.9 % bolus 1,000 mL (1,000 mLs Intravenous New Bag/Given 01/25/19 0502)  HYDROcodone-acetaminophen (NORCO/VICODIN) 5-325 MG per tablet 1 tablet (1 tablet Oral Given 01/25/19 0500)    Mobility walks with device High fall risk   Focused Assessments Pt from [facility].  Pt alert and oriented x's 3   R Recommendations: See Admitting Provider Note  Report given to:   Additional Notes:

## 2019-01-25 NOTE — Progress Notes (Signed)
Left lower ext venous study  has been completed. Refer to [facility] under chart review to view preliminary results. Results given to patient's nurse.   01/25/2019  8:45 AM Sneijder Bernards, Bonnye Fava

## 2019-01-25 NOTE — ED Notes (Signed)
Got patient up to the bedside toilet patient is now back in bed on the monitor I bladder scan patient also

## 2019-01-26 DIAGNOSIS — G629 Polyneuropathy, unspecified: Secondary | ICD-10-CM

## 2019-01-26 DIAGNOSIS — M79662 Pain in left lower leg: Secondary | ICD-10-CM

## 2019-01-26 DIAGNOSIS — N184 Chronic kidney disease, stage 4 (severe): Secondary | ICD-10-CM

## 2019-01-26 DIAGNOSIS — M7989 Other specified soft tissue disorders: Secondary | ICD-10-CM

## 2019-01-26 LAB — CBC
HCT: 33.9 % — ABNORMAL LOW (ref 36.0–46.0)
Hemoglobin: 10.7 g/dL — ABNORMAL LOW (ref 12.0–15.0)
MCH: 32.4 pg (ref 26.0–34.0)
MCHC: 31.6 g/dL (ref 30.0–36.0)
MCV: 102.7 fL — ABNORMAL HIGH (ref 80.0–100.0)
Platelets: 387 10*3/uL (ref 150–400)
RBC: 3.3 MIL/uL — ABNORMAL LOW (ref 3.87–5.11)
RDW: 12.6 % (ref 11.5–15.5)
WBC: 5.1 10*3/uL (ref 4.0–10.5)
nRBC: 0 % (ref 0.0–0.2)

## 2019-01-26 LAB — LACTIC ACID, PLASMA: Lactic Acid, Venous: 0.7 mmol/L (ref 0.5–1.9)

## 2019-01-26 LAB — MAGNESIUM: Magnesium: 2 mg/dL (ref 1.7–2.4)

## 2019-01-26 LAB — GLUCOSE, CAPILLARY
Glucose-Capillary: 200 mg/dL — ABNORMAL HIGH (ref 70–99)
Glucose-Capillary: 157 mg/dL — ABNORMAL HIGH (ref 70–99)
Glucose-Capillary: 183 mg/dL — ABNORMAL HIGH (ref 70–99)
Glucose-Capillary: 199 mg/dL — ABNORMAL HIGH (ref 70–99)

## 2019-01-26 LAB — BASIC METABOLIC PANEL
Anion gap: 9 (ref 5–15)
BUN: 38 mg/dL — ABNORMAL HIGH (ref 8–23)
CO2: 24 mmol/L (ref 22–32)
Calcium: 9.1 mg/dL (ref 8.9–10.3)
Chloride: 106 mmol/L (ref 98–111)
Creatinine, Ser: 1.79 mg/dL — ABNORMAL HIGH (ref 0.44–1.00)
GFR calc Af Amer: 32 mL/min — ABNORMAL LOW (ref >60)
GFR calc non Af Amer: 28 mL/min — ABNORMAL LOW (ref >60)
Glucose, Bld: 173 mg/dL — ABNORMAL HIGH (ref 70–99)
Potassium: 5 mmol/L (ref 3.5–5.1)
Sodium: 139 mmol/L (ref 135–145)

## 2019-01-26 MED ORDER — SODIUM CHLORIDE 0.9 % IV BOLUS
1000.0000 mL | Freq: Once | INTRAVENOUS | Status: AC
  Administered 2019-01-26: 1000 mL via INTRAVENOUS

## 2019-01-26 MED ORDER — ACETAMINOPHEN 325 MG PO TABS
650.0000 mg | ORAL_TABLET | Freq: Four times a day (QID) | ORAL | Status: DC
  Administered 2019-01-26 – 2019-01-31 (×19): 650 mg via ORAL
  Filled 2019-01-26 (×20): qty 2

## 2019-01-26 MED ORDER — APIXABAN 2.5 MG PO TABS
2.5000 mg | ORAL_TABLET | Freq: Two times a day (BID) | ORAL | Status: DC
  Administered 2019-01-26 – 2019-01-31 (×11): 2.5 mg via ORAL
  Filled 2019-01-26 (×11): qty 1

## 2019-01-26 NOTE — Progress Notes (Signed)
Manual BP 120/40. Welford Roche, MD paged. Awaiting MD response. Will continue to monitor.

## 2019-01-26 NOTE — Plan of Care (Signed)

## 2019-01-26 NOTE — TOC Initial Note (Signed)
Transition of Care [facility]) - Initial/Assessment Note    Patient Details  Name: Toni Hill MRN: [id number] Date of Birth: 1945/07/16  Transition of Care [facility]) CM/SW Contact:    Alberteen Sam, LCSW Phone Number: 01/26/2019, 10:49 AM  Clinical Narrative:                  CSW consulted with patient's daughter Lenna Sciara as she had called a week ago regarding issues at [facility]. Melissa reports being hopeful that patient can go to inpatient rehab at [facility] for CIR.   Melissa reports if patient is declined for CIR, she would like patient to go to a different facility and agrees to referrals being faxed out.   CSW will continue to follow for discharge planning needs.   Expected Discharge Plan: (TBD) Barriers to Discharge: Continued Medical Work up   Patient Goals and CMS Choice   CMS Medicare.gov Compare Post Acute Care list provided to:: Patient Represenative (must comment)(Melissa (daughter)) Choice offered to / list presented to : Adult Children(Melissa)  Expected Discharge Plan and Services Expected Discharge Plan: (TBD)     Post Acute Care Choice: (daughter prefers inpatient rehab) Living arrangements for the past 2 months: Skilled Nursing Facility(recently admitted to [facility])                                      Prior Living Arrangements/Services Living arrangements for the past 2 months: Skilled Nursing Facility(recently admitted to [facility]) Lives with:: Self Patient language and need for interpreter reviewed:: Yes Do you feel safe going back to the place where you live?: Yes      Need for Family Participation in Patient Care: Yes (Comment) Care giver support system in place?: Yes (comment)   Criminal Activity/Legal Involvement Pertinent to Current Situation/Hospitalization: No - Comment as needed  Activities of Daily Living Home Assistive Devices/Equipment: Eyeglasses, CBG Meter, Other (Comment) ADL Screening (condition at  time of admission) Patient's cognitive ability adequate to safely complete daily activities?: Yes Is the patient deaf or have difficulty hearing?: No Does the patient have difficulty seeing, even when wearing glasses/contacts?: Yes Does the patient have difficulty concentrating, remembering, or making decisions?: No Patient able to express need for assistance with ADLs?: Yes Does the patient have difficulty dressing or bathing?: Yes Independently performs ADLs?: No Does the patient have difficulty walking or climbing stairs?: Yes Weakness of Legs: Left Weakness of Arms/Hands: None  Permission Sought/Granted Permission sought to share information with : Case Manager, Customer service manager, Family Supports Permission granted to share information with : Yes, Verbal Permission Granted  Share Information with NAME: Melissa  Permission granted to share info w AGENCY: SNFs  Permission granted to share info w Relationship: daughter  Permission granted to share info w Contact Information: [phone]  Emotional Assessment Appearance:: Appears stated age Attitude/Demeanor/Rapport: Gracious Affect (typically observed): Calm Orientation: : Oriented to Self, Oriented to Place, Oriented to  Time, Oriented to Situation Alcohol / Substance Use: Not Applicable Psych Involvement: No (comment)  Admission diagnosis:  Cellulitis of left lower extremity [L03.116] AKI (acute kidney injury) ([place]) [N17.9] Patient Active Problem List   Diagnosis Date Noted  . Pain of left calf 01/26/2019  . AKI (acute kidney injury) ([place]) 01/25/2019  . Leg swelling 01/25/2019  . QT prolongation 01/25/2019  . Elevated d-dimer 01/23/2019  . Acute blood loss as cause of postoperative anemia  01/23/2019  . Cellulitis of leg, left 01/23/2019  . At risk for adverse drug event 01/22/2019  . Bipolar 1 disorder ([place])   . 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 Anxiety 12/30/2018  . Stage 3 chronic  kidney disease ([place]) 06/28/2016  . Hypercholesterolemia 08/20/2015  . Type 2 diabetes mellitus ([place]) 02/28/2014  . Esophageal reflux 02/28/2014  . Stress incontinence, female 02/28/2014  . Acquired hypothyroidism 02/28/2014  . Essential hypertension 11/17/2011   PCP:  Maudie Mercury, MD Pharmacy:  No Pharmacies Listed    Social Determinants of Health (SDOH) Interventions    Readmission Risk Interventions No flowsheet data found.

## 2019-01-26 NOTE — Evaluation (Signed)
Physical Therapy Evaluation Patient Details Name: Toni Hill MRN: [id number] DOB: 12-06-1945 Today's Date: 01/26/2019   History of Present Illness  Pt is a 73 y/o female who was admitted to hospital with AKI. Pnt recently hospitalized for s/p L TKA. PMH includes bipolar disorder, HTN, DM, CKD, and fibromyalgia.   Clinical Impression  Pt was assessed and note her pain and contracture on LLE are making her unable to WB on LLE and take steps.  Follow up with PT to work on strength, ROM and work on endurance/balance for gait and will recommend further inpt care with pt asking to be considered for CIR.      Follow Up Recommendations CIR    Equipment Recommendations  None recommended by PT    Recommendations for Other Services Rehab consult     Precautions / Restrictions Precautions Precautions: Fall;Knee(Low BP) Precaution Booklet Issued: No Precaution Comments: previous admit for TKA Restrictions Weight Bearing Restrictions: Yes LLE Weight Bearing: Weight bearing as tolerated      Mobility  Bed Mobility Overal bed mobility: Needs Assistance Bed Mobility: Supine to Sit;Sit to Supine     Supine to sit: Mod assist Sit to supine: Mod assist      Transfers Overall transfer level: Needs assistance Equipment used: Rolling walker (2 wheeled);1 person hand held assist Transfers: Sit to/from Stand Sit to Stand: Mod assist;+2 physical assistance;+2 safety/equipment;From elevated surface         General transfer comment: reminded hand placement and avoided getting to chair as pt is not feeling up to this  Ambulation/Gait             General Gait Details: declined  Stairs            Wheelchair Mobility    Modified Rankin (Stroke Patients Only)       Balance   Sitting-balance support: Feet supported Sitting balance-Leahy Scale: Good     Standing balance support: Bilateral upper extremity supported;During functional activity Standing balance-Leahy  Scale: Poor                               Pertinent Vitals/Pain Pain Assessment: 0-10 Pain Score: 10-Worst pain ever Pain Location: L knee  Pain Descriptors / Indicators: Operative site guarding;Aching Pain Intervention(s): Limited activity within patient's tolerance;Monitored during session;Premedicated before session;Repositioned    Home Living Family/patient expects to be discharged to:: Inpatient rehab Living Arrangements: Spouse/significant other                    Prior Function Level of Independence: Independent with assistive device(s)         Comments: history of falls     Hand Dominance   Dominant Hand: Left    Extremity/Trunk Assessment   Upper Extremity Assessment Upper Extremity Assessment: Defer to OT evaluation    Lower Extremity Assessment Lower Extremity Assessment: LLE deficits/detail LLE Deficits / Details: has contracture of knee flexion of -37 deg from neutral ext LLE: Unable to fully assess due to pain LLE Coordination: decreased fine motor;decreased gross motor    Cervical / Trunk Assessment Cervical / Trunk Assessment: Normal  Communication   Communication: No difficulties  Cognition Arousal/Alertness: Awake/alert Behavior During Therapy: Anxious Overall Cognitive Status: Within Functional Limits for tasks assessed  General Comments General comments (skin integrity, edema, etc.): pt was unable to WB on LLE, down to TDWB with RW when PT and OT stood her    Exercises     Assessment/Plan    PT Assessment Patient needs continued PT services  PT Problem List Decreased strength;Decreased range of motion;Decreased activity tolerance;Decreased balance;Decreased mobility;Decreased coordination;Decreased skin integrity;Pain       PT Treatment Interventions DME instruction;Gait training;Stair training;Functional mobility training;Therapeutic activities;Therapeutic  exercise;Balance training;Neuromuscular re-education;Patient/family education    PT Goals (Current goals can be found in the Care Plan section)  Acute Rehab PT Goals Patient Stated Goal: CIR PT Goal Formulation: With patient Time For Goal Achievement: 02/09/19 Potential to Achieve Goals: Good    Frequency Min 3X/week   Barriers to discharge Inaccessible home environment;Decreased caregiver support husband has a degenerative neuro condition and cannot help her    Co-evaluation               AM-PAC PT "6 Clicks" Mobility  Outcome Measure Help needed turning from your back to your side while in a flat bed without using bedrails?: A Little Help needed moving from lying on your back to sitting on the side of a flat bed without using bedrails?: A Little Help needed moving to and from a bed to a chair (including a wheelchair)?: A Little Help needed standing up from a chair using your arms (e.g., wheelchair or bedside chair)?: A Lot Help needed to walk in hospital room?: A Lot Help needed climbing 3-5 steps with a railing? : Total 6 Click Score: 14    End of Session Equipment Utilized During Treatment: Gait belt Activity Tolerance: Patient limited by pain;Treatment limited secondary to medical complications (Comment) Patient left: in bed;with call bell/phone within reach;with bed alarm set Nurse Communication: Mobility status;Other (comment)(provided BP readings to nursing) PT Visit Diagnosis: Unsteadiness on feet (R26.81);History of falling (Z91.81);Repeated falls (R29.6);Pain;Muscle weakness (generalized) (M62.81) Pain - Right/Left: Left Pain - part of body: Knee    Time: 5035-4656 PT Time Calculation (min) (ACUTE ONLY): 39 min   Charges:   PT Evaluation $PT Eval Moderate Complexity: 1 Mod PT Treatments $Gait Training: 8-22 mins $Therapeutic Activity: 8-22 mins       Ramond Dial 01/26/2019, 1:47 PM   Mee Hives, PT MS Acute Rehab Dept. Number: [place] and  [place]

## 2019-01-26 NOTE — Evaluation (Signed)
Occupational Therapy Evaluation Patient Details Name: Toni Hill MRN: [id number] DOB: 1946/01/14 Today's Date: 01/26/2019    History of Present Illness Pt is a 73 y/o female who was admitted to hospital with AKI. Pnt recently hospitalized for s/p L TKA. PMH includes bipolar disorder, HTN, DM, CKD, and fibromyalgia.    Clinical Impression   Pt. Was at [facility] for rehab for knee. Pt. Does not want to go back and wants CIR for rehab. Pt. Is having decreased blood pressure and had to be checked multiple times during session. Pt. Has decreased ability with bed mobility, ADLs and sit to stand.     Follow Up Recommendations  CIR    Equipment Recommendations       Recommendations for Other Services Rehab consult     Precautions / Restrictions Precautions Precautions: Fall;Knee(Low BP) Precaution Booklet Issued: No Precaution Comments: previous admit for TKA Required Braces or Orthoses: (CHECK BLOOD PRESSURE IT IS RUNNING LOW) Restrictions Weight Bearing Restrictions: Yes LLE Weight Bearing: Weight bearing as tolerated      Mobility Bed Mobility Overal bed mobility: Needs Assistance Bed Mobility: Supine to Sit;Sit to Supine     Supine to sit: Mod assist Sit to supine: Mod assist      Transfers Overall transfer level: Needs assistance Equipment used: Rolling walker (2 wheeled);1 person hand held assist Transfers: Sit to/from Stand Sit to Stand: Mod assist;+2 physical assistance;+2 safety/equipment;From elevated surface         General transfer comment: reminded hand placement and avoided getting to chair as pt is not feeling up to this    Balance     Sitting balance-Leahy Scale: Good       Standing balance-Leahy Scale: Poor                             ADL either performed or assessed with clinical judgement   ADL Overall ADL's : Needs assistance/impaired Eating/Feeding: Cueing for compensatory techinques   Grooming: Supervision/safety;Set  up;Sitting   Upper Body Bathing: Supervision/ safety;Set up   Lower Body Bathing: Moderate assistance;Sit to/from stand   Upper Body Dressing : Minimal assistance   Lower Body Dressing: Moderate assistance;Sit to/from stand               Functional mobility during ADLs: Moderate assistance General ADL Comments: PNT IS REQUIRED MOD A WITH LE ADL AND MIIN A TO DON GOWN SECONDAR;Y TO DECREASED ROM OF L SHLD. PNT HAS DECREASED BP AND NEED TO MONITEER.      Vision Baseline Vision/History: Wears glasses Wears Glasses: At all times Vision Assessment?: No apparent visual deficits     Perception     Praxis      Pertinent Vitals/Pain Pain Assessment: 0-10 Pain Score: 10-Worst pain ever Pain Location: L knee  Pain Descriptors / Indicators: Operative site guarding;Aching Pain Intervention(s): Limited activity within patient's tolerance;Monitored during session;Premedicated before session;Repositioned     Hand Dominance Left   Extremity/Trunk Assessment Upper Extremity Assessment Upper Extremity Assessment: LUE deficits/detail LUE Deficits / Details: pnt reports shlld fxin nov 2019. arom shld flex 20 and aarom 30 degrees flex and abd.            Communication Communication Communication: No difficulties   Cognition Arousal/Alertness: Awake/alert Behavior During Therapy: Anxious Overall Cognitive Status: Within Functional Limits for tasks assessed  General Comments  pt was unable to WB on LLE, down to TDWB with RW when PT and OT stood her    Exercises Exercises: Other exercises   Shoulder Instructions      Home Living Family/patient expects to be discharged to:: Skilled nursing facility Living Arrangements: Spouse/significant other                                      Prior Functioning/Environment Level of Independence: Independent        Comments: Pt reports she was independent, however, had  multiple falls        OT Problem List:        OT Treatment/Interventions: Self-care/ADL training;DME and/or AE instruction;Patient/family education;Balance training;Therapeutic activities    OT Goals(Current goals can be found in the care plan section) Acute Rehab OT Goals Patient Stated Goal: CIR OT Goal Formulation: With patient Time For Goal Achievement: 02/09/19 Potential to Achieve Goals: Good  OT Frequency: Min 2X/week   Barriers to D/C: Decreased caregiver support          Co-evaluation              AM-PAC OT "6 Clicks" Daily Activity     Outcome Measure Help from another person eating meals?: None Help from another person taking care of personal grooming?: A Little Help from another person toileting, which includes using toliet, bedpan, or urinal?: Total Help from another person bathing (including washing, rinsing, drying)?: A Lot Help from another person to put on and taking off regular upper body clothing?: A Little Help from another person to put on and taking off regular lower body clothing?: A Lot 6 Click Score: 15   End of Session Equipment Utilized During Treatment: Gait belt;Rolling walker Nurse Communication: (notified of blood pressure issues)  Activity Tolerance: Other (comment)(limited by decreased blood pressure) Patient left: in bed;with call bell/phone within reach;with bed alarm set  OT Visit Diagnosis: Unsteadiness on feet (R26.81);Other abnormalities of gait and mobility (R26.89);Pain                Time: 4496-7591 OT Time Calculation (min): 38 min Charges:  OT General Charges $OT Visit: 1 Visit OT Evaluation $OT Eval Low Complexity: 1 Low OT Treatments $Self Care/Home Management : 6-38 mins  6 clicks  Kasir Hallenbeck 01/26/2019, 12:45 PM

## 2019-01-26 NOTE — Progress Notes (Signed)
Patient ID: Toni Hill, female   DOB: [birth year], 73 y.o.   MRN: [id number]  Toni Hill complains of moderate pain to the left knee.  She has not been elevating or applying ice.  There is an aquacel in place without any drainage.  Limited ROM of the knee secondary to pain.  I have ordered a CPM for the left knee which she needs to use 4-6 hours/day.  Please apply ice to knee and do not put pillows under the knee.  She will follow up with [place] when she is two weeks out from surgery.

## 2019-01-26 NOTE — Progress Notes (Signed)
BP 108/39, MAP 57. Pt asymptomatic. First contact, Karma Greaser, with Internal Medicine paged. Will recheck BP and continue to monitor.

## 2019-01-26 NOTE — Progress Notes (Signed)
BP 105/34. MAP 53. Pt states she feels dizzy. No other complaints. Welford Roche, MD aware. New orders to follow. Will continue to monitor.

## 2019-01-26 NOTE — Discharge Summary (Addendum)
Name: Toni Hill MRN: [id number] DOB: April 21, 1946 73 y.o. PCP: Maudie Mercury, MD  Date of Admission: 01/24/2019  8:09 PM Date of Discharge: 01/30/2019 Attending Physician: Bartholomew Crews, MD  Discharge Diagnosis: 1. AKI 2. Left calf pain   Discharge Medications: Allergies as of 01/30/2019      Reactions   Bactrim [sulfamethoxazole-trimethoprim]    Patient experienced nephrotoxicity in the setting of CKD. Strongly recommend against using this antibiotic in the future.    Morphine And Related Nausea And Vomiting   Vioxx [rofecoxib]    GI bleeding   Penicillins Hives, Itching   Has patient had a PCN reaction causing immediate rash, facial/tongue/throat swelling, SOB or lightheadedness with hypotension: No Has patient had a PCN reaction causing severe rash involving mucus membranes or skin necrosis: No Has patient had a PCN reaction that required hospitalization: no Has patient had a PCN reaction occurring within the last 10 years: No If all of the above answers are "NO", then may proceed with Cephalosporin use.      Medication List    STOP taking these medications   aspirin EC 81 MG tablet   doxycycline 100 MG tablet Commonly known as: VIBRA-TABS   methocarbamol 750 MG tablet Commonly known as: ROBAXIN   sulfamethoxazole-trimethoprim 800-160 MG tablet Commonly known as: BACTRIM DS     TAKE these medications   apixaban 2.5 MG Tabs tablet Commonly known as: ELIQUIS Take 1 tablet (2.5 mg total) by mouth 2 (two) times daily. What changed:   medication strength  how much to take   Basaglar KwikPen 100 UNIT/ML Sopn Inject 25 Units into the skin daily.   bisacodyl 10 MG suppository Commonly known as: DULCOLAX Place 10 mg rectally as needed for moderate constipation.   Calcium 600/Vitamin D3 600-800 MG-UNIT Tabs Generic drug: Calcium Carb-Cholecalciferol Take 1 tablet by mouth daily.   EQL Vitamin D3 50 MCG (2000 UT) Caps Generic drug: Cholecalciferol  Take 2,000 Units by mouth daily.   ezetimibe 10 MG tablet Commonly known as: ZETIA Take 10 mg by mouth every evening.   gabapentin 100 MG capsule Commonly known as: NEURONTIN Take 1 capsule (100 mg total) by mouth 2 (two) times daily.   hydrocortisone 2.5 % cream Apply 1 application topically 2 (two) times daily as needed (dry skin).   insulin aspart 100 UNIT/ML injection Commonly known as: novoLOG Inject 0-9 Units into the skin 3 (three) times daily with meals. Less than 100= 0 units 101-150= 5 units 151-200= 7 units Greater than 200= 9 units   levothyroxine 125 MCG tablet Commonly known as: SYNTHROID Take 125 mcg by mouth daily before breakfast.   lisinopril 10 MG tablet Commonly known as: ZESTRIL Take 10 mg by mouth daily.   ondansetron 4 MG tablet Commonly known as: ZOFRAN Take 4 mg by mouth every 8 (eight) hours as needed for nausea or vomiting.   oxyCODONE-acetaminophen 5-325 MG tablet Commonly known as: PERCOCET/ROXICET Take 1 tablet by mouth every 8 (eight) hours as needed for severe pain.   RA Saline Enema 19-7 GM/118ML Enem Place 1 each rectally as needed (for constipation).   senna-docusate 8.6-50 MG tablet Commonly known as: Senokot-S Take 2 tablets by mouth at bedtime as needed for mild constipation.   sertraline 100 MG tablet Commonly known as: ZOLOFT Take 100 mg by mouth daily.   simvastatin 40 MG tablet Commonly known as: ZOCOR Take 40 mg by mouth every evening.       Disposition and follow-up:  [name] was discharged from [facility] in Stable condition.  At the hospital follow up visit please address:  1.  Please assess for ongoing knee pain as well as any signs of bleeding given that she was discharged home low dose Eliquis for VTE prophylaxis.  Please avoid any nephrotoxic agents in the future.  Please limit use of opiates for pain control as patient becomes hypertensive with small oral doses.  Please ensure  patient has follow-up with orthopedic surgery in 4 weeks.  2.  Labs / imaging needed at time of follow-up: BMP to check renal function   3.  Pending labs/ test needing follow-up: None   Follow-up Appointments:  Contact information for follow-up providers    Leandrew Koyanagi, MD. Schedule an appointment as soon as possible for a visit in 4 week(s).   Specialty: Orthopedic Surgery Contact information: [address] [place] [id number] [phone]            Contact information for after-discharge care    Destination    [facility] .   Service: Skilled Nursing Contact information: [address] [place] [phone]                 1. Please call [facility] at [phone] to schedule a follow-up appointment after she is discharged from [place] by problem list:  1. Left Leg Pain  [name] presented with acute L calf pain initially concerning for a VTE given elevated D-dimer to 4.7.  However her vascular US was negative and her pain was attributed to post surgical pain given recent total knee replacement 10 days prior to presentation.  She was started on Eliquis 2.5 mg twice daily for VTE prophylaxis.  There is no standard length of treatment for VTE ppx inTKR surgery, the recommended range is 10-35 days. She is currently on Day 8, we recommend a longer duration of treatment such as 30 days total given her poor mobility at this time. Therefore last dose would be 02/21/2019, could extent for 5 more days if needed. She was evaluated by PT who recommended SNF. Will need follow up with orthopedic surgery in 4 weeks.   2. Acute on Chronic Kidney injury [name] has a history of CKD 3 with a baseline creatinine of 1.3-1.4.  She presented with the creatinine of 2.4 which was thought to be secondary to Bactrim nephrotoxicity to treat a left lower extremity cellulitis.  Antibiotics were stopped due to no signs  or symptoms of infection and her renal function improved back to baseline. Discharged Cr 1.3.    Discharge Vitals:   BP (!) 156/68 (BP Location: Left Arm)   Pulse 81   Temp 98.1 F (36.7 C) (Oral)   Resp 16   Ht 5\' 9"  (1.753 m)   Wt 103.6 kg   SpO2 95%   BMI 33.73 kg/m   Pertinent Labs, Studies, and Procedures:  CBC Latest Ref Rng & Units 01/28/2019 01/26/2019 01/25/2019  WBC 4.0 - 10.5 K/uL 6.5 5.1 6.2  Hemoglobin 12.0 - 15.0 g/dL 10.9(L) 10.7(L) 10.4(L)  Hematocrit 36.0 - 46.0 % 34.2(L) 33.9(L) 33.1(L)  Platelets 150 - 400 K/uL 416(H) 387 386   BMP Latest Ref Rng & Units 01/29/2019 01/28/2019 01/27/2019  Glucose 70 - 99 mg/dL 254(H) 193(H) 196(H)  BUN 8 - 23 mg/dL 35(H) 31(H) 36(H)  Creatinine 0.44 - 1.00 mg/dL 1.35(H) 1.27(H) 1.42(H)  BUN/Creat Ratio 12 -  28 - - -  Sodium 135 - 145 mmol/L 139 139 139  Potassium 3.5 - 5.1 mmol/L 4.0 4.0 4.4  Chloride 98 - 111 mmol/L 107 107 107  CO2 22 - 32 mmol/L 24 24 25   Calcium 8.9 - 10.3 mg/dL 9.3 9.3 9.2   Renal U/S 01/25/19 IMPRESSION: 1. Increased renal parenchymal echogenicity, renal cortical thinning mild reduction in renal sizes consistent with medical renal disease. 2. No hydronephrosis.  No acute findings. 3. Renal cysts as described. No significant change from the prior ultrasound allowing for slight differences in imaging technique and visualization of renal cysts.  VAS [place] LOWER EXTREMITY VENOUS (DVT) 01/25/19 Summary: Right: No evidence of common femoral vein obstruction. Left: There is no evidence of deep vein thrombosis in the lower extremity. No cystic structure found in the popliteal fossa.   Discharge Instructions: Discharge Instructions    Call MD for:  redness, tenderness, or signs of infection (pain, swelling, redness, odor or green/yellow discharge around incision site)   Complete by: As directed    Call MD for:  severe uncontrolled pain   Complete by: As directed    Call MD for:  temperature >100.4   Complete  by: As directed    Diet - low sodium heart healthy   Complete by: As directed    Discharge instructions   Complete by: As directed    [name],  You do not have a clot in your leg.  This is very good news.  However, you will need to be on a blood thinner for the next 30ish days to prevent a clot from happening.   You will take 1 tablet twice a day for the next 10 days.  Your kidneys are back to how they usually are.   Ask your facility to schedule you an appointment with [place] after you are discharged. We will check your kidney function at that visit.  Please call us at [phone] to you have any questions or concerns.  - Dr. Frederico Hamman   Increase activity slowly   Complete by: As directed       Signed: Welford Roche, MD 01/30/2019, 1:29 PM

## 2019-01-26 NOTE — Discharge Instructions (Signed)
-   Follow up appointment in internal medicine clinic on 02/01/19 at 10:15AM. - We adjusted your Eliquis to 2.5 mg twice a day, you will stay on this medication for another 10 days with the last day being August 3rd.   =========================================================================================  Information on my medicine - ELIQUIS (apixaban)  This medication education was reviewed with me or my healthcare representative as part of my discharge preparation.  The pharmacist that spoke with me during my hospital stay was:  Maryan Char Serenity Batley, RPH  Why was Eliquis prescribed for you? Eliquis was prescribed for you to reduce the risk of blood clots forming after orthopedic surgery.    What do You need to know about Eliquis? Take your Eliquis TWICE DAILY - one tablet in the morning and one tablet in the evening with or without food.  It would be best to take the dose about the same time each day.  If you have difficulty swallowing the tablet whole please discuss with your pharmacist how to take the medication safely.  Take Eliquis exactly as prescribed by your doctor and DO NOT stop taking Eliquis without talking to the doctor who prescribed the medication.  Stopping without other medication to take the place of Eliquis may increase your risk of developing a clot.  After discharge, you should have regular check-up appointments with your healthcare provider that is prescribing your Eliquis.  What do you do if you miss a dose? If a dose of ELIQUIS is not taken at the scheduled time, take it as soon as possible on the same day and twice-daily administration should be resumed.  The dose should not be doubled to make up for a missed dose.  Do not take more than one tablet of ELIQUIS at the same time.  Important Safety Information A possible side effect of Eliquis is bleeding. You should call your healthcare provider right away if you experience any of the following: ? Bleeding  from an injury or your nose that does not stop. ? Unusual colored urine (red or dark brown) or unusual colored stools (red or black). ? Unusual bruising for unknown reasons. ? A serious fall or if you hit your head (even if there is no bleeding).  Some medicines may interact with Eliquis and might increase your risk of bleeding or clotting while on Eliquis. To help avoid this, consult your healthcare provider or pharmacist prior to using any new prescription or non-prescription medications, including herbals, vitamins, non-steroidal anti-inflammatory drugs (NSAIDs) and supplements.  This website has more information on Eliquis (apixaban): http://www.eliquis.com/eliquis/home

## 2019-01-26 NOTE — Progress Notes (Signed)
RN called and updated pt's daughter, Mikey Kirschner, on pt status and plan of care. All questions answered to satisfaction. Will continue to monitor.

## 2019-01-26 NOTE — NC FL2 (Signed)
[place] LEVEL OF CARE SCREENING TOOL     IDENTIFICATION  Patient Name: Toni Hill Birthdate: January 02, 1946 Sex: female Admission Date (Current Location): 01/24/2019  [facility] and [place] Number:  Herbalist and Address:  [facility], [place] [address] [place], [place] [address]      Provider Number: [id number]  Attending Physician Name and Address:  Bartholomew Crews, MD  Relative Name and Phone Number:  Brya Simerly, spouse, [phone]    Current Level of Care: Hospital Recommended Level of Care: [place] Prior Approval Number:    Date Approved/Denied:   PASRR Number: [id number]  Discharge Plan: SNF    Current Diagnoses: Patient Active Problem List   Diagnosis Date Noted  . Pain of left calf 01/26/2019  . AKI (acute kidney injury) ([place]) 01/25/2019  . Leg swelling 01/25/2019  . QT prolongation 01/25/2019  . Elevated d-dimer 01/23/2019  . Acute blood loss as cause of postoperative anemia 01/23/2019  . Cellulitis of leg, left 01/23/2019  . At risk for adverse drug event 01/22/2019  . Bipolar 1 disorder ([place])   . 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 Anxiety 12/30/2018  . Stage 3 chronic kidney disease ([place]) 06/28/2016  . Hypercholesterolemia 08/20/2015  . Type 2 diabetes mellitus ([place]) 02/28/2014  . Esophageal reflux 02/28/2014  . Stress incontinence, female 02/28/2014  . Acquired hypothyroidism 02/28/2014  . Essential hypertension 11/17/2011    Orientation RESPIRATION BLADDER Height & Weight     Self, Time, Situation, Place  Normal, O2(intermittant nasal canula 2 L/min) Continent Weight: 228 lb 6.3 oz (103.6 kg) Height:  5\' 9"  (175.3 cm)  BEHAVIORAL SYMPTOMS/MOOD NEUROLOGICAL BOWEL NUTRITION STATUS      Continent Diet(see discharge summary)  AMBULATORY STATUS COMMUNICATION OF NEEDS Skin   Limited Assist Verbally Other (Comment), Surgical wounds(incision on left leg with  compression wrap and aquacel; generalized ecchymosis on bilateral hands and arms)                       Personal Care Assistance Level of Assistance  Bathing, Dressing, Feeding Bathing Assistance: Limited assistance Feeding assistance: Independent Dressing Assistance: Limited assistance     Functional Limitations Info  Sight, Hearing, Speech Sight Info: Adequate Hearing Info: Adequate Speech Info: Adequate    SPECIAL CARE FACTORS FREQUENCY  OT (By licensed OT), PT (By licensed PT)     PT Frequency: min 5x weekly OT Frequency: min 5x weekly            Contractures Contractures Info: Not present    Additional Factors Info  Code Status, Allergies, Insulin Sliding Scale Code Status Info: full Allergies Info: Bactrim (sulfamethoxazoletrimethoprim), Morphine and related, Vioxx (rofecoxib), Penicillins           Current Medications (01/26/2019):  This is the current hospital active medication list Current Facility-Administered Medications  Medication Dose Route Frequency Provider Last Rate Last Dose  . acetaminophen (TYLENOL) tablet 650 mg  650 mg Oral Q6H PRN Mosetta Anis, MD       Or  . acetaminophen (TYLENOL) suppository 650 mg  650 mg Rectal Q6H PRN Mosetta Anis, MD      . apixaban Arne [place]) tablet 2.5 mg  2.5 mg Oral BID Welford Roche, MD   2.5 mg at 01/26/19 0911  . bisacodyl (DULCOLAX) suppository 10 mg  10 mg Rectal PRN Mosetta Anis, MD      . gabapentin (NEURONTIN) capsule 100  mg  100 mg Oral BID Mosetta Anis, MD   100 mg at 01/26/19 0911  . insulin aspart (novoLOG) injection 0-15 Units  0-15 Units Subcutaneous TID WC Mosetta Anis, MD   3 Units at 01/26/19 0900  . insulin glargine (LANTUS) injection 15 Units  15 Units Subcutaneous QHS Mosetta Anis, MD   15 Units at 01/25/19 2222  . levothyroxine (SYNTHROID) tablet 125 mcg  125 mcg Oral QAC breakfast Mosetta Anis, MD   125 mcg at 01/26/19 0527  . oxyCODONE-acetaminophen (PERCOCET/ROXICET) 5-325  MG per tablet 1-2 tablet  1-2 tablet Oral Q6H PRN Mosetta Anis, MD   2 tablet at 01/26/19 0052  . polyethylene glycol (MIRALAX / GLYCOLAX) packet 17 g  17 g Oral Daily PRN Mosetta Anis, MD      . senna [facility]) tablet 8.6 mg  1 tablet Oral BID Mosetta Anis, MD      . sertraline (ZOLOFT) tablet 100 mg  100 mg Oral Daily Mosetta Anis, MD   100 mg at 01/26/19 0911  . simvastatin (ZOCOR) tablet 40 mg  40 mg Oral QPM Mosetta Anis, MD   40 mg at 01/25/19 1824     Discharge Medications: Please see discharge summary for a list of discharge medications.  Relevant Imaging Results:  Relevant Lab Results:   Additional Information SS#[id number]  [place], [place]

## 2019-01-26 NOTE — Plan of Care (Signed)
Problem: Education: Goal: Knowledge of General Education information will improve Description: Including pain rating scale, medication(s)/side effects and non-pharmacologic comfort measures Outcome: Progressing   Problem: Health Behavior/Discharge Planning: Goal: Ability to manage health-related needs will improve Outcome: Progressing   Problem: Clinical Measurements: Goal: Ability to maintain clinical measurements within normal limits will improve Outcome: Progressing  Goal: Respiratory complications will improve Outcome: Progressing  Goal: Cardiovascular complication will be avoided Outcome: Progressing   Problem: Nutrition: Goal: Adequate nutrition will be maintained Outcome: Progressing   Problem: Coping: Goal: Level of anxiety will decrease Outcome: Progressing   Problem: Elimination: Goal: Will not experience complications related to urinary retention Outcome: Progressing   Problem: Pain Managment: Goal: General experience of comfort will improve Outcome: Progressing   Problem: Safety: Goal: Ability to remain free from injury will improve Outcome: Progressing   Problem: Skin Integrity: Goal: Risk for impaired skin integrity will decrease Outcome: Progressing

## 2019-01-26 NOTE — Plan of Care (Signed)
Pt. To be seen for skilled OT to maximize I and safety with tasks.

## 2019-01-26 NOTE — Progress Notes (Addendum)
Rehab Admissions Coordinator Note:  Patient was screened by Michel Santee for appropriateness for an Inpatient Acute Rehab Consult.  Note pt from SNF for rehab, only able to transition to EOB and achieve standing, but not able to transfer or ambulate.  Pt with TKA, and readmitted for AKI, which is not a qualifying rehab diagnosis.  Will sign off at this time.   Michel Santee 01/26/2019, 2:12 PM  I can be reached at [phone].

## 2019-01-26 NOTE — Progress Notes (Signed)
   Subjective:  No acute events overnight. Patient reports that she had some difficulty sleeping due to her left leg pain. She has had some relief of her symptoms with the use of her oxycodone. She is unsure of how her pain symptoms have changed since the day of her surgery but she reports that seems as though there are periods where the pain gets better before worsening again. States that her surgeons told her that the leg pain would likely get worse before improving. She does not endorse new breathing or chest pain concerns. Denies constipation.  Objective:  Vital signs in last 24 hours: Vitals:   01/25/19 2031 01/25/19 2300 01/26/19 0500 01/26/19 0524  BP: (!) 113/44   (!) 129/42  Pulse: 76   72  Resp: 18   15  Temp: 97.7 F (36.5 C)   97.7 F (36.5 C)  TempSrc: Oral   Oral  SpO2: 96%   98%  Weight:  103.5 kg 103.6 kg   Height:  5\' 9"  (1.753 m)     Weight change:   Intake/Output Summary (Last 24 hours) at 01/26/2019 8242 Last data filed at 01/25/2019 1850 Gross per 24 hour  Intake 240 ml  Output 200 ml  Net 40 ml   Physical Exam  Constitutional: Laying in bed reading paper in no acute distress, appears well-developed and well-nourished. Cardiovascular: Normal heart sounds, normal rate, no murmur appreciated Respiratory: Normal work of breathing, diffuse mild crackles noted in base of lungs GI: Soft. Bowel sounds are normal. No distension. There is no tenderness.  Musculoskeletal: Lower left extremity appears slightly larger than right. No pitting edema noted. Neurological: Is alert and oriented. Skin: Skin of both lower extremities is warm to touch. Light erythema on lateral left leg proximal to the knee with mild tenderness with palpation. Left knee incision site covered by dressing.   Assessment/Plan:  Principal Problem:   AKI (acute kidney injury) ([place]) Active Problems:   Leg swelling   QT prolongation  Left Leg Swelling Final results of lower extremity ultrasound  with no evidence of DVT. In terms of her elevated d-dimer, there are studies which discuss post-operative elevations in d-dimer up to 24 days. Chronic renal disease has also been noted to play a role in elevations of d-dimer. She is currently on day 4 of Eliquis. Based on guidelines for DVT prophylaxis following knee arthroplasty, she will need to remain on Eliquis for an additional 10 days. Following extensive review of the literature regarding adjusting Eliquis dosing in those with renal disease, we decreased her Eliquis to 2.5 mg BID from 10 mg BID. Her pain symptoms are potentially associated with post-operative residual pain seeing as how she is only 11 days out from her surgery. She has mild tenderness with palpation around surgical site but has no notable neurologic deficits and is able to ambulate with help to the bedside commode. Awaiting PT eval today. - Decrease Eliquis to 2.5 mg BID and continue for another 10 days - Oxycodone PRN for pain - PT eval today  Acute on Chronic Kidney injury: Creatinine today improved to 1.79 from 2.39 yesterday. She has continued to urinate without complaints. - Trend BMP - Recommend follow-up BMP with PCP following discharge.     LOS: 1 day   Luna Fuse, Medical Student 01/26/2019, 8:19 AM

## 2019-01-27 DIAGNOSIS — G8918 Other acute postprocedural pain: Secondary | ICD-10-CM

## 2019-01-27 LAB — GLUCOSE, CAPILLARY
Glucose-Capillary: 149 mg/dL — ABNORMAL HIGH (ref 70–99)
Glucose-Capillary: 175 mg/dL — ABNORMAL HIGH (ref 70–99)
Glucose-Capillary: 230 mg/dL — ABNORMAL HIGH (ref 70–99)
Glucose-Capillary: 180 mg/dL — ABNORMAL HIGH (ref 70–99)

## 2019-01-27 LAB — BASIC METABOLIC PANEL
Anion gap: 7 (ref 5–15)
BUN: 36 mg/dL — ABNORMAL HIGH (ref 8–23)
CO2: 25 mmol/L (ref 22–32)
Calcium: 9.2 mg/dL (ref 8.9–10.3)
Chloride: 107 mmol/L (ref 98–111)
Creatinine, Ser: 1.42 mg/dL — ABNORMAL HIGH (ref 0.44–1.00)
GFR calc Af Amer: 43 mL/min — ABNORMAL LOW (ref >60)
GFR calc non Af Amer: 37 mL/min — ABNORMAL LOW (ref >60)
Glucose, Bld: 196 mg/dL — ABNORMAL HIGH (ref 70–99)
Potassium: 4.4 mmol/L (ref 3.5–5.1)
Sodium: 139 mmol/L (ref 135–145)

## 2019-01-27 MED ORDER — METHOCARBAMOL 500 MG PO TABS
500.0000 mg | ORAL_TABLET | Freq: Three times a day (TID) | ORAL | Status: AC
  Administered 2019-01-27 – 2019-01-29 (×9): 500 mg via ORAL
  Filled 2019-01-27 (×9): qty 1

## 2019-01-27 NOTE — TOC Progression Note (Signed)
Transition of Care [facility]) - Progression Note    Patient Details  Name: Toni Hill MRN: [id number] Date of Birth: 09/08/1945  Transition of Care [facility]) CM/SW [place], LCSW Phone Number: 01/27/2019, 3:18 PM  Clinical Narrative:   CSW checked for bed offers, there are no bed offers. CSW will continue to monitor for bed offers.        Expected Discharge Plan: (TBD) Barriers to Discharge: Continued Medical Work up  Expected Discharge Plan and Services Expected Discharge Plan: (TBD)     Post Acute Care Choice: (daughter prefers inpatient rehab) Living arrangements for the past 2 months: Skilled Nursing Facility(recently admitted to [facility])                                       Social Determinants of Health ([place]) Interventions    Readmission Risk Interventions No flowsheet data found.

## 2019-01-27 NOTE — Progress Notes (Signed)
   Subjective:  No acute events overnight. MS. Doddridge is feeling better this morning. She does not have any acute complaints other than pain in her L knee at the site of surgical incision that is unchanged form yesterday. Her main concern being discharged to a skilled nursing facility given her bad experience.  She is requesting to go to CIR.  Objective:  Vital signs in last 24 hours: Vitals:   01/26/19 1556 01/26/19 1954 01/27/19 0406 01/27/19 0749  BP: (!) 105/34 (!) 130/54 (!) 141/43 (!) 100/31  Pulse: 73 75 77 70  Resp: 19     Temp: 98.4 F (36.9 C) 98.4 F (36.9 C) 97.9 F (36.6 C) 98.1 F (36.7 C)  TempSrc: Oral Oral Oral   SpO2: 96% 100% 96% 99%  Weight:      Height:       General: Well-appearing elderly female, watching TV no acute distress CV: Regular rate and rhythm MSK: L knee surgical site without signs of infection, dressing c/d/i  Assessment/Plan:  Principal Problem:   AKI (acute kidney injury) ([place]) Active Problems:   Leg swelling   QT prolongation  # AKI on CKD 3 secondary to Bactrim nephrotoxicity: Resolved.   # Post-surgical L calf pain: Ms. Mischke continues to complain of left calf pain.  She was switched to scheduled Tylenol yesterday due to soft BPs while on low-dose PO opiate, but states this is not controlling her pain. Of note, she is on the CPM machine at 60 degrees and tolerating it well. Will add robaxin to help with calf pain.  She was seen by PT and OT yesterday who recommended CIR.  However CIR admissions coordinator screened patient and determined she does not qualify for CIR due to inability to transfer or ambulate by self. Will discuss with PT/OT. SW consult already in place for SNF placement.   Dispo: Working on disposition, CIR vs SNF.   Welford Roche, MD 01/27/2019, 12:11 PM Pager: [phone]

## 2019-01-27 NOTE — Plan of Care (Signed)

## 2019-01-27 NOTE — Progress Notes (Signed)
Orthopedic Tech Progress Note Patient Details:  Toni Hill August 10, 1945 [id number]  CPM Left Knee CPM Left Knee: On Left Knee Flexion (Degrees): 60 Left Knee Extension (Degrees): 0  Post Interventions Patient Tolerated: Well Instructions Provided: Care of device  [name] 01/27/2019, 7:34 AM

## 2019-01-27 NOTE — Progress Notes (Signed)
Internal Medicine Teaching Service Attending:   I saw and examined the patient. I reviewed the resident's note and I agree with the resident's findings and plan as documented in the resident's note.  Principal Problem:   AKI (acute kidney injury) ([place]) Active Problems:   Leg swelling   QT prolongation  Doing well today.  Medically stable with resolution of acute kidney injury and ruled out for DVT.  Working on placement currently to skilled nursing facility for subacute rehab.  Unfortunately patient has very limited support at home as her husband has myasthenia gravis requires a significant amount of care himself.  Greatly appreciate social work consultation.  Lalla Brothers, MD FACP

## 2019-01-28 ENCOUNTER — Encounter (HOSPITAL_COMMUNITY): Payer: Self-pay | Admitting: *Deleted

## 2019-01-28 LAB — CBC
HCT: 34.2 % — ABNORMAL LOW (ref 36.0–46.0)
Hemoglobin: 10.9 g/dL — ABNORMAL LOW (ref 12.0–15.0)
MCH: 32.4 pg (ref 26.0–34.0)
MCHC: 31.9 g/dL (ref 30.0–36.0)
MCV: 101.8 fL — ABNORMAL HIGH (ref 80.0–100.0)
Platelets: 416 10*3/uL — ABNORMAL HIGH (ref 150–400)
RBC: 3.36 MIL/uL — ABNORMAL LOW (ref 3.87–5.11)
RDW: 12.3 % (ref 11.5–15.5)
WBC: 6.5 10*3/uL (ref 4.0–10.5)
nRBC: 0 % (ref 0.0–0.2)

## 2019-01-28 LAB — GLUCOSE, CAPILLARY
Glucose-Capillary: 202 mg/dL — ABNORMAL HIGH (ref 70–99)
Glucose-Capillary: 201 mg/dL — ABNORMAL HIGH (ref 70–99)
Glucose-Capillary: 226 mg/dL — ABNORMAL HIGH (ref 70–99)
Glucose-Capillary: 186 mg/dL — ABNORMAL HIGH (ref 70–99)

## 2019-01-28 LAB — BASIC METABOLIC PANEL
Anion gap: 8 (ref 5–15)
BUN: 31 mg/dL — ABNORMAL HIGH (ref 8–23)
CO2: 24 mmol/L (ref 22–32)
Calcium: 9.3 mg/dL (ref 8.9–10.3)
Chloride: 107 mmol/L (ref 98–111)
Creatinine, Ser: 1.27 mg/dL — ABNORMAL HIGH (ref 0.44–1.00)
GFR calc Af Amer: 49 mL/min — ABNORMAL LOW (ref >60)
GFR calc non Af Amer: 42 mL/min — ABNORMAL LOW (ref >60)
Glucose, Bld: 193 mg/dL — ABNORMAL HIGH (ref 70–99)
Potassium: 4 mmol/L (ref 3.5–5.1)
Sodium: 139 mmol/L (ref 135–145)

## 2019-01-28 NOTE — Plan of Care (Signed)

## 2019-01-28 NOTE — Progress Notes (Signed)
   Subjective:  No acute events overnight.  Toni Hill is doing well this morning and does not have any acute complaints. She is eating breakfast. Excited to work with PT today.  Objective:  Vital signs in last 24 hours: Vitals:   01/27/19 0749 01/27/19 2051 01/27/19 2055 01/28/19 0337  BP: (!) 100/31 (!) 135/43 (!) 141/51 (!) 139/49  Pulse: 70 76  78  Resp:      Temp: 98.1 F (36.7 C) 98.2 F (36.8 C)  97.6 F (36.4 C)  TempSrc:  Oral  Oral  SpO2: 99% 99%  94%  Weight:      Height:       General: Well-appearing elderly female in no acute distress MSK: LLE on CPM. No pain on palpation of L knee or calf.   Assessment/Plan:  Principal Problem:   AKI (acute kidney injury) ([place]) Active Problems:   Leg swelling   QT prolongation  # Deconditioning after recent TKR: Toni Hill is doing well this morning. Her pain is well controlled and she continues to tolerate CPM at 60 degrees. Unfortunately she is not a candidate for CIR due to inability to ambulate or transfer by herself.  She will continue working with PT and OT pending SNF placement.  Continuing scheduled Tylenol and Robaxin for pain. Her AKI is resolving her renal functions are baseline.  She is medically stable for discharge.  Dispo: Pending SNF placement vs CIR.   Welford Roche, MD 01/28/2019, 11:46 AM Pager: [phone]

## 2019-01-29 ENCOUNTER — Telehealth: Payer: Self-pay | Admitting: Orthopaedic Surgery

## 2019-01-29 DIAGNOSIS — R531 Weakness: Secondary | ICD-10-CM

## 2019-01-29 LAB — BASIC METABOLIC PANEL
Anion gap: 8 (ref 5–15)
BUN: 35 mg/dL — ABNORMAL HIGH (ref 8–23)
CO2: 24 mmol/L (ref 22–32)
Calcium: 9.3 mg/dL (ref 8.9–10.3)
Chloride: 107 mmol/L (ref 98–111)
Creatinine, Ser: 1.35 mg/dL — ABNORMAL HIGH (ref 0.44–1.00)
GFR calc Af Amer: 45 mL/min — ABNORMAL LOW (ref >60)
GFR calc non Af Amer: 39 mL/min — ABNORMAL LOW (ref >60)
Glucose, Bld: 254 mg/dL — ABNORMAL HIGH (ref 70–99)
Potassium: 4 mmol/L (ref 3.5–5.1)
Sodium: 139 mmol/L (ref 135–145)

## 2019-01-29 LAB — GLUCOSE, CAPILLARY
Glucose-Capillary: 188 mg/dL — ABNORMAL HIGH (ref 70–99)
Glucose-Capillary: 149 mg/dL — ABNORMAL HIGH (ref 70–99)
Glucose-Capillary: 202 mg/dL — ABNORMAL HIGH (ref 70–99)
Glucose-Capillary: 220 mg/dL — ABNORMAL HIGH (ref 70–99)

## 2019-01-29 NOTE — Progress Notes (Signed)
Orthopedic Tech Progress Note Patient Details:  Toni Hill Apr 28, 1946 [id number]  Ortho Devices Ortho Device/Splint Interventions: Application   Post Interventions Patient Tolerated: (P) Well Instructions Provided: Care of device   [name] 01/29/2019, 6:41 PM

## 2019-01-29 NOTE — Progress Notes (Signed)
Orthopedic Tech Progress Note Patient Details:  Toni Hill [birth year] [id number]  CPM Left Knee CPM Left Knee: On Left Knee Flexion (Degrees): 60 Left Knee Extension (Degrees): 0  Post Interventions Patient Tolerated: (P) Well Instructions Provided: Care of device  [name] 01/29/2019, 1:46 PM

## 2019-01-29 NOTE — Progress Notes (Addendum)
Physical Therapy Treatment Patient Details Name: Toni Hill MRN: [id number] DOB: 08/03/1945 Today's Date: 01/29/2019    History of Present Illness Pt is a 73 y/o female who was admitted to hospital with AKI. Pnt recently hospitalized for s/p L TKA. PMH includes bipolar disorder, HTN, DM, CKD, and fibromyalgia.     PT Comments    Pt performed gt training and re-educated on resting in supported extension to improve R knee extension.  Informed nursing to obtain order for bone foam for patient.  Ortho tech to bring bone foam up.  Plan next session for continued strengthening and progression of functional mobility.  Will update recommendations to snf based on CIR denial as patient lives with ill husband who cannot provide assistance.     Follow Up Recommendations  SNF     Equipment Recommendations  None recommended by PT    Recommendations for Other Services Rehab consult     Precautions / Restrictions Precautions Precautions: Fall;Knee Precaution Booklet Issued: No Precaution Comments: previous admit for TKA Restrictions Weight Bearing Restrictions: No LLE Weight Bearing: Weight bearing as tolerated    Mobility  Bed Mobility Overal bed mobility: Needs Assistance Bed Mobility: Supine to Sit     Supine to sit: Supervision     General bed mobility comments: Supervision to move to edge of bed unassisted.  Transfers Overall transfer level: Needs assistance Equipment used: Rolling walker (2 wheeled) Transfers: Sit to/from Stand Sit to Stand: Min guard         General transfer comment: Cues for hand placement to and from seated surface.  Poor eccentric loading returning to seated surface.  Ambulation/Gait Ambulation/Gait assistance: Min guard Gait Distance (Feet): 150 Feet Assistive device: Rolling walker (2 wheeled) Gait Pattern/deviations: Step-through pattern;Decreased stride length;Trunk flexed;Decreased weight shift to left;Decreased dorsiflexion - left      General Gait Details: Cues for L heel strike and continuous step through pattern.   Stairs             Wheelchair Mobility    Modified Rankin (Stroke Patients Only)       Balance Overall balance assessment: Needs assistance Sitting-balance support: Feet supported Sitting balance-Leahy Scale: Good       Standing balance-Leahy Scale: Poor                              Cognition Arousal/Alertness: Awake/alert Behavior During Therapy: Anxious Overall Cognitive Status: Within Functional Limits for tasks assessed                                        Exercises General Exercises - Lower Extremity Quad Sets: AROM;Left;10 reps;Supine    General Comments        Pertinent Vitals/Pain Pain Assessment: 0-10 Pain Score: 4  Pain Location: L knee  Pain Descriptors / Indicators: Operative site guarding;Aching Pain Intervention(s): Monitored during session;Repositioned    Home Living                      Prior Function            PT Goals (current goals can now be found in the care plan section) Acute Rehab PT Goals Patient Stated Goal: To go to rehab before going home Potential to Achieve Goals: Good Progress towards PT goals: Progressing toward goals    Frequency  Min 3X/week      PT Plan Discharge plan needs to be updated    Co-evaluation              AM-PAC PT "6 Clicks" Mobility   Outcome Measure  Help needed turning from your back to your side while in a flat bed without using bedrails?: A Little Help needed moving from lying on your back to sitting on the side of a flat bed without using bedrails?: A Little Help needed moving to and from a bed to a chair (including a wheelchair)?: A Little Help needed standing up from a chair using your arms (e.g., wheelchair or bedside chair)?: A Little Help needed to walk in hospital room?: A Little Help needed climbing 3-5 steps with a railing? : A Little 6 Click  Score: 18    End of Session Equipment Utilized During Treatment: Gait belt Activity Tolerance: Patient tolerated treatment well Patient left: in chair;with call bell/phone within reach;with chair alarm set Nurse Communication: Mobility status PT Visit Diagnosis: Unsteadiness on feet (R26.81);History of falling (Z91.81);Repeated falls (R29.6);Pain;Muscle weakness (generalized) (M62.81) Pain - Right/Left: Left Pain - part of body: Knee     Time: 3838-1840 PT Time Calculation (min) (ACUTE ONLY): 23 min  Charges:  $Gait Training: 8-22 mins $Therapeutic Exercise: 8-22 mins                     Governor Rooks, PTA Acute Rehabilitation Services Pager [phone] Office [phone]     Kasey Ewings [name] 01/29/2019, 5:18 PM

## 2019-01-29 NOTE — Telephone Encounter (Signed)
Please advise 

## 2019-01-29 NOTE — Progress Notes (Signed)
  Date: 01/29/2019  Patient name: Odin record number: [id number]  Date of birth: 12/05/1945   I have seen and evaluated this patient and I have discussed the plan of care with the house staff. Please see their note for complete details. I concur with their findings with the following additions/corrections: Ms. Sawchuk was seen this morning on team rounds.  She was not able to work with PT over the weekend.  We will wait to see if she does any better with her sessions today as that will determine her dispo plans.  Bartholomew Crews, MD 01/29/2019, 2:26 PM

## 2019-01-29 NOTE — Telephone Encounter (Signed)
Patient called advised she is still in the hospital. Patient asked if she can do her rehab at [facility]. Patient said she is walking with her walker. The number to contact patient is [phone]

## 2019-01-29 NOTE — Progress Notes (Signed)
   Subjective:  No acute events overnight. Toni Hill is looking forward to working for PT and OT today for reconsideration for CIR. She did not have much sleep last night, but did not have any other complaints this morning.   Objective:  Vital signs in last 24 hours: Vitals:   01/28/19 1404 01/28/19 2122 01/29/19 0447 01/29/19 1525  BP: (!) 131/50 (!) 145/72 (!) 128/47 (!) 147/58  Pulse: 77 80 73 80  Resp: 18   16  Temp: (!) 97.5 F (36.4 C) 98.1 F (36.7 C) 98.7 F (37.1 C) 98.2 F (36.8 C)  TempSrc: Oral Oral Oral Oral  SpO2: 98% 93% 96% 96%  Weight:      Height:       General: well-appearing female in no acute distress CV: RRR, no murmurs  MSK: L knee dressing c/d/i, no signs of infection   Assessment/Plan:  Principal Problem:   AKI (acute kidney injury) ([facility]) Active Problems:   Leg swelling   QT prolongation  # Deconditioning after TKR POD 14: Doing overall well. She will work with PT and OT today to see if she would be a candidate for CIR vs SNF. SW spoke with her daughter who agrees with SNFplacement if patient is not a candidate for CIR. However, patient has capacity to make decisions and does want to be discharged to a SNF. Will follow up Pt/OT recs. We will continue Eliquis 2.5 mg BID for VTE ppx.   Dispo: Pending placement based on PT/OT evaluation.   Welford Roche, MD 01/29/2019, 3:29 PM Pager: [phone]

## 2019-01-29 NOTE — Care Management Important Message (Signed)
Important Message  Patient Details  Name: Toni Hill MRN: [id number] Date of Birth: [birth year]   Medicare Important Message Given:  Yes     Memory Argue 01/29/2019, 3:06 PM   SIGNED

## 2019-01-29 NOTE — Progress Notes (Signed)
Orthopedic Tech Progress Note Patient Details:  Toni Hill Oct 12, 1945 [id number]  Ortho Devices Ortho Device/Splint Interventions: Application   Post Interventions Patient Tolerated: (P) Well Instructions Provided: Care of device   [name] 01/29/2019, 6:42 PM

## 2019-01-30 ENCOUNTER — Inpatient Hospital Stay: Payer: Medicare Other | Admitting: Physician Assistant

## 2019-01-30 LAB — GLUCOSE, CAPILLARY
Glucose-Capillary: 202 mg/dL — ABNORMAL HIGH (ref 70–99)
Glucose-Capillary: 202 mg/dL — ABNORMAL HIGH (ref 70–99)
Glucose-Capillary: 270 mg/dL — ABNORMAL HIGH (ref 70–99)

## 2019-01-30 MED ORDER — APIXABAN 2.5 MG PO TABS: 2.5000 mg | ORAL_TABLET | Freq: Two times a day (BID) | ORAL | 0 refills | Status: DC

## 2019-01-30 NOTE — Progress Notes (Signed)
Patient has bed offer at [facility].   CSW has paged Residents to inquire as to when patient will be medically ready to discharge to [facility]. Pending response.   [place], [place]

## 2019-01-30 NOTE — Progress Notes (Signed)
Sutures removed from the left knee (count #20)  Incision appears to be healing well.  Incision edges intact.  No drainage. Slight pinkness noted to the knee area.  Patient tolerated well. The patient was educated that the steri strips that were applied will fall off on their own.

## 2019-01-30 NOTE — Progress Notes (Signed)
  Date: 01/30/2019  Patient name: Toni Hill record number: [id number]  Date of birth: 1946/02/20   I have seen and evaluated this patient and I have discussed the plan of care with the house staff. Please see their note for complete details. I concur with their findings with the following additions/corrections: Toni Hill was seen this morning on team rounds.  She was tearful that she was not accepted into CIR, but she would have to go to another SNF, that there was an issue with the CPM device yesterday.  Dr. Frederico Hamman was able to talk with her and get her feeling better.  She is medically stable to be transferred to a SNF and she and her daughter are both close and [place].  Bartholomew Crews, MD 01/30/2019, 1:52 PM

## 2019-01-30 NOTE — Telephone Encounter (Signed)
noted 

## 2019-01-30 NOTE — Plan of Care (Signed)

## 2019-01-30 NOTE — Progress Notes (Signed)
Orthopedic Tech Progress Note Patient Details:  Toni Hill May 31, 1946 [id number]  CPM Left Knee CPM Left Knee: On Left Knee Flexion (Degrees): 60 Left Knee Extension (Degrees): 0 Additional Comments: foot roll  Post Interventions Patient Tolerated: (P) Well Instructions Provided: Care of device  Janit Pagan 01/30/2019, 4:41 PM

## 2019-01-30 NOTE — Progress Notes (Signed)
   Subjective:  No acute events overnight. Toni Hill reports her leg was caught in the CPM apparatus yesterday that resulted in some pain in her L leg. We discussed making sure she has the switch to turn off the machine next time.   Objective:  Vital signs in last 24 hours: Vitals:   01/29/19 1525 01/29/19 2002 01/30/19 0428 01/30/19 0807  BP: (!) 147/58 (!) 152/68 (!) 151/55 (!) 156/68  Pulse: 80 79 81 81  Resp: 16 18 19 16   Temp: 98.2 F (36.8 C) 98.4 F (36.9 C) 97.6 F (36.4 C) 98.1 F (36.7 C)  TempSrc: Oral Oral Oral Oral  SpO2: 96% 98% 95% 95%  Weight:      Height:       General: well-appearing female in no acute distress  MSK: removed bandage, incision appears to be healing well without signs of infection, very mild bruising   Assessment/Plan:  Principal Problem:   AKI (acute kidney injury) ([place]) Active Problems:   Leg swelling   QT prolongation   #Deconditioning after TKR POD 15: Toni Hill continues to do well overall.  Per PT recommendation she does not qualify for CIR and will have to go to a SNF for further rehabilitation.  Patient has discussed this with her daughter and they have decided on [facility].  She is medically stable for discharge when she has a bed available.  We will continue Eliquis 2.5 mg twice daily for VTE prophylaxis. I would continue this for 1-2 weeks more. Her VTE risk will be lower once she becomes more mobile.   Dispo: Pending SNF placement. She is medically stable for discharge.   Welford Roche, MD 01/30/2019, 12:03 PM Pager: [phone]

## 2019-01-30 NOTE — Progress Notes (Signed)
Subjective:    Patient reports pain as moderate.  Patient is 2 weeks s/p left TKR.  She was scheduled to f/u with [place] in office today for wound check.  She has been doing well over past few days in regards to the left knee.  Using cpm and working with PT.    Objective: Vital signs in last 24 hours: Temp:  [97.6 F (36.4 C)-98.4 F (36.9 C)] 97.6 F (36.4 C) (07/28 0428) Pulse Rate:  [79-81] 81 (07/28 0428) Resp:  [16-19] 19 (07/28 0428) BP: (147-152)/(55-68) 151/55 (07/28 0428) SpO2:  [95 %-98 %] 95 % (07/28 0428)  Intake/Output from previous day: 07/27 0701 - 07/28 0700 In: 720 [P.O.:720] Out: -  Intake/Output this shift: No intake/output data recorded.  Recent Labs    01/28/19 0324  HGB 10.9*   Recent Labs    01/28/19 0324  WBC 6.5  RBC 3.36*  HCT 34.2*  PLT 416*   Recent Labs    01/28/19 0324 01/29/19 0345  NA 139 139  K 4.0 4.0  CL 107 107  CO2 24 24  BUN 31* 35*  CREATININE 1.27* 1.35*  GLUCOSE 193* 254*  CALCIUM 9.3 9.3   No results for input(s): LABPT, INR in the last 72 hours.  Neurologically intact Neurovascular intact Sensation intact distally Intact pulses distally Dorsiflexion/Plantar flexion intact Incision: dressing C/D/I No cellulitis present Compartment soft   Assessment/Plan:    Up with therapy  WBAT LLE Please remove sutures and apply steri strips to left knee incision (this was discussed with nurse Caren Griffins this am) Continue CPM  Will need to f/u with Dr. Erlinda Hong in 4 weeks for wound check and xrays D/c dispo per medicine (looks like SNF).  Patient will need PT until f/u appt in 4 weeks with Dr. Kerin Ransom 01/30/2019, 8:06 AM

## 2019-01-30 NOTE — Telephone Encounter (Signed)
Saw patient this am and spoke to her

## 2019-01-31 DIAGNOSIS — Z7901 Long term (current) use of anticoagulants: Secondary | ICD-10-CM

## 2019-01-31 DIAGNOSIS — N183 Chronic kidney disease, stage 3 (moderate): Secondary | ICD-10-CM

## 2019-01-31 LAB — GLUCOSE, CAPILLARY
Glucose-Capillary: 211 mg/dL — ABNORMAL HIGH (ref 70–99)
Glucose-Capillary: 221 mg/dL — ABNORMAL HIGH (ref 70–99)

## 2019-01-31 NOTE — Progress Notes (Signed)
Report called to [facility] and report was given to [facility] (nurse).  A discharge packet will be sent per [employer]

## 2019-01-31 NOTE — Progress Notes (Signed)
Orthopedic Tech Progress Note Patient Details:  [name] 12-19-1945 [id number]  Patient ID: Toni Hill, female   DOB: 30-Dec-1945, 73 y.o.   MRN: [id number] Pt refused cpm. [name] she was sore from being on to long last night.  [name] 01/31/2019, 6:26 AM

## 2019-01-31 NOTE — Progress Notes (Signed)
Physical Therapy Treatment Patient Details Name: Toni Hill MRN: [id number] DOB: Jan 25, 1946 Today's Date: 01/31/2019    History of Present Illness Pt is a 73 y/o female who was admitted to hospital with AKI. Pnt recently hospitalized for s/p L TKA. PMH includes bipolar disorder, HTN, DM, CKD, and fibromyalgia.     PT Comments    Pt performed gt training and functional mobility during session this am.  She is currently requiring supervision with all aspects of mobility.  Pt reviewed and completed LE exercises on L side.  She is progressing well.  Plan to d/c today to rehab to return to baseline independence.      Follow Up Recommendations  SNF     Equipment Recommendations  None recommended by PT    Recommendations for Other Services       Precautions / Restrictions Precautions Precautions: Fall;Knee Precaution Booklet Issued: No Precaution Comments: previous admit for TKA Restrictions Weight Bearing Restrictions: No LLE Weight Bearing: Weight bearing as tolerated    Mobility  Bed Mobility Overal bed mobility: Needs Assistance Bed Mobility: Supine to Sit     Supine to sit: Supervision Sit to supine: Supervision      Transfers Overall transfer level: Needs assistance Equipment used: Rolling walker (2 wheeled) Transfers: Sit to/from Stand Sit to Stand: Supervision         General transfer comment: Cues for safety and hand placement.  Cues to keep RW close when backing to seated surface to prepare for sitting.  Ambulation/Gait Ambulation/Gait assistance: Supervision Gait Distance (Feet): 200 Feet Assistive device: Rolling walker (2 wheeled) Gait Pattern/deviations: Step-through pattern;Antalgic;Trunk flexed     General Gait Details: Cues for sequencing and progression to step through pattern.  Cues for forward gaze.   Stairs             Wheelchair Mobility    Modified Rankin (Stroke Patients Only)       Balance Overall balance  assessment: Needs assistance Sitting-balance support: Feet supported Sitting balance-Leahy Scale: Good       Standing balance-Leahy Scale: Poor                              Cognition Arousal/Alertness: Awake/alert Behavior During Therapy: Anxious Overall Cognitive Status: Within Functional Limits for tasks assessed                                        Exercises Total Joint Exercises Ankle Circles/Pumps: AROM;Both;20 reps;Supine Quad Sets: AROM;Left;10 reps;Supine Heel Slides: AAROM;Left;10 reps;Supine Hip ABduction/ADduction: AROM;Left;10 reps;Supine Straight Leg Raises: AROM;Left;10 reps;Supine    General Comments        Pertinent Vitals/Pain Pain Assessment: 0-10 Pain Score: 4  Pain Location: L knee  Pain Descriptors / Indicators: Operative site guarding;Aching Pain Intervention(s): Monitored during session;Repositioned    Home Living                      Prior Function            PT Goals (current goals can now be found in the care plan section) Acute Rehab PT Goals Patient Stated Goal: To go to rehab before going home PT Goal Formulation: With patient Potential to Achieve Goals: Good Progress towards PT goals: Progressing toward goals    Frequency    Min 3X/week  PT Plan Current plan remains appropriate    Co-evaluation              AM-PAC PT "6 Clicks" Mobility   Outcome Measure  Help needed turning from your back to your side while in a flat bed without using bedrails?: A Little Help needed moving from lying on your back to sitting on the side of a flat bed without using bedrails?: A Little Help needed moving to and from a bed to a chair (including a wheelchair)?: A Little Help needed standing up from a chair using your arms (e.g., wheelchair or bedside chair)?: A Little Help needed to walk in hospital room?: A Little Help needed climbing 3-5 steps with a railing? : A Little 6 Click Score:  18    End of Session Equipment Utilized During Treatment: Gait belt Activity Tolerance: Patient tolerated treatment well Patient left: in chair;with call bell/phone within reach;with chair alarm set Nurse Communication: Mobility status PT Visit Diagnosis: Unsteadiness on feet (R26.81);History of falling (Z91.81);Repeated falls (R29.6);Pain;Muscle weakness (generalized) (M62.81) Pain - Right/Left: Left Pain - part of body: Knee     Time: 1047-1106 PT Time Calculation (min) (ACUTE ONLY): 19 min  Charges:  $Gait Training: 8-22 mins                     Toni Hill, PTA Acute Rehabilitation Services Pager [phone] Office [phone]     Toni Hill Toni Hill 01/31/2019, 12:54 PM

## 2019-01-31 NOTE — Progress Notes (Signed)
IMTS saw patient this morning and ensured that the patient was to be discharged to skilled nursing facility.  She was doing well and in good spirits.  She mentioned that she been was able to eat breakfast.  She was a little bit upset that she was left in CPM yesterday. Patient is to remain on apixaban. Informed her that her aki has resolved.  Patient's discharge summary and AVS are ready. Called nurse and made sure there were no hindrances to discharge.  Lars Mage, MD Internal Medicine PGY3 IJFTZ:[phone] 01/31/2019, 11:39 AM

## 2019-01-31 NOTE — TOC Transition Note (Signed)
Transition of Care [facility]) - CM/SW Discharge Note   Patient Details  Name: Toni Hill MRN: [id number] Date of Birth: [birth year]  Transition of Care [facility]) CM/SW Contact:  Alberteen Sam, LCSW Phone Number: 01/31/2019, 11:39 AM   Clinical Narrative:     Patient will DC to: [facility] Anticipated DC date: 01/31/2019 Family notified: Melissa Transport PJ:PETK  Per MD patient ready for DC to [facility] . RN, patient, patient's family, and facility notified of DC. Discharge Summary sent to facility. RN given number for report [phone]  . DC packet on chart. Ambulance transport requested for patient.  CSW signing off.  [place], [place]   Final next level of care: Skilled Nursing Facility Barriers to Discharge: No Barriers Identified   Patient Goals and CMS Choice   CMS Medicare.gov Compare Post Acute Care list provided to:: Patient Represenative (must comment)(Melissa (daughter)) Choice offered to / list presented to : Adult Children(Melissa)  Discharge Placement PASRR number recieved: 01/26/19            Patient chooses bed at: [facility] Patient to be transferred to facility by: [facility] Name of family member notified: Melissa Patient and family notified of of transfer: 01/31/19  Discharge Plan and Services     Post Acute Care Choice: (daughter prefers inpatient rehab)                               Social Determinants of Health (SDOH) Interventions     Readmission Risk Interventions No flowsheet data found.

## 2019-01-31 NOTE — Progress Notes (Signed)
  Date: 01/31/2019  Patient name: Toni Hill record number: [id number]  Date of birth: 08/20/1945   I have seen and evaluated this patient and I have discussed the plan of care with the house staff. Please see their note for complete details. I concur with their findings with the following additions/corrections: [name] was seen this morning on team rounds. She [place] ub a great mood today and excited about going to [facility]. Today, her left knee is a little bit uncomfortable after being in the exerciser for so long last night.  Today transferred to SNF today  Bartholomew Crews, MD 01/31/2019, 12:53 PM

## 2019-02-01 ENCOUNTER — Ambulatory Visit: Payer: Medicare Other

## 2019-02-01 ENCOUNTER — Encounter: Payer: Self-pay | Admitting: Internal Medicine

## 2019-02-01 ENCOUNTER — Telehealth: Payer: Self-pay | Admitting: Licensed Clinical Social Worker

## 2019-02-01 NOTE — Telephone Encounter (Signed)
Patient was contacted due to a referral (2nd attempt). Patient will be added to my schedule for 8/4 @ 1:00 for a phone visit.

## 2019-02-02 ENCOUNTER — Inpatient Hospital Stay: Payer: Medicare Other | Admitting: Orthopaedic Surgery

## 2019-02-02 LAB — GLUCOSE, CAPILLARY: Glucose-Capillary: 316 mg/dL — ABNORMAL HIGH (ref 70–99)

## 2019-02-06 ENCOUNTER — Other Ambulatory Visit: Payer: Self-pay

## 2019-02-06 ENCOUNTER — Encounter: Payer: Self-pay | Admitting: Licensed Clinical Social Worker

## 2019-02-06 ENCOUNTER — Ambulatory Visit (INDEPENDENT_AMBULATORY_CARE_PROVIDER_SITE_OTHER): Payer: Medicare Other | Admitting: Licensed Clinical Social Worker

## 2019-02-06 DIAGNOSIS — F419 Anxiety disorder, unspecified: Secondary | ICD-10-CM

## 2019-02-06 NOTE — BH Specialist Note (Signed)
Integrated Behavioral Health Visit via Telemedicine (Telephone)  02/06/2019 Toni Hill [id number]   Session Start time: 1:05  Session End time: 1:50 Total time: 45 minutes   Type of Visit: Telephonic Patient location: At [facility] Provider location: Office All persons participating in visit: Patient and [facility]  Confirmed patient's address: Yes  Confirmed patient's phone number: Yes  Any changes to demographics: No   Discussed confidentiality: Yes    The following statements were read to the patient and/or legal guardian that are established with the [facility] Provider.  "The purpose of this phone visit is to provide behavioral health care while limiting exposure to the coronavirus (COVID19).  There is a possibility of technology failure and discussed alternative modes of communication if that failure occurs."  "By engaging in this telephone visit, you consent to the provision of healthcare.  Additionally, you authorize for your insurance to be billed for the services provided during this telephone visit."   Patient and/or legal guardian consented to telephone visit: Yes   PRESENTING CONCERNS: Patient and/or family reports the following symptoms/concerns: adjustment to recent poor health care, stress, trauma from childhood, and recurrent depression.  Duration of problem: increased over the past month; Severity of problem: mild  STRENGTHS (Protective Factors/Coping Skills): Has a supportive family.   GOALS ADDRESSED: Patient will: 1.  Reduce symptoms of: anxiety, depression and stress  2.  Increase knowledge and/or ability of: coping skills and stress reduction  3.  Demonstrate ability to: Increase healthy adjustment to current life circumstances and Increase adequate support systems for patient/family  INTERVENTIONS: Interventions utilized:  Supportive Counseling Standardized Assessments completed: assessed for SI, HI, and self-harm.  ASSESSMENT: Patient  currently experiencing sadness related to her experience at the rehab center. Patient processed a negative experience she had at a recent rehabilitation place following surgery. Patient feels she was provided very poor care, and reported she almost died. Patient has transitioned to a different rehabilitation center, and is very happy with her care now.  Patient was tearful as she processed her feelings towards how they treated her at the previous rehab. Patient did report the neglect to Medicare.   Patient processed trauma from her past. Patient rarely shares negative experiences from her childhood with others due to fears of judgement or concerns that she might be portraying a victim. Patient was tearful while discussing her childhood, and reported she acts as if she is happy to others.   Patient may benefit from weekly outpatient therapy.  PLAN: Follow up with behavioral health clinician on : one week via phone.  Dessie Coma, [facility], [place]

## 2019-02-08 ENCOUNTER — Ambulatory Visit (INDEPENDENT_AMBULATORY_CARE_PROVIDER_SITE_OTHER): Payer: Medicare Other | Admitting: Orthopaedic Surgery

## 2019-02-08 ENCOUNTER — Encounter: Payer: Self-pay | Admitting: Orthopaedic Surgery

## 2019-02-08 DIAGNOSIS — Z96652 Presence of left artificial knee joint: Secondary | ICD-10-CM

## 2019-02-08 NOTE — Progress Notes (Signed)
Post-Op Visit Note   Patient: Toni Hill           Date of Birth: 1945/11/08           MRN: [id number] Visit Date: 02/08/2019 PCP: Maudie Mercury, MD   Assessment & Plan:  Chief Complaint:  Chief Complaint  Patient presents with  . Left Knee - Routine Post Op   Visit Diagnoses:  1. Status post total left knee replacement     Plan: Toni Hill is 3 weeks status post left total knee replacement.  She comes in today for her first visit.  She is making good progress with PT.  She is currently living at [facility].  Unfortunately she had a bad experience at her first SNF.  She was readmitted to the hospital briefly for acute renal failure which she has recovered from.  She is in good spirits today.  Her surgical incision is healed without signs of infection.  Range of motion is 3 to 90 degrees.  She looks good from my standpoint today.  We will discontinue her Eliquis and begin aspirin 81 mg twice daily for the next 4 weeks.  She is to continue with physical therapy for knee rehab.  She will need 2 view x-rays of the left knee on return.  Follow-Up Instructions: Return in about 4 weeks (around 03/08/2019).   Orders:  No orders of the defined types were placed in this encounter.  No orders of the defined types were placed in this encounter.   Imaging: No results found.  PMFS History: Patient Active Problem List   Diagnosis Date Noted  . Pain of left calf 01/26/2019  . AKI (acute kidney injury) ([place]) 01/25/2019  . Leg swelling 01/25/2019  . QT prolongation 01/25/2019  . Elevated d-dimer 01/23/2019  . Acute blood loss as cause of postoperative anemia 01/23/2019  . Cellulitis of leg, left 01/23/2019  . At risk for adverse drug event 01/22/2019  . Bipolar 1 disorder ([place])   . 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 Anxiety 12/30/2018  . Stage 3 chronic kidney disease ([place]) 06/28/2016  . Hypercholesterolemia 08/20/2015  . Type 2 diabetes mellitus ([place]) 02/28/2014   . Esophageal reflux 02/28/2014  . Stress incontinence, female 02/28/2014  . Acquired hypothyroidism 02/28/2014  . Essential hypertension 11/17/2011   Past Medical History:  Diagnosis Date  . Bilateral lower extremity edema    right > left  . Bipolar 1 disorder ([place])   . CKD (chronic kidney disease) stage 3, GFR 30-59 ml/min (HCC)     Dr Lawson Radar, [facility]  . Diabetes, polyneuropathy ([place])    FEET AND FINGER TIPS  . Fibromyalgia   . GERD (gastroesophageal reflux disease)   . History of chronic gastritis   . Hyperlipidemia   . Hypertension    per pt was take off bp medication because of kidney disease told by her nephrologist  . Hypothyroidism   . Mixed incontinence urge and stress   . OA (osteoarthritis)    KNEES  . OAB (overactive bladder)   . PONV (postoperative nausea and vomiting)   . 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 Type 2 diabetes mellitus treated with insulin ([place])   . Wears glasses     Family History  Problem Relation Age of Onset  . Breast cancer Mother 32  . Cancer Father        ? type  . Breast cancer Sister 1  . Breast  cancer Maternal Grandmother        ? age    Past Surgical History:  Procedure Laterality Date  . CHOLECYSTECTOMY OPEN  1990's  . COLONOSCOPY  last one 07-17-2014  . CYSTOSCOPY WITH INJECTION N/A 10/29/2016   Procedure: CYSTOSCOPY WITH INJECTION BOTOX 100 UNITS, urethral dilation;  Surgeon: Carolan Clines, MD;  Location: [facility];  Service: Urology;  Laterality: N/A;  . ESOPHAGOGASTRODUODENOSCOPY  last one 07-01-2016  . EXCISIONAL BREAST BX  1990   BENIGN  . ORIF TIBIA & FIBULA FRACTURES  2010   retained rod  . TONSILLECTOMY  age 34  . TOTAL KNEE ARTHROPLASTY Left 01/15/2019   Procedure: LEFT TOTAL KNEE ARTHROPLASTY;  Surgeon: Leandrew Koyanagi, MD;  Location: [facility];  Service: Orthopedics;  Laterality: Left;  Marland Kitchen VAGINAL HYSTERECTOMY  1979  . VENTRAL HERNIA REPAIR  1990's   Social History    Occupational History  . Not on file  Tobacco Use  . Smoking status: Never Smoker  . Smokeless tobacco: Never Used  Substance and Sexual Activity  . Alcohol use: No  . Drug use: No  . Sexual activity: Not Currently    Comment: 1st intercourse 73 yo-Fewer than 5 partners

## 2019-02-13 ENCOUNTER — Encounter: Payer: Self-pay | Admitting: Licensed Clinical Social Worker

## 2019-02-13 ENCOUNTER — Ambulatory Visit (INDEPENDENT_AMBULATORY_CARE_PROVIDER_SITE_OTHER): Payer: Medicare Other | Admitting: Licensed Clinical Social Worker

## 2019-02-13 DIAGNOSIS — F419 Anxiety disorder, unspecified: Secondary | ICD-10-CM

## 2019-02-13 NOTE — BH Specialist Note (Signed)
Integrated Behavioral Health Visit via Telemedicine (Telephone)  02/13/2019 MICHEL ESKELSON [id number]   Session Start time: 1:10  Session End time: 1:30 Total time: 20 minutes  Type of Visit: Telephonic Patient location: At [facility] Provider location: Office All persons participating in visit: Patient and [facility]  Confirmed patient's address: Yes  Confirmed patient's phone number: Yes  Any changes to demographics: No   Discussed confidentiality: Yes    The following statements were read to the patient and/or legal guardian that are established with the [facility] Provider.  "The purpose of this phone visit is to provide behavioral health care while limiting exposure to the coronavirus (COVID19).  There is a possibility of technology failure and discussed alternative modes of communication if that failure occurs."  "By engaging in this telephone visit, you consent to the provision of healthcare.  Additionally, you authorize for your insurance to be billed for the services provided during this telephone visit."   Patient and/or legal guardian consented to telephone visit: Yes   PRESENTING CONCERNS: Patient and/or family reports the following symptoms/concerns: adjustment to recent poor health care, stress, trauma from childhood, and recurrent depression Duration of problem: increased over the past month; Severity of problem: mild  STRENGTHS (Protective Factors/Coping Skills): Has a supportive family.   GOALS ADDRESSED: Patient will: 1.  Reduce symptoms of: anxiety, depression and stress  2.  Increase knowledge and/or ability of: coping skills and stress reduction  3.  Demonstrate ability to: Increase healthy adjustment to current life circumstances, Increase adequate support systems for patient/family and Increase motivation to adhere to plan of care  INTERVENTIONS: Interventions utilized:  Mindfulness or Relaxation Training, Brief CBT and Supportive  Counseling Standardized Assessments completed: Not Needed  ASSESSMENT: Patient currently experiencing mild anxiety, but reported an improvement over the past week. Patient will be leaving the rehab center tomorrow. Patient is hopeful that her husband will be supportive. Patient is planning to follow the aftercare plan, and understands that she needs to follow the doctor's instructions for healing.  Patient reported that she does not take deep breaths, and wants to work on her breathing. Patient continues to process negative feelings related to her past.   Patient may benefit from outpatient therapy.  PLAN: 1. Follow up with behavioral health clinician on : prn  Dessie Coma, [facility], [place]

## 2019-02-14 ENCOUNTER — Telehealth: Payer: Self-pay | Admitting: Orthopaedic Surgery

## 2019-02-14 NOTE — Telephone Encounter (Signed)
Received voicemail message from [place] with [facility] advised patient is discharging home from a skilled nursing facility tomorrow and they are wanting [place] to go out for PT, OT and an Aid. Tanya asked if Dr Erlinda Hong will be following this patient and will ok orders? The number to contact Lavella Lemons is [phone]

## 2019-02-14 NOTE — Telephone Encounter (Signed)
Please advise thanks.

## 2019-02-14 NOTE — Telephone Encounter (Signed)
Verbal given 

## 2019-02-14 NOTE — Telephone Encounter (Signed)
Yes i'm ok with orders

## 2019-02-20 ENCOUNTER — Other Ambulatory Visit: Payer: Self-pay | Admitting: Orthopaedic Surgery

## 2019-02-20 NOTE — Telephone Encounter (Signed)
Please advise 

## 2019-02-21 ENCOUNTER — Ambulatory Visit (INDEPENDENT_AMBULATORY_CARE_PROVIDER_SITE_OTHER): Payer: Medicare Other | Admitting: Internal Medicine

## 2019-02-21 ENCOUNTER — Encounter: Payer: Self-pay | Admitting: Internal Medicine

## 2019-02-21 ENCOUNTER — Other Ambulatory Visit: Payer: Self-pay

## 2019-02-21 VITALS — BP 118/76 | HR 91 | Temp 98.4°F | Ht 69.0 in | Wt 226.4 lb

## 2019-02-21 DIAGNOSIS — N179 Acute kidney failure, unspecified: Secondary | ICD-10-CM

## 2019-02-21 DIAGNOSIS — N183 Chronic kidney disease, stage 3 unspecified: Secondary | ICD-10-CM

## 2019-02-21 DIAGNOSIS — Z96652 Presence of left artificial knee joint: Secondary | ICD-10-CM

## 2019-02-21 NOTE — Patient Instructions (Addendum)
Ms. Hunke, It was a pleasure meeting you!   Today we discussed your recovery from knee surgery. You are doing great. Keep up the good work with walking and your rehab at home.   Continue to keep an eye on your blood pressure and if you have symptoms of low blood pressure. This can be a side effect of the pain medications you are taking. Be sure to continue drinking plenty of fluids.   I am checking your kidney function today to make sure everything is stable. I will let you know if we need to make any changes.   You will get to meet Dr. Gilford Rile on 04/03/19.   If you have any questions or concerns before then, please do not hesitate to call.   Take care! Dr. Koleen Distance

## 2019-02-21 NOTE — Progress Notes (Signed)
   CC: CKD 3, s/p left TKR  HPI:  Toni Hill is a 73 y.o. female with PMHx listed below who presents for follow-up after being discharged from rehab for left total knee replacement. Prior to discharge from rehab, she was admitted from 7/22-7/28 for AKI in the setting of recent Bactrim use.   She has been recovering well at home and is pleased with how rehab and recovery for her knee is going. She has no acute concerns today.   Please see problem based charting for further details.   Past Medical History:  Diagnosis Date  . Bilateral lower extremity edema    right > left  . Bipolar 1 disorder ([place])   . CKD (chronic kidney disease) stage 3, GFR 30-59 ml/min (HCC)     Dr Lawson Radar, [facility]  . Diabetes, polyneuropathy ([place])    FEET AND FINGER TIPS  . Fibromyalgia   . GERD (gastroesophageal reflux disease)   . History of chronic gastritis   . Hyperlipidemia   . Hypertension    per pt was take off bp medication because of kidney disease told by her nephrologist  . Hypothyroidism   . Mixed incontinence urge and stress   . OA (osteoarthritis)    KNEES  . OAB (overactive bladder)   . PONV (postoperative nausea and vomiting)   . 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 Type 2 diabetes mellitus treated with insulin ([place])   . Wears glasses    Review of Systems:   Constitutional: negative for fevers, chills Cardio: negative for chest pain, palpitations Resp: negative for cough, shortness of breath Skin: negative increased redness or drainage at incision site  GU: negative for hematuria or dysuria  Physical Exam:  Vitals:   02/21/19 1547  BP: 118/76  Pulse: 91  Temp: 98.4 F (36.9 C)  TempSrc: Oral  SpO2: 99%  Weight: 226 lb 6.4 oz (102.7 kg)  Height: 5\' 9"  (1.753 m)   General: alert, pleasant, well-appearing female in NAD CV: RRR; no m/r/g Pulm: normal work of breathing; lungs CTAB Ext: well-healing incision from left TKR; left knee mildly swollen  compared to right   Assessment & Plan:   See Encounters Tab for problem based charting.  Patient discussed with Dr. Evette Doffing

## 2019-02-22 ENCOUNTER — Ambulatory Visit (INDEPENDENT_AMBULATORY_CARE_PROVIDER_SITE_OTHER): Payer: Medicare Other | Admitting: Internal Medicine

## 2019-02-22 ENCOUNTER — Other Ambulatory Visit: Payer: Self-pay

## 2019-02-22 ENCOUNTER — Encounter: Payer: Self-pay | Admitting: Internal Medicine

## 2019-02-22 ENCOUNTER — Ambulatory Visit (HOSPITAL_COMMUNITY)
Admission: RE | Admit: 2019-02-22 | Discharge: 2019-02-22 | Disposition: A | Discharge status: Home / Self Care | Payer: Medicare Other | Source: Ambulatory Visit | Attending: Internal Medicine | Admitting: Internal Medicine

## 2019-02-22 VITALS — BP 119/54 | HR 85 | Temp 98.0°F | Ht 69.0 in | Wt 227.0 lb

## 2019-02-22 DIAGNOSIS — N179 Acute kidney failure, unspecified: Secondary | ICD-10-CM

## 2019-02-22 DIAGNOSIS — E875 Hyperkalemia: Secondary | ICD-10-CM | POA: Diagnosis present

## 2019-02-22 LAB — BMP8+ANION GAP
Anion Gap: 15 mmol/L (ref 10.0–18.0)
BUN/Creatinine Ratio: 17 (ref 12–28)
BUN: 38 mg/dL — ABNORMAL HIGH (ref 8–27)
CO2: 22 mmol/L (ref 20–29)
Calcium: 9.2 mg/dL (ref 8.7–10.3)
Chloride: 105 mmol/L (ref 96–106)
Creatinine, Ser: 2.23 mg/dL — ABNORMAL HIGH (ref 0.57–1.00)
GFR calc Af Amer: 25 mL/min/{1.73_m2} — ABNORMAL LOW (ref >59)
GFR calc non Af Amer: 21 mL/min/{1.73_m2} — ABNORMAL LOW (ref >59)
Glucose: 140 mg/dL — ABNORMAL HIGH (ref 65–99)
Potassium: 5.5 mmol/L — ABNORMAL HIGH (ref 3.5–5.2)
Sodium: 142 mmol/L (ref 134–144)

## 2019-02-22 LAB — BASIC METABOLIC PANEL
Anion gap: 7 (ref 5–15)
BUN: 41 mg/dL — ABNORMAL HIGH (ref 8–23)
CO2: 24 mmol/L (ref 22–32)
Calcium: 9.2 mg/dL (ref 8.9–10.3)
Chloride: 107 mmol/L (ref 98–111)
Creatinine, Ser: 2.05 mg/dL — ABNORMAL HIGH (ref 0.44–1.00)
GFR calc Af Amer: 27 mL/min — ABNORMAL LOW (ref >60)
GFR calc non Af Amer: 24 mL/min — ABNORMAL LOW (ref >60)
Glucose, Bld: 171 mg/dL — ABNORMAL HIGH (ref 70–99)
Potassium: 4.9 mmol/L (ref 3.5–5.1)
Sodium: 138 mmol/L (ref 135–145)

## 2019-02-22 NOTE — Progress Notes (Signed)
   CC: AKI   HPI:  [name] is a 73 y.o. female with PMHx listed below who presents for follow-up on AKI. Please see problem based charting for further details.   Past Medical History:  Diagnosis Date  . Bilateral lower extremity edema    right > left  . Bipolar 1 disorder ([place])   . CKD (chronic kidney disease) stage 3, GFR 30-59 ml/min (HCC)     Dr Lawson Radar, [facility]  . Diabetes, polyneuropathy ([place])    FEET AND FINGER TIPS  . Fibromyalgia   . GERD (gastroesophageal reflux disease)   . History of chronic gastritis   . Hyperlipidemia   . Hypertension    per pt was take off bp medication because of kidney disease told by her nephrologist  . Hypothyroidism   . Mixed incontinence urge and stress   . OA (osteoarthritis)    KNEES  . OAB (overactive bladder)   . PONV (postoperative nausea and vomiting)   . 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 Type 2 diabetes mellitus treated with insulin ([place])   . Wears glasses    Review of Systems:   Constitutional: negative for fevers, chills Cardio: negative for chest pain, palpitations Resp: negative for shortness of breath GI: negative for n/v, abdominal pain GU: negative for dysuria, hematuria Ext: negative for swelling above baseline   Physical Exam:  Vitals:   02/22/19 1319  BP: (!) 119/54  Pulse: 85  Temp: 98 F (36.7 C)  TempSrc: Oral  SpO2: 97%  Weight: 227 lb (103 kg)  Height: 5\' 9"  (1.753 m)   General: alert, well-appearing female in NAD CV: RRR; no m/r/g Pulm: normal work of breathing; lungs CTAB Abd: BS+; abdomen is soft, non-tender, non-distended Ext: no edema   Assessment & Plan:   See Encounters Tab for problem based charting.  Patient discussed with Dr. Evette Doffing

## 2019-02-22 NOTE — Progress Notes (Signed)
Internal Medicine Clinic Attending  Case discussed with Dr. Bloomfield at the time of the visit.  We reviewed the resident's history and exam and pertinent patient test results.  I agree with the assessment, diagnosis, and plan of care documented in the resident's note.  

## 2019-02-22 NOTE — Patient Instructions (Addendum)
Ms. Stanzione, It was great seeing you again! We discussed your labs and how your kidney function had shown some worsening since you were discharged from the hospital.  The good new is, it has already improved compared to yesterday.  Let's stop your Lisinopril for now. Continue to keep an eye on your blood pressure and make sure you are drinking plenty of fluids.   Let's bring you back in 2 weeks for repeat labs.   Take care, Dr. Koleen Distance

## 2019-02-22 NOTE — Assessment & Plan Note (Signed)
Patient doing well from a recovery standpoint. She is walking around her parking deck multiple times a day and performing home rehab exercises.  She has Percocet prescribed by orthopedic surgeon for severe pain which she says she rarely needs to take.  She will follow-up with ortho on 9/8.

## 2019-02-22 NOTE — Assessment & Plan Note (Signed)
Patient with CKD 3 and baseline creatinine of 1.3-1.4  recently admitted on 7/23 for acute on chronic renal failure. Etiology likely due to recent course bactrim. Creatinine on admission was 2.4 which improved by day of discharge to 1.3 after discontinuing antibiotics.  Repeat BMP today with worsening creatinine to 2.2 and potassium of 5.5.  Patient was called to come back in for repeat labs, EKG and thorough medication list review. She is on an ACE inhibitor which we will stop for now.

## 2019-02-23 ENCOUNTER — Encounter: Payer: Self-pay | Admitting: Internal Medicine

## 2019-02-23 LAB — URINALYSIS, COMPLETE
Bilirubin, UA: NEGATIVE
Glucose, UA: NEGATIVE
Ketones, UA: NEGATIVE
Nitrite, UA: NEGATIVE
Protein,UA: NEGATIVE
RBC, UA: NEGATIVE
Specific Gravity, UA: 1.021 (ref 1.005–1.030)
Urobilinogen, Ur: 0.2 mg/dL (ref 0.2–1.0)
pH, UA: 5 (ref 5.0–7.5)

## 2019-02-23 LAB — MICROSCOPIC EXAMINATION

## 2019-02-23 NOTE — Assessment & Plan Note (Addendum)
Patient returned for close follow-up due to worsening renal function on BMP and mild hyperkalemia. She has had no symptoms since yesterday. EKG today is unchanged from baseline. BMP with improved creatinine from 2.2>2.0 and potassium from 5.5>4.9. Patient brought in her medications as instructed. We will discontinue her Lisinopril for now. She states her blood pressures have been fairly soft at home in the last week, 90s/50s which she states is much lower than her normal. I suspect dehydration was likely the etiology of her worsening renal function which is supported by urine microscopy showing hyaline casts. She was encouraged to drink plenty of fluids and will have her return in 1-2 weeks for lab visit only to ensure renal function is stabilizing.

## 2019-02-26 NOTE — Progress Notes (Signed)
Internal Medicine Clinic Attending  Case discussed with Dr. Bloomfield at the time of the visit.  We reviewed the resident's history and exam and pertinent patient test results.  I agree with the assessment, diagnosis, and plan of care documented in the resident's note.  

## 2019-02-28 ENCOUNTER — Encounter: Payer: Self-pay | Admitting: Internal Medicine

## 2019-03-01 ENCOUNTER — Telehealth: Payer: Self-pay

## 2019-03-01 ENCOUNTER — Ambulatory Visit (INDEPENDENT_AMBULATORY_CARE_PROVIDER_SITE_OTHER): Payer: Medicare Other | Admitting: Internal Medicine

## 2019-03-01 ENCOUNTER — Other Ambulatory Visit: Payer: Self-pay

## 2019-03-01 VITALS — BP 151/60 | HR 87 | Temp 98.4°F | Ht 69.0 in | Wt 220.8 lb

## 2019-03-01 DIAGNOSIS — R300 Dysuria: Secondary | ICD-10-CM

## 2019-03-01 MED ORDER — CIPROFLOXACIN HCL 500 MG PO TABS: 500.0000 mg | ORAL_TABLET | Freq: Two times a day (BID) | ORAL | 0 refills | Status: AC

## 2019-03-01 NOTE — Telephone Encounter (Signed)
Thanks for the update. I agree

## 2019-03-01 NOTE — Telephone Encounter (Signed)
Requesting to speak with a nurse about UTI, please call pt back.

## 2019-03-01 NOTE — Telephone Encounter (Signed)
Pt calls and states she has a UTI, states she has some blood in her urine and it brings tears to her eyes it hurts so bad. States she has always been on a lowdose abx daily in the past for this issue. She does not want to go to urg care would like to see DR BLOOMFIELD because she is so good. She states she cannot wait til Monday. ACC 1545

## 2019-03-01 NOTE — Patient Instructions (Addendum)
Runa, It was great seeing you again! We discussed your urinary symptoms today which is most likely a UTI. I have sent in a prescription for an antibiotic to take twice daily for the next 7 days.   Please call if you develop fevers, chills, nausea or vomiting.   Otherwise, you can plan to follow-up with Dr. Gilford Rile next month!  Take care, Dr. Koleen Distance

## 2019-03-04 ENCOUNTER — Encounter: Payer: Self-pay | Admitting: Internal Medicine

## 2019-03-04 DIAGNOSIS — N390 Urinary tract infection, site not specified: Secondary | ICD-10-CM | POA: Insufficient documentation

## 2019-03-04 NOTE — Progress Notes (Signed)
Internal Medicine Clinic Attending  Case discussed with Dr. Bloomfield at the time of the visit.  We reviewed the resident's history and exam and pertinent patient test results.  I agree with the assessment, diagnosis, and plan of care documented in the resident's note.  

## 2019-03-04 NOTE — Progress Notes (Signed)
   CC: dysuria, hematuria   HPI:  [name] is a 73 y.o. female with PMHx listed below who presents for acute dysuria and hematuria beginning yesterday afternoon. Please see problem based charting for further details.   Past Medical History:  Diagnosis Date  . Bilateral lower extremity edema    right > left  . Bipolar 1 disorder ([place])   . CKD (chronic kidney disease) stage 3, GFR 30-59 ml/min (HCC)     Dr Lawson Radar, [facility]  . Diabetes, polyneuropathy ([place])    FEET AND FINGER TIPS  . Fibromyalgia   . GERD (gastroesophageal reflux disease)   . History of chronic gastritis   . Hyperlipidemia   . Hypertension    per pt was take off bp medication because of kidney disease told by her nephrologist  . Hypothyroidism   . Mixed incontinence urge and stress   . OA (osteoarthritis)    KNEES  . OAB (overactive bladder)   . PONV (postoperative nausea and vomiting)   . 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 Type 2 diabetes mellitus treated with insulin ([place])   . Wears glasses    Review of Systems:   Constitutional: negative for fevers, chills Cardiac: negative for chest pain, shortness of breath GI: negative for n/v, decreased appetite GU: negative for vaginal bleeding, pain or discharge   Physical Exam:  Vitals:   03/01/19 1549  BP: (!) 151/60  Pulse: 87  Temp: 98.4 F (36.9 C)  TempSrc: Oral  SpO2: 98%  Weight: 220 lb 12.8 oz (100.2 kg)  Height: 5\' 9"  (1.753 m)   General: alert, well-appearing female in NAD CV: RRR; no m/r/g Pulm: normal work of breathing; lungs CTAB Abd: abdomen is soft, non-distended. Suprapubic and RLQ tenderness to palpation.  MSK: right CVA tenderness   Assessment & Plan:   See Encounters Tab for problem based charting.  Patient discussed with Dr. Dareen Piano

## 2019-03-04 NOTE — Assessment & Plan Note (Signed)
Patient presents with 1 day of progressive dysuria and hematuria. Notes symptoms are similar to previous UTIs. No history of drug resistant organisms. She is afebrile and tolerating PO well.  She was not able to give urine sample today, but will treat empirically based on significant symptoms. Given significant right sided CVA tenderness on exam, will cover for possible mild pyelonephritis. Based on chart review she has tolerated Cipro in the past. Will treat with 7 day course of Cipro 500 mg BID.  - Cipro

## 2019-03-05 NOTE — Addendum Note (Signed)
Addended by: Modena Nunnery D on: 03/05/2019 08:30 AM   Modules accepted: Level of Service

## 2019-03-06 ENCOUNTER — Telehealth: Payer: Self-pay | Admitting: Orthopaedic Surgery

## 2019-03-06 NOTE — Telephone Encounter (Signed)
See below

## 2019-03-06 NOTE — Telephone Encounter (Signed)
Patient called asked if she can drive? Patient also asked if she should still be using a walker? Patient said she would like to do things on her own now and go to the bank by herself. The number to contact patient is [phone]

## 2019-03-06 NOTE — Telephone Encounter (Signed)
Yes she can drive as long as she feels comfortable doing it.  Her decision as to whether she feels that she still needs a walker or not.

## 2019-03-06 NOTE — Telephone Encounter (Signed)
Patient aware of the below message  

## 2019-03-08 ENCOUNTER — Encounter: Payer: Self-pay | Admitting: Internal Medicine

## 2019-03-08 ENCOUNTER — Ambulatory Visit (HOSPITAL_COMMUNITY)
Admission: RE | Admit: 2019-03-08 | Discharge: 2019-03-08 | Disposition: A | Discharge status: Home / Self Care | Payer: Medicare Other | Source: Ambulatory Visit | Attending: Internal Medicine | Admitting: Internal Medicine

## 2019-03-08 ENCOUNTER — Ambulatory Visit (INDEPENDENT_AMBULATORY_CARE_PROVIDER_SITE_OTHER): Payer: Medicare Other | Admitting: Internal Medicine

## 2019-03-08 ENCOUNTER — Other Ambulatory Visit: Payer: Self-pay

## 2019-03-08 ENCOUNTER — Ambulatory Visit: Payer: Medicare Other

## 2019-03-08 VITALS — BP 111/69 | HR 86 | Temp 98.3°F | Ht 69.0 in | Wt 222.3 lb

## 2019-03-08 DIAGNOSIS — D539 Nutritional anemia, unspecified: Secondary | ICD-10-CM | POA: Diagnosis not present

## 2019-03-08 DIAGNOSIS — N179 Acute kidney failure, unspecified: Secondary | ICD-10-CM | POA: Insufficient documentation

## 2019-03-08 DIAGNOSIS — M1712 Unilateral primary osteoarthritis, left knee: Secondary | ICD-10-CM

## 2019-03-08 LAB — BASIC METABOLIC PANEL
Anion gap: 9 (ref 5–15)
BUN: 26 mg/dL — ABNORMAL HIGH (ref 8–23)
CO2: 26 mmol/L (ref 22–32)
Calcium: 9.5 mg/dL (ref 8.9–10.3)
Chloride: 106 mmol/L (ref 98–111)
Creatinine, Ser: 1.57 mg/dL — ABNORMAL HIGH (ref 0.44–1.00)
GFR calc Af Amer: 38 mL/min — ABNORMAL LOW (ref >60)
GFR calc non Af Amer: 33 mL/min — ABNORMAL LOW (ref >60)
Glucose, Bld: 121 mg/dL — ABNORMAL HIGH (ref 70–99)
Potassium: 4.9 mmol/L (ref 3.5–5.1)
Sodium: 141 mmol/L (ref 135–145)

## 2019-03-08 LAB — CBC
HCT: 39.6 % (ref 36.0–46.0)
Hemoglobin: 12.4 g/dL (ref 12.0–15.0)
MCH: 32.7 pg (ref 26.0–34.0)
MCHC: 31.3 g/dL (ref 30.0–36.0)
MCV: 104.5 fL — ABNORMAL HIGH (ref 80.0–100.0)
Platelets: 327 10*3/uL (ref 150–400)
RBC: 3.79 MIL/uL — ABNORMAL LOW (ref 3.87–5.11)
RDW: 13.8 % (ref 11.5–15.5)
WBC: 6.1 10*3/uL (ref 4.0–10.5)
nRBC: 0 % (ref 0.0–0.2)

## 2019-03-08 LAB — RETICULOCYTES
Immature Retic Fract: 12.3 % (ref 2.3–15.9)
RBC.: 3.88 MIL/uL (ref 3.87–5.11)
Retic Count, Absolute: 59.8 10*3/uL (ref 19.0–186.0)
Retic Ct Pct: 1.5 % (ref 0.4–3.1)

## 2019-03-08 LAB — C-REACTIVE PROTEIN: CRP: <0.8 mg/dL (ref <1.0)

## 2019-03-08 LAB — VITAMIN B12: Vitamin B-12: 86 pg/mL — ABNORMAL LOW (ref 180–914)

## 2019-03-08 LAB — SEDIMENTATION RATE: Sed Rate: 25 mm/hr — ABNORMAL HIGH (ref 0–22)

## 2019-03-08 MED ORDER — DICLOFENAC SODIUM 1 % TD GEL: 2.0000 g | Freq: Four times a day (QID) | TRANSDERMAL | 1 refills | Status: DC

## 2019-03-08 NOTE — Progress Notes (Signed)
   CC: Acute on chronic kidney injury and left knee pain  HPI:  Toni Hill is a 73 y.o. with essential htn, dm2, bipolar 1 disorder, ckd3, primary osteoarthritis of left knee s/p left knee arthroplasty 01/15/2019 who presents for follow up regarding follow up for acute on chronic kidney injury and left knee pain. Please see problem based charting for evaluation, assessment, and plan.  Past Medical History:  Diagnosis Date  . Bilateral lower extremity edema    right > left  . Bipolar 1 disorder ([place])   . CKD (chronic kidney disease) stage 3, GFR 30-59 ml/min (HCC)     Dr Lawson Radar, [facility]  . Diabetes, polyneuropathy ([place])    FEET AND FINGER TIPS  . Fibromyalgia   . GERD (gastroesophageal reflux disease)   . History of chronic gastritis   . Hyperlipidemia   . Hypertension    per pt was take off bp medication because of kidney disease told by her nephrologist  . Hypothyroidism   . Mixed incontinence urge and stress   . OA (osteoarthritis)    KNEES  . OAB (overactive bladder)   . PONV (postoperative nausea and vomiting)   . 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 Type 2 diabetes mellitus treated with insulin ([place])   . Wears glasses    Review of Systems:    Review of Systems  Constitutional: Negative for chills and fever.  Respiratory: Positive for shortness of breath (for past 1 year). Negative for cough.   Cardiovascular: Negative for chest pain.  Gastrointestinal: Negative for abdominal pain, nausea and vomiting.  Musculoskeletal: Positive for joint pain (left knee).  Neurological: Negative for dizziness and headaches.   Physical Exam:  Vitals:   03/08/19 1055  BP: 111/69  Pulse: 86  Temp: 98.3 F (36.8 C)  TempSrc: Oral  SpO2: 98%  Weight: 222 lb 4.8 oz (100.8 kg)  Height: 5\' 9"  (1.753 m)   Physical Exam  Constitutional: She is oriented to person, place, and time. She appears well-developed and well-nourished. No distress.  HENT:   Head: Normocephalic and atraumatic.  Eyes: Conjunctivae are normal.  Cardiovascular: Normal rate, regular rhythm and normal heart sounds.  Respiratory: Effort normal and breath sounds normal. No respiratory distress. She has no wheezes.  GI: Soft. Bowel sounds are normal. She exhibits no distension. There is no abdominal tenderness.  Musculoskeletal:        General: Tenderness (anterior left knee ) and edema (left knee) present. No deformity.     Comments: Left knee with limited from to 30 degrees from ground. Erythema and mild amount of effusion palpable. Tenderness to palpation especially notable in superior aspect of left knee.   Neurological: She is alert and oriented to person, place, and time.  Skin: She is not diaphoretic. There is erythema.  Psychiatric: She has a normal mood and affect. Her behavior is normal. Judgment and thought content normal.    Assessment & Plan:   See Encounters Tab for problem based charting.  Patient discussed with Dr. Philipp Ovens

## 2019-03-08 NOTE — Assessment & Plan Note (Signed)
The patient has a worsening of kidney function thought to be secondary to recent double strength Bactrim use.  The patient has a baseline creatinine ranging 1.3-1.4.  Last BMP done 02/22/2019 was 2.05.  Will obtain repeat BMP during this visit to ensure that the patient has improving kidney function.  -bmp

## 2019-03-08 NOTE — Assessment & Plan Note (Signed)
Patient has been having a macrocytic anemia since July 2020. Patient does not drink alcohol.   -B12 -Folate -blood smear

## 2019-03-08 NOTE — Assessment & Plan Note (Addendum)
  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  Patient developed fever and cellulitis of left knee while on Bactrim so this was stopped on 01/22/2019 and she was started on doxycycline 100 mg twice daily for 7 days. Patient was subsequently admitted from 7/21-7/28 for tender left calf, low grade fever, and elevated d-dimer. Lower extremity dopplers were negative and thought to have low likelihood of having PE. Patient was started on NOAC (eliquis). Patient was seen by orthopedics Dr. XErlinda Hongon 02/08/19 and was deemed to be doing well and was switched off of her eliquis to aspirin 898mbid.   Assessment and plan  The patient was evaluated for possible prosthetic joint infection, fracture, malpositioning of hardware.   -cbc-without leukocytosis, afebrile  -crp, esr wnl -bmp -xray of left knee- showing mild soft tissue swelling present in anterior compartment of left knee. No malpositioning of hardware, fracture, or dislocation. -Called and verified with on call orthopedic pa michael jeffrey who mentioned that she can be sent home with supportive theerapy. Patient to follow with orthopedics on 9/8 as scheduled -nsaids for pain control  -voltaren gel

## 2019-03-08 NOTE — Patient Instructions (Signed)
It was a pleasure to see you today Ms. Kolden. Please make the following changes:  I am sorry to hear that you are having pain in your left knee. It appears to be due to overuse of your left knee. Please remember to use a walker at all times and do your physical therapy exercises sparingly. Please follow up with orthopedics on 03/13/19.  If you have any questions or concerns, please call our clinic at [phone] between 9am-5pm and after hours call [phone] and ask for the internal medicine resident on call. If you feel you are having a medical emergency please call 911.   Thank you, we look forward to help you remain healthy!  Lars Mage, MD Internal Medicine PGY3

## 2019-03-08 NOTE — Progress Notes (Signed)
Internal Medicine Clinic Attending  Case discussed with Dr. Chundi at the time of the visit.  We reviewed the resident's history and exam and pertinent patient test results.  I agree with the assessment, diagnosis, and plan of care documented in the resident's note. 

## 2019-03-09 LAB — FOLATE RBC
Folate, Hemolysate: 271 ng/mL
Folate, RBC: 695 ng/mL (ref >498)
Hematocrit: 39 % (ref 34.0–46.6)

## 2019-03-09 LAB — PATHOLOGIST SMEAR REVIEW

## 2019-03-12 ENCOUNTER — Other Ambulatory Visit: Payer: Self-pay | Admitting: Orthopaedic Surgery

## 2019-03-13 ENCOUNTER — Ambulatory Visit (INDEPENDENT_AMBULATORY_CARE_PROVIDER_SITE_OTHER): Payer: Medicare Other | Admitting: Physician Assistant

## 2019-03-13 ENCOUNTER — Encounter: Payer: Self-pay | Admitting: Orthopaedic Surgery

## 2019-03-13 ENCOUNTER — Telehealth: Payer: Self-pay | Admitting: Orthopaedic Surgery

## 2019-03-13 ENCOUNTER — Ambulatory Visit (INDEPENDENT_AMBULATORY_CARE_PROVIDER_SITE_OTHER): Payer: Medicare Other

## 2019-03-13 DIAGNOSIS — Z96652 Presence of left artificial knee joint: Secondary | ICD-10-CM

## 2019-03-13 NOTE — Telephone Encounter (Signed)
appt scheduled

## 2019-03-13 NOTE — Telephone Encounter (Signed)
Patient left a voicemail stating that her grandson fell in [employer]E[employer] today and hurt his wrist and wanted to know if she could get him seen by Mendel Ryder at the same time she is seeing Dr. Erlinda Hong.  CB#[phone].  Thank you.

## 2019-03-13 NOTE — Telephone Encounter (Signed)
Ok to rf? 

## 2019-03-13 NOTE — Telephone Encounter (Signed)
sure

## 2019-03-13 NOTE — Progress Notes (Signed)
Post-Op Visit Note   Patient: Toni Hill           Date of Birth: 03/10/1946           MRN: [id number] Visit Date: 03/13/2019 PCP: Maudie Mercury, MD   Assessment & Plan:  Chief Complaint:  Chief Complaint  Patient presents with  . Left Knee - Routine Post Op   Visit Diagnoses:  1. Status post left knee replacement     Plan: Patient is a pleasant 73 year old female who presents our clinic today 8 weeks status post left total knee replacement, date of surgery 01/15/2019.  She has been doing well.  She has been [place] this on her own.  She notes increased pain to the left knee as she has been ambulating more without her walker.  No fevers or chills.  Examination of her left knee reveals a fully healed surgical scar without evidence of infection or cellulitis.  She does have moderate tenderness over the pes bursa.  Range of motion 5 degrees of extension lag to approximately 110 degrees of flexion.  She is stable valgus varus stress.  She is neurovascular intact distally.  At this point, she will continue with her home exercise program.  Dental prophylaxis reinforced.  Follow-up with [place] in 4 weeks time when she is 3 months out from surgery.  Call with concerns or questions in the meantime.  Follow-Up Instructions: Return in about 4 weeks (around 04/10/2019).   Orders:  Orders Placed This Encounter  Procedures  . XR Knee 1-2 Views Left   No orders of the defined types were placed in this encounter.   Imaging: Xr Knee 1-2 Views Left  Result Date: 03/13/2019 X-rays demonstrate a well-seated prosthesis without complication   PMFS History: Patient Active Problem List   Diagnosis Date Noted  . Macrocytic anemia 03/08/2019  . Dysuria 03/04/2019  . Pain of left calf 01/26/2019  . AKI (acute kidney injury) ([place]) 01/25/2019  . Leg swelling 01/25/2019  . QT prolongation 01/25/2019  . Elevated d-dimer 01/23/2019  . Acute blood loss as cause of postoperative anemia 01/23/2019   . Cellulitis of leg, left 01/23/2019  . At risk for adverse drug event 01/22/2019  . Bipolar 1 disorder ([place])   . 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 Osteoarthritis of left knee 12/30/2018  . Anxiety 12/30/2018  . Stage 3 chronic kidney disease ([place]) 06/28/2016  . Hypercholesterolemia 08/20/2015  . Type 2 diabetes mellitus ([place]) 02/28/2014  . Esophageal reflux 02/28/2014  . Stress incontinence, female 02/28/2014  . Acquired hypothyroidism 02/28/2014  . Essential hypertension 11/17/2011   Past Medical History:  Diagnosis Date  . Bilateral lower extremity edema    right > left  . Bipolar 1 disorder ([place])   . CKD (chronic kidney disease) stage 3, GFR 30-59 ml/min (HCC)     Dr Lawson Radar, [facility]  . Diabetes, polyneuropathy ([place])    FEET AND FINGER TIPS  . Fibromyalgia   . GERD (gastroesophageal reflux disease)   . History of chronic gastritis   . Hyperlipidemia   . Hypertension    per pt was take off bp medication because of kidney disease told by her nephrologist  . Hypothyroidism   . Mixed incontinence urge and stress   . OA (osteoarthritis)    KNEES  . OAB (overactive bladder)   . PONV (postoperative nausea and vomiting)   . 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 Type 2  diabetes mellitus treated with insulin ([place])   . Wears glasses     Family History  Problem Relation Age of Onset  . Breast cancer Mother 17  . Cancer Father        ? type  . Breast cancer Sister 43  . Breast cancer Maternal Grandmother        ? age    Past Surgical History:  Procedure Laterality Date  . CHOLECYSTECTOMY OPEN  1990's  . COLONOSCOPY  last one 07-17-2014  . CYSTOSCOPY WITH INJECTION N/A 10/29/2016   Procedure: CYSTOSCOPY WITH INJECTION BOTOX 100 UNITS, urethral dilation;  Surgeon: Carolan Clines, MD;  Location: [facility];  Service: Urology;  Laterality: N/A;  . ESOPHAGOGASTRODUODENOSCOPY  last one 07-01-2016  . EXCISIONAL  BREAST BX  1990   BENIGN  . ORIF TIBIA & FIBULA FRACTURES  2010   retained rod  . TONSILLECTOMY  age 42  . TOTAL KNEE ARTHROPLASTY Left 01/15/2019   Procedure: LEFT TOTAL KNEE ARTHROPLASTY;  Surgeon: Leandrew Koyanagi, MD;  Location: [facility];  Service: Orthopedics;  Laterality: Left;  Marland Kitchen VAGINAL HYSTERECTOMY  1979  . VENTRAL HERNIA REPAIR  1990's   Social History   Occupational History  . Not on file  Tobacco Use  . Smoking status: Never Smoker  . Smokeless tobacco: Never Used  Substance and Sexual Activity  . Alcohol use: No  . Drug use: No  . Sexual activity: Not Currently    Comment: 1st intercourse 73 yo-Fewer than 5 partners

## 2019-03-14 ENCOUNTER — Telehealth: Payer: Self-pay | Admitting: Physician Assistant

## 2019-03-14 NOTE — Telephone Encounter (Signed)
pls advise. Thanks.  

## 2019-03-14 NOTE — Telephone Encounter (Signed)
Patient called needing Rx for pain (Hydrocodone) Patient uses the [facility] in [place]. The number to contact patient is [phone]

## 2019-03-15 ENCOUNTER — Other Ambulatory Visit: Payer: Self-pay | Admitting: Physician Assistant

## 2019-03-15 ENCOUNTER — Other Ambulatory Visit (INDEPENDENT_AMBULATORY_CARE_PROVIDER_SITE_OTHER): Payer: Self-pay | Admitting: Orthopaedic Surgery

## 2019-03-15 MED ORDER — HYDROCODONE-ACETAMINOPHEN 5-325 MG PO TABS: 1.0000 | ORAL_TABLET | Freq: Every day | ORAL | 0 refills | Status: DC | PRN

## 2019-03-15 NOTE — Telephone Encounter (Signed)
Just sent in

## 2019-03-15 NOTE — Telephone Encounter (Signed)
Ok to refill 

## 2019-03-15 NOTE — Telephone Encounter (Signed)
Tried calling to advise.  ?

## 2019-03-15 NOTE — Telephone Encounter (Signed)
Please advise 

## 2019-04-03 ENCOUNTER — Other Ambulatory Visit: Payer: Self-pay

## 2019-04-03 ENCOUNTER — Encounter: Payer: Self-pay | Admitting: Internal Medicine

## 2019-04-03 ENCOUNTER — Ambulatory Visit (INDEPENDENT_AMBULATORY_CARE_PROVIDER_SITE_OTHER): Payer: Medicare Other | Admitting: Internal Medicine

## 2019-04-03 VITALS — BP 131/52 | HR 90 | Temp 98.0°F | Resp 96 | Ht 69.0 in | Wt 221.0 lb

## 2019-04-03 DIAGNOSIS — N184 Chronic kidney disease, stage 4 (severe): Secondary | ICD-10-CM | POA: Diagnosis not present

## 2019-04-03 DIAGNOSIS — E1122 Type 2 diabetes mellitus with diabetic chronic kidney disease: Secondary | ICD-10-CM | POA: Diagnosis not present

## 2019-04-03 DIAGNOSIS — E232 Diabetes insipidus: Secondary | ICD-10-CM

## 2019-04-03 DIAGNOSIS — Z794 Long term (current) use of insulin: Secondary | ICD-10-CM | POA: Diagnosis not present

## 2019-04-03 DIAGNOSIS — R300 Dysuria: Secondary | ICD-10-CM

## 2019-04-03 DIAGNOSIS — E039 Hypothyroidism, unspecified: Secondary | ICD-10-CM

## 2019-04-03 DIAGNOSIS — I129 Hypertensive chronic kidney disease with stage 1 through stage 4 chronic kidney disease, or unspecified chronic kidney disease: Secondary | ICD-10-CM

## 2019-04-03 DIAGNOSIS — E0800 Diabetes mellitus due to underlying condition with hyperosmolarity without nonketotic hyperglycemic-hyperosmolar coma (NKHHC): Secondary | ICD-10-CM

## 2019-04-03 DIAGNOSIS — Z23 Encounter for immunization: Secondary | ICD-10-CM | POA: Diagnosis not present

## 2019-04-03 DIAGNOSIS — E1140 Type 2 diabetes mellitus with diabetic neuropathy, unspecified: Secondary | ICD-10-CM

## 2019-04-03 DIAGNOSIS — N390 Urinary tract infection, site not specified: Secondary | ICD-10-CM | POA: Diagnosis not present

## 2019-04-03 LAB — POCT URINALYSIS DIPSTICK
Bilirubin, UA: NEGATIVE
Blood, UA: NEGATIVE
Glucose, UA: NEGATIVE
Ketones, UA: NEGATIVE
Nitrite, UA: NEGATIVE
Protein, UA: POSITIVE — AB
Spec Grav, UA: 1.025 (ref 1.010–1.025)
Urobilinogen, UA: 0.2 E.U./dL
pH, UA: 5 (ref 5.0–8.0)

## 2019-04-03 LAB — POCT GLYCOSYLATED HEMOGLOBIN (HGB A1C): Hemoglobin A1C: 7.5 % — AB (ref 4.0–5.6)

## 2019-04-03 LAB — GLUCOSE, CAPILLARY: Glucose-Capillary: 113 mg/dL — ABNORMAL HIGH (ref 70–99)

## 2019-04-03 MED ORDER — NITROFURANTOIN MONOHYD MACRO 100 MG PO CAPS: 100.0000 mg | ORAL_CAPSULE | Freq: Two times a day (BID) | ORAL | 0 refills | Status: AC

## 2019-04-03 NOTE — Patient Instructions (Signed)
It was a pleasure to see you again, Toni Hill. Today we discussed your urinary symptoms. We are prescribing you an antibiotic for your urinary tract infection. Please follow the instructions as labelled on the bottle. Please call if after treatment your symptoms persist. When your labs come back, I will call you if your culture is resistant to your medication. I look forward to working with you again!

## 2019-04-03 NOTE — Assessment & Plan Note (Signed)
Today we spoke about Toni Hill diabetes regime. She states that she has been taking her medications as directed and has been adhering to her medications. She asked if her A1c was "too high" but her reading of 7.5 fits within her goals of care. We discussed the benefits and detriments of high and low blood sugar counts, including hypoglycemic events. - Continue medications as prescribed.  - Patient is to bring her glucose readings at next visit.

## 2019-04-03 NOTE — Progress Notes (Signed)
Toni Hill

## 2019-04-03 NOTE — Progress Notes (Signed)
   CC: Urinary frequency  HPI:  [name], with a PMH of DM, HTN, Hypothyroidism, and CKD, who presents with urinary frequency. To see the assessment and plan for her problem based assessment, please visit the encounters tab.    Past Medical History:  Diagnosis Date  . Bilateral lower extremity edema    right > left  . Bipolar 1 disorder ([place])   . CKD (chronic kidney disease) stage 3, GFR 30-59 ml/min (HCC)     Dr Lawson Radar, [facility]  . Diabetes, polyneuropathy ([place])    FEET AND FINGER TIPS  . Fibromyalgia   . GERD (gastroesophageal reflux disease)   . History of chronic gastritis   . Hyperlipidemia   . Hypertension    per pt was take off bp medication because of kidney disease told by her nephrologist  . Hypothyroidism   . Mixed incontinence urge and stress   . OA (osteoarthritis)    KNEES  . OAB (overactive bladder)   . PONV (postoperative nausea and vomiting)   . 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 Type 2 diabetes mellitus treated with insulin ([place])   . Wears glasses    Review of Systems:   Review of Systems  Constitutional: Negative for chills and fever.  Eyes: Negative for blurred vision and redness.  Respiratory: Negative for cough.   Gastrointestinal: Negative for abdominal pain, nausea and vomiting.  Genitourinary: Positive for dysuria, frequency and urgency. Negative for flank pain and hematuria.  Skin: Negative for itching.  Neurological: Negative for dizziness and headaches.    Physical Exam:  Vitals:   04/03/19 1540  BP: (!) 131/52  Pulse: 90  Resp: (!) 96  Temp: 98 F (36.7 C)  TempSrc: Oral  Weight: 221 lb (100.2 kg)  Height: 5\' 9"  (1.753 m)   Physical Exam HENT:     Head: Normocephalic and atraumatic.  Cardiovascular:     Rate and Rhythm: Normal rate and regular rhythm.     Pulses: Normal pulses.     Heart sounds: Normal heart sounds. No murmur. No friction rub. No gallop.   Pulmonary:   Effort: Pulmonary effort is normal.     Breath sounds: Normal breath sounds. No wheezing, rhonchi or rales.     Comments: Respiratory Rate: 16  Abdominal:     General: Bowel sounds are normal.     Palpations: Abdomen is soft.     Tenderness: There is no abdominal tenderness.  Neurological:     Mental Status: She is alert and oriented to person, place, and time.  Psychiatric:        Behavior: Behavior normal.     Assessment & Plan:   See Encounters Tab for problem based charting.   Patient seen with Dr. Philipp Ovens

## 2019-04-03 NOTE — Assessment & Plan Note (Addendum)
  Mrs. Lory presents to the clinic with 7 days of urinary discomfort. She describes this sensation as a "pressure." She states that she has had an increase in her urination. She also endorses nocturia. She states that she feels like she is retaining urine, and notices increased dribbling. She states that she has had multiple urinary tract infections before, and that this "pressure" typically is the beginning of the UTI.   Her POCT was significant for protein, small amounts of leukocytes, its hazy appearance, and strong odor. Based off of Toni Hill her past U/A and cultures showed sensitivity to nitrofurantoin.  Plan:  - Urinary analysis with culture ordered.  - Ordered Macrobid for UTI. - Will follow up with patient pending culture sensitivities.

## 2019-04-04 ENCOUNTER — Telehealth: Payer: Self-pay | Admitting: Internal Medicine

## 2019-04-04 ENCOUNTER — Other Ambulatory Visit: Payer: Self-pay | Admitting: Physician Assistant

## 2019-04-04 ENCOUNTER — Telehealth: Payer: Self-pay | Admitting: Orthopaedic Surgery

## 2019-04-04 LAB — URINALYSIS, COMPLETE
Bilirubin, UA: NEGATIVE
Glucose, UA: NEGATIVE
Ketones, UA: NEGATIVE
Nitrite, UA: NEGATIVE
RBC, UA: NEGATIVE
Specific Gravity, UA: 1.028 (ref 1.005–1.030)
Urobilinogen, Ur: 0.2 mg/dL (ref 0.2–1.0)
pH, UA: 5 (ref 5.0–7.5)

## 2019-04-04 LAB — MICROSCOPIC EXAMINATION

## 2019-04-04 MED ORDER — HYDROCODONE-ACETAMINOPHEN 5-325 MG PO TABS: 1.0000 | ORAL_TABLET | Freq: Every day | ORAL | 0 refills | Status: DC | PRN

## 2019-04-04 NOTE — Progress Notes (Addendum)
Internal Medicine Clinic Attending  I saw and evaluated the patient.  I personally confirmed the key portions of the history and exam documented by Dr. Gilford Rile and I reviewed pertinent patient test results.  The assessment, diagnosis, and plan were formulated together and I agree with the documentation in the resident's note.    Patient with history of insulin dependant type II DM since the 1990s. She has been on the same insulin regimen for years. Hgb A1c today is 7.5, stable from 7.3 three months ago. She does not have her meter today. Given her age, a higher target hgb A1c of < 8 may be reasonable. Therapeutic options are limited with her CKD 4. Will hold off on changing insulin regimen today since we do not have her glucometer data. Follow up 3 months with glucometer; consider increasing regimen at next visit.

## 2019-04-04 NOTE — Telephone Encounter (Signed)
Pt states she was seen yesterday by pcp for UTI-she picked up macrobid from the pharmacy, but what she "really wanted" was the pill that turns her urine a different color (Phenazopyridine?). Does not want otc brand secondary to price.  Pt advised to take antibiotic as instructed, but states MD informed her of the "other medication" and not the antibiotic.  Will have pcp contact pt with urine results and further instructions on macrobid and address rx for Phenazopyridine (Pyridium).  Please advise.Toni Hidden Cassady9/30/202010:43 AM

## 2019-04-04 NOTE — Addendum Note (Signed)
Addended by: Jodean Lima on: 04/04/2019 08:13 AM   Modules accepted: Level of Service

## 2019-04-04 NOTE — Telephone Encounter (Signed)
See message below °

## 2019-04-04 NOTE — Telephone Encounter (Signed)
Patient called. Says she is out of pain medicine. Would like hydrocodone called in. Her call back number is [phone]

## 2019-04-04 NOTE — Telephone Encounter (Signed)
Patient seen yesterday concerned about the medicine nitrofurantoin, macrocrystal-monohydrate, (MACROBID) 100 MG capsule that was prescribed and has questions.  Please call the patient back.

## 2019-04-04 NOTE — Telephone Encounter (Signed)
To all,   I hope you are well. I spoke with Dorthey Hill about her medications today at 1235. We went over the difference between Pyridium and Nitrofurantoin as well as the changes in urinary color. I emphasized needing to take her antibiotic to clear her UTI. Patient sounded amendable to the plan. All questions and concerns were addressed. Thank you for reaching out to me in regards to Toni Hill. Have a good day! Sincerely,  Maudie Mercury

## 2019-04-04 NOTE — Telephone Encounter (Signed)
Last rx.  Use sparingly

## 2019-04-06 LAB — URINE CULTURE

## 2019-04-06 NOTE — Telephone Encounter (Signed)
CALLED INTO PHARM  

## 2019-04-10 ENCOUNTER — Ambulatory Visit (INDEPENDENT_AMBULATORY_CARE_PROVIDER_SITE_OTHER): Payer: Medicare Other | Admitting: Orthopaedic Surgery

## 2019-04-10 DIAGNOSIS — Z96652 Presence of left artificial knee joint: Secondary | ICD-10-CM

## 2019-04-10 MED ORDER — HYDROCODONE-ACETAMINOPHEN 5-325 MG PO TABS: 1.0000 | ORAL_TABLET | Freq: Every day | ORAL | 0 refills | Status: DC | PRN

## 2019-04-10 NOTE — Progress Notes (Signed)
Post-Op Visit Note   Patient: Toni Hill           Date of Birth: 1946/01/23           MRN: [id number] Visit Date: 04/10/2019 PCP: Maudie Mercury, MD   Assessment & Plan:  Chief Complaint:  Chief Complaint  Patient presents with  . Left Knee - Pain, Follow-up   Visit Diagnoses:  1. Status post total left knee replacement     Plan: Manasvini is 3 months status post left total knee replacement.  She is doing well and she has some mild pain.  She takes hydrocodone daily for the pain mainly at night.  She has no complaints otherwise.  She is happy about her knee replacement.  Physical exam shows a fully healed surgical scar.  There is slight purplish hue around the scar which is symmetric to her right knee over the surgical scar.  She has excellent range of motion.  Collaterals are stable.  From my standpoint the patient is doing very well.  She should continue with home exercises for strengthening.  I refilled her Norco but she understands this will be the last one.  She will need additional refills from either her PCP or referral to a pain clinic.  I did offer tramadol and NSAIDs but she declined the tramadol and her chronic kidney disease precludes her from taking NSAIDs.  I will see her back in 3 months with 2 view x-rays of the left knee for her 10-month follow-up.  Dental prophylaxis was reinforced.  Follow-Up Instructions: Return in about 3 months (around 07/11/2019).   Orders:  No orders of the defined types were placed in this encounter.  Meds ordered this encounter  Medications  . HYDROcodone-acetaminophen (NORCO) 5-325 MG tablet    Sig: Take 1-2 tablets by mouth daily as needed.    Dispense:  20 tablet    Refill:  0    Imaging: No results found.  PMFS History: Patient Active Problem List   Diagnosis Date Noted  . Macrocytic anemia 03/08/2019  . UTI (urinary tract infection) 03/04/2019  . Pain of left calf 01/26/2019  . AKI (acute kidney injury) ([place]) 01/25/2019   . Leg swelling 01/25/2019  . QT prolongation 01/25/2019  . Elevated d-dimer 01/23/2019  . Acute blood loss as cause of postoperative anemia 01/23/2019  . Cellulitis of leg, left 01/23/2019  . At risk for adverse drug event 01/22/2019  . Bipolar 1 disorder ([place])   . 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 Osteoarthritis of left knee 12/30/2018  . Anxiety 12/30/2018  . Stage 3 chronic kidney disease 06/28/2016  . Hypercholesterolemia 08/20/2015  . Type 2 diabetes mellitus ([place]) 02/28/2014  . Esophageal reflux 02/28/2014  . Stress incontinence, female 02/28/2014  . Acquired hypothyroidism 02/28/2014  . Essential hypertension 11/17/2011   Past Medical History:  Diagnosis Date  . Bilateral lower extremity edema    right > left  . Bipolar 1 disorder ([place])   . CKD (chronic kidney disease) stage 3, GFR 30-59 ml/min (HCC)     Dr Lawson Radar, [facility]  . Diabetes, polyneuropathy ([place])    FEET AND FINGER TIPS  . Fibromyalgia   . GERD (gastroesophageal reflux disease)   . History of chronic gastritis   . Hyperlipidemia   . Hypertension    per pt was take off bp medication because of kidney disease told by her nephrologist  . Hypothyroidism   . Mixed incontinence urge and stress   .  OA (osteoarthritis)    KNEES  . OAB (overactive bladder)   . PONV (postoperative nausea and vomiting)   . 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 Type 2 diabetes mellitus treated with insulin ([place])   . Wears glasses     Family History  Problem Relation Age of Onset  . Breast cancer Mother 6  . Cancer Father        ? type  . Breast cancer Sister 45  . Breast cancer Maternal Grandmother        ? age    Past Surgical History:  Procedure Laterality Date  . CHOLECYSTECTOMY OPEN  1990's  . COLONOSCOPY  last one 07-17-2014  . CYSTOSCOPY WITH INJECTION N/A 10/29/2016   Procedure: CYSTOSCOPY WITH INJECTION BOTOX 100 UNITS, urethral dilation;  Surgeon: Carolan Clines,  MD;  Location: [facility];  Service: Urology;  Laterality: N/A;  . ESOPHAGOGASTRODUODENOSCOPY  last one 07-01-2016  . EXCISIONAL BREAST BX  1990   BENIGN  . ORIF TIBIA & FIBULA FRACTURES  2010   retained rod  . TONSILLECTOMY  age 42  . TOTAL KNEE ARTHROPLASTY Left 01/15/2019   Procedure: LEFT TOTAL KNEE ARTHROPLASTY;  Surgeon: Leandrew Koyanagi, MD;  Location: [facility];  Service: Orthopedics;  Laterality: Left;  Marland Kitchen VAGINAL HYSTERECTOMY  1979  . VENTRAL HERNIA REPAIR  1990's   Social History   Occupational History  . Not on file  Tobacco Use  . Smoking status: Never Smoker  . Smokeless tobacco: Never Used  Substance and Sexual Activity  . Alcohol use: No  . Drug use: No  . Sexual activity: Not Currently    Comment: 1st intercourse 73 yo-Fewer than 5 partners

## 2019-04-11 ENCOUNTER — Encounter: Payer: Self-pay | Admitting: Gynecology

## 2019-04-12 ENCOUNTER — Encounter: Payer: Self-pay | Admitting: Internal Medicine

## 2019-04-20 ENCOUNTER — Other Ambulatory Visit: Payer: Self-pay | Admitting: Physician Assistant

## 2019-04-27 ENCOUNTER — Other Ambulatory Visit: Payer: Self-pay | Admitting: Internal Medicine

## 2019-05-01 ENCOUNTER — Ambulatory Visit: Payer: Medicare Other | Admitting: Family Medicine

## 2019-05-02 ENCOUNTER — Ambulatory Visit: Payer: Medicare Other | Admitting: Family Medicine

## 2019-05-04 ENCOUNTER — Encounter: Payer: Self-pay | Admitting: Family Medicine

## 2019-05-04 ENCOUNTER — Other Ambulatory Visit: Payer: Self-pay

## 2019-05-04 ENCOUNTER — Ambulatory Visit (INDEPENDENT_AMBULATORY_CARE_PROVIDER_SITE_OTHER): Payer: Medicare Other | Admitting: Family Medicine

## 2019-05-04 DIAGNOSIS — M545 Low back pain, unspecified: Secondary | ICD-10-CM

## 2019-05-04 DIAGNOSIS — G8929 Other chronic pain: Secondary | ICD-10-CM | POA: Diagnosis not present

## 2019-05-04 MED ORDER — BACLOFEN 10 MG PO TABS: 5.0000 mg | ORAL_TABLET | Freq: Three times a day (TID) | ORAL | 3 refills | Status: DC | PRN

## 2019-05-04 NOTE — Progress Notes (Signed)
Office Visit Note   Patient: Toni Hill           Date of Birth: 10/11/1945           MRN: [id number] Visit Date: 05/04/2019 Requested by: Maudie Mercury, MD [address] [place],  [place] [address] PCP: Maudie Mercury, MD  Subjective: Chief Complaint  Patient presents with  . Lower Back - Pain    Pain in left lower back over to the middle of her lower back. Left leg hurts, but she is unsure if that is coming from her left knee that was recently replaced, or from her back.    HPI: She is here with left-sided low back pain.  Symptoms started quite a while ago, she had an MRI scan in April showing multiple disc protrusions but mostly right-sided findings.  Her pain is in the left flank area and does not radiate down the leg.  She is 3 months out from left total knee replacement and is still having trouble with that.  Denies any groin pain.  She is using hydrocodone and Robaxin with no improvement.               ROS: No fevers or chills.  No bowel or bladder dysfunction.  All other systems were reviewed and are negative.  Objective: Vital Signs: There were no vitals taken for this visit.  Physical Exam:  General:  Alert and oriented, in no acute distress. Pulm:  Breathing unlabored. Psy:  Normal mood, congruent affect. Skin: No visible rash. Low back: No significant tenderness over the spinous processes or the SI joints.  She is tender in the paraspinous muscles to the left of midline in the mid to lower lumbar spine.  Negative straight leg raise, lower extremity strength is normal.   Imaging: None today.  Assessment & Plan: 1.  Mechanical left-sided low back pain -Patient has responded well to epidural injections in the past and she would like to be referred for 1.  We will try baclofen instead of Robaxin.  Follow-up as needed.     Procedures: No procedures performed  No notes on file     PMFS History: Patient Active Problem List   Diagnosis Date  Noted  . Macrocytic anemia 03/08/2019  . UTI (urinary tract infection) 03/04/2019  . Pain of left calf 01/26/2019  . AKI (acute kidney injury) ([place]) 01/25/2019  . Leg swelling 01/25/2019  . QT prolongation 01/25/2019  . Elevated d-dimer 01/23/2019  . Acute blood loss as cause of postoperative anemia 01/23/2019  . Cellulitis of leg, left 01/23/2019  . At risk for adverse drug event 01/22/2019  . Bipolar 1 disorder ([place])   . 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 Osteoarthritis of left knee 12/30/2018  . Anxiety 12/30/2018  . Stage 3 chronic kidney disease 06/28/2016  . Hypercholesterolemia 08/20/2015  . Type 2 diabetes mellitus ([place]) 02/28/2014  . Esophageal reflux 02/28/2014  . Stress incontinence, female 02/28/2014  . Acquired hypothyroidism 02/28/2014  . Essential hypertension 11/17/2011   Past Medical History:  Diagnosis Date  . Bilateral lower extremity edema    right > left  . Bipolar 1 disorder ([place])   . CKD (chronic kidney disease) stage 3, GFR 30-59 ml/min     Dr Lawson Radar, [facility]  . Diabetes, polyneuropathy ([place])    FEET AND FINGER TIPS  . Fibromyalgia   . GERD (gastroesophageal reflux disease)   . History of chronic gastritis   .  Hyperlipidemia   . Hypertension    per pt was take off bp medication because of kidney disease told by her nephrologist  . Hypothyroidism   . Mixed incontinence urge and stress   . OA (osteoarthritis)    KNEES  . OAB (overactive bladder)   . PONV (postoperative nausea and vomiting)   . 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 Type 2 diabetes mellitus treated with insulin ([place])   . Wears glasses     Family History  Problem Relation Age of Onset  . Breast cancer Mother 53  . Cancer Father        ? type  . Breast cancer Sister 4  . Breast cancer Maternal Grandmother        ? age    Past Surgical History:  Procedure Laterality Date  . CHOLECYSTECTOMY OPEN  1990's  . COLONOSCOPY  last one  07-17-2014  . CYSTOSCOPY WITH INJECTION N/A 10/29/2016   Procedure: CYSTOSCOPY WITH INJECTION BOTOX 100 UNITS, urethral dilation;  Surgeon: Carolan Clines, MD;  Location: [facility];  Service: Urology;  Laterality: N/A;  . ESOPHAGOGASTRODUODENOSCOPY  last one 07-01-2016  . EXCISIONAL BREAST BX  1990   BENIGN  . ORIF TIBIA & FIBULA FRACTURES  2010   retained rod  . TONSILLECTOMY  age 64  . TOTAL KNEE ARTHROPLASTY Left 01/15/2019   Procedure: LEFT TOTAL KNEE ARTHROPLASTY;  Surgeon: Leandrew Koyanagi, MD;  Location: [facility];  Service: Orthopedics;  Laterality: Left;  Marland Kitchen VAGINAL HYSTERECTOMY  1979  . VENTRAL HERNIA REPAIR  1990's   Social History   Occupational History  . Not on file  Tobacco Use  . Smoking status: Never Smoker  . Smokeless tobacco: Never Used  Substance and Sexual Activity  . Alcohol use: No  . Drug use: No  . Sexual activity: Not Currently    Comment: 1st intercourse 73 yo-Fewer than 5 partners

## 2019-05-17 ENCOUNTER — Telehealth: Payer: Self-pay | Admitting: Orthopaedic Surgery

## 2019-05-17 NOTE — Telephone Encounter (Signed)
Patient called and stated that she needed a refill of Hydrocodone Patients foot was slammed in car door and very painful.  Please give patient a call [phone].

## 2019-05-17 NOTE — Telephone Encounter (Signed)
See message below °

## 2019-05-18 ENCOUNTER — Other Ambulatory Visit: Payer: Self-pay | Admitting: Physician Assistant

## 2019-05-18 MED ORDER — HYDROCODONE-ACETAMINOPHEN 5-325 MG PO TABS: 1.0000 | ORAL_TABLET | Freq: Every day | ORAL | 0 refills | Status: DC | PRN

## 2019-05-18 NOTE — Telephone Encounter (Signed)
Sent in small rx

## 2019-05-18 NOTE — Telephone Encounter (Signed)
I called patient and advised. 

## 2019-05-24 ENCOUNTER — Encounter: Payer: Self-pay | Admitting: Physical Medicine and Rehabilitation

## 2019-05-24 ENCOUNTER — Other Ambulatory Visit: Payer: Self-pay

## 2019-05-24 ENCOUNTER — Ambulatory Visit: Payer: Self-pay

## 2019-05-24 ENCOUNTER — Telehealth: Payer: Self-pay | Admitting: Orthopaedic Surgery

## 2019-05-24 ENCOUNTER — Ambulatory Visit (INDEPENDENT_AMBULATORY_CARE_PROVIDER_SITE_OTHER): Payer: Medicare Other | Admitting: Physical Medicine and Rehabilitation

## 2019-05-24 VITALS — BP 149/76 | HR 85

## 2019-05-24 DIAGNOSIS — M5416 Radiculopathy, lumbar region: Secondary | ICD-10-CM

## 2019-05-24 MED ORDER — METHYLPREDNISOLONE ACETATE 80 MG/ML IJ SUSP
80.0000 mg | Freq: Once | INTRAMUSCULAR | Status: AC
  Administered 2019-05-24: 15:00:00 80 mg

## 2019-05-24 NOTE — Progress Notes (Signed)
.  Numeric Pain Rating Scale and Functional Assessment Average Pain 7   In the last MONTH (on 0-10 scale) has pain interfered with the following?  1. General activity like being  able to carry out your everyday physical activities such as walking, climbing stairs, carrying groceries, or moving a chair?  Rating(8)   +Driver, -BT, -Dye Allergies.  

## 2019-05-24 NOTE — Telephone Encounter (Signed)
Patient left a voice mail message requesting an RX refill on her Hydrocodone.  CB#[phone].  Thank you.

## 2019-05-25 ENCOUNTER — Telehealth: Payer: Self-pay | Admitting: Orthopaedic Surgery

## 2019-05-25 ENCOUNTER — Other Ambulatory Visit: Payer: Self-pay

## 2019-05-25 MED ORDER — TRAMADOL HCL 50 MG PO TABS: 50.0000 mg | ORAL_TABLET | Freq: Two times a day (BID) | ORAL | 0 refills | Status: DC | PRN

## 2019-05-25 NOTE — Telephone Encounter (Signed)
Cannot refill norco or anymore narcotics.  I can call in tramadol or an nsaid if she would like

## 2019-05-25 NOTE — Telephone Encounter (Signed)
See other message

## 2019-05-25 NOTE — Telephone Encounter (Signed)
Patient called to make a  appt w/XU. No answer LMOM and advised to call back to make appt @ [phone]

## 2019-05-25 NOTE — Telephone Encounter (Signed)
Called into pharm. Also wanted an appt with Dr Erlinda Hong. Appt made.

## 2019-05-25 NOTE — Telephone Encounter (Signed)
Patient left a voicemail message requesting an RX refill on her Hydrocodone.  CB#[phone].  Thank you.

## 2019-05-29 ENCOUNTER — Ambulatory Visit (INDEPENDENT_AMBULATORY_CARE_PROVIDER_SITE_OTHER): Payer: Medicare Other

## 2019-05-29 ENCOUNTER — Ambulatory Visit (INDEPENDENT_AMBULATORY_CARE_PROVIDER_SITE_OTHER): Payer: Medicare Other | Admitting: Orthopaedic Surgery

## 2019-05-29 ENCOUNTER — Other Ambulatory Visit: Payer: Self-pay

## 2019-05-29 DIAGNOSIS — Z96652 Presence of left artificial knee joint: Secondary | ICD-10-CM | POA: Insufficient documentation

## 2019-05-29 NOTE — Progress Notes (Signed)
Office Visit Note   Patient: Toni Hill           Date of Birth: 07/24/1945           MRN: [id number] Visit Date: 05/29/2019              Requested by: Maudie Mercury, MD [address] [place],  [place] [address] PCP: Maudie Mercury, MD   Assessment & Plan: Visit Diagnoses:  1. History of left knee replacement     Plan: Implants are stable at this point.  X-rays were reviewed with the patient and reassurance was provided.  I recommend heat to the hematoma and symptomatic treatment.  Questions encouraged and answered.  Follow-up in 8 months for her 1 year appointment with two-view x-rays of the left knee.  Follow-Up Instructions: Return in about 8 months (around 01/26/2020).   Orders:  Orders Placed This Encounter  Procedures  . XR Knee 1-2 Views Left   No orders of the defined types were placed in this encounter.     Procedures: No procedures performed   Clinical Data: No additional findings.   Subjective: Chief Complaint  Patient presents with  . Left Knee - Follow-up    Toni Hill is 4 months status post left total knee replacement.  She is coming in for evaluation of the hematoma that she sustained from closing a door onto her knee a couple weeks ago.  She is also wanting to get her left knee checked out because of sounds that it makes when she transitions from sit to stand.  Denies any constitutional symptoms.   Review of Systems   Objective: Vital Signs: There were no vitals taken for this visit.  Physical Exam  Ortho Exam Left knee exam shows a subcutaneous hematoma on the medial side that is tender to palpation.  No skin compromise or signs of infection.  She does have painless patellofemoral crepitus with sit to stand. Specialty Comments:  No specialty comments available.  Imaging: Xr Knee 1-2 Views Left  Result Date: 05/29/2019 Stable total knee replacement in good alignment.     PMFS History: Patient Active Problem List   Diagnosis Date Noted  . History of left knee replacement 05/29/2019  . Macrocytic anemia 03/08/2019  . UTI (urinary tract infection) 03/04/2019  . Pain of left calf 01/26/2019  . AKI (acute kidney injury) ([place]) 01/25/2019  . Leg swelling 01/25/2019  . QT prolongation 01/25/2019  . Elevated d-dimer 01/23/2019  . Acute blood loss as cause of postoperative anemia 01/23/2019  . Cellulitis of leg, left 01/23/2019  . At risk for adverse drug event 01/22/2019  . Bipolar 1 disorder ([place])   . 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 Osteoarthritis of left knee 12/30/2018  . Anxiety 12/30/2018  . Stage 3 chronic kidney disease 06/28/2016  . Hypercholesterolemia 08/20/2015  . Type 2 diabetes mellitus ([place]) 02/28/2014  . Esophageal reflux 02/28/2014  . Stress incontinence, female 02/28/2014  . Acquired hypothyroidism 02/28/2014  . Essential hypertension 11/17/2011   Past Medical History:  Diagnosis Date  . Bilateral lower extremity edema    right > left  . Bipolar 1 disorder ([place])   . CKD (chronic kidney disease) stage 3, GFR 30-59 ml/min     Dr Lawson Radar, [facility]  . Diabetes, polyneuropathy ([place])    FEET AND FINGER TIPS  . Fibromyalgia   . GERD (gastroesophageal reflux disease)   . History of chronic gastritis   . Hyperlipidemia   .  Hypertension    per pt was take off bp medication because of kidney disease told by her nephrologist  . Hypothyroidism   . Mixed incontinence urge and stress   . OA (osteoarthritis)    KNEES  . OAB (overactive bladder)   . PONV (postoperative nausea and vomiting)   . 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 Type 2 diabetes mellitus treated with insulin ([place])   . Wears glasses     Family History  Problem Relation Age of Onset  . Breast cancer Mother 46  . Cancer Father        ? type  . Breast cancer Sister 14  . Breast cancer Maternal Grandmother        ? age    Past Surgical History:  Procedure Laterality Date   . CHOLECYSTECTOMY OPEN  1990's  . COLONOSCOPY  last one 07-17-2014  . CYSTOSCOPY WITH INJECTION N/A 10/29/2016   Procedure: CYSTOSCOPY WITH INJECTION BOTOX 100 UNITS, urethral dilation;  Surgeon: Carolan Clines, MD;  Location: [facility];  Service: Urology;  Laterality: N/A;  . ESOPHAGOGASTRODUODENOSCOPY  last one 07-01-2016  . EXCISIONAL BREAST BX  1990   BENIGN  . ORIF TIBIA & FIBULA FRACTURES  2010   retained rod  . TONSILLECTOMY  age 24  . TOTAL KNEE ARTHROPLASTY Left 01/15/2019   Procedure: LEFT TOTAL KNEE ARTHROPLASTY;  Surgeon: Leandrew Koyanagi, MD;  Location: [facility];  Service: Orthopedics;  Laterality: Left;  Marland Kitchen VAGINAL HYSTERECTOMY  1979  . VENTRAL HERNIA REPAIR  1990's   Social History   Occupational History  . Not on file  Tobacco Use  . Smoking status: Never Smoker  . Smokeless tobacco: Never Used  Substance and Sexual Activity  . Alcohol use: No  . Drug use: No  . Sexual activity: Not Currently    Comment: 1st intercourse 73 yo-Fewer than 5 partners

## 2019-06-11 ENCOUNTER — Other Ambulatory Visit: Payer: Self-pay | Admitting: Internal Medicine

## 2019-06-11 NOTE — Telephone Encounter (Signed)
Needs refill on sertraline (ZOLOFT) 100 MG tablet  Needs a 90day supply ;pt contact [phone]   [id number] - [place], [place]

## 2019-06-12 MED ORDER — SERTRALINE HCL 100 MG PO TABS: 100.0000 mg | ORAL_TABLET | Freq: Every day | ORAL | 1 refills | Status: DC

## 2019-06-19 ENCOUNTER — Encounter: Payer: Medicare Other | Admitting: Physical Medicine and Rehabilitation

## 2019-06-21 ENCOUNTER — Encounter: Payer: Self-pay | Admitting: Physical Medicine and Rehabilitation

## 2019-06-21 ENCOUNTER — Ambulatory Visit: Payer: Self-pay

## 2019-06-21 ENCOUNTER — Ambulatory Visit (INDEPENDENT_AMBULATORY_CARE_PROVIDER_SITE_OTHER): Payer: Medicare Other | Admitting: Physical Medicine and Rehabilitation

## 2019-06-21 ENCOUNTER — Other Ambulatory Visit: Payer: Self-pay

## 2019-06-21 VITALS — BP 155/72 | HR 79

## 2019-06-21 DIAGNOSIS — M5416 Radiculopathy, lumbar region: Secondary | ICD-10-CM | POA: Diagnosis not present

## 2019-06-21 MED ORDER — METHYLPREDNISOLONE ACETATE 80 MG/ML IJ SUSP
80.0000 mg | Freq: Once | INTRAMUSCULAR | Status: AC
  Administered 2019-06-21: 17:00:00 80 mg

## 2019-06-21 NOTE — Progress Notes (Signed)
 .  Numeric Pain Rating Scale and Functional Assessment Average Pain 8   In the last MONTH (on 0-10 scale) has pain interfered with the following?  1. General activity like being  able to carry out your everyday physical activities such as walking, climbing stairs, carrying groceries, or moving a chair?  Rating(8)   +Driver, -BT, -Dye Allergies.  

## 2019-06-22 ENCOUNTER — Other Ambulatory Visit: Payer: Self-pay | Admitting: Orthopaedic Surgery

## 2019-06-25 NOTE — Progress Notes (Signed)
Toni Hill - 73 y.o. female MRN [id number]  Date of birth: [birth year]  Office Visit Note: Visit Date: 06/21/2019 PCP: Maudie Mercury, MD Referred by: Maudie Mercury, MD  Subjective: Chief Complaint  Patient presents with  . Lower Back - Pain  . Left Leg - Pain   HPI: Toni Hill is a 73 y.o. female who comes in today For repeat right L5-S1 interlaminar epidural steroid injection.  She had prior injection in November which helped out quite a bit but did not seem to last very long.  She has been seeing Dr. Eduard Roux and his assistant Tawanna Cooler, PA-C and has had left total knee replacement.  She saw Dr. Eunice Blase in October for her back pain but it was essentially left-sided radicular pain at that time but by the time that I saw her it was her right side and this is well-documented.  She has not had any follow-up with Dr. Junius Roads.  She had called in to get repeat injection because it did give her some relief but now on point of fact it is her left side is bothering her not her right side.  She reports pain in the left side rating her left leg down to the foot.  She denies any paresthesia or weakness.  She does report a fall that she had yesterday on to the left knee and the anterior part of the knee is hurting.  She does not report any increased swelling or locking or catching.  I did suggest to her that she needs follow-up with Dr. Erlinda Hong for her knee if she is concerned.  Brief exam today did not show any tenderness at the joint line did not show any instability.  There was mild bruising.  She is weightbearing but does have some pain with weightbearing but not specifically in the knee.  MRI is again reviewed with the patient.  MRI from April of this year shows significant disc herniation extrusion at L4-5 with bilateral facet arthropathy and lateral recess narrowing similar finding at L5-S1 in terms of the arthritis but no disc herniation here.  No high-grade central stenosis from a bony  standpoint.  She could have reason to have pain on either side but clearly from the April MRI of the right side looks worse.  She has having left-sided complaints today.  ROS Otherwise per HPI.  Assessment & Plan: Visit Diagnoses:  1. Lumbar radiculopathy     Plan: No additional findings.   Meds & Orders:  Meds ordered this encounter  Medications  . methylPREDNISolone acetate (DEPO-MEDROL) injection 80 mg    Orders Placed This Encounter  Procedures  . XR C-ARM NO REPORT  . Epidural Steroid injection    Follow-up: Return for visit to requesting physician as needed.   Procedures: No procedures performed  Lumbar Epidural Steroid Injection - Interlaminar Approach with Fluoroscopic Guidance  Patient: Toni Hill      Date of Birth: 1946-02-13 MRN: [id number] PCP: Maudie Mercury, MD      Visit Date: 06/21/2019   Universal Protocol:     Consent Given By: the patient  Position: PRONE  Additional Comments: Vital signs were monitored before and after the procedure. Patient was prepped and draped in the usual sterile fashion. The correct patient, procedure, and site was verified.   Injection Procedure Details:  Procedure Site One Meds Administered:  Meds ordered this encounter  Medications  . methylPREDNISolone acetate (DEPO-MEDROL) injection 80 mg     Laterality: Left  Location/Site:  L5-S1  Needle size: 20 G  Needle type: Tuohy  Needle Placement: Paramedian epidural  Findings:   -Comments: Excellent flow of contrast into the epidural space.  Procedure Details: Using a paramedian approach from the side mentioned above, the region overlying the inferior lamina was localized under fluoroscopic visualization and the soft tissues overlying this structure were infiltrated with 4 ml. of 1% Lidocaine without Epinephrine. The Tuohy needle was inserted into the epidural space using a paramedian approach.   The epidural space was localized using loss of resistance  along with lateral and bi-planar fluoroscopic views.  After negative aspirate for air, blood, and CSF, a 2 ml. volume of Isovue-250 was injected into the epidural space and the flow of contrast was observed. Radiographs were obtained for documentation purposes.    The injectate was administered into the level noted above.   Additional Comments:  The patient tolerated the procedure well Dressing: 2 x 2 sterile gauze and Band-Aid    Post-procedure details: Patient was observed during the procedure. Post-procedure instructions were reviewed.  Patient left the clinic in stable condition.    Clinical History: MRI LUMBAR SPINE WITHOUT CONTRAST  TECHNIQUE: Multiplanar, multisequence MR imaging of the lumbar spine was performed. No intravenous contrast was administered.  COMPARISON:  Radiography 09/20/2018  FINDINGS: Segmentation:  5 lumbar type vertebral bodies.  Alignment:  2 mm retrolisthesis L3-4.  Vertebrae: Benign appearing hemangioma within the right side of the L4 vertebral body. No other focal bone finding.  Conus medullaris and cauda equina: Conus extends to the L1 level. Conus and cauda equina appear normal.  Paraspinal and other soft tissues: Negative  Disc levels:  T12-L1: Normal.  L1-2: Minimal annular bulging with annular fissure.  No stenosis.  L2-3: Minimal disc bulge. Annular fissure in the left extraforaminal region. No stenosis.  L3-4: 2 mm retrolisthesis. Disc degeneration with shallow disc protrusion. Small amount of extruded disc material on the right extending towards the foramen. Some potential this could affect the exiting right L3 nerve. Right lateral recess narrowing could affect the right L4 nerve.  L4-5: Broad-based disc herniation with caudal migration of disc material to the right of midline behind L5. Facet degeneration and hypertrophy with edematous change particularly on the right. L4 nerve root appears to exit the  foramina without compression. Bilateral lateral recess stenosis could affect either L5 nerve. Likely compression of the right L5 nerve by the extruded caudally migrated disc fragment.  L5-S1: Shallow disc protrusion more prominent towards the right. Bilateral facet degeneration and hypertrophy.  IMPRESSION: L3-4: 2 mm retrolisthesis. Shallow disc protrusion with a small amount of extruded disc material extending into the proximal foramen on the right. This could possibly affect the right L3 nerve. There is also right lateral recess narrowing that could affect the right L4 nerve.  L4-5: Broad-based disc herniation with a fairly large extruded fragment migrated caudally in the right lateral recess down behind L5 to the proximal foramen. Right L5 nerve compression is quite likely because of this. Additionally, at the disc level, there is stenosis of both subarticular lateral recesses which could affect either L5 nerve in that location. Pronounced facet arthropathy at this level with edematous change worse on the right which could be painful.  L5-S1: Shallow disc protrusion more prominent towards the right. Facet degeneration and hypertrophy worse on the right. Narrowing of the subarticular lateral recess on the right that could affect the right S1 nerve. Facet arthropathy at this level could be painful.  Note  that there is a hemangioma within the right side of the L4 vertebral body, which could be relevant should pedicle screws be considered.   Electronically Signed   By: Nelson Chimes M.D.   On: 10/20/2018 14:23   She reports that she has never smoked. She has never used smokeless tobacco.  Recent Labs    12/20/18 1511 04/03/19 1554  HGBA1C 7.3* 7.5*    Objective:  VS:  HT:    WT:   BMI:     BP:(!) 155/72  HR:79bpm  TEMP: ( )  RESP:  Physical Exam Constitutional:      General: She is not in acute distress.    Appearance: Normal appearance. She is not  ill-appearing.  HENT:     Head: Normocephalic and atraumatic.     Right Ear: External ear normal.     Left Ear: External ear normal.  Eyes:     Extraocular Movements: Extraocular movements intact.  Cardiovascular:     Rate and Rhythm: Normal rate.     Pulses: Normal pulses.  Musculoskeletal:     Right lower leg: No edema.     Left lower leg: No edema.     Comments: Patient has good distal strength with no pain over the greater trochanters.  No clonus or focal weakness.  She is ambulating with a forward flexed lumbar spine.  Pain with extension and facet loading.  Looking at her left knee show some generalized swelling that she typically see with knee replacement.  Mild bruising no dislocation no instability.  Skin:    Findings: No erythema, lesion or rash.  Neurological:     General: No focal deficit present.     Mental Status: She is alert and oriented to person, place, and time.     Sensory: No sensory deficit.     Motor: No weakness or abnormal muscle tone.     Coordination: Coordination normal.     Gait: Gait abnormal.  Psychiatric:        Mood and Affect: Mood normal.        Behavior: Behavior normal.     Ortho Exam Imaging: No results found.  Past Medical/Family/Surgical/Social History: Medications & Allergies reviewed per EMR, new medications updated. Patient Active Problem List   Diagnosis Date Noted  . History of left knee replacement 05/29/2019  . Macrocytic anemia 03/08/2019  . UTI (urinary tract infection) 03/04/2019  . Pain of left calf 01/26/2019  . AKI (acute kidney injury) ([place]) 01/25/2019  . Leg swelling 01/25/2019  . QT prolongation 01/25/2019  . Elevated d-dimer 01/23/2019  . Acute blood loss as cause of postoperative anemia 01/23/2019  . Cellulitis of leg, left 01/23/2019  . At risk for adverse drug event 01/22/2019  . Bipolar 1 disorder ([place])   . 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 Osteoarthritis of left knee 12/30/2018  .  Anxiety 12/30/2018  . Stage 3 chronic kidney disease 06/28/2016  . Hypercholesterolemia 08/20/2015  . Type 2 diabetes mellitus ([place]) 02/28/2014  . Esophageal reflux 02/28/2014  . Stress incontinence, female 02/28/2014  . Acquired hypothyroidism 02/28/2014  . Essential hypertension 11/17/2011   Past Medical History:  Diagnosis Date  . Bilateral lower extremity edema    right > left  . Bipolar 1 disorder ([place])   . CKD (chronic kidney disease) stage 3, GFR 30-59 ml/min     Dr Lawson Radar, [facility]  . Diabetes, polyneuropathy ([place])    FEET AND FINGER TIPS  . Fibromyalgia   .  GERD (gastroesophageal reflux disease)   . History of chronic gastritis   . Hyperlipidemia   . Hypertension    per pt was take off bp medication because of kidney disease told by her nephrologist  . Hypothyroidism   . Mixed incontinence urge and stress   . OA (osteoarthritis)    KNEES  . OAB (overactive bladder)   . PONV (postoperative nausea and vomiting)   . 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 Type 2 diabetes mellitus treated with insulin ([place])   . Wears glasses    Family History  Problem Relation Age of Onset  . Breast cancer Mother 63  . Cancer Father        ? type  . Breast cancer Sister 68  . Breast cancer Maternal Grandmother        ? age   Past Surgical History:  Procedure Laterality Date  . CHOLECYSTECTOMY OPEN  1990's  . COLONOSCOPY  last one 07-17-2014  . CYSTOSCOPY WITH INJECTION N/A 10/29/2016   Procedure: CYSTOSCOPY WITH INJECTION BOTOX 100 UNITS, urethral dilation;  Surgeon: Carolan Clines, MD;  Location: [facility];  Service: Urology;  Laterality: N/A;  . ESOPHAGOGASTRODUODENOSCOPY  last one 07-01-2016  . EXCISIONAL BREAST BX  1990   BENIGN  . ORIF TIBIA & FIBULA FRACTURES  2010   retained rod  . TONSILLECTOMY  age 67  . TOTAL KNEE ARTHROPLASTY Left 01/15/2019   Procedure: LEFT TOTAL KNEE ARTHROPLASTY;  Surgeon: Leandrew Koyanagi, MD;  Location:  [facility];  Service: Orthopedics;  Laterality: Left;  Marland Kitchen VAGINAL HYSTERECTOMY  1979  . VENTRAL HERNIA REPAIR  1990's   Social History   Occupational History  . Not on file  Tobacco Use  . Smoking status: Never Smoker  . Smokeless tobacco: Never Used  Substance and Sexual Activity  . Alcohol use: No  . Drug use: No  . Sexual activity: Not Currently    Comment: 1st intercourse 73 yo-Fewer than 5 partners

## 2019-06-25 NOTE — Procedures (Signed)
Lumbar Epidural Steroid Injection - Interlaminar Approach with Fluoroscopic Guidance  Patient: Toni Hill      Date of Birth: 28-Aug-1945 MRN: [id number] PCP: Maudie Mercury, MD      Visit Date: 06/21/2019   Universal Protocol:     Consent Given By: the patient  Position: PRONE  Additional Comments: Vital signs were monitored before and after the procedure. Patient was prepped and draped in the usual sterile fashion. The correct patient, procedure, and site was verified.   Injection Procedure Details:  Procedure Site One Meds Administered:  Meds ordered this encounter  Medications  . methylPREDNISolone acetate (DEPO-MEDROL) injection 80 mg     Laterality: Left  Location/Site:  L5-S1  Needle size: 20 G  Needle type: Tuohy  Needle Placement: Paramedian epidural  Findings:   -Comments: Excellent flow of contrast into the epidural space.  Procedure Details: Using a paramedian approach from the side mentioned above, the region overlying the inferior lamina was localized under fluoroscopic visualization and the soft tissues overlying this structure were infiltrated with 4 ml. of 1% Lidocaine without Epinephrine. The Tuohy needle was inserted into the epidural space using a paramedian approach.   The epidural space was localized using loss of resistance along with lateral and bi-planar fluoroscopic views.  After negative aspirate for air, blood, and CSF, a 2 ml. volume of Isovue-250 was injected into the epidural space and the flow of contrast was observed. Radiographs were obtained for documentation purposes.    The injectate was administered into the level noted above.   Additional Comments:  The patient tolerated the procedure well Dressing: 2 x 2 sterile gauze and Band-Aid    Post-procedure details: Patient was observed during the procedure. Post-procedure instructions were reviewed.  Patient left the clinic in stable condition.

## 2019-07-02 ENCOUNTER — Encounter (HOSPITAL_COMMUNITY): Payer: Self-pay | Admitting: *Deleted

## 2019-07-02 ENCOUNTER — Other Ambulatory Visit: Payer: Self-pay

## 2019-07-02 ENCOUNTER — Emergency Department (HOSPITAL_COMMUNITY): Payer: Medicare Other

## 2019-07-02 ENCOUNTER — Emergency Department (HOSPITAL_COMMUNITY)
Admission: EM | Admit: 2019-07-02 | Discharge: 2019-07-02 | Disposition: A | Discharge status: Home / Self Care | Payer: Medicare Other | Attending: Emergency Medicine | Admitting: Emergency Medicine

## 2019-07-02 ENCOUNTER — Telehealth: Payer: Self-pay

## 2019-07-02 DIAGNOSIS — R0602 Shortness of breath: Secondary | ICD-10-CM | POA: Diagnosis present

## 2019-07-02 DIAGNOSIS — N183 Chronic kidney disease, stage 3 unspecified: Secondary | ICD-10-CM | POA: Diagnosis not present

## 2019-07-02 DIAGNOSIS — Z79899 Other long term (current) drug therapy: Secondary | ICD-10-CM | POA: Insufficient documentation

## 2019-07-02 DIAGNOSIS — Z7982 Long term (current) use of aspirin: Secondary | ICD-10-CM | POA: Diagnosis not present

## 2019-07-02 DIAGNOSIS — J069 Acute upper respiratory infection, unspecified: Secondary | ICD-10-CM

## 2019-07-02 DIAGNOSIS — I129 Hypertensive chronic kidney disease with stage 1 through stage 4 chronic kidney disease, or unspecified chronic kidney disease: Secondary | ICD-10-CM | POA: Diagnosis not present

## 2019-07-02 DIAGNOSIS — Z794 Long term (current) use of insulin: Secondary | ICD-10-CM | POA: Diagnosis not present

## 2019-07-02 DIAGNOSIS — E1122 Type 2 diabetes mellitus with diabetic chronic kidney disease: Secondary | ICD-10-CM | POA: Diagnosis not present

## 2019-07-02 DIAGNOSIS — Z20828 Contact with and (suspected) exposure to other viral communicable diseases: Secondary | ICD-10-CM | POA: Insufficient documentation

## 2019-07-02 DIAGNOSIS — E039 Hypothyroidism, unspecified: Secondary | ICD-10-CM | POA: Insufficient documentation

## 2019-07-02 DIAGNOSIS — F319 Bipolar disorder, unspecified: Secondary | ICD-10-CM | POA: Diagnosis not present

## 2019-07-02 DIAGNOSIS — R05 Cough: Secondary | ICD-10-CM | POA: Insufficient documentation

## 2019-07-02 LAB — BASIC METABOLIC PANEL
Anion gap: 9 (ref 5–15)
BUN: 30 mg/dL — ABNORMAL HIGH (ref 8–23)
CO2: 27 mmol/L (ref 22–32)
Calcium: 9.4 mg/dL (ref 8.9–10.3)
Chloride: 104 mmol/L (ref 98–111)
Creatinine, Ser: 1.59 mg/dL — ABNORMAL HIGH (ref 0.44–1.00)
GFR calc Af Amer: 37 mL/min — ABNORMAL LOW (ref >60)
GFR calc non Af Amer: 32 mL/min — ABNORMAL LOW (ref >60)
Glucose, Bld: 254 mg/dL — ABNORMAL HIGH (ref 70–99)
Potassium: 4.6 mmol/L (ref 3.5–5.1)
Sodium: 140 mmol/L (ref 135–145)

## 2019-07-02 LAB — CBC WITH DIFFERENTIAL/PLATELET
Abs Immature Granulocytes: 0.03 10*3/uL (ref 0.00–0.07)
Basophils Absolute: 0 10*3/uL (ref 0.0–0.1)
Basophils Relative: 0 %
Eosinophils Absolute: 0 10*3/uL (ref 0.0–0.5)
Eosinophils Relative: 0 %
HCT: 42.5 % (ref 36.0–46.0)
Hemoglobin: 13 g/dL (ref 12.0–15.0)
Immature Granulocytes: 0 %
Lymphocytes Relative: 18 %
Lymphs Abs: 1.6 10*3/uL (ref 0.7–4.0)
MCH: 30.4 pg (ref 26.0–34.0)
MCHC: 30.6 g/dL (ref 30.0–36.0)
MCV: 99.5 fL (ref 80.0–100.0)
Monocytes Absolute: 0.9 10*3/uL (ref 0.1–1.0)
Monocytes Relative: 9 %
Neutro Abs: 6.5 10*3/uL (ref 1.7–7.7)
Neutrophils Relative %: 73 %
Platelets: 299 10*3/uL (ref 150–400)
RBC: 4.27 MIL/uL (ref 3.87–5.11)
RDW: 12.5 % (ref 11.5–15.5)
WBC: 9.1 10*3/uL (ref 4.0–10.5)
nRBC: 0 % (ref 0.0–0.2)

## 2019-07-02 LAB — CBG MONITORING, ED
Glucose-Capillary: 189 mg/dL — ABNORMAL HIGH (ref 70–99)
Glucose-Capillary: 100 mg/dL — ABNORMAL HIGH (ref 70–99)

## 2019-07-02 MED ORDER — ALBUTEROL SULFATE HFA 108 (90 BASE) MCG/ACT IN AERS
2.0000 | INHALATION_SPRAY | RESPIRATORY_TRACT | Status: DC | PRN
  Administered 2019-07-02: 2 via RESPIRATORY_TRACT
  Filled 2019-07-02: qty 6.7

## 2019-07-02 NOTE — ED Notes (Signed)
Patient ambulated to bathroom with nurse and oxygen meter. Pt's oxygen level remained at 100% while ambulating. No resp distress/SOB noted.

## 2019-07-02 NOTE — Telephone Encounter (Signed)
Received TC from pt, she c/o productive cough x several days and describes sputum as "thick, brown".  Also c/o chills, no sense of taste or smell.  Pt states she is a diabetic and is eating very little, c/o dizziness.  Pt also c/o difficulty breathing and describes this as "unable to get enough air in".  States all of her symptoms are getting worse since onset a "few days ago".  RN is able to hear intermittent wheezing with conversation.  Pt advised to present to ER for respiratory evaluation.  She states her husband will take her now.   SChaplin, RN,BSN

## 2019-07-02 NOTE — ED Provider Notes (Signed)
[facility] EMERGENCY DEPARTMENT Provider Note   CSN: [id number] Arrival date & time: 07/02/19  1330     History Chief Complaint  Patient presents with  . Cough    Toni Hill is a 73 y.o. female.  Patient with history of diabetes, chronic kidney disease --presents to the emergency department with 5-day history of cough.  She also reports wheezing and chills.  She does not have a thermometer and has not noted any fevers.  She has had some shortness of breath with coughing.  Today she developed productive brown sputum and called her doctor.  They recommended that she go to the emergency department based on her complaint of shortness of breath.  Also reports decreased sense of taste and smell.  Patient does not have a history of asthma.  She states that her daughter just had hip surgery and is recuperating at home.  No known sick contacts.  No chest pain, nausea or vomiting.  Onset of symptoms acute.  Course is constant.        Past Medical History:  Diagnosis Date  . Bilateral lower extremity edema    right > left  . Bipolar 1 disorder ([place])   . CKD (chronic kidney disease) stage 3, GFR 30-59 ml/min     Dr Lawson Radar, [facility]  . Diabetes, polyneuropathy ([place])    FEET AND FINGER TIPS  . Fibromyalgia   . GERD (gastroesophageal reflux disease)   . History of chronic gastritis   . Hyperlipidemia   . Hypertension    per pt was take off bp medication because of kidney disease told by her nephrologist  . Hypothyroidism   . Mixed incontinence urge and stress   . OA (osteoarthritis)    KNEES  . OAB (overactive bladder)   . PONV (postoperative nausea and vomiting)   . 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 Type 2 diabetes mellitus treated with insulin ([place])   . Wears glasses     Patient Active Problem List   Diagnosis Date Noted  . History of left knee replacement 05/29/2019  . Macrocytic anemia 03/08/2019  . UTI (urinary tract  infection) 03/04/2019  . Pain of left calf 01/26/2019  . AKI (acute kidney injury) ([place]) 01/25/2019  . Leg swelling 01/25/2019  . QT prolongation 01/25/2019  . Elevated d-dimer 01/23/2019  . Acute blood loss as cause of postoperative anemia 01/23/2019  . Cellulitis of leg, left 01/23/2019  . At risk for adverse drug event 01/22/2019  . Bipolar 1 disorder ([place])   . 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 Osteoarthritis of left knee 12/30/2018  . Anxiety 12/30/2018  . Stage 3 chronic kidney disease 06/28/2016  . Hypercholesterolemia 08/20/2015  . Type 2 diabetes mellitus ([place]) 02/28/2014  . Esophageal reflux 02/28/2014  . Stress incontinence, female 02/28/2014  . Acquired hypothyroidism 02/28/2014  . Essential hypertension 11/17/2011    Past Surgical History:  Procedure Laterality Date  . CHOLECYSTECTOMY OPEN  1990's  . COLONOSCOPY  last one 07-17-2014  . CYSTOSCOPY WITH INJECTION N/A 10/29/2016   Procedure: CYSTOSCOPY WITH INJECTION BOTOX 100 UNITS, urethral dilation;  Surgeon: Carolan Clines, MD;  Location: [facility];  Service: Urology;  Laterality: N/A;  . ESOPHAGOGASTRODUODENOSCOPY  last one 07-01-2016  . EXCISIONAL BREAST BX  1990   BENIGN  . ORIF TIBIA & FIBULA FRACTURES  2010   retained rod  . TONSILLECTOMY  age 89  . TOTAL KNEE  ARTHROPLASTY Left 01/15/2019   Procedure: LEFT TOTAL KNEE ARTHROPLASTY;  Surgeon: Leandrew Koyanagi, MD;  Location: [facility];  Service: Orthopedics;  Laterality: Left;  Marland Kitchen VAGINAL HYSTERECTOMY  1979  . VENTRAL HERNIA REPAIR  7's     OB History    Gravida  2   Para  2   Term      Preterm      AB      Living  2     SAB      TAB      Ectopic      Multiple      Live Births              Family History  Problem Relation Age of Onset  . Breast cancer Mother 93  . Cancer Father        ? type  . Breast cancer Sister 82  . Breast cancer Maternal Grandmother        ? age    Social History    Tobacco Use  . Smoking status: Never Smoker  . Smokeless tobacco: Never Used  Substance Use Topics  . Alcohol use: No  . Drug use: No    Home Medications Prior to Admission medications   Medication Sig Start Date End Date Taking? Authorizing Provider  amLODipine (NORVASC) 5 MG tablet Take 5 mg by mouth daily. 05/03/19   [provider]  aspirin 81 MG EC tablet TAKE 1 TABLET (81 MG TOTAL) BY MOUTH 2 (TWO) TIMES DAILY. 02/20/19   Leandrew Koyanagi, MD  baclofen (LIORESAL) 10 MG tablet Take 0.5-1 tablets (5-10 mg total) by mouth 3 (three) times daily as needed for muscle spasms. 05/04/19   Hilts, Legrand Como, MD  Calcium Carb-Cholecalciferol (CALCIUM 600/VITAMIN D3) 600-800 MG-UNIT TABS Take 1 tablet by mouth daily.     [provider]  Cholecalciferol (EQL VITAMIN D3) 50 MCG (2000 UT) CAPS Take 2,000 Units by mouth daily.     [provider]  diclofenac sodium (VOLTAREN) 1 % GEL APPLY 2 GRAMS TO AFFECTED AREA 4 TIMES A DAY 04/27/19   Maudie Mercury, MD  ezetimibe (ZETIA) 10 MG tablet Take 10 mg by mouth every evening.     [provider]  gabapentin (NEURONTIN) 100 MG capsule TAKE 1-3 CAPSULES (100-300 MG TOTAL) BY MOUTH 3 (THREE) TIMES DAILY AS NEEDED. 03/15/19   Aundra Dubin, PA-C  HYDROcodone-acetaminophen (NORCO) 5-325 MG tablet Take 1 tablet by mouth daily as needed for moderate pain. 05/18/19   Aundra Dubin, PA-C  hydrocortisone 2.5 % cream Apply 1 application topically 2 (two) times daily as needed (dry skin).    [provider]  insulin aspart (NOVOLOG) 100 UNIT/ML injection Inject 0-9 Units into the skin 3 (three) times daily with meals. Less than 100= 0 units 101-150= 5 units 151-200= 7 units Greater than 200= 9 units    [provider]  Insulin Glargine (BASAGLAR KWIKPEN) 100 UNIT/ML SOPN Inject 25 Units into the skin daily.     [provider]  levothyroxine (SYNTHROID) 125 MCG tablet Take 125 mcg by mouth daily  before breakfast.    [provider]  lisinopril (ZESTRIL) 10 MG tablet Take 10 mg by mouth daily. 12/29/18   [provider]  methocarbamol (ROBAXIN) 750 MG tablet TAKE 1 TABLET (750 MG TOTAL) BY MOUTH 2 (TWO) TIMES DAILY AS NEEDED FOR MUSCLE SPASMS. 03/13/19   Aundra Dubin, PA-C  ondansetron (ZOFRAN) 4 MG tablet TAKE 1-2  TABLETS (4-8 MG TOTAL) BY MOUTH EVERY 8 (EIGHT) HOURS AS NEEDED FOR NAUSEA OR VOMITING. 06/25/19   Aundra Dubin, PA-C  sertraline (ZOLOFT) 100 MG tablet Take 1 tablet (100 mg total) by mouth daily. 06/12/19   Maudie Mercury, MD  simvastatin (ZOCOR) 40 MG tablet Take 40 mg by mouth every evening.     [provider]  Sodium Phosphates (RA SALINE ENEMA) 19-7 GM/118ML ENEM Place 1 each rectally as needed (for constipation).    [provider]  traMADol (ULTRAM) 50 MG tablet Take 1 tablet (50 mg total) by mouth 2 (two) times daily as needed. 05/25/19   Aundra Dubin, PA-C  vitamin B-12 (CYANOCOBALAMIN) 500 MCG tablet Take 1,000 mcg by mouth daily.    [provider]    Allergies    Bactrim [sulfamethoxazole-trimethoprim], Morphine and related, Vioxx [rofecoxib], and Penicillins  Review of Systems   Review of Systems  Constitutional: Positive for chills. Negative for fever.  HENT: Negative for rhinorrhea and sore throat.   Eyes: Negative for redness.  Respiratory: Positive for cough, shortness of breath and wheezing.   Cardiovascular: Negative for chest pain and leg swelling.  Gastrointestinal: Negative for abdominal pain, diarrhea, nausea and vomiting.  Genitourinary: Negative for dysuria.  Musculoskeletal: Negative for myalgias.  Skin: Negative for rash.  Neurological: Negative for headaches.    Physical Exam Updated Vital Signs BP (!) 141/68 (BP Location: Right Arm)   Pulse 85   Temp 98.2 F (36.8 C) (Oral)   Resp 18   Wt 100.2 kg   SpO2 97%   BMI 32.64 kg/m   Physical Exam Vitals and nursing note reviewed.   Constitutional:      Appearance: She is well-developed.  HENT:     Head: Normocephalic and atraumatic.  Eyes:     General:        Right eye: No discharge.        Left eye: No discharge.     Conjunctiva/sclera: Conjunctivae normal.  Cardiovascular:     Rate and Rhythm: Normal rate and regular rhythm.     Heart sounds: Normal heart sounds.     Comments: Coughing at times during exam. Upper airway wheeze with cough.  Pulmonary:     Effort: Pulmonary effort is normal.     Breath sounds: Normal breath sounds. No wheezing, rhonchi or rales.  Abdominal:     Palpations: Abdomen is soft.     Tenderness: There is no abdominal tenderness.  Musculoskeletal:     Cervical back: Normal range of motion and neck supple.  Skin:    General: Skin is warm and dry.  Neurological:     Mental Status: She is alert.  Psychiatric:        Mood and Affect: Mood is anxious.     ED Results / Procedures / Treatments   Labs (all labs ordered are listed, but only abnormal results are displayed) Labs Reviewed  BASIC METABOLIC PANEL - Abnormal; Notable for the following components:      Result Value   Glucose, Bld 254 (*)    BUN 30 (*)    Creatinine, Ser 1.59 (*)    GFR calc non Af Amer 32 (*)    GFR calc Af Amer 37 (*)    All other components within normal limits  CBG MONITORING, ED - Abnormal; Notable for the following components:   Glucose-Capillary 189 (*)    All other components within normal limits  CBG MONITORING, ED - Abnormal; Notable  for the following components:   Glucose-Capillary 100 (*)    All other components within normal limits  SARS CORONAVIRUS 2 (TAT 6-24 HRS)  CBC WITH DIFFERENTIAL/PLATELET  POC SARS CORONAVIRUS 2 AG -  ED    EKG None  Radiology DG Chest 2 View  Result Date: 07/02/2019 CLINICAL DATA:  Cough, shortness of breath. EXAM: CHEST - 2 VIEW COMPARISON:  January 25, 2019. FINDINGS: The heart size and mediastinal contours are within normal limits. Both lungs are  clear. No pneumothorax pleural effusion is noted. The visualized skeletal structures are unremarkable. IMPRESSION: No active cardiopulmonary disease. Electronically Signed   By: Marijo Conception M.D.   On: 07/02/2019 14:30    Procedures Procedures (including critical care time)  Medications Ordered in ED Medications  albuterol (VENTOLIN HFA) 108 (90 Base) MCG/ACT inhaler 2 puff (has no administration in time range)    ED Course  I have reviewed the triage vital signs and the nursing notes.  Pertinent labs & imaging results that were available during my care of the patient were reviewed by me and considered in my medical decision making (see chart for details).  Patient seen and examined.  Reviewed work-up to this point.  Patient appears worried but is otherwise in no distress.  Will give albuterol to see if this helps with cough or wheezing.  I would avoid steroids at this time given diabetes and elevated blood sugar.  Will have patient ambulate and check pulse oximeter to make sure she does not desat.  Send out COVID.  Anticipate discharge to home.  Vital signs reviewed and are as follows: BP (!) 141/68 (BP Location: Right Arm)   Pulse 85   Temp 98.2 F (36.8 C) (Oral)   Resp 18   Wt 100.2 kg   SpO2 97%   BMI 32.64 kg/m   10:30 PM O2 maintained at 100% with ambulation per RN noted.   11:06 PM Patient stable for discharge. Discussed with Dr. Laverta Baltimore.     MDM Rules/Calculators/A&P                      Patient with cough.  Sent by PCP for shortness of breath.  Patient is maintaining good oxygen saturation and ambulated well.  Chest x-ray without pneumonia.  No fever here.  She looks good.  Vital signs are normal.  Covid testing pending.  Home with albuterol inhaler for symptomatic control.  No chest pain to suggest PE/ACS.    Final Clinical Impression(s) / ED Diagnoses Final diagnoses:  Viral URI with cough      Rx / DC Orders ED Discharge Orders    None       Carlisle Cater, Hershal Coria 07/02/19 2309    Margette Fast, MD 07/03/19 2032

## 2019-07-02 NOTE — Telephone Encounter (Signed)
Totally agree.  Thank you.

## 2019-07-02 NOTE — Discharge Instructions (Signed)
Please read and follow all provided instructions.  Your diagnoses today include:  1. Viral URI with cough     Tests performed today include:  Vital signs. See below for your results today.   Blood counts and electrolytes - high blood sugar and stable kidney function  Chest x-ray - no pneumonia  COVID test - pending  Medications prescribed:   Albuterol inhaler - medication that opens up your airway  Use inhaler as follows: 1-2 puffs with spacer every 4 hours as needed for wheezing, cough, or shortness of breath.   Take any prescribed medications only as directed. Treatment for your infection is aimed at treating the symptoms. There are no medications, such as antibiotics, that will cure your infection.   Home care instructions:  Follow any educational materials contained in this packet.   Your illness is contagious and can be spread to others, especially during the first 3 or 4 days. It cannot be cured by antibiotics or other medicines. Take basic precautions such as washing your hands often, covering your mouth when you cough or sneeze, and avoiding public places where you could spread your illness to others.   Please continue drinking plenty of fluids.  Use over-the-counter medicines as needed as directed on packaging for symptom relief.  You may also use ibuprofen or tylenol as directed on packaging for pain or fever.  Do not take multiple medicines containing Tylenol or acetaminophen to avoid taking too much of this medication.  Follow-up instructions: Please follow-up with your primary care provider in the next 3 days for further evaluation of your symptoms if you are not feeling better.   Return instructions:   Please return to the Emergency Department if you experience worsening symptoms.   RETURN IMMEDIATELY IF you develop shortness of breath, confusion or altered mental status, a new rash, become dizzy, faint, or poorly responsive, or are unable to be cared for at  home.  Please return if you have persistent vomiting and cannot keep down fluids or develop a fever that is not controlled by tylenol or motrin.    Please return if you have any other emergent concerns.  Additional Information:  Your vital signs today were: BP 130/83   Pulse 80   Temp 98.2 F (36.8 C) (Oral)   Resp 18   Wt 100.2 kg   SpO2 99%   BMI 32.64 kg/m  If your blood pressure (BP) was elevated above 135/85 this visit, please have this repeated by your doctor within one month. --------------

## 2019-07-02 NOTE — ED Triage Notes (Signed)
Pt is here for URI with productive cough, brown sputum for a few days.  Pt called PCP and was told to come in.  Pt has had some chills.  Pt reports some sob with this.  Spo2 is 96% on RA at this time

## 2019-07-03 LAB — SARS CORONAVIRUS 2 (TAT 6-24 HRS): SARS Coronavirus 2: NEGATIVE

## 2019-07-07 ENCOUNTER — Other Ambulatory Visit: Payer: Self-pay | Admitting: Internal Medicine

## 2019-07-09 NOTE — Telephone Encounter (Signed)
Called pt - stated she does not have an Endocrinologist yet.

## 2019-07-09 NOTE — Telephone Encounter (Signed)
Refill Request  insulin aspart (NOVOLOG) 100 UNIT/ML injection  [id number] - [place], [place] - [address]

## 2019-07-10 ENCOUNTER — Encounter: Payer: Self-pay | Admitting: Orthopaedic Surgery

## 2019-07-10 ENCOUNTER — Ambulatory Visit (INDEPENDENT_AMBULATORY_CARE_PROVIDER_SITE_OTHER): Payer: Medicare Other | Admitting: Orthopaedic Surgery

## 2019-07-10 ENCOUNTER — Other Ambulatory Visit: Payer: Self-pay

## 2019-07-10 DIAGNOSIS — Z96652 Presence of left artificial knee joint: Secondary | ICD-10-CM

## 2019-07-10 MED ORDER — INSULIN ASPART 100 UNIT/ML ~~LOC~~ SOLN: 0.0000 [IU] | Freq: Three times a day (TID) | SUBCUTANEOUS | 0 refills | Status: DC

## 2019-07-10 NOTE — Progress Notes (Signed)
Office Visit Note   Patient: Toni Hill           Date of Birth: [birth year]           MRN: [id number] Visit Date: 07/10/2019              Requested by: Maudie Mercury, MD [address] [place],  [place] [address] PCP: Maudie Mercury, MD   Assessment & Plan: Visit Diagnoses:  1. History of left knee replacement     Plan: I gave her reassurance that she has a stable total knee arthroplasty and that the pain she is experiencing is very common with people following knee replacement surgery.  I told her to push through the pain and that he can take again 6 months to a year to get over knee replacement surgery compared to other joint replacement surgeries.  I do feel that she is a candidate for trying Voltaren gel on the area of her tibia where she is tender.  This would be safer given the fact that she does have chronic kidney disease.  I would like to try this twice a day and I explained what this involves and gave her a card so she can pick this up as an outpatient.  All question concerns were answered and addressed.  She will keep her next follow-up with Dr. Erlinda Hong.  Follow-Up Instructions: Return in about 4 weeks (around 08/07/2019).   Orders:  No orders of the defined types were placed in this encounter.  No orders of the defined types were placed in this encounter.     Procedures: No procedures performed   Clinical Data: No additional findings.   Subjective: Chief Complaint  Patient presents with  . Left Knee - Pain  The patient is a 74 year old female who is a patient of one of my partners Dr.Xu.  She had a total knee arthroplasty by him on January 14, 2018.  That is just been 5 months since surgery.  She has been dealing with some chronic pain since then.  She wanted to see [place] for second opinion since I done her daughter's knee replacement.  I had a long thorough discussion about knee replacement surgery with her and that some people do experience pain that can be  lingering for up to a year after surgery.  She denies any instability symptoms.  She says it aches all the time.  She is only on tramadol now for pain as well as Neurontin 300 mg twice a day and Robaxin.  She feels like she may need a stronger pain medication but I again discussed with her the need to try to get off narcotics because the body gets used to hurting the longer she is on pain medications.  HPI  Review of Systems She currently denies any headache, chest pain, shortness of breath, fever, chills, nausea, vomiting  Objective: Vital Signs: There were no vitals taken for this visit.  Physical Exam She is alert and orient x3 and in no acute distress Ortho Exam On examination of her left knee with inspection of the skin there is no evidence of infection.  She lacks full extension maybe by just a few degrees and when I push her heart I can get her fully extended.  I can flex her back to just past 90 degrees.  There are some guarding with this.  The knee feels ligamentously stable on exam.  A lot of her pain is at the anterior proximal tibia.  The patella seems to track well.  I cannot elicit a clicking sensation that she does feel from time to time with her knee. Specialty Comments:  No specialty comments available.  Imaging: No results found. X-rays from November of this past year of her left knee are reviewed with her.  It shows a well-seated implant I am very pleased with the alignment overall the knee and the fit of the components themselves.  PMFS History: Patient Active Problem List   Diagnosis Date Noted  . History of left knee replacement 05/29/2019  . Macrocytic anemia 03/08/2019  . UTI (urinary tract infection) 03/04/2019  . Pain of left calf 01/26/2019  . AKI (acute kidney injury) ([place]) 01/25/2019  . Leg swelling 01/25/2019  . QT prolongation 01/25/2019  . Elevated d-dimer 01/23/2019  . Acute blood loss as cause of postoperative anemia 01/23/2019  . Cellulitis of leg,  left 01/23/2019  . At risk for adverse drug event 01/22/2019  . Bipolar 1 disorder ([place])   . 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 Osteoarthritis of left knee 12/30/2018  . Anxiety 12/30/2018  . Stage 3 chronic kidney disease 06/28/2016  . Hypercholesterolemia 08/20/2015  . Type 2 diabetes mellitus ([place]) 02/28/2014  . Esophageal reflux 02/28/2014  . Stress incontinence, female 02/28/2014  . Acquired hypothyroidism 02/28/2014  . Essential hypertension 11/17/2011   Past Medical History:  Diagnosis Date  . Bilateral lower extremity edema    right > left  . Bipolar 1 disorder ([place])   . CKD (chronic kidney disease) stage 3, GFR 30-59 ml/min     Dr Lawson Radar, [facility]  . Diabetes, polyneuropathy ([place])    FEET AND FINGER TIPS  . Fibromyalgia   . GERD (gastroesophageal reflux disease)   . History of chronic gastritis   . Hyperlipidemia   . Hypertension    per pt was take off bp medication because of kidney disease told by her nephrologist  . Hypothyroidism   . Mixed incontinence urge and stress   . OA (osteoarthritis)    KNEES  . OAB (overactive bladder)   . PONV (postoperative nausea and vomiting)   . 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 Type 2 diabetes mellitus treated with insulin ([place])   . Wears glasses     Family History  Problem Relation Age of Onset  . Breast cancer Mother 20  . Cancer Father        ? type  . Breast cancer Sister 47  . Breast cancer Maternal Grandmother        ? age    Past Surgical History:  Procedure Laterality Date  . CHOLECYSTECTOMY OPEN  1990's  . COLONOSCOPY  last one 07-17-2014  . CYSTOSCOPY WITH INJECTION N/A 10/29/2016   Procedure: CYSTOSCOPY WITH INJECTION BOTOX 100 UNITS, urethral dilation;  Surgeon: Carolan Clines, MD;  Location: [facility];  Service: Urology;  Laterality: N/A;  . ESOPHAGOGASTRODUODENOSCOPY  last one 07-01-2016  . EXCISIONAL BREAST BX  1990   BENIGN  . ORIF  TIBIA & FIBULA FRACTURES  2010   retained rod  . TONSILLECTOMY  age 66  . TOTAL KNEE ARTHROPLASTY Left 01/15/2019   Procedure: LEFT TOTAL KNEE ARTHROPLASTY;  Surgeon: Leandrew Koyanagi, MD;  Location: [facility];  Service: Orthopedics;  Laterality: Left;  Marland Kitchen VAGINAL HYSTERECTOMY  1979  . VENTRAL HERNIA REPAIR  1990's   Social History   Occupational History  . Not on file  Tobacco Use  .  Smoking status: Never Smoker  . Smokeless tobacco: Never Used  Substance and Sexual Activity  . Alcohol use: No  . Drug use: No  . Sexual activity: Not Currently    Comment: 1st intercourse 74 yo-Fewer than 5 partners

## 2019-07-11 ENCOUNTER — Ambulatory Visit: Payer: Medicare Other | Admitting: Orthopaedic Surgery

## 2019-07-16 IMAGING — CR CHEST - 2 VIEW
2 series · 2 of 2 positions shown · non-contrast
Comparison: Chest x-ray dated January 09, 2018.

CLINICAL DATA: Osteoarthritis.  Pain.

EXAM:
CHEST - 2 VIEW

[w chest pa]
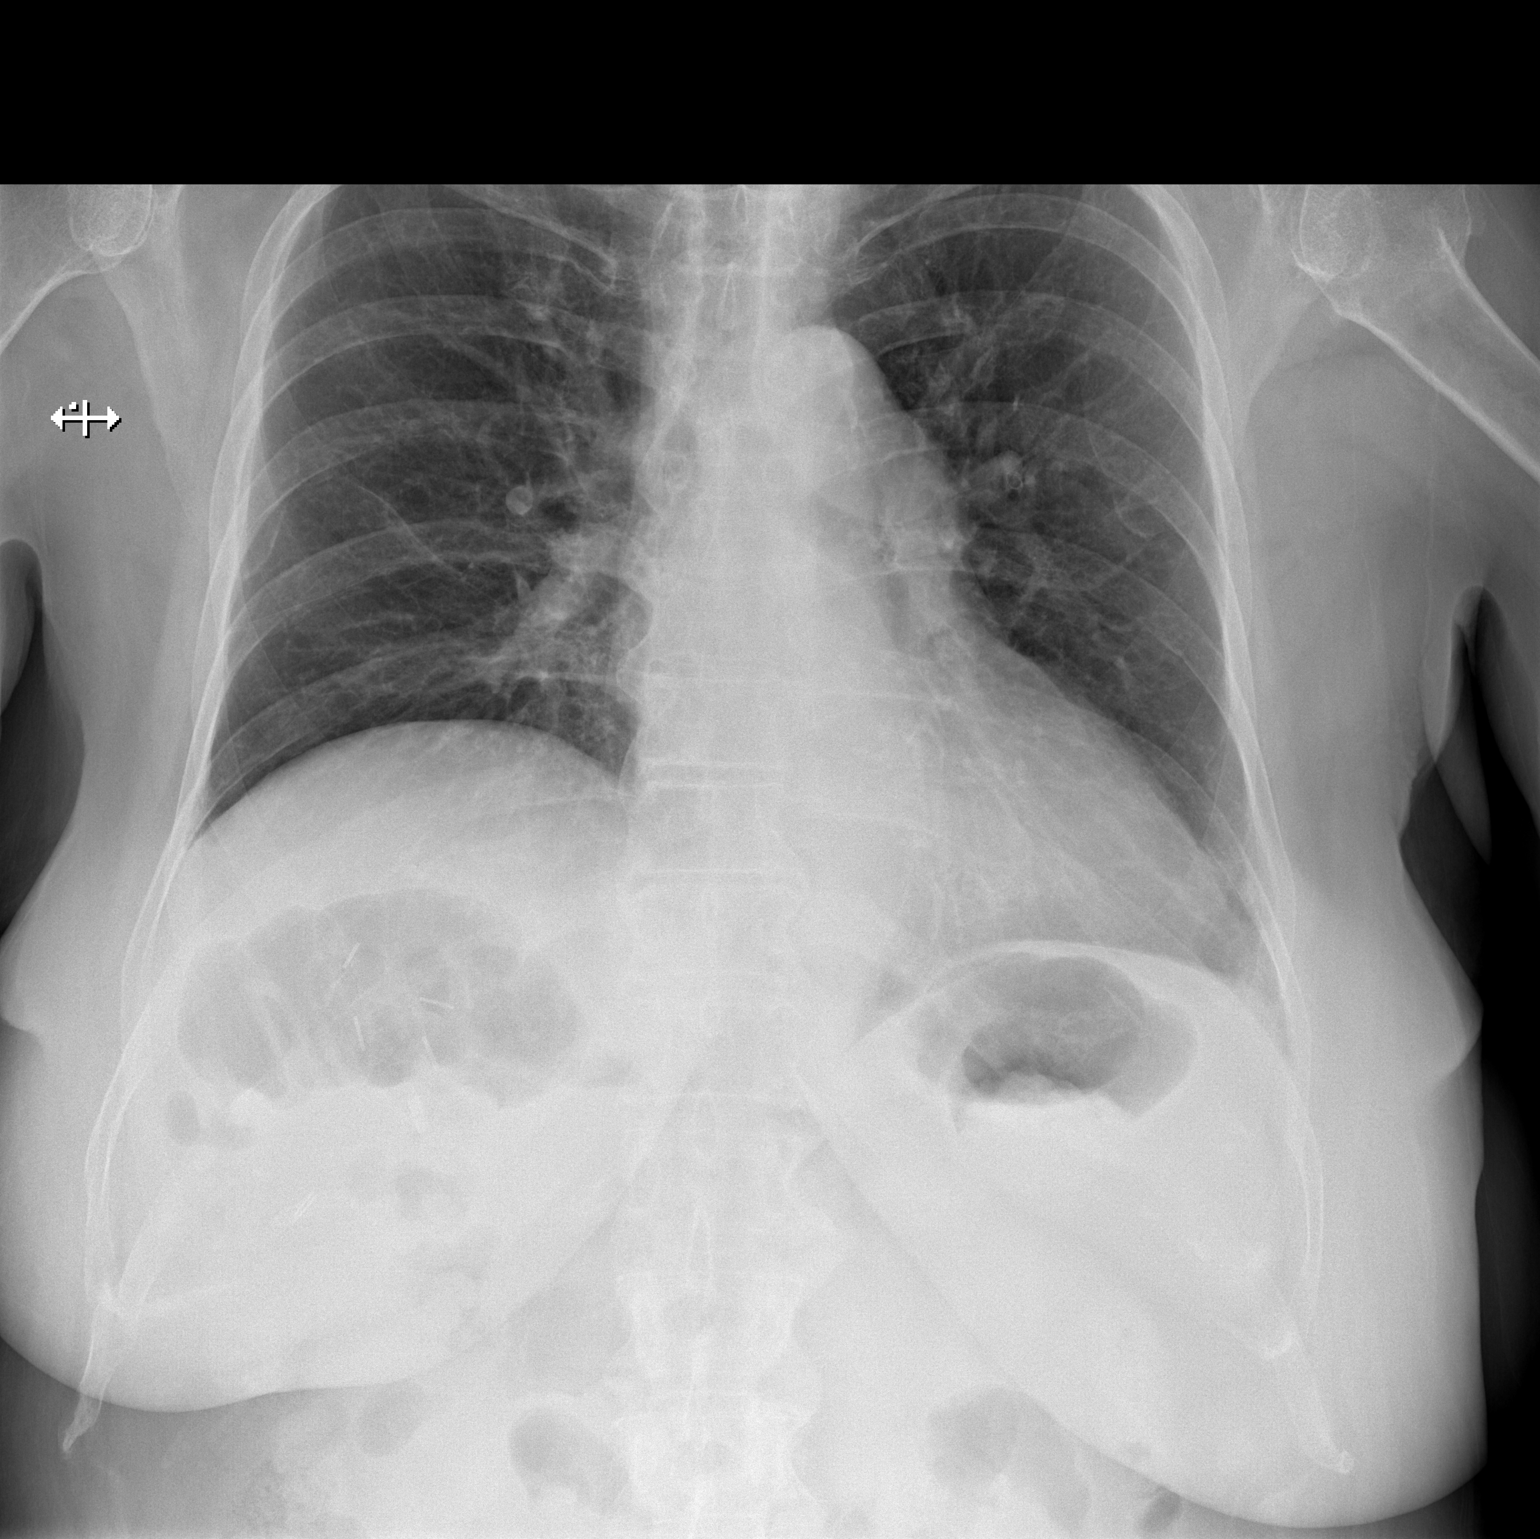

[w chest lat]
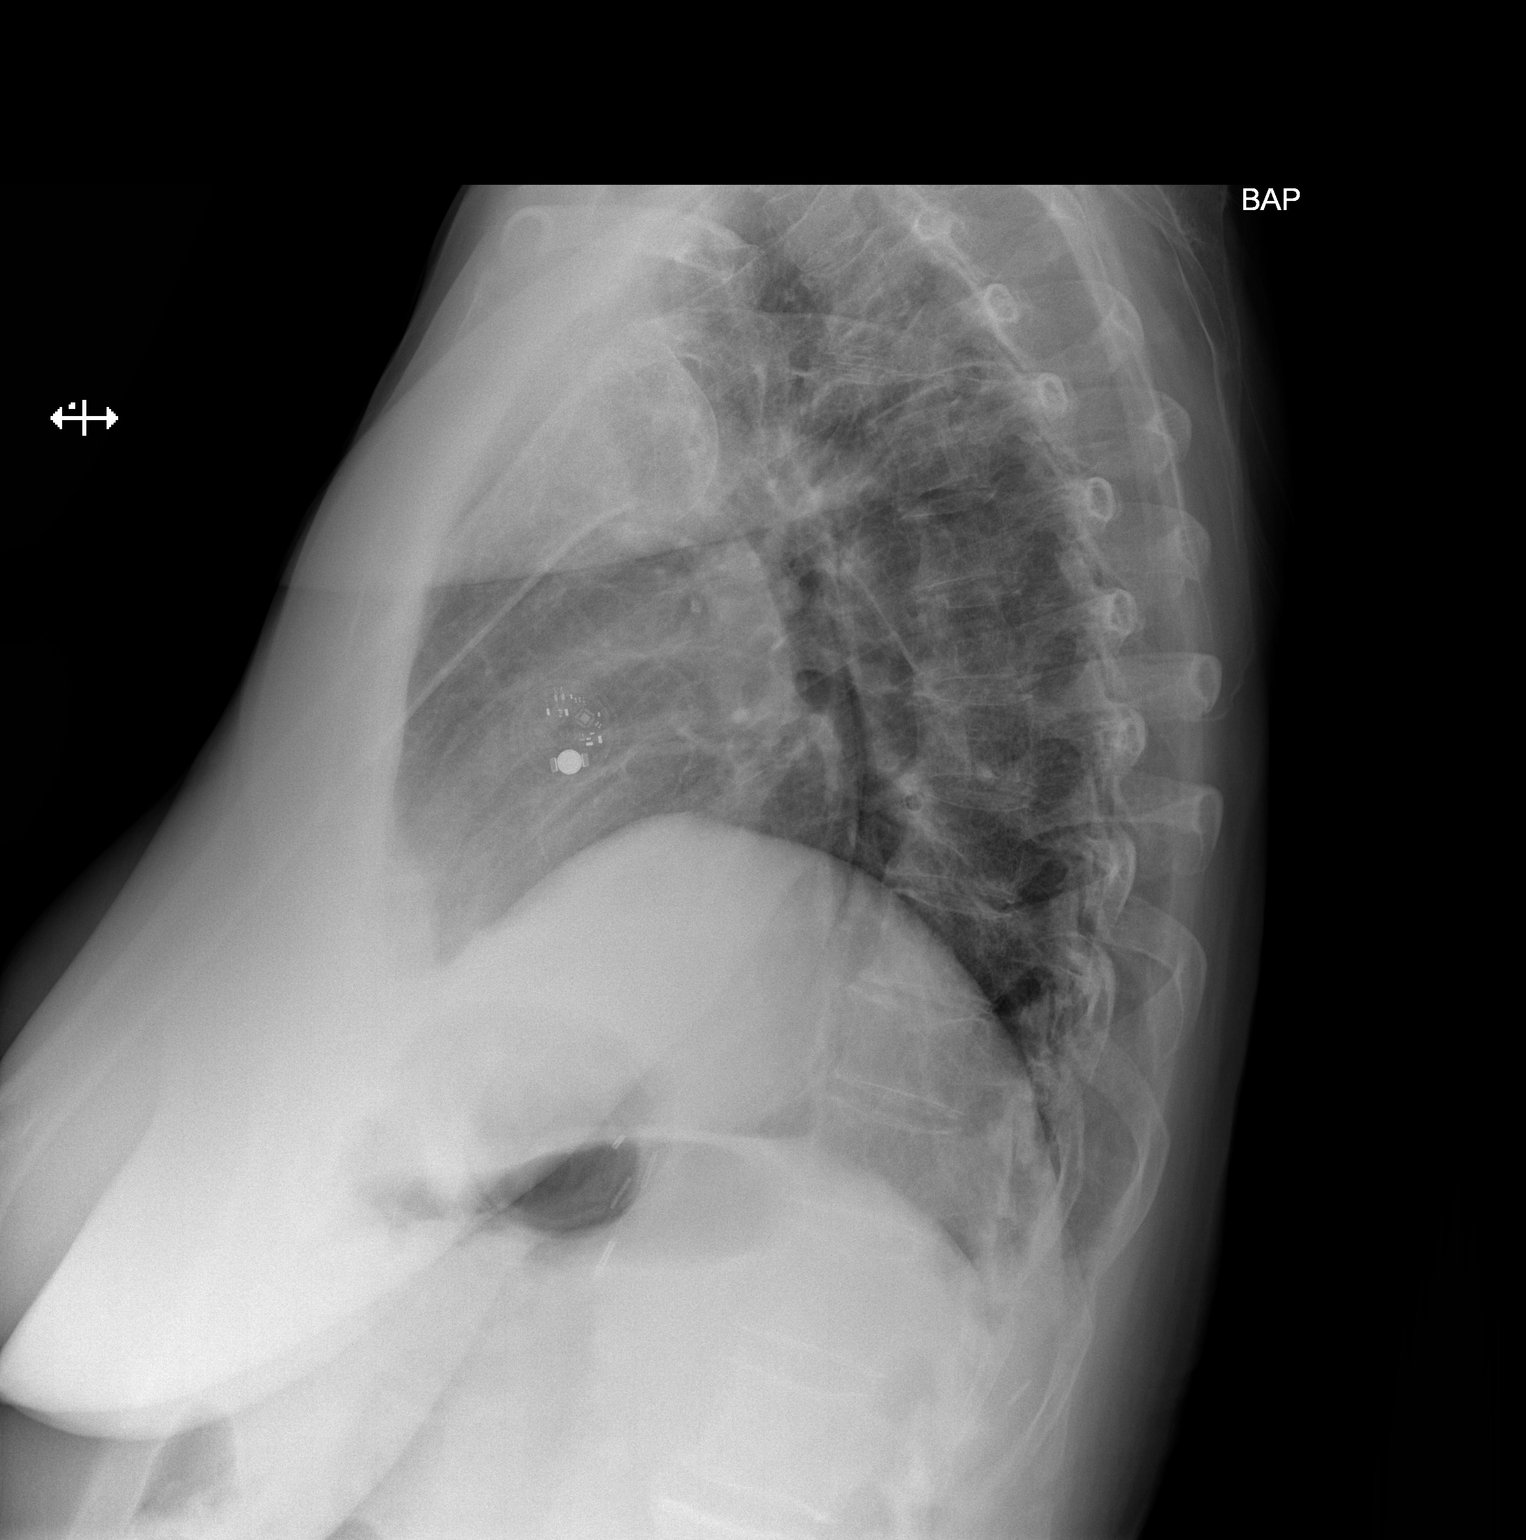

[2 of 2 positions shown; findings below may reference images not displayed]

FINDINGS: The heart size is stable. Aortic calcifications are noted. There is
no pneumothorax or large pleural effusion. There is a chronic
appearing deformity of the proximal left humerus. Degenerative
changes are noted of both glenohumeral joints.
IMPRESSION: No active cardiopulmonary disease.

## 2019-07-19 ENCOUNTER — Telehealth: Payer: Self-pay | Admitting: Unknown Physician Specialty

## 2019-07-19 ENCOUNTER — Telehealth: Payer: Self-pay | Admitting: *Deleted

## 2019-07-19 NOTE — Telephone Encounter (Signed)
F/U call -stated she will go to th ED in the morning when there can be someone with her husband. Reiterated to call 911 or EMS if her symptoms worsen - stated she will. And appreciated the call.

## 2019-07-19 NOTE — Telephone Encounter (Signed)
Spoke to pt this am on PH, states she had a +POSITIVE RESULT+ from [facility] yesterday at a [place] facility. She states the nurse there told her her O2 sats were in 80's and was sent home and told to call her pcp. She states she has chest pain, short of breath, cough and tired. Advised to go to nearest ED asap. Stated she would do so. Called her back after 1 1/2 hrs and she states she cant leave and does not have a ride, states her husband is sick. She is encouraged to call EMS and come to ED, states she will think about it

## 2019-07-19 NOTE — Telephone Encounter (Signed)
Patient called about Covid symptoms and the use of bamlanivimab, a monoclonal antibody infusion for those with mild to moderate Covid symptoms and at a high risk of hospitalization.  Long conversation with patient about onset of illness.  It seems she was exposed to [facility] 12/25.  ER notes indicated Covid type illness on 12/28 with a negative test. New cough 1/8 and friend noted pulse ox in the 80s yesterday.  She denies SOB.  Encouraged to call primary to discuss emergency care. Not a good candidate for mab infusion due to hypoxia

## 2019-07-24 ENCOUNTER — Other Ambulatory Visit: Payer: Self-pay | Admitting: Family Medicine

## 2019-08-07 ENCOUNTER — Ambulatory Visit (INDEPENDENT_AMBULATORY_CARE_PROVIDER_SITE_OTHER): Payer: Medicare Other | Admitting: Orthopaedic Surgery

## 2019-08-07 ENCOUNTER — Ambulatory Visit (INDEPENDENT_AMBULATORY_CARE_PROVIDER_SITE_OTHER): Payer: Medicare Other

## 2019-08-07 ENCOUNTER — Other Ambulatory Visit: Payer: Self-pay

## 2019-08-07 ENCOUNTER — Encounter: Payer: Self-pay | Admitting: Orthopaedic Surgery

## 2019-08-07 DIAGNOSIS — Z96652 Presence of left artificial knee joint: Secondary | ICD-10-CM

## 2019-08-07 NOTE — Progress Notes (Signed)
Office Visit Note   Patient: Toni Hill           Date of Birth: Oct 02, 1945           MRN: [id number] Visit Date: 08/07/2019              Requested by: Maudie Mercury, MD [address],  [place] [id number] PCP: Maudie Mercury, MD   Assessment & Plan: Visit Diagnoses:  1. Status post total left knee replacement     Plan: Based on the x-rays and her clinical findings think she may be having some pain between her medial patella and the knee replacement.  She does not have any effusion.  Based on discussion today we have agreed to continue to monitor this over the next 6 months as this has a good chance of improving.  Overall she is not really severely limited by this she is agreeable to see how this goes over the next 6 months.  We will see her back at that time with three-view x-rays of the left knee.  Follow-Up Instructions: Return in about 6 months (around 02/04/2020).   Orders:  Orders Placed This Encounter  Procedures  . XR KNEE 3 VIEW LEFT   No orders of the defined types were placed in this encounter.     Procedures: No procedures performed   Clinical Data: No additional findings.   Subjective: Chief Complaint  Patient presents with  . Left Knee - Pain    Toni Hill is 6 months status post left total knee replacement.  She is mainly complaining of anterior knee pain without swelling or mechanical symptoms.  She localizes the pain to the medial patellofemoral articulation.   Review of Systems  Constitutional: Negative.   HENT: Negative.   Eyes: Negative.   Respiratory: Negative.   Cardiovascular: Negative.   Endocrine: Negative.   Musculoskeletal: Negative.   Neurological: Negative.   Hematological: Negative.   Psychiatric/Behavioral: Negative.   All other systems reviewed and are negative.    Objective: Vital Signs: There were no vitals taken for this visit.  Physical Exam Vitals and nursing note reviewed.  Constitutional:    Appearance: She is well-developed.  Pulmonary:     Effort: Pulmonary effort is normal.  Skin:    General: Skin is warm.     Capillary Refill: Capillary refill takes less than 2 seconds.  Neurological:     Mental Status: She is alert and oriented to person, place, and time.  Psychiatric:        Behavior: Behavior normal.        Thought Content: Thought content normal.        Judgment: Judgment normal.     Ortho Exam Left knee exam shows fully healed surgical scar.  She has full range of motion with significant patellofemoral crepitus.  She is tender along the medial patellofemoral articulation.  Patella tracking is normal. Specialty Comments:  No specialty comments available.  Imaging: XR KNEE 3 VIEW LEFT  Result Date: 08/07/2019 Stable total knee replacement without complication.    PMFS History: Patient Active Problem List   Diagnosis Date Noted  . History of left knee replacement 05/29/2019  . Macrocytic anemia 03/08/2019  . UTI (urinary tract infection) 03/04/2019  . Pain of left calf 01/26/2019  . AKI (acute kidney injury) ([place]) 01/25/2019  . Leg swelling 01/25/2019  . QT prolongation 01/25/2019  . Elevated d-dimer 01/23/2019  . Acute blood loss as cause of postoperative anemia 01/23/2019  .  Cellulitis of leg, left 01/23/2019  . At risk for adverse drug event 01/22/2019  . Bipolar 1 disorder ([place])   . 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 Osteoarthritis of left knee 12/30/2018  . Anxiety 12/30/2018  . Stage 3 chronic kidney disease 06/28/2016  . Hypercholesterolemia 08/20/2015  . Type 2 diabetes mellitus ([place]) 02/28/2014  . Esophageal reflux 02/28/2014  . Stress incontinence, female 02/28/2014  . Acquired hypothyroidism 02/28/2014  . Essential hypertension 11/17/2011   Past Medical History:  Diagnosis Date  . Bilateral lower extremity edema    right > left  . Bipolar 1 disorder ([place])   . CKD (chronic kidney disease) stage 3, GFR 30-59  ml/min     Dr Lawson Radar, [facility]  . Diabetes, polyneuropathy ([place])    FEET AND FINGER TIPS  . Fibromyalgia   . GERD (gastroesophageal reflux disease)   . History of chronic gastritis   . Hyperlipidemia   . Hypertension    per pt was take off bp medication because of kidney disease told by her nephrologist  . Hypothyroidism   . Mixed incontinence urge and stress   . OA (osteoarthritis)    KNEES  . OAB (overactive bladder)   . PONV (postoperative nausea and vomiting)   . 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 Type 2 diabetes mellitus treated with insulin ([place])   . Wears glasses     Family History  Problem Relation Age of Onset  . Breast cancer Mother 12  . Cancer Father        ? type  . Breast cancer Sister 3  . Breast cancer Maternal Grandmother        ? age    Past Surgical History:  Procedure Laterality Date  . CHOLECYSTECTOMY OPEN  1990's  . COLONOSCOPY  last one 07-17-2014  . CYSTOSCOPY WITH INJECTION N/A 10/29/2016   Procedure: CYSTOSCOPY WITH INJECTION BOTOX 100 UNITS, urethral dilation;  Surgeon: Carolan Clines, MD;  Location: [facility];  Service: Urology;  Laterality: N/A;  . ESOPHAGOGASTRODUODENOSCOPY  last one 07-01-2016  . EXCISIONAL BREAST BX  1990   BENIGN  . ORIF TIBIA & FIBULA FRACTURES  2010   retained rod  . TONSILLECTOMY  age 3  . TOTAL KNEE ARTHROPLASTY Left 01/15/2019   Procedure: LEFT TOTAL KNEE ARTHROPLASTY;  Surgeon: Leandrew Koyanagi, MD;  Location: [facility];  Service: Orthopedics;  Laterality: Left;  Marland Kitchen VAGINAL HYSTERECTOMY  1979  . VENTRAL HERNIA REPAIR  1990's   Social History   Occupational History  . Not on file  Tobacco Use  . Smoking status: Never Smoker  . Smokeless tobacco: Never Used  Substance and Sexual Activity  . Alcohol use: No  . Drug use: No  . Sexual activity: Not Currently    Comment: 1st intercourse 74 yo-Fewer than 5 partners

## 2019-08-13 ENCOUNTER — Ambulatory Visit (INDEPENDENT_AMBULATORY_CARE_PROVIDER_SITE_OTHER): Payer: Medicare Other | Admitting: Internal Medicine

## 2019-08-13 ENCOUNTER — Other Ambulatory Visit: Payer: Self-pay

## 2019-08-13 ENCOUNTER — Encounter: Payer: Self-pay | Admitting: Internal Medicine

## 2019-08-13 VITALS — BP 146/59 | HR 88 | Temp 98.0°F | Ht 69.0 in | Wt 222.5 lb

## 2019-08-13 DIAGNOSIS — Z794 Long term (current) use of insulin: Secondary | ICD-10-CM

## 2019-08-13 DIAGNOSIS — R110 Nausea: Secondary | ICD-10-CM | POA: Diagnosis not present

## 2019-08-13 DIAGNOSIS — E119 Type 2 diabetes mellitus without complications: Secondary | ICD-10-CM | POA: Diagnosis present

## 2019-08-13 DIAGNOSIS — Z96653 Presence of artificial knee joint, bilateral: Secondary | ICD-10-CM | POA: Diagnosis not present

## 2019-08-13 DIAGNOSIS — E1140 Type 2 diabetes mellitus with diabetic neuropathy, unspecified: Secondary | ICD-10-CM

## 2019-08-13 LAB — POCT GLYCOSYLATED HEMOGLOBIN (HGB A1C): Hemoglobin A1C: 8.3 % — AB (ref 4.0–5.6)

## 2019-08-13 LAB — GLUCOSE, CAPILLARY: Glucose-Capillary: 142 mg/dL — ABNORMAL HIGH (ref 70–99)

## 2019-08-13 MED ORDER — BASAGLAR KWIKPEN 100 UNIT/ML ~~LOC~~ SOPN: 25.0000 [IU] | PEN_INJECTOR | Freq: Every day | SUBCUTANEOUS | 1 refills | Status: DC

## 2019-08-13 NOTE — Progress Notes (Signed)
   CC: Diabetes  HPI:  [name] is a 74 y.o. female, with a PMH noted below, who presents for follow up for her diabetes management. To see the acute and chronic management of this patient's conditions, please see the separate assessment and plan under the encounters tab.   Past Medical History:  Diagnosis Date  . Bilateral lower extremity edema    right > left  . Bipolar 1 disorder ([place])   . CKD (chronic kidney disease) stage 3, GFR 30-59 ml/min     Dr Toni Hill, [facility]  . Diabetes, polyneuropathy ([place])    FEET AND FINGER TIPS  . Fibromyalgia   . GERD (gastroesophageal reflux disease)   . History of chronic gastritis   . Hyperlipidemia   . Hypertension    per pt was take off bp medication because of kidney disease told by her nephrologist  . Hypothyroidism   . Mixed incontinence urge and stress   . OA (osteoarthritis)    KNEES  . OAB (overactive bladder)   . PONV (postoperative nausea and vomiting)   . 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 Type 2 diabetes mellitus treated with insulin ([place])   . Wears glasses    Review of Systems:   Review of Systems  Constitutional: Negative for chills, fever and weight loss.       Endorses 2 pound weight gain.   Respiratory: Negative for cough, sputum production and wheezing.   Cardiovascular: Negative for chest pain.  Gastrointestinal: Positive for nausea. Negative for abdominal pain, constipation, diarrhea and vomiting.       Occasional nausea episodes.   Musculoskeletal: Positive for joint pain.       Knee pain from her knee replacements.   Neurological: Negative for headaches.    Physical Exam:  Vitals:   08/13/19 1556  BP: (!) 146/59  Pulse: 88  Temp: 98 F (36.7 C)  TempSrc: Oral  SpO2: 97%  Weight: 222 lb 8 oz (100.9 kg)  Height: 5\' 9"  (1.753 m)   Physical Exam Vitals reviewed.  Constitutional:      General: She is not in acute distress.    Appearance: Normal appearance. She is not  ill-appearing or toxic-appearing.  HENT:     Head: Normocephalic and atraumatic.  Cardiovascular:     Rate and Rhythm: Normal rate and regular rhythm.     Pulses: Normal pulses.     Heart sounds: Normal heart sounds. No murmur. No friction rub. No gallop.   Pulmonary:     Effort: Pulmonary effort is normal.     Breath sounds: Normal breath sounds. No wheezing, rhonchi or rales.  Abdominal:     General: Abdomen is flat. Bowel sounds are normal. There is no distension.     Palpations: Abdomen is soft.     Tenderness: There is no abdominal tenderness. There is no guarding.  Neurological:     Mental Status: She is alert and oriented to person, place, and time.  Psychiatric:        Mood and Affect: Mood normal.        Behavior: Behavior normal.     Assessment & Plan:   See Encounters Tab for problem based charting.  Patient discussed with Dr. Lynnae January

## 2019-08-13 NOTE — Patient Instructions (Addendum)
To Toni Hill, It was a pleasure seeing you today! Today we discussed your diabetes management. Your A1C was 8.3 at today, we will refer you over to our diabetic educator, Ms. Butch Penny. Please continue taking your medications as directed in the meantime. I will schedule an appointment for 3 month's time to reevaluate your blood sugars. I look forward to seeing you again, be safe! Sincerely,  Maudie Mercury

## 2019-08-13 NOTE — Assessment & Plan Note (Addendum)
Patient with history of diabetes comes to the [facility] for reassessment after her previous visit with an elevated A1c of 7.5. 08/13/19 A1c is 8.3. Patient states that she does follow her basaglar and novolog regiment. Patient states she is interested in trying oral medications for better management of her diabetes.  Plan:  - Referral to Nutrition and Diabetes Services to discuss if patient is a candidate for GLP-1 Agonsit or if insurance covers medication.  - Refill current Basaglar dosage.  - Patient instructed to continue current medication regiment. Patient to take injections before meals.  - Will follow up in three months time for reassessment.  - she is using CGM to manage her blood sugars testing more than 4 times a day and is on insulin. The CGM is used to adjust her blood sugars and insulin.

## 2019-08-16 NOTE — Progress Notes (Signed)
Internal Medicine Clinic Attending  Case discussed with Dr. Winters at the time of the visit.  We reviewed the resident's history and exam and pertinent patient test results.  I agree with the assessment, diagnosis, and plan of care documented in the resident's note.  

## 2019-08-17 LAB — HM DIABETES EYE EXAM

## 2019-08-20 ENCOUNTER — Other Ambulatory Visit: Payer: Self-pay

## 2019-08-20 ENCOUNTER — Other Ambulatory Visit: Payer: Self-pay | Admitting: Internal Medicine

## 2019-08-20 ENCOUNTER — Ambulatory Visit (INDEPENDENT_AMBULATORY_CARE_PROVIDER_SITE_OTHER): Payer: Medicare Other | Admitting: Internal Medicine

## 2019-08-20 VITALS — BP 108/54 | HR 87 | Temp 97.8°F | Ht 69.0 in | Wt 219.8 lb

## 2019-08-20 DIAGNOSIS — R634 Abnormal weight loss: Secondary | ICD-10-CM

## 2019-08-20 DIAGNOSIS — Z794 Long term (current) use of insulin: Secondary | ICD-10-CM | POA: Diagnosis not present

## 2019-08-20 DIAGNOSIS — Z0389 Encounter for observation for other suspected diseases and conditions ruled out: Secondary | ICD-10-CM

## 2019-08-20 DIAGNOSIS — N6320 Unspecified lump in the left breast, unspecified quadrant: Secondary | ICD-10-CM | POA: Diagnosis not present

## 2019-08-20 DIAGNOSIS — D126 Benign neoplasm of colon, unspecified: Secondary | ICD-10-CM

## 2019-08-20 DIAGNOSIS — L821 Other seborrheic keratosis: Secondary | ICD-10-CM | POA: Insufficient documentation

## 2019-08-20 DIAGNOSIS — E1140 Type 2 diabetes mellitus with diabetic neuropathy, unspecified: Secondary | ICD-10-CM

## 2019-08-20 LAB — GLUCOSE, CAPILLARY: Glucose-Capillary: 77 mg/dL (ref 70–99)

## 2019-08-20 NOTE — Progress Notes (Signed)
   CC: Diffuse seborrheic keratosis  HPI:  [name] is a 74 y.o. community dwelling woman with medical history listed below presenting for evaluation of dizziness and blurry keratosis.  Please see problem based charting for further details.  Past Medical History:  Diagnosis Date  . Bilateral lower extremity edema    right > left  . Bipolar 1 disorder ([place])   . CKD (chronic kidney disease) stage 3, GFR 30-59 ml/min     Dr Toni Hill, [facility]  . Diabetes, polyneuropathy ([place])    FEET AND FINGER TIPS  . Fibromyalgia   . GERD (gastroesophageal reflux disease)   . History of chronic gastritis   . Hyperlipidemia   . Hypertension    per pt was take off bp medication because of kidney disease told by her nephrologist  . Hypothyroidism   . Mixed incontinence urge and stress   . OA (osteoarthritis)    KNEES  . OAB (overactive bladder)   . PONV (postoperative nausea and vomiting)   . 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 Type 2 diabetes mellitus treated with insulin ([place])   . Wears glasses    Review of Systems:  As per HPI  Physical Exam:  Vitals:   08/20/19 1504  BP: (!) 108/54  Pulse: 87  Temp: 97.8 F (36.6 C)  TempSrc: Oral  SpO2: 96%  Weight: 219 lb 12.8 oz (99.7 kg)  Height: 5\' 9"  (1.753 m)   Physical Exam  Constitutional: She is well-developed, well-nourished, and in no distress. No distress.  HENT:  Head: Normocephalic and atraumatic.  Cardiovascular: Normal rate, regular rhythm and normal heart sounds.  No murmur heard. Pulmonary/Chest: Effort normal and breath sounds normal. She has no wheezes. Right breast exhibits no inverted nipple. Left breast exhibits mass and skin change (Seborrheic keratosis). Left breast exhibits no inverted nipple, no nipple discharge and no tenderness. Breasts are symmetrical.    Lymphadenopathy:    She has no axillary adenopathy.       Right axillary: No pectoral and no lateral adenopathy present.    Left axillary: No pectoral and no lateral adenopathy present. Skin: She is not diaphoretic.    Assessment & Plan:   See Encounters Tab for problem based charting.  Patient discussed with Dr. Lynnae January

## 2019-08-20 NOTE — Assessment & Plan Note (Signed)
Diffuse seborrheic keratosis: Toni Hill initially presented for dermatology referral for seborrheic keratosis on her back and also on her left breast.  She does not recall when these lesions started to appear and enies pruritus.  On further questioning and chart review, she had a colonoscopy with polypectomy performed under anesthesia in 2016 which revealed tubular adenoma.  She was instructed to perform a repeat colonoscopy after 3 years however patient had opted to postpone the procedure.  She denies hematochezia or bright red blood per rectum.  She does have about a 48 pound unintentional weight loss since March 2018 and has a significant family history of breast cancer (sister, mother and grandmother).    Plan: -Referral for colonoscopy  -Referral to dermatology

## 2019-08-20 NOTE — Assessment & Plan Note (Signed)
Left breast mass: Given that she had seborrheic keratosis on her left breast, a breast examination was performed with chaperone and I could palpate a possible left breast mass at that 2-4 o'clock position.  No evidence of peau d'orange.  She did have dense breasts which made the examination challenging. She does have about a 48 pound unintentional weight loss since March 2018 and has a significant family history of breast cancer (sister who recently passed, mother and grandmother).  Plan: -Referred for diagnostic mammogram

## 2019-08-20 NOTE — Patient Instructions (Signed)
[name],   Thanks for seeing [place] today. I am referring you to get a colonoscopy, mammogram and dermatologist.   Take care! Dr. Eileen Stanford  Please call the internal medicine center clinic if you have any questions or concerns, we may be able to help and keep you from a long and expensive emergency room wait. Our clinic and after hours phone number is [phone], the best time to call is Monday through Friday 9 am to 4 pm but there is always someone available 24/7 if you have an emergency. If you need medication refills please notify your pharmacy one week in advance and they will send [place] a request.

## 2019-08-21 ENCOUNTER — Other Ambulatory Visit: Payer: Self-pay | Admitting: Internal Medicine

## 2019-08-21 ENCOUNTER — Telehealth: Payer: Self-pay | Admitting: Internal Medicine

## 2019-08-21 NOTE — Telephone Encounter (Signed)
Rec'd call from patient seen in [facility] 08/20/2019 stating she does not want to use [facility].  The patient is urgently requesting to use an External office [facility]  Please advise if a new order can be placed for the requested site.

## 2019-08-21 NOTE — Telephone Encounter (Signed)
Please review these meds and especially directions for gabapentin for clearer dosing, these had been done by another provider

## 2019-08-21 NOTE — Telephone Encounter (Signed)
Thanks. I have made the changes

## 2019-08-21 NOTE — Addendum Note (Signed)
Addended by: Jean Rosenthal on: 08/21/2019 10:59 AM   Modules accepted: Orders

## 2019-08-21 NOTE — Telephone Encounter (Signed)
Refill Request  gabapentin (NEURONTIN) 100 MG capsule  insulin aspart (NOVOLOG) 100 UNIT/ML injection  ondansetron (ZOFRAN) 4 MG tabletondansetron (ZOFRAN) 4 MG tablet  HYDROcodone-acetaminophen (NORCO) 5-325 MG tablet  diclofenac Sodium (VOLTAREN) 1 % GEL  [id number] [place], [place] [id number] [place], [place]

## 2019-08-21 NOTE — Telephone Encounter (Signed)
Orders will be faxed to [facility].

## 2019-08-22 NOTE — Progress Notes (Signed)
Internal Medicine Clinic Attending  Case discussed with Dr. Agyei at the time of the visit.  We reviewed the resident's history and exam and pertinent patient test results.  I agree with the assessment, diagnosis, and plan of care documented in the resident's note.    

## 2019-08-22 NOTE — Telephone Encounter (Signed)
Requesting to speak with a nurse about   baclofen (LIORESAL) 10 MG tablet   Please call pt back.

## 2019-08-24 ENCOUNTER — Telehealth: Payer: Self-pay | Admitting: Dietician

## 2019-08-24 NOTE — Telephone Encounter (Signed)
Called [name] per Dr. Lynnae January and referral for diabetes self management education. She is not at home. Agrees to a call on Monday.appointment scheduled for 1015 am on 08/27/19.  She also asks that I relay a message to Dr. Gilford Rile: she called where she recently had a colonoscopy. She had one in July 2019 colonoscopy at [facility].  does not think she is due again. Please advise.

## 2019-08-27 ENCOUNTER — Ambulatory Visit: Payer: Medicare Other | Admitting: Dietician

## 2019-08-27 MED ORDER — GABAPENTIN 100 MG PO CAPS: 100.0000 mg | ORAL_CAPSULE | Freq: Two times a day (BID) | ORAL | 2 refills | Status: DC

## 2019-08-27 MED ORDER — DICLOFENAC SODIUM 1 % EX GEL: 2.0000 g | Freq: Four times a day (QID) | CUTANEOUS | 1 refills | Status: DC

## 2019-08-27 MED ORDER — INSULIN ASPART 100 UNIT/ML ~~LOC~~ SOLN: 0.0000 [IU] | Freq: Three times a day (TID) | SUBCUTANEOUS | 1 refills | Status: DC

## 2019-08-27 NOTE — Telephone Encounter (Signed)
Pls phone in insulin. Butch Penny working with her to stop prandial insulin [facility] is not Rxing the hydrocodone or Zofran Resumed [facility] dose of gabapentin

## 2019-08-27 NOTE — Telephone Encounter (Signed)
Called insulin in

## 2019-08-27 NOTE — Telephone Encounter (Signed)
A referral has already been placed to her GI to f/u with her last Colonoscopy.  Patient is aware that their office will be reaching out to her in reference to this request previously made by the Physician. The patient also provided the office she wanted to send her Referral Request to as well.  Please refer to the Referral notes placed.

## 2019-08-28 ENCOUNTER — Telehealth: Payer: Self-pay | Admitting: Licensed Clinical Social Worker

## 2019-08-28 ENCOUNTER — Telehealth: Payer: Self-pay | Admitting: *Deleted

## 2019-08-28 NOTE — Telephone Encounter (Signed)
Patient was called due to requesting to schedule an appointment with me. Patient did not answer, and a vm was left for the patient to contact our office to schedule.

## 2019-08-28 NOTE — Telephone Encounter (Signed)
Pt calls and states she needs at least #90 baclofen per month. She states her legs are killing her and its the only thing that helps. She has not picked up the script from 07/2019 and can hardly walk please consider a script for #90 per month. She states you can call her to discuss this  Also she states her blood sugar dropped to 48 mid morning today, she was ask if she had eaten and she stated yes her usual cream cheese and olive , 2 pcs of toast. She states her anxiety and pain probably did this, triage suggested that she probably needed to speak with donnap. For a review of her diet. Sending to donna to call pt today. She refuses an appt at this time. Reminded her of appt w/ donnna 2/25

## 2019-08-29 ENCOUNTER — Telehealth: Payer: Self-pay | Admitting: Dietician

## 2019-08-29 ENCOUNTER — Telehealth: Payer: Self-pay | Admitting: Family Medicine

## 2019-08-29 MED ORDER — BACLOFEN 10 MG PO TABS: 5.0000 mg | ORAL_TABLET | Freq: Three times a day (TID) | ORAL | 3 refills | Status: DC | PRN

## 2019-08-29 NOTE — Telephone Encounter (Signed)
Please advise 

## 2019-08-29 NOTE — Telephone Encounter (Signed)
Spoke to pt, gave her info of who to call, she is agreeable

## 2019-08-29 NOTE — Telephone Encounter (Signed)
Patient called and requested a refill of baclofen.  Please call patient to advise.  [phone]

## 2019-08-29 NOTE — Telephone Encounter (Signed)
Sent!

## 2019-08-29 NOTE — Telephone Encounter (Signed)
To all,  I hope you are well. I reached out to patient regarding the baclofen prescription. She was prescribed this medication by Dr. Junius Roads with [facility]. Patient has prescription at her pharmacy. When I called I was led to the patient's voicemail. Left a voicemail stating that she will need to follow up with the prescribing physician for that medication, and left a reminder about her upcoming appointment on 08/30/2019. Have a pleasant day.  Sincerely,  Maudie Mercury, MD

## 2019-08-29 NOTE — Telephone Encounter (Signed)
Late entry for Monday call to set up an appointment per referral. She set up a telehealth appointment for Thursday. She agreed to record blood sugars to give me and look into learning how to check her email so she might possible be abel to upload her [employer] CGM from home.   Per request I also informed Ms. Gates that she needs to obtain the results from [facility] herself or sign a release for [place] to request the records. She acknowledged this information saying she would call to  request them herself. This way her doctor can see the results and answer her question better/decide what's next.

## 2019-08-29 NOTE — Telephone Encounter (Signed)
I called and advised the patient her Baclofen has been sent in to her pharmacy.

## 2019-08-30 ENCOUNTER — Ambulatory Visit: Payer: Medicare Other | Admitting: Dietician

## 2019-08-30 ENCOUNTER — Encounter: Payer: Self-pay | Admitting: Dietician

## 2019-08-30 DIAGNOSIS — Z794 Long term (current) use of insulin: Secondary | ICD-10-CM

## 2019-08-30 DIAGNOSIS — E1140 Type 2 diabetes mellitus with diabetic neuropathy, unspecified: Secondary | ICD-10-CM

## 2019-08-30 MED ORDER — SEMAGLUTIDE(0.25 OR 0.5MG/DOS) 2 MG/1.5ML ~~LOC~~ SOPN: 0.2500 mg | PEN_INJECTOR | SUBCUTANEOUS | 0 refills | Status: DC

## 2019-08-30 MED ORDER — BASAGLAR KWIKPEN 100 UNIT/ML ~~LOC~~ SOPN: 20.0000 [IU] | PEN_INJECTOR | Freq: Every day | SUBCUTANEOUS | 1 refills | Status: DC

## 2019-08-30 NOTE — Progress Notes (Signed)
  Diabetes Management Appointment Via Telemedicine:  Appt start time: 7371 end time:  1440. Total time: 25 Visit # 1  08/30/2019 Toni Hill [id number]  Telephone visit due to COVID-19.  This is a telephone encounter between [name]  and [name]  on 08/30/2019 for diabetes management. The visit was conducted with the patient located at home and [name]  at [facility]. The patient's identity was confirmed using their DOB and current address. The patient has consented to being evaluated through a telephone encounter and understands the associated risks / benefits (allows the patient to remain at home, decreasing exposure to coronavirus). I personally spent 15 minutes on diabetes therapy discussion.   The following statements were read to the patient and/or legal guardian that are established with the Diabetes and Nutritional Health Provider.   "The purpose of this phone visit is to provide diabetes health care while limiting exposure to the coronavirus (COVID19).  There is a possibility of technology failure and discussed alternative modes of communication if that failure occurs."   "By engaging in this telephone visit, you consent to the provision of healthcare.  Additionally, you authorize for your insurance to be billed for the services provided during this telephone visit."    Patient and/or legal guardian consented to telephone visit: yes   Assessment:  Primary concerns today: improved blood sugar control- less variabillity. "I think it is stress making them go all over the place".  [name] was on her way out of town to live with her sister in [place]. She seemed a bit stressed, so we kept today's visit brief. She reports the following blood sugars and hypoglycemia symptoms with both the 49 and 55 blood sugars.   Date   Breakfast Lunch  Dinner   Bedtime 2/25 128 2/24 122 162 98 109    178 2/23 104  49-ate 220 116  144 158 2/22 161  114  145   55-ate 2/21 134    282   212 167 2/20 156 120 118  183 153  114   Medicine: per discussion with Dr. Gilford Rile and patient; she verbalized understanding by repeating back instructions to  1- stop Novolog after today 2- Start semaglutide 0.25 mg tomorrow and each Friday  Thereafter 3- Reduce her basalgar to 20 units daily which she takes in the morning  She was advised about actions and side effects of the semaglutide, keeping her meter and treatment for low blood sugars with her and continuing to record blood sugars at least 4 times a day. This information will be sent to her via Mychart and a 1 week follow up was scheduled.  [name], RD 08/30/2019 2:45 PM.

## 2019-08-30 NOTE — Patient Instructions (Addendum)
Toni Hill,   It was nice to speak with you today. The following are  What we talked about today  1- stop taking Novolog insulin at meals after today 2- reduce your basalgar insulin to 20 units every morning 3- Start taking semaglutide(Ozempic) 0.25 mg once a week 4- continue to do the fabulous job of checking and recording your blood sugars  We have a follow up phone cal in 1 week on March 4 at 1:15 PM.  We will discuss how you are doing with the diabetes medicines and what your blood sugars have been.   Please do not hesitate to call me with questions!  Butch Penny [phone]  Semaglutide injection solution What is this medicine? SEMAGLUTIDE (Sem a GLOO tide) is used to improve blood sugar control in adults with type 2 diabetes. This medicine may be used with other diabetes medicines. This drug may also reduce the risk of heart attack or stroke if you have type 2 diabetes and risk factors for heart disease. This medicine may be used for other purposes; ask your health care provider or pharmacist if you have questions. COMMON BRAND NAME(S): OZEMPIC What should I tell my health care provider before I take this medicine? They need to know if you have any of these conditions:  endocrine tumors (MEN 2) or if someone in your family had these tumors  eye disease, vision problems  history of pancreatitis  kidney disease  stomach problems  thyroid cancer or if someone in your family had thyroid cancer  an unusual or allergic reaction to semaglutide, other medicines, foods, dyes, or preservatives  pregnant or trying to get pregnant  breast-feeding How should I use this medicine? This medicine is for injection under the skin of your upper leg (thigh), stomach area, or upper arm. It is given once every week (every 7 days). You will be taught how to prepare and give this medicine. Use exactly as directed. Take your medicine at regular intervals. Do not take it more often than  directed. If you use this medicine with insulin, you should inject this medicine and the insulin separately. Do not mix them together. Do not give the injections right next to each other. Change (rotate) injection sites with each injection. It is important that you put your used needles and syringes in a special sharps container. Do not put them in a trash can. If you do not have a sharps container, call your pharmacist or healthcare provider to get one. A special MedGuide will be given to you by the pharmacist with each prescription and refill. Be sure to read this information carefully each time. This drug comes with INSTRUCTIONS FOR USE. Ask your pharmacist for directions on how to use this drug. Read the information carefully. Talk to your pharmacist or health care provider if you have questions. Talk to your pediatrician regarding the use of this medicine in children. Special care may be needed. Overdosage: If you think you have taken too much of this medicine contact a poison control center or emergency room at once. NOTE: This medicine is only for you. Do not share this medicine with others. What if I miss a dose? If you miss a dose, take it as soon as you can within 5 days after the missed dose. Then take your next dose at your regular weekly time. If it has been longer than 5 days after the missed dose, do not take the missed dose. Take the next dose at your regular time. Do  not take double or extra doses. If you have questions about a missed dose, contact your health care provider for advice. What may interact with this medicine?  other medicines for diabetes Many medications may cause changes in blood sugar, these include:  alcohol containing beverages  antiviral medicines for HIV or AIDS  aspirin and aspirin-like drugs  certain medicines for blood pressure, heart disease, irregular heart beat  chromium  diuretics  female hormones, such as estrogens or progestins, birth control  pills  fenofibrate  gemfibrozil  isoniazid  lanreotide  female hormones or anabolic steroids  MAOIs like Carbex, Eldepryl, Marplan, Nardil, and Parnate  medicines for weight loss  medicines for allergies, asthma, cold, or cough  medicines for depression, anxiety, or psychotic disturbances  niacin  nicotine  NSAIDs, medicines for pain and inflammation, like ibuprofen or naproxen  octreotide  pasireotide  pentamidine  phenytoin  probenecid  quinolone antibiotics such as ciprofloxacin, levofloxacin, ofloxacin  some herbal dietary supplements  steroid medicines such as prednisone or cortisone  sulfamethoxazole; trimethoprim  thyroid hormones Some medications can hide the warning symptoms of low blood sugar (hypoglycemia). You may need to monitor your blood sugar more closely if you are taking one of these medications. These include:  beta-blockers, often used for high blood pressure or heart problems (examples include atenolol, metoprolol, propranolol)  clonidine  guanethidine  reserpine This list may not describe all possible interactions. Give your health care provider a list of all the medicines, herbs, non-prescription drugs, or dietary supplements you use. Also tell them if you smoke, drink alcohol, or use illegal drugs. Some items may interact with your medicine. What should I watch for while using this medicine? Visit your doctor or health care professional for regular checks on your progress. Drink plenty of fluids while taking this medicine. Check with your doctor or health care professional if you get an attack of severe diarrhea, nausea, and vomiting. The loss of too much body fluid can make it dangerous for you to take this medicine. A test called the HbA1C (A1C) will be monitored. This is a simple blood test. It measures your blood sugar control over the last 2 to 3 months. You will receive this test every 3 to 6 months. Learn how to check your blood  sugar. Learn the symptoms of low and high blood sugar and how to manage them. Always carry a quick-source of sugar with you in case you have symptoms of low blood sugar. Examples include hard sugar candy or glucose tablets. Make sure others know that you can choke if you eat or drink when you develop serious symptoms of low blood sugar, such as seizures or unconsciousness. They must get medical help at once. Tell your doctor or health care professional if you have high blood sugar. You might need to change the dose of your medicine. If you are sick or exercising more than usual, you might need to change the dose of your medicine. Do not skip meals. Ask your doctor or health care professional if you should avoid alcohol. Many nonprescription cough and cold products contain sugar or alcohol. These can affect blood sugar. Pens should never be shared. Even if the needle is changed, sharing may result in passing of viruses like hepatitis or HIV. Wear a medical ID bracelet or chain, and carry a card that describes your disease and details of your medicine and dosage times. Do not become pregnant while taking this medicine. Women should inform their doctor if they wish  to become pregnant or think they might be pregnant. There is a potential for serious side effects to an unborn child. Talk to your health care professional or pharmacist for more information. What side effects may I notice from receiving this medicine? Side effects that you should report to your doctor or health care professional as soon as possible:  allergic reactions like skin rash, itching or hives, swelling of the face, lips, or tongue  breathing problems  changes in vision  diarrhea that continues or is severe  lump or swelling on the neck  severe nausea  signs and symptoms of infection like fever or chills; cough; sore throat; pain or trouble passing urine  signs and symptoms of low blood sugar such as feeling anxious,  confusion, dizziness, increased hunger, unusually weak or tired, sweating, shakiness, cold, irritable, headache, blurred vision, fast heartbeat, loss of consciousness  signs and symptoms of kidney injury like trouble passing urine or change in the amount of urine  trouble swallowing  unusual stomach upset or pain  vomiting Side effects that usually do not require medical attention (report to your doctor or health care professional if they continue or are bothersome):  constipation  diarrhea  nausea  pain, redness, or irritation at site where injected  stomach upset This list may not describe all possible side effects. Call your doctor for medical advice about side effects. You may report side effects to FDA at 1-800-FDA-1088. Where should I keep my medicine? Keep out of the reach of children. Store unopened pens in a refrigerator between 2 and 8 degrees C (36 and 46 degrees F). Do not freeze. Protect from light and heat. After you first use the pen, it can be stored for 56 days at room temperature between 15 and 30 degrees C (59 and 86 degrees F) or in a refrigerator. Throw away your used pen after 56 days or after the expiration date, whichever comes first. Do not store your pen with the needle attached. If the needle is left on, medicine may leak from the pen.

## 2019-09-05 ENCOUNTER — Emergency Department (HOSPITAL_COMMUNITY)
Admission: EM | Admit: 2019-09-05 | Discharge: 2019-09-05 | Disposition: A | Discharge status: Home / Self Care | Payer: Medicare Other | Attending: Emergency Medicine | Admitting: Emergency Medicine

## 2019-09-05 ENCOUNTER — Telehealth: Payer: Self-pay | Admitting: *Deleted

## 2019-09-05 ENCOUNTER — Other Ambulatory Visit: Payer: Self-pay

## 2019-09-05 ENCOUNTER — Emergency Department (HOSPITAL_BASED_OUTPATIENT_CLINIC_OR_DEPARTMENT_OTHER): Payer: Medicare Other

## 2019-09-05 DIAGNOSIS — I129 Hypertensive chronic kidney disease with stage 1 through stage 4 chronic kidney disease, or unspecified chronic kidney disease: Secondary | ICD-10-CM | POA: Diagnosis not present

## 2019-09-05 DIAGNOSIS — Z79899 Other long term (current) drug therapy: Secondary | ICD-10-CM | POA: Insufficient documentation

## 2019-09-05 DIAGNOSIS — Z794 Long term (current) use of insulin: Secondary | ICD-10-CM | POA: Diagnosis not present

## 2019-09-05 DIAGNOSIS — M7989 Other specified soft tissue disorders: Secondary | ICD-10-CM

## 2019-09-05 DIAGNOSIS — G629 Polyneuropathy, unspecified: Secondary | ICD-10-CM | POA: Insufficient documentation

## 2019-09-05 DIAGNOSIS — R609 Edema, unspecified: Secondary | ICD-10-CM | POA: Diagnosis not present

## 2019-09-05 DIAGNOSIS — E1122 Type 2 diabetes mellitus with diabetic chronic kidney disease: Secondary | ICD-10-CM | POA: Insufficient documentation

## 2019-09-05 DIAGNOSIS — N183 Chronic kidney disease, stage 3 unspecified: Secondary | ICD-10-CM | POA: Diagnosis not present

## 2019-09-05 DIAGNOSIS — M79669 Pain in unspecified lower leg: Secondary | ICD-10-CM | POA: Diagnosis present

## 2019-09-05 LAB — CBC
HCT: 40 % (ref 36.0–46.0)
Hemoglobin: 12.5 g/dL (ref 12.0–15.0)
MCH: 30.4 pg (ref 26.0–34.0)
MCHC: 31.3 g/dL (ref 30.0–36.0)
MCV: 97.3 fL (ref 80.0–100.0)
Platelets: 239 10*3/uL (ref 150–400)
RBC: 4.11 MIL/uL (ref 3.87–5.11)
RDW: 13.9 % (ref 11.5–15.5)
WBC: 6 10*3/uL (ref 4.0–10.5)
nRBC: 0 % (ref 0.0–0.2)

## 2019-09-05 LAB — BASIC METABOLIC PANEL
Anion gap: 8 (ref 5–15)
BUN: 26 mg/dL — ABNORMAL HIGH (ref 8–23)
CO2: 27 mmol/L (ref 22–32)
Calcium: 9.3 mg/dL (ref 8.9–10.3)
Chloride: 104 mmol/L (ref 98–111)
Creatinine, Ser: 1.48 mg/dL — ABNORMAL HIGH (ref 0.44–1.00)
GFR calc Af Amer: 41 mL/min — ABNORMAL LOW (ref >60)
GFR calc non Af Amer: 35 mL/min — ABNORMAL LOW (ref >60)
Glucose, Bld: 141 mg/dL — ABNORMAL HIGH (ref 70–99)
Potassium: 4.6 mmol/L (ref 3.5–5.1)
Sodium: 139 mmol/L (ref 135–145)

## 2019-09-05 MED ORDER — GABAPENTIN 100 MG PO CAPS: 100.0000 mg | ORAL_CAPSULE | Freq: Every day | ORAL | 0 refills | Status: DC | PRN

## 2019-09-05 NOTE — ED Provider Notes (Signed)
  Physical Exam  BP 129/73   Pulse 79   Temp 98 F (36.7 C) (Oral)   Resp 14   Ht 5\' 9"  (1.753 m)   Wt 99.8 kg   SpO2 96%   BMI 32.49 kg/m   Physical Exam  ED Course/Procedures     Procedures  MDM  Care assumed at 3 PM.  Patient here with right leg pain. Patient does have a history of diabetic neuropathy.  Patient is already on gabapentin 300 mg twice daily.  Signout pending DVT study.  Patient does have good pulses bilaterally.  5:01 PM DVT study negative.  She is taking gabapentin and I will increase her dose.  She has follow-up with Dr. Erlinda Hong from ortho.        Drenda Freeze, MD 09/05/19 [phone]

## 2019-09-05 NOTE — ED Triage Notes (Signed)
Last night pt began having RLE pain between 2200-2300. This morning leg is cold and swollen. EMS unable to find pulse in R foot. Pulse present on arrival to ED.

## 2019-09-05 NOTE — Telephone Encounter (Signed)
Call from pt - c/o deep burning of right leg, behind her knee; started last night. Denies swelling, warm to touch. Stated unable to see if her leg is red. While on the phone, stating how bad it's burning.  Talked to Dr Dareen Piano, Attending while pt's holding- advised to schedule pt an appt.  Informed pt to schedule an appt to be seen today, stated she does not have a car; she have to call her sister. She stated "it's really burning, I don't know what to do" ;becoming anxious I suggested calling 911/EMS and go to the ER since she does not have transportation now. Stated she did not want to come here and have to go the ER anyway.  She decided she will call 911 instead of making an appt.

## 2019-09-05 NOTE — ED Provider Notes (Signed)
[facility] EMERGENCY DEPARTMENT Provider Note   CSN: [id number] Arrival date & time: 09/05/19  1228     History Leg burning  Toni Hill is a 74 y.o. female.  HPI   Patient presents to the ED for evaluation of leg discomfort.  Patient states her symptoms started last evening.  She had a burning discomfort in her calf and foot.  It also moved up towards her upper leg.  Patient felt like her leg was swollen and also felt cold.  When EMS evaluated her they could not find a pulse.  Patient denies any fevers or chills.  She denies any vascular history.  Patient does have a history of diabetes polyneuropathy.  She also has a history of what sounds like lumbar radiculopathy.  Patient states she has been receiving injections in her back.  Past Medical History:  Diagnosis Date  . Bilateral lower extremity edema    right > left  . Bipolar 1 disorder ([place])   . CKD (chronic kidney disease) stage 3, GFR 30-59 ml/min     Dr Lawson Radar, [facility]  . Diabetes, polyneuropathy ([place])    FEET AND FINGER TIPS  . Fibromyalgia   . GERD (gastroesophageal reflux disease)   . History of chronic gastritis   . Hyperlipidemia   . Hypertension    per pt was take off bp medication because of kidney disease told by her nephrologist  . Hypothyroidism   . Mixed incontinence urge and stress   . OA (osteoarthritis)    KNEES  . OAB (overactive bladder)   . PONV (postoperative nausea and vomiting)   . 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 Type 2 diabetes mellitus treated with insulin ([place])   . Wears glasses     Patient Active Problem List   Diagnosis Date Noted  . Seborrheic keratoses 08/20/2019  . Left breast mass 08/20/2019  . History of left knee replacement 05/29/2019  . Macrocytic anemia 03/08/2019  . UTI (urinary tract infection) 03/04/2019  . Pain of left calf 01/26/2019  . AKI (acute kidney injury) ([place]) 01/25/2019  . Leg swelling 01/25/2019  . QT  prolongation 01/25/2019  . Elevated d-dimer 01/23/2019  . Acute blood loss as cause of postoperative anemia 01/23/2019  . Cellulitis of leg, left 01/23/2019  . At risk for adverse drug event 01/22/2019  . Bipolar 1 disorder ([place])   . 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 Osteoarthritis of left knee 12/30/2018  . Anxiety 12/30/2018  . Stage 3 chronic kidney disease 06/28/2016  . Hypercholesterolemia 08/20/2015  . Type 2 diabetes mellitus ([place]) 02/28/2014  . Esophageal reflux 02/28/2014  . Stress incontinence, female 02/28/2014  . Acquired hypothyroidism 02/28/2014  . Essential hypertension 11/17/2011    Past Surgical History:  Procedure Laterality Date  . CHOLECYSTECTOMY OPEN  1990's  . COLONOSCOPY  last one 07-17-2014  . CYSTOSCOPY WITH INJECTION N/A 10/29/2016   Procedure: CYSTOSCOPY WITH INJECTION BOTOX 100 UNITS, urethral dilation;  Surgeon: Carolan Clines, MD;  Location: [facility];  Service: Urology;  Laterality: N/A;  . ESOPHAGOGASTRODUODENOSCOPY  last one 07-01-2016  . EXCISIONAL BREAST BX  1990   BENIGN  . ORIF TIBIA & FIBULA FRACTURES  2010   retained rod  . TONSILLECTOMY  age 24  . TOTAL KNEE ARTHROPLASTY Left 01/15/2019   Procedure: LEFT TOTAL KNEE ARTHROPLASTY;  Surgeon: Leandrew Koyanagi, MD;  Location: [facility];  Service: Orthopedics;  Laterality: Left;  .  VAGINAL HYSTERECTOMY  1979  . VENTRAL HERNIA REPAIR  56's     OB History    Gravida  2   Para  2   Term      Preterm      AB      Living  2     SAB      TAB      Ectopic      Multiple      Live Births              Family History  Problem Relation Age of Onset  . Breast cancer Mother 64  . Cancer Father        ? type  . Breast cancer Sister 38  . Breast cancer Maternal Grandmother        ? age    Social History   Tobacco Use  . Smoking status: Never Smoker  . Smokeless tobacco: Never Used  Substance Use Topics  . Alcohol use: No  . Drug use:  No    Home Medications Prior to Admission medications   Medication Sig Start Date End Date Taking? Authorizing Provider  amLODipine (NORVASC) 5 MG tablet Take 5 mg by mouth daily. 05/03/19   [provider]  aspirin 81 MG EC tablet TAKE 1 TABLET (81 MG TOTAL) BY MOUTH 2 (TWO) TIMES DAILY. 02/20/19   Leandrew Koyanagi, MD  baclofen (LIORESAL) 10 MG tablet Take 0.5-1 tablets (5-10 mg total) by mouth 3 (three) times daily as needed for muscle spasms. 08/29/19   Hilts, Legrand Como, MD  Calcium Carb-Cholecalciferol (CALCIUM 600/VITAMIN D3) 600-800 MG-UNIT TABS Take 1 tablet by mouth daily.     [provider]  Cholecalciferol (EQL VITAMIN D3) 50 MCG (2000 UT) CAPS Take 2,000 Units by mouth daily.     [provider]  diclofenac Sodium (VOLTAREN) 1 % GEL Apply 2 g topically 4 (four) times daily. 08/27/19   Bartholomew Crews, MD  ezetimibe (ZETIA) 10 MG tablet Take 10 mg by mouth every evening.     [provider]  gabapentin (NEURONTIN) 100 MG capsule Take 1 capsule (100 mg total) by mouth 2 (two) times daily. 08/27/19   Bartholomew Crews, MD  HYDROcodone-acetaminophen (NORCO) 5-325 MG tablet Take 1 tablet by mouth daily as needed for moderate pain. 05/18/19   Aundra Dubin, PA-C  hydrocortisone 2.5 % cream Apply 1 application topically 2 (two) times daily as needed (dry skin).    [provider]  Insulin Glargine (BASAGLAR KWIKPEN) 100 UNIT/ML SOPN Inject 0.2 mLs (20 Units total) into the skin daily. 08/30/19   Maudie Mercury, MD  levothyroxine (SYNTHROID) 125 MCG tablet Take 125 mcg by mouth daily before breakfast.    [provider]  lisinopril (ZESTRIL) 10 MG tablet Take 10 mg by mouth daily. 12/29/18   [provider]  methocarbamol (ROBAXIN) 750 MG tablet TAKE 1 TABLET (750 MG TOTAL) BY MOUTH 2 (TWO) TIMES DAILY AS NEEDED FOR MUSCLE SPASMS. 03/13/19   Aundra Dubin, PA-C  ondansetron (ZOFRAN) 4 MG tablet TAKE 1-2 TABLETS (4-8 MG TOTAL)  BY MOUTH EVERY 8 (EIGHT) HOURS AS NEEDED FOR NAUSEA OR VOMITING. 06/25/19   Aundra Dubin, PA-C  Semaglutide,0.25 or 0.5MG /DOS, 2 MG/1.5ML SOPN Inject 0.25 mg into the skin once a week. 08/30/19   Maudie Mercury, MD  sertraline (ZOLOFT) 100 MG tablet Take 1 tablet (100 mg total) by mouth daily. 06/12/19   Maudie Mercury, MD  simvastatin (ZOCOR) 40  MG tablet Take 40 mg by mouth every evening.     [provider]  Sodium Phosphates (RA SALINE ENEMA) 19-7 GM/118ML ENEM Place 1 each rectally as needed (for constipation).    [provider]  traMADol (ULTRAM) 50 MG tablet Take 1 tablet (50 mg total) by mouth 2 (two) times daily as needed. 05/25/19   Aundra Dubin, PA-C  vitamin B-12 (CYANOCOBALAMIN) 500 MCG tablet Take 1,000 mcg by mouth daily.    [provider]    Allergies    Bactrim [sulfamethoxazole-trimethoprim], Morphine and related, Vioxx [rofecoxib], and Penicillins  Review of Systems   Review of Systems  All other systems reviewed and are negative.   Physical Exam Updated Vital Signs BP 110/63   Pulse 79   Temp 98 F (36.7 C) (Oral)   Resp 14   Ht 1.753 m (5\' 9" )   Wt 99.8 kg   SpO2 96%   BMI 32.49 kg/m   Physical Exam Vitals and nursing note reviewed.  Constitutional:      General: She is not in acute distress.    Appearance: She is well-developed.  HENT:     Head: Normocephalic and atraumatic.     Right Ear: External ear normal.     Left Ear: External ear normal.  Eyes:     General: No scleral icterus.       Right eye: No discharge.        Left eye: No discharge.     Conjunctiva/sclera: Conjunctivae normal.  Neck:     Trachea: No tracheal deviation.  Cardiovascular:     Rate and Rhythm: Normal rate and regular rhythm.     Comments: Strong dorsalis pedal and posterior tibial pulses bilateral feet Pulmonary:     Effort: Pulmonary effort is normal. No respiratory distress.     Breath sounds: Normal breath sounds. No stridor. No  wheezing or rales.  Abdominal:     General: Bowel sounds are normal. There is no distension.     Palpations: Abdomen is soft.     Tenderness: There is no abdominal tenderness. There is no guarding or rebound.  Musculoskeletal:        General: No tenderness or signs of injury. Normal range of motion.     Cervical back: Neck supple.     Comments: Questionable mild asymmetry of the right calf, no definite edema, no erythema  Skin:    General: Skin is warm and dry.     Coloration: Skin is not pale.     Findings: No rash.     Comments: No pallor or cyanosis noted in her feet  Neurological:     Mental Status: She is alert.     Cranial Nerves: No cranial nerve deficit (no facial droop, extraocular movements intact, no slurred speech).     Sensory: No sensory deficit.     Motor: No abnormal muscle tone or seizure activity.     Coordination: Coordination normal.     Comments: 5 out of 5 strength, normal sensation     ED Results / Procedures / Treatments   Labs (all labs ordered are listed, but only abnormal results are displayed) Labs Reviewed  BASIC METABOLIC PANEL - Abnormal; Notable for the following components:      Result Value   Glucose, Bld 141 (*)    BUN 26 (*)    Creatinine, Ser 1.48 (*)    GFR calc non Af Amer 35 (*)    GFR calc Af Wyvonnia Lora  41 (*)    All other components within normal limits  CBC    EKG EKG Interpretation  Date/Time:  Wednesday September 05 2019 12:36:04 EST Ventricular Rate:  82 PR Interval:    QRS Duration: 144 QT Interval:  410 QTC Calculation: 479 R Axis:   108 Text Interpretation: Age not entered, assumed to be  74 years old for purpose of ECG interpretation Sinus rhythm RBBB and LPFB Baseline wander in lead(s) III aVL No significant change since last tracing Confirmed by Dorie Rank [phone]) on 09/05/2019 12:53:08 PM   Radiology No results found.  Procedures Procedures (including critical care time)  Medications Ordered in ED Medications - No  data to display  ED Course  I have reviewed the triage vital signs and the nursing notes.  Pertinent labs & imaging results that were available during my care of the patient were reviewed by me and considered in my medical decision making (see chart for details).    MDM Rules/Calculators/A&P                      Patient presented to the ED with complaints of leg pain.  EMS was concerned about vascular compromise however the patient has strong pulses and normal perfusion on exam.  Medical records indicate the patient does have neuropathy.  Her symptoms are more suggestive of that.  Considering her calf discomfort a Doppler study was ordered.  That is currently pending.  Care will be turned over to Dr. Darl Householder.  If the Doppler study is negative I suspect the patient can go home and follow-up with her doctor regarding her symptoms.  Consider gabapentin or Lyrica for the neuropathy Final Clinical Impression(s) / ED Diagnoses Final diagnoses:  Neuropathy    Rx / DC Orders ED Discharge Orders    None       Dorie Rank, MD 09/05/19 1537

## 2019-09-05 NOTE — Progress Notes (Signed)
Lower extremity venous has been completed.   Preliminary results in CV Proc.   Toni Hill 09/05/2019 4:04 PM

## 2019-09-05 NOTE — Telephone Encounter (Signed)
F/U - pt is currently in the ER per chart.

## 2019-09-05 NOTE — Discharge Instructions (Signed)
You have neuropathy from diabetes. You also may have pinched nerve from your back   Continue your gabapentin. If you have pain, you may take extra dose of 100 mg in the afternoon as needed   See Dr. Erlinda Hong and your primary care doctor   Return to ER if you have worse burning sensation, trouble urinating, trouble walking, toes turning cold

## 2019-09-05 NOTE — Telephone Encounter (Signed)
Thank you Holley Raring! I agree

## 2019-09-06 ENCOUNTER — Encounter: Payer: Self-pay | Admitting: Internal Medicine

## 2019-09-06 ENCOUNTER — Ambulatory Visit (INDEPENDENT_AMBULATORY_CARE_PROVIDER_SITE_OTHER): Payer: Medicare Other | Admitting: Dietician

## 2019-09-06 ENCOUNTER — Other Ambulatory Visit: Payer: Self-pay

## 2019-09-06 ENCOUNTER — Ambulatory Visit (HOSPITAL_COMMUNITY)
Admission: RE | Admit: 2019-09-06 | Discharge: 2019-09-06 | Disposition: A | Discharge status: Home / Self Care | Payer: Medicare Other | Source: Ambulatory Visit | Attending: Internal Medicine | Admitting: Internal Medicine

## 2019-09-06 ENCOUNTER — Ambulatory Visit (INDEPENDENT_AMBULATORY_CARE_PROVIDER_SITE_OTHER): Payer: Medicare Other | Admitting: Internal Medicine

## 2019-09-06 VITALS — BP 131/58 | HR 89 | Temp 98.0°F | Ht 69.0 in | Wt 217.0 lb

## 2019-09-06 DIAGNOSIS — E1140 Type 2 diabetes mellitus with diabetic neuropathy, unspecified: Secondary | ICD-10-CM

## 2019-09-06 DIAGNOSIS — M5440 Lumbago with sciatica, unspecified side: Secondary | ICD-10-CM

## 2019-09-06 DIAGNOSIS — Z794 Long term (current) use of insulin: Secondary | ICD-10-CM

## 2019-09-06 DIAGNOSIS — G629 Polyneuropathy, unspecified: Secondary | ICD-10-CM | POA: Insufficient documentation

## 2019-09-06 DIAGNOSIS — Z713 Dietary counseling and surveillance: Secondary | ICD-10-CM | POA: Diagnosis not present

## 2019-09-06 DIAGNOSIS — G6289 Other specified polyneuropathies: Secondary | ICD-10-CM

## 2019-09-06 DIAGNOSIS — M5136 Other intervertebral disc degeneration, lumbar region: Secondary | ICD-10-CM | POA: Insufficient documentation

## 2019-09-06 MED ORDER — GABAPENTIN 300 MG PO CAPS: 300.0000 mg | ORAL_CAPSULE | Freq: Every day | ORAL | 2 refills | Status: DC | PRN

## 2019-09-06 MED ORDER — HYDROCODONE-ACETAMINOPHEN 5-325 MG PO TABS: 1.0000 | ORAL_TABLET | Freq: Three times a day (TID) | ORAL | 0 refills | Status: AC | PRN

## 2019-09-06 NOTE — Progress Notes (Signed)
Diabetes Self-Management Education  Visit Type: First/Initial  Appt. Start Time: 1330 Appt. End Time: 1400  09/07/2019  [name], identified by name and date of birth, is a 74 y.o. female with a diagnosis of Diabetes: Type 2.   ASSESSMENT   Taking: Ozempic last Friday and tomorrow and basaglar 20 units She reports she has had lows since her medicine change, but not as low as usual.  Lab Results  Component Value Date   HGBA1C 8.3 (A) 08/13/2019   HGBA1C 7.5 (A) 04/03/2019   HGBA1C 7.3 (H) 12/20/2018    She is happy with her weight loss and was counseled today to be sure she includes well balanced meals with sufficient protein, vegetables, fruits and whole grains.   Wt Readings from Last 10 Encounters:  09/06/19 217 lb (98.4 kg)  09/05/19 220 lb (99.8 kg)  08/20/19 219 lb 12.8 oz (99.7 kg)  08/13/19 222 lb 8 oz (100.9 kg)  07/02/19 221 lb (100.2 kg)  04/03/19 221 lb (100.2 kg)  03/08/19 222 lb 4.8 oz (100.8 kg)  03/01/19 220 lb 12.8 oz (100.2 kg)  02/22/19 227 lb (103 kg)  02/21/19 226 lb 6.4 oz (102.7 kg)    Diabetes Self-Management Education - 09/07/19 1400      Visit Information   Visit Type  First/Initial      Initial Visit   Diabetes Type  Type 2    Are you currently following a meal plan?  Yes    What type of meal plan do you follow?  carb controlled    Are you taking your medications as prescribed?  Yes    Date Diagnosed  1990      Health Coping   How would you rate your overall health?  Good      Psychosocial Assessment   Patient Belief/Attitude about Diabetes  Motivated to manage diabetes    Self-care barriers  Lack of transportation;Lack of material resources    Self-management support  Doctor's office;Family;CDE visits    Patient Concerns  Glycemic Control;Weight Control    Special Needs  None    Preferred Learning Style  No preference indicated    Learning Readiness  Ready      Pre-Education Assessment   Patient understands using  medications safely.  Needs Instruction    Patient understands monitoring blood glucose, interpreting and using results  Needs Instruction    Patient understands prevention, detection, and treatment of acute complications.  Needs Review      Complications   How often do you check your blood sugar?  --   more than 5 times a day   Fasting Blood glucose range (mg/dL)  70-129    Postprandial Blood glucose range (mg/dL)  130-179;180-200    Number of hypoglycemic episodes per month  2    Can you tell when your blood sugar is low?  Yes    What do you do if your blood sugar is low?  drinks soemthing sweet    Number of hyperglycemic episodes per week  0    Have you had a dilated eye exam in the past 12 months?  Yes      Patient Education   Medications  Reviewed patients medication for diabetes, action, purpose, timing of dose and side effects.    Monitoring  Identified appropriate SMBG and/or A1C goals.;Other (comment)   reveiwed CGM report with pt and how to interpret     Individualized Goals (developed by patient)   Medications  take  my medication as prescribed    Monitoring   test my blood glucose as discussed      Post-Education Assessment   Patient understands using medications safely.  Demonstrates understanding / competency    Patient understands monitoring blood glucose, interpreting and using results  Demonstrates understanding / competency      Outcomes   Expected Outcomes  Demonstrated interest in learning. Expect positive outcomes    Future DMSE  2 months;Other (comment)    Program Status  Completed       Individualized Plan for Diabetes Self-Management Training:   Learning Objective:  Patient will have a greater understanding of diabetes self-management. Patient education plan is to attend individual and/or group sessions per assessed needs and concerns.   Plan:   There are no Patient Instructions on file for this visit.  Expected Outcomes:  Demonstrated interest in  learning. Expect positive outcomes  Education material provided: Diabetes Resources  If problems or questions, patient to contact team via:  Phone  Future DSME appointment: 2 months, Other (comment)  [name], RD 09/07/2019 3:02 PM.

## 2019-09-06 NOTE — Assessment & Plan Note (Signed)
Hx of diabetic polyneuropathy with loss of sensation of the bilateral lower extremities from the knees to her feet. She will need continued treatment of her DMII and routine foot exams/foot care.  Continue gabapentin for neuropathy.

## 2019-09-06 NOTE — Assessment & Plan Note (Addendum)
Back pain: Unknown period of back pain from multiple falls with the first at least 6 weeks prior. However, the pain has recently grown worse following the second fall this past week when she twisted her back and now has more severe pain in the left SI joint area. There is some radiating into the leg but this is minimal.  sCr 1.48 stable the prior day while at the ER. CC 53 today. NSAIDS are contraindicated due to recent AKI on CKDIV and GI upset.  She has a history of multiple joint surgeries, falls and fractures.  She denied a personal history of cancer, loss of bowel or bladder control or numbness tingling of the perianal area.  Her pain has not responded to gabapentin 100mg  daily. It is minimally improved with hydrocodone 5/325.  Suspect SI joint inflammation vs spinal abnormality and would like to rule out a fracture given her falls and frequent injury history.   Plan: Hydrocodone q8 hours PRN ONE Time 5 day limit for acute worsening pain from a fall DG SI and lumbar spine ordered PT ordered Gabapentin increased to 300 daily, may go up to 900mg  based on renal function  Addendum: DG SI joint unremarkable. DG lumbar spine with 73mm retrolisthesis L3-L4 and L4-L5.  Recommended that she follow-up with Orthopedics regarding her back given the imaging findings and pain as she sees them for her knee.

## 2019-09-06 NOTE — Progress Notes (Signed)
CC: back pain  HPI:[name] is a 74 y.o. female who presents for evaluation of back pain. Please see individual problem based A/P for details.  IMPRESSION: 1. Mild lumbar spine scoliosis. Diffuse multilevel degenerative change with multilevel disc space loss and endplate osteophyte formation again noted. 6 mm retrolisthesis L3 on L4 and L4 on L5. No acute bony abnormality.  2.  Aortoiliac atherosclerotic vascular disease.  MRI Lumbar Spine IMPRESSION: L3-4: 2 mm retrolisthesis. Shallow disc protrusion with a small amount of extruded disc material extending into the proximal foramen on the right. This could possibly affect the right L3 nerve. There is also right lateral recess narrowing that could affect the right L4 nerve.  L4-5: Broad-based disc herniation with a fairly large extruded fragment migrated caudally in the right lateral recess down behind L5 to the proximal foramen. Right L5 nerve compression is quite likely because of this. Additionally, at the disc level, there is stenosis of both subarticular lateral recesses which could affect either L5 nerve in that location. Pronounced facet arthropathy at this level with edematous change worse on the right which could be painful.  L5-S1: Shallow disc protrusion more prominent towards the right. Facet degeneration and hypertrophy worse on the right. Narrowing of the subarticular lateral recess on the right that could affect the right S1 nerve. Facet arthropathy at this level could be painful.  Note that there is a hemangioma within the right side of the L4 vertebral body, which could be relevant should pedicle screws be considered.  Past Medical History:  Diagnosis Date  . Bilateral lower extremity edema    right > left  . Bipolar 1 disorder ([place])   . CKD (chronic kidney disease) stage 3, GFR 30-59 ml/min     Dr Lawson Radar, [facility]  . Diabetes, polyneuropathy ([place])    FEET AND FINGER TIPS   . Fibromyalgia   . GERD (gastroesophageal reflux disease)   . History of chronic gastritis   . Hyperlipidemia   . Hypertension    per pt was take off bp medication because of kidney disease told by her nephrologist  . Hypothyroidism   . Mixed incontinence urge and stress   . OA (osteoarthritis)    KNEES  . OAB (overactive bladder)   . PONV (postoperative nausea and vomiting)   . 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 Type 2 diabetes mellitus treated with insulin ([place])   . Wears glasses    Review of Systems:  ROS negative except as per HPI.  Physical Exam: Vitals:   09/06/19 1357  BP: (!) 131/58  Pulse: 89  Temp: 98 F (36.7 C)  TempSrc: Oral  SpO2: 96%  Weight: 217 lb (98.4 kg)  Height: 5\' 9"  (1.753 m)   Filed Weights   09/06/19 1357  Weight: 217 lb (98.4 kg)   General: A/O x4, in no acute distress, afebrile, nondiaphoretic HEENT: PEERL, EMO intact Cardio: RRR, no mrg's  Pulmonary: CTA bilaterally, no wheezing or crackles  Abdomen: Bowel sounds normal, soft, nontender  MSK: BLE nontender, nonedematous, pain on palpation of the left SI joint, no pain with external or internal rotation of the left or right hips. No pain or laxity of the knees bilaterally Neuro: Alert, CNII-XII grossly intact, conversational, strength 5/5 in the upper and lower extremities bilaterally, normal gait Psych: Appropriate affect, not depressed in appearance, engages well  Assessment & Plan:   See Encounters Tab for problem based charting.  Patient discussed  with Dr. Daryll Drown

## 2019-09-06 NOTE — Patient Instructions (Signed)
FOLLOW-UP INSTRUCTIONS When: As needed if your pain worsens or fails to improve What to bring: All of your medications  For your back pain I have recommended increasing gabapentin to 300 mg daily and have referred you to physical therapy.  Additionally I have ordered x-rays of the lower back and the SI joints to evaluate for any potential fractures or other injuries given her recent fall and weakness.  If any concerns please notify us right away  Thank you for your visit to the [facility] today. If you have any questions or concerns please call us at [phone].

## 2019-09-07 ENCOUNTER — Telehealth: Payer: Self-pay | Admitting: Orthopaedic Surgery

## 2019-09-07 ENCOUNTER — Encounter: Payer: Self-pay | Admitting: Dietician

## 2019-09-07 NOTE — Patient Instructions (Signed)
Hi Raaga,  So good to meet you! You are doing a great job caring for your diabetes!  I made a follow up diabetes appointment with me on the same day, right after you see your doctor in May.   Please call me with any questions !   Butch Penny [phone]

## 2019-09-07 NOTE — Telephone Encounter (Signed)
Pt called in stating she saw Dr. Berline Lopes on 09-06-19 regarding spinal issues she's been having and he told her to ask Dr. Erlinda Hong to take a look at her chart and give his opinion on wether she should make an appt with Dr.Xu  [phone]

## 2019-09-08 NOTE — Telephone Encounter (Signed)
Please let her know that I have reviewed her recent xrays and the visit with Dr. Berline Lopes and I recommend that she see Dr. Ernestina Patches for evaluation and possible ESI.  Thanks.

## 2019-09-10 ENCOUNTER — Telehealth: Payer: Self-pay | Admitting: Dietician

## 2019-09-10 ENCOUNTER — Other Ambulatory Visit: Payer: Self-pay | Admitting: Radiology

## 2019-09-10 DIAGNOSIS — G8929 Other chronic pain: Secondary | ICD-10-CM

## 2019-09-10 DIAGNOSIS — M545 Low back pain, unspecified: Secondary | ICD-10-CM

## 2019-09-10 NOTE — Telephone Encounter (Signed)
Patient called over the weekend saying that she lost her basaglar. She has not had any in 3 days. She describes her blood sugars as "high"- 163 this am, yesterday 167, 187 238/189 and 229 before bed. I reminded her that the GLP-1 she is taking is also helping to control her blood sugars. The plan is for her to call her pharmacy to see if she can get a refill. If she cannot she is going to come here for a sample.

## 2019-09-10 NOTE — Telephone Encounter (Signed)
Please advise.   Called patient. Patient stated she has already previously had two injections by Dr. Ernestina Patches with no relief.

## 2019-09-10 NOTE — Telephone Encounter (Signed)
S/w patient and placed referral

## 2019-09-10 NOTE — Telephone Encounter (Signed)
We should go ahead and send her to Dr. Kathyrn Sheriff.  Thanks.

## 2019-09-13 ENCOUNTER — Other Ambulatory Visit: Payer: Self-pay

## 2019-09-13 ENCOUNTER — Ambulatory Visit: Payer: Medicare Other | Attending: Internal Medicine | Admitting: Physical Therapy

## 2019-09-13 ENCOUNTER — Other Ambulatory Visit: Payer: Self-pay | Admitting: Internal Medicine

## 2019-09-13 ENCOUNTER — Encounter: Payer: Self-pay | Admitting: Physical Therapy

## 2019-09-13 DIAGNOSIS — E1140 Type 2 diabetes mellitus with diabetic neuropathy, unspecified: Secondary | ICD-10-CM

## 2019-09-13 DIAGNOSIS — R296 Repeated falls: Secondary | ICD-10-CM | POA: Insufficient documentation

## 2019-09-13 DIAGNOSIS — M6281 Muscle weakness (generalized): Secondary | ICD-10-CM | POA: Diagnosis present

## 2019-09-13 DIAGNOSIS — M545 Low back pain, unspecified: Secondary | ICD-10-CM

## 2019-09-13 DIAGNOSIS — G8929 Other chronic pain: Secondary | ICD-10-CM | POA: Insufficient documentation

## 2019-09-14 ENCOUNTER — Encounter: Payer: Self-pay | Admitting: Dietician

## 2019-09-14 NOTE — Progress Notes (Signed)
Retinal exam results

## 2019-09-16 NOTE — Therapy (Signed)
[facility] [address] [place], [place], [address] Phone: [phone]   Fax:  [phone]  Physical Therapy Evaluation  Patient Details  Name: Toni Hill MRN: [id number] Date of Birth: [birth year] Referring Provider (PT): Sid Falcon, MD   Encounter Date: 09/13/2019  PT End of Session - 09/16/19 1527    Visit Number  1    Date for PT Re-Evaluation  12/06/19    PT Start Time  1194    PT Stop Time  1612    PT Time Calculation (min)  39 min    Activity Tolerance  Patient tolerated treatment well;Patient limited by fatigue    Behavior During Therapy  [facility] for tasks assessed/performed       Past Medical History:  Diagnosis Date  . Bilateral lower extremity edema    right > left  . Bipolar 1 disorder ([place])   . CKD (chronic kidney disease) stage 3, GFR 30-59 ml/min     Dr Lawson Radar, [facility]  . Diabetes, polyneuropathy ([place])    FEET AND FINGER TIPS  . Fibromyalgia   . GERD (gastroesophageal reflux disease)   . History of chronic gastritis   . Hyperlipidemia   . Hypertension    per pt was take off bp medication because of kidney disease told by her nephrologist  . Hypothyroidism   . Mixed incontinence urge and stress   . OA (osteoarthritis)    KNEES  . OAB (overactive bladder)   . PONV (postoperative nausea and vomiting)   . 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 Type 2 diabetes mellitus treated with insulin ([place])   . Wears glasses     Past Surgical History:  Procedure Laterality Date  . CHOLECYSTECTOMY OPEN  1990's  . COLONOSCOPY  last one 07-17-2014  . CYSTOSCOPY WITH INJECTION N/A 10/29/2016   Procedure: CYSTOSCOPY WITH INJECTION BOTOX 100 UNITS, urethral dilation;  Surgeon: Carolan Clines, MD;  Location: [facility];  Service: Urology;  Laterality: N/A;  . ESOPHAGOGASTRODUODENOSCOPY  last one 07-01-2016  . EXCISIONAL BREAST BX  1990   BENIGN  . ORIF TIBIA  & FIBULA FRACTURES  2010   retained rod  . TONSILLECTOMY  age 23  . TOTAL KNEE ARTHROPLASTY Left 01/15/2019   Procedure: LEFT TOTAL KNEE ARTHROPLASTY;  Surgeon: Leandrew Koyanagi, MD;  Location: [facility];  Service: Orthopedics;  Laterality: Left;  Marland Kitchen VAGINAL HYSTERECTOMY  1979  . VENTRAL HERNIA REPAIR  1990's    There were no vitals filed for this visit.   Subjective Assessment - 09/16/19 1535    Subjective  Pt fell and bruised her butt and realized she hurt her back a few days later. Pt states she fell in February just before the 24th.  Pt states she left her husband Feb 24th and it was sometime before then.  Pt reports she notices her memory has gotten much worse. Pt states she had a very dark bruise on the bottom right in the center. Pt became tearful several times throughout the session when talking about leaving her husband and reports "the last time I left him, my children wouldn't speak to me".  She used no AD during session but states she has one at home and she will have to talk to a lawyer to get it.    Pertinent History  Lt TKA; memory loss; emotionally labile    Limitations  Sitting;Standing;Walking    How long can you  sit comfortably?  unable to get comfortable    How long can you stand comfortably?  unable    How long can you walk comfortably?  unable    Patient Stated Goals  reduce pain    Currently in Pain?  Yes    Pain Score  7     Pain Location  Back    Pain Orientation  Left;Right   Left more than Right   Pain Descriptors / Indicators  Sharp    Pain Type  Acute pain    Pain Radiating Towards  bruised on Rt; pain more on left; down bilateral LE; coccyx pain    Pain Onset  1 to 4 weeks ago    Pain Frequency  Constant    Aggravating Factors   everything hurts    Pain Relieving Factors  Tylenol a little    Effect of Pain on Daily Activities  can't walk or do any normal activities    Multiple Pain Sites  No         [facility] PT Assessment - 09/16/19 0001      Assessment    Medical Diagnosis  M54.40 (ICD-10-CM) - Bilateral low back pain with sciatica, sciatica laterality unspecified, unspecified chronicity    Referring Provider (PT)  Sid Falcon, MD    Prior Therapy  No      Precautions   Precautions  Fall      Balance Screen   Has the patient fallen in the past 6 months  Yes    How many times?  falls a lot    Has the patient had a decrease in activity level because of a fear of falling?   Yes    Is the patient reluctant to leave their home because of a fear of falling?   No      Home Film/video editor residence    Living Arrangements  Other relatives   sister     Prior Function   Level of Independence  Independent      Cognition   Overall Cognitive Status  Within Functional Limits for tasks assessed      Observation/Other Assessments   Focus on Therapeutic Outcomes (FOTO)   68%      Posture/Postural Control   Posture/Postural Control  Postural limitations    Postural Limitations  Flexed trunk;Rounded Shoulders      ROM / Strength   AROM / PROM / Strength  AROM;PROM;Strength      AROM   Overall AROM Comments  lumbar flexion in standing reaching to knees maybe due to balance      Strength   Overall Strength Comments  LE grossly 4-/5 Lt; 4/5 Rt      Flexibility   Soft Tissue Assessment /Muscle Length  yes    Hamstrings  60% limited +pain      Special Tests    Special Tests  Lumbar    Lumbar Tests  Straight Leg Raise      Straight Leg Raise   Findings  Negative    Comment  bilateral      Ambulation/Gait   Gait Pattern  Decreased stance time - left;Scissoring;Trunk flexed   self corrected LOB; was recommended to get RW               Objective measurements completed on examination: See above findings.                PT Short Term Goals - 09/16/19  [place] #1   Title  ind with initial HEP    Time  4    Period  Weeks    Status  New    Target Date  10/11/19       PT SHORT TERM GOAL #2   Title  Completed BERG and appropriate long term goal created    Time  4    Period  Weeks    Status  New    Target Date  10/11/19      PT SHORT TERM GOAL #3   Title  Pt reports 25% reduction in pain    Time  6    Period  Weeks    Status  New    Target Date  10/25/19        PT Long Term Goals - 09/16/19 1613      PT LONG TERM GOAL #1   Title  Pt will be able to ambulate safely with LRAD for at least 15 minutes to be able to go shopping    Baseline  uses cart to ride in    Time  12    Period  Weeks    Status  New    Target Date  12/06/19      PT LONG TERM GOAL #2   Title  Pt will demonstrate at least 48/56 on Berg in order to demonstrate ability to ambulate without AD    Time  12    Period  Weeks    Status  New    Target Date  12/06/19      PT LONG TERM GOAL #3   Title  Pt will demonstrate ability to perform 6 min walk test with good upright posture, no increased pain, and no LOB for return to ambulating more safely and easily in her home    Time  12    Period  Weeks    Status  New    Target Date  12/06/19      PT LONG TERM GOAL #4   Title  Pt will report reduced pain by at least 60%    Baseline  constant at least 7/10    Time  12    Period  Weeks    Status  New    Target Date  12/06/19      PT LONG TERM GOAL #5   Title  Pt will report FOTO < or = to 47%    Time  12    Period  Weeks    Status  New    Target Date  12/06/19             Plan - 09/16/19 1603    Clinical Impression Statement  Pt presents to clinic due to her most recent fall which  she landed on her buttock/tailbone.  Pt is having pain and difficulty with all ADLS reporting 02% limitation on FOTO. Pt  dmonstrates weakness, abnormal posutre, and abnormal gait. She has been having very frequent falls and would benefit from a Berg test to be completed at next visit.  Unable to get through everything today due to needing extra time with history intake.    Personal  Factors and Comorbidities  Comorbidity 3+    Comorbidities  Lt TKA; memory loss; emotionally labile    Examination-Participation Restrictions  Community Activity    Stability/Clinical Decision Making  Evolving/Moderate complexity    Clinical Decision Making  Low  Rehab Potential  Good    PT Frequency  2x / week    PT Duration  12 weeks    PT Treatment/Interventions  ADLs/Self Care Home Management;Aquatic Therapy;Biofeedback;Cryotherapy;Electrical Stimulation;Moist Heat;Traction;Gait training;Functional mobility training;Therapeutic activities;Therapeutic exercise;Neuromuscular re-education;Manual techniques;Patient/family education;Passive range of motion;Dry needling;Taping    PT Next Visit Plan  Berg; f/u on coccyx pillow and RW; STM and estim to lumbar    Consulted and Agree with Plan of Care  Patient       Patient will benefit from skilled therapeutic intervention in order to improve the following deficits and impairments:  Abnormal gait, Decreased range of motion, Pain, Impaired flexibility, Postural dysfunction, Decreased strength, Decreased balance  Visit Diagnosis: Chronic low back pain, unspecified back pain laterality, unspecified whether sciatica present  Repeated falls  Muscle weakness (generalized)     Problem List Patient Active Problem List   Diagnosis Date Noted  . Bilateral low back pain with sciatica 09/06/2019  . Peripheral neuropathy 09/06/2019  . Seborrheic keratoses 08/20/2019  . Left breast mass 08/20/2019  . History of left knee replacement 05/29/2019  . Macrocytic anemia 03/08/2019  . UTI (urinary tract infection) 03/04/2019  . Pain of left calf 01/26/2019  . AKI (acute kidney injury) ([place]) 01/25/2019  . Leg swelling 01/25/2019  . QT prolongation 01/25/2019  . Elevated d-dimer 01/23/2019  . Acute blood loss as cause of postoperative anemia 01/23/2019  . Cellulitis of leg, left 01/23/2019  . At risk for adverse drug event 01/22/2019  . Bipolar 1  disorder ([place])   . 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 Osteoarthritis of left knee 12/30/2018  . Anxiety 12/30/2018  . Stage 3 chronic kidney disease 06/28/2016  . Hypercholesterolemia 08/20/2015  . Type 2 diabetes mellitus ([place]) 02/28/2014  . Esophageal reflux 02/28/2014  . Stress incontinence, female 02/28/2014  . Acquired hypothyroidism 02/28/2014  . Essential hypertension 11/17/2011    Jule Ser, PT 09/16/2019, 4:23 PM  [facility] [address], [place], [place], [address] Phone: [phone]   Fax:  [phone]  Name: Toni Hill MRN: [id number] Date of Birth: [birth year]

## 2019-09-17 ENCOUNTER — Ambulatory Visit: Payer: Medicare Other | Admitting: Physical Therapy

## 2019-09-17 ENCOUNTER — Other Ambulatory Visit: Payer: Self-pay

## 2019-09-17 DIAGNOSIS — G8929 Other chronic pain: Secondary | ICD-10-CM

## 2019-09-17 DIAGNOSIS — M6281 Muscle weakness (generalized): Secondary | ICD-10-CM

## 2019-09-17 DIAGNOSIS — R296 Repeated falls: Secondary | ICD-10-CM

## 2019-09-17 DIAGNOSIS — M545 Low back pain, unspecified: Secondary | ICD-10-CM

## 2019-09-17 NOTE — Therapy (Signed)
[facility] [address] [place], [place], [address] Phone: [phone]   Fax:  [phone]  Physical Therapy Treatment  Patient Details  Name: Toni Hill MRN: [id number] Date of Birth: [birth year] Referring Provider (PT): Sid Falcon, MD   Encounter Date: 09/17/2019  PT End of Session - 09/17/19 1404    Visit Number  2    Date for PT Re-Evaluation  12/06/19    PT Start Time  1400    PT Stop Time  1440    PT Time Calculation (min)  40 min    Activity Tolerance  Patient tolerated treatment well;Patient limited by fatigue    Behavior During Therapy  [facility] for tasks assessed/performed       Past Medical History:  Diagnosis Date  . Bilateral lower extremity edema    right > left  . Bipolar 1 disorder ([place])   . CKD (chronic kidney disease) stage 3, GFR 30-59 ml/min     Dr Lawson Radar, [facility]  . Diabetes, polyneuropathy ([place])    FEET AND FINGER TIPS  . Fibromyalgia   . GERD (gastroesophageal reflux disease)   . History of chronic gastritis   . Hyperlipidemia   . Hypertension    per pt was take off bp medication because of kidney disease told by her nephrologist  . Hypothyroidism   . Mixed incontinence urge and stress   . OA (osteoarthritis)    KNEES  . OAB (overactive bladder)   . PONV (postoperative nausea and vomiting)   . 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 Type 2 diabetes mellitus treated with insulin ([place])   . Wears glasses     Past Surgical History:  Procedure Laterality Date  . CHOLECYSTECTOMY OPEN  1990's  . COLONOSCOPY  last one 07-17-2014  . CYSTOSCOPY WITH INJECTION N/A 10/29/2016   Procedure: CYSTOSCOPY WITH INJECTION BOTOX 100 UNITS, urethral dilation;  Surgeon: Carolan Clines, MD;  Location: [place];  Service: Urology;  Laterality: N/A;  . ESOPHAGOGASTRODUODENOSCOPY  last one 07-01-2016  . EXCISIONAL BREAST BX  1990   BENIGN  . ORIF TIBIA &  FIBULA FRACTURES  2010   retained rod  . TONSILLECTOMY  age 81  . TOTAL KNEE ARTHROPLASTY Left 01/15/2019   Procedure: LEFT TOTAL KNEE ARTHROPLASTY;  Surgeon: Leandrew Koyanagi, MD;  Location: [place];  Service: Orthopedics;  Laterality: Left;  Marland Kitchen VAGINAL HYSTERECTOMY  1979  . VENTRAL HERNIA REPAIR  1990's    There were no vitals filed for this visit.  Subjective Assessment - 09/17/19 1425    Subjective  Pt states my back hurts and my neck hurts.    Currently in Pain?  Yes    Pain Score  7     Pain Location  Back    Pain Orientation  Left    Pain Descriptors / Indicators  Aching    Pain Type  Acute pain    Pain Onset  1 to 4 weeks ago    Pain Frequency  Constant    Multiple Pain Sites  No         [facility] PT Assessment - 09/17/19 0001      Standardized Balance Assessment   Standardized Balance Assessment  Berg Balance Test      Berg Balance Test   Sit to Stand  Able to stand  independently using hands    Standing Unsupported  Able to stand 2 minutes with supervision  Sitting with Back Unsupported but Feet Supported on Floor or Stool  Able to sit safely and securely 2 minutes    Stand to Sit  Sits safely with minimal use of hands    Transfers  Able to transfer safely, minor use of hands    Standing Unsupported with Eyes Closed  Able to stand 10 seconds with supervision    Standing Unsupported with Feet Together  Needs help to attain position but able to stand for 30 seconds with feet together    From Standing, Reach Forward with Outstretched Arm  Can reach forward >12 cm safely (5")    From Standing Position, Pick up Object from Floor  Unable to pick up and needs supervision    From Standing Position, Turn to Look Behind Over each Shoulder  Looks behind one side only/other side shows less weight shift    Turn 360 Degrees  Needs close supervision or verbal cueing    Standing Unsupported, Alternately Place Feet on Step/Stool  Able to complete 4 steps without aid or supervision     Standing Unsupported, One Foot in Front  Able to take small step independently and hold 30 seconds    Standing on One Leg  Tries to lift leg/unable to hold 3 seconds but remains standing independently    Total Score  35    Berg comment:  unsafe without AD; needed rest breaks between most of the activities                   [facility] Adult PT Treatment/Exercise - 09/17/19 0001      Exercises   Exercises  Knee/Hip      Knee/Hip Exercises: Aerobic   Nustep  L4 x 6 min      Knee/Hip Exercises: Seated   Long Arc Quad  Strengthening;Right;Left;15 reps    Clamshell with TheraBand  Red   20x   Marching  Both;20 reps    Sit to [place]  with UE support;1 set;5 reps             PT Education - 09/17/19 1438    Education Details  Access Code: [email or web address]    Person(s) Educated  Patient    Methods  Explanation;Demonstration;Handout;Verbal cues    Comprehension  Verbalized understanding;Returned demonstration       PT Short Term Goals - 09/16/19 1612      PT SHORT TERM GOAL #1   Title  ind with initial HEP    Time  4    Period  Weeks    Status  New    Target Date  10/11/19      PT SHORT TERM GOAL #2   Title  Completed BERG and appropriate long term goal created    Time  4    Period  Weeks    Status  New    Target Date  10/11/19      PT SHORT TERM GOAL #3   Title  Pt reports 25% reduction in pain    Time  6    Period  Weeks    Status  New    Target Date  10/25/19        PT Long Term Goals - 09/16/19 1613      PT LONG TERM GOAL #1   Title  Pt will be able to ambulate safely with LRAD for at least 15 minutes to be able to go shopping    Baseline  uses cart to ride in  Time  12    Period  Weeks    Status  New    Target Date  12/06/19      PT LONG TERM GOAL #2   Title  Pt will demonstrate at least 48/56 on Berg in order to demonstrate ability to ambulate without AD    Time  12    Period  Weeks    Status  New    Target Date  12/06/19      PT LONG TERM  GOAL #3   Title  Pt will demonstrate ability to perform 6 min walk test with good upright posture, no increased pain, and no LOB for return to ambulating more safely and easily in her home    Time  12    Period  Weeks    Status  New    Target Date  12/06/19      PT LONG TERM GOAL #4   Title  Pt will report reduced pain by at least 60%    Baseline  constant at least 7/10    Time  12    Period  Weeks    Status  New    Target Date  12/06/19      PT LONG TERM GOAL #5   Title  Pt will report FOTO < or = to 47%    Time  12    Period  Weeks    Status  New    Target Date  12/06/19            Plan - 09/17/19 1426    Clinical Impression Statement  Pt complains of being out of breath when completing the BERG.  She was very wobbly and needed supervision throughout as well as seated rest breaks.  She did well on the nustep for endurance.  Pt was given initial HEP.  She will discuss possibly doing aquatic therapy if she can schedule it.    PT Treatment/Interventions  ADLs/Self Care Home Management;Aquatic Therapy;Biofeedback;Cryotherapy;Electrical Stimulation;Moist Heat;Traction;Gait training;Functional mobility training;Therapeutic activities;Therapeutic exercise;Neuromuscular re-education;Manual techniques;Patient/family education;Passive range of motion;Dry needling;Taping    PT Next Visit Plan  start with nustep L4 for endurance; progress LE strength; balance    PT Home Exercise Plan  she will bring RW next time; Access Code: [email or web address]    Consulted and Agree with Plan of Care  Patient       Patient will benefit from skilled therapeutic intervention in order to improve the following deficits and impairments:  Abnormal gait, Decreased range of motion, Pain, Impaired flexibility, Postural dysfunction, Decreased strength, Decreased balance  Visit Diagnosis: Chronic low back pain, unspecified back pain laterality, unspecified whether sciatica present  Repeated falls  Muscle weakness  (generalized)     Problem List Patient Active Problem List   Diagnosis Date Noted  . Bilateral low back pain with sciatica 09/06/2019  . Peripheral neuropathy 09/06/2019  . Seborrheic keratoses 08/20/2019  . Left breast mass 08/20/2019  . History of left knee replacement 05/29/2019  . Macrocytic anemia 03/08/2019  . UTI (urinary tract infection) 03/04/2019  . Pain of left calf 01/26/2019  . AKI (acute kidney injury) ([place]) 01/25/2019  . Leg swelling 01/25/2019  . QT prolongation 01/25/2019  . Elevated d-dimer 01/23/2019  . Acute blood loss as cause of postoperative anemia 01/23/2019  . Cellulitis of leg, left 01/23/2019  . At risk for adverse drug event 01/22/2019  . Bipolar 1 disorder ([place])   . 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 Osteoarthritis of  left knee 12/30/2018  . Anxiety 12/30/2018  . Stage 3 chronic kidney disease 06/28/2016  . Hypercholesterolemia 08/20/2015  . Type 2 diabetes mellitus ([place]) 02/28/2014  . Esophageal reflux 02/28/2014  . Stress incontinence, female 02/28/2014  . Acquired hypothyroidism 02/28/2014  . Essential hypertension 11/17/2011    Jule Ser, PT 09/17/2019, 2:46 PM  [facility] [address] [place], [place], [address] Phone: [phone]   Fax:  [phone]  Name: Toni Hill MRN: [id number] Date of Birth: 26-Jun-1946

## 2019-09-17 NOTE — Patient Instructions (Signed)
Access Code: [email or web address] URL: [email or web address] Date: 09/17/2019 Prepared by: Jari Favre  Exercises Sit to Stand without Arm Support - 3 x daily - 7 x weekly - 1 sets - 10 reps Seated Long Arc Quad - 1 x daily - 7 x weekly - 10 reps - 3 sets Seated March - 1 x daily - 7 x weekly - 10 reps - 3 sets Seated Hip Abduction with Resistance - 1 x daily - 7 x weekly - 10 reps - 3 sets

## 2019-09-18 NOTE — Progress Notes (Signed)
Internal Medicine Clinic Attending  Case discussed with Dr. Harbrecht at the time of the visit.  We reviewed the resident's history and exam and pertinent patient test results.  I agree with the assessment, diagnosis, and plan of care documented in the resident's note.   

## 2019-09-27 ENCOUNTER — Other Ambulatory Visit: Payer: Self-pay

## 2019-09-27 ENCOUNTER — Other Ambulatory Visit: Payer: Self-pay | Admitting: Dietician

## 2019-09-27 ENCOUNTER — Ambulatory Visit: Payer: Medicare Other | Admitting: Physical Therapy

## 2019-09-27 ENCOUNTER — Encounter: Payer: Self-pay | Admitting: Physical Therapy

## 2019-09-27 DIAGNOSIS — M6281 Muscle weakness (generalized): Secondary | ICD-10-CM

## 2019-09-27 DIAGNOSIS — M545 Low back pain, unspecified: Secondary | ICD-10-CM

## 2019-09-27 DIAGNOSIS — R296 Repeated falls: Secondary | ICD-10-CM

## 2019-09-27 DIAGNOSIS — G8929 Other chronic pain: Secondary | ICD-10-CM

## 2019-09-27 NOTE — Telephone Encounter (Signed)
Per patient it is time for reorder of her CGM sensors and needs [place] to fax in clinical notes. Mail order Geralyn Flash is [employer] [phone]. Will fax  She reports her sugars are high and her wight is decreasing, blood sugars fasting are 177 today, 154 yesterday fasting/ 132, 158  . Other blood sugars later in the day are: 211/225/206/226//246/ 273   Suggest increasing baslagar to 22 units, then 2 units every week until most fasting blood sugars < 130. Consult Dr. Gilford Rile for approval.

## 2019-09-27 NOTE — Patient Instructions (Signed)
Access Code: [email or web address] URL: [email or web address]: 03/25/2021Prepared by: Venetia Night BeuhringExercises  Sit to Stand without Arm Support - 3 x daily - 7 x weekly - 1 sets - 10 reps  Seated Long Arc Quad - 1 x daily - 7 x weekly - 10 reps - 3 sets  Seated March - 1 x daily - 7 x weekly - 10 reps - 3 sets  Seated Hip Abduction with Resistance - 1 x daily - 7 x weekly - 10 reps - 3 sets  Seated Heel Toe Raises - 1 x daily - 7 x weekly - 2 sets - 20 reps  Side to Side Weight Shift with Counter Support - 1 x daily - 7 x weekly - 10 reps - 3 sets  Staggered Stance Forward Backward Weight Shift with Unilateral Counter Support - 1 x daily - 7 x weekly - 10 reps - 3 sets

## 2019-09-27 NOTE — Therapy (Addendum)
[facility] [address], [place], [place], [address] Phone: [phone]   Fax:  [phone]  Physical Therapy Treatment  Patient Details  Name: Toni Hill MRN: [id number] Date of Birth: 1945/11/16 Referring Provider (PT): Sid Falcon, MD   Encounter Date: 09/27/2019  PT End of Session - 09/27/19 1240    Visit Number  3    Date for PT Re-Evaluation  12/06/19    PT Start Time  1230    PT Stop Time  1315    PT Time Calculation (min)  45 min    Activity Tolerance  Patient tolerated treatment well;Patient limited by fatigue    Behavior During Therapy  [facility] for tasks assessed/performed       Past Medical History:  Diagnosis Date  . Bilateral lower extremity edema    right > left  . Bipolar 1 disorder ([place])   . CKD (chronic kidney disease) stage 3, GFR 30-59 ml/min     Dr Lawson Radar, [facility]  . Diabetes, polyneuropathy ([place])    FEET AND FINGER TIPS  . Fibromyalgia   . GERD (gastroesophageal reflux disease)   . History of chronic gastritis   . Hyperlipidemia   . Hypertension    per pt was take off bp medication because of kidney disease told by her nephrologist  . Hypothyroidism   . Mixed incontinence urge and stress   . OA (osteoarthritis)    KNEES  . OAB (overactive bladder)   . PONV (postoperative nausea and vomiting)   . 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 Type 2 diabetes mellitus treated with insulin ([place])   . Wears glasses     Past Surgical History:  Procedure Laterality Date  . CHOLECYSTECTOMY OPEN  1990's  . COLONOSCOPY  last one 07-17-2014  . CYSTOSCOPY WITH INJECTION N/A 10/29/2016   Procedure: CYSTOSCOPY WITH INJECTION BOTOX 100 UNITS, urethral dilation;  Surgeon: Carolan Clines, MD;  Location: [place];  Service: Urology;  Laterality: N/A;  . ESOPHAGOGASTRODUODENOSCOPY  last one 07-01-2016  . EXCISIONAL BREAST BX  1990   BENIGN  . ORIF TIBIA &  FIBULA FRACTURES  2010   retained rod  . TONSILLECTOMY  age 61  . TOTAL KNEE ARTHROPLASTY Left 01/15/2019   Procedure: LEFT TOTAL KNEE ARTHROPLASTY;  Surgeon: Leandrew Koyanagi, MD;  Location: [place];  Service: Orthopedics;  Laterality: Left;  Marland Kitchen VAGINAL HYSTERECTOMY  1979  . VENTRAL HERNIA REPAIR  1990's    There were no vitals filed for this visit.  Subjective Assessment - 09/27/19 1237    Subjective  Pt states she has fallen two more times since last here - both times in her bathroom.  My sister and her husband had to help me get up.    Pertinent History  Lt TKA; memory loss; emotionally labile    Limitations  Sitting;Standing;Walking    How long can you sit comfortably?  unable to get comfortable    How long can you stand comfortably?  unable    How long can you walk comfortably?  unable    Patient Stated Goals  reduce pain    Currently in Pain?  Yes    Pain Score  8     Pain Location  Back    Pain Orientation  Left    Pain Descriptors / Indicators  Aching    Pain Type  Acute pain    Pain Onset  1 to  4 weeks ago    Pain Frequency  Constant    Aggravating Factors   everything hurts    Effect of Pain on Daily Activities  all activities limited especially walking                       [facility] Adult PT Treatment/Exercise - 09/27/19 0001      Exercises   Exercises  Knee/Hip;Lumbar;Ankle      Lumbar Exercises: Aerobic   Nustep  L4 x 10', PT present to review recent fall history      Lumbar Exercises: Seated   Other Seated Lumbar Exercises  marching alt LEs x 40, PT cued TrA and tall sitting      Knee/Hip Exercises: Seated   Long Arc Quad  Strengthening;AROM;Both;1 set;15 reps    Ball Squeeze  10x5 sec, PT cued TrA and tall sitting    Clamshell with TheraBand  Green   20 reps   Sit to [employer]  with UE support;2 sets;5 reps   PT cued stand long enough to find upright posture + balance     Ankle Exercises: Seated   Heel Raises  20 reps    Heel Raises Limitations   combo heel/toe raise    Toe Raise  20 reps          Balance Exercises - 09/27/19 1252      Balance Exercises: Standing   Other Standing Exercises  weight shifts laterally and in split stance bil x 20 sec each        PT Education - 09/27/19 1315    Education Details  Access Code: [email or web address]    Person(s) Educated  Patient    Methods  Explanation;Demonstration;Verbal cues;Handout    Comprehension  Verbalized understanding;Returned demonstration       PT Short Term Goals - 09/27/19 1243      PT SHORT TERM GOAL #1   Title  ind with initial HEP    Status  On-going      PT SHORT TERM GOAL #2   Title  Completed BERG and appropriate long term goal created    Status  Achieved        PT Long Term Goals - 09/16/19 1613      PT LONG TERM GOAL #1   Title  Pt will be able to ambulate safely with LRAD for at least 15 minutes to be able to go shopping    Baseline  uses cart to ride in    Time  12    Period  Weeks    Status  New    Target Date  12/06/19      PT LONG TERM GOAL #2   Title  Pt will demonstrate at least 48/56 on Berg in order to demonstrate ability to ambulate without AD    Time  12    Period  Weeks    Status  New    Target Date  12/06/19      PT LONG TERM GOAL #3   Title  Pt will demonstrate ability to perform 6 min walk test with good upright posture, no increased pain, and no LOB for return to ambulating more safely and easily in her home    Time  12    Period  Weeks    Status  New    Target Date  12/06/19      PT LONG TERM GOAL #4   Title  Pt will report reduced pain by at  least 60%    Baseline  constant at least 7/10    Time  12    Period  Weeks    Status  New    Target Date  12/06/19      PT LONG TERM GOAL #5   Title  Pt will report FOTO < or = to 47%    Time  12    Period  Weeks    Status  New    Target Date  12/06/19            Plan - 09/27/19 1317    Clinical Impression Statement  Pt reported falling twice in bathroom since last  appointment.  Pt arrived with 2-wheeled walker which allowed for much steadier gait.  She was able to perform 10 min on NuStep covering nearly .5 miles at L4.  PT introduced standing multi-directional weight shifts with UE support at counter (added to HEP) today.  Pt was somewhat limited by sore bruised Rt ankle with this.  PT increased seated resitance clam from red to green and added second set of sit to stand with cue to use hip hinge.  PT cued tall sitting and TrA with all seated ther ex today.  Added seated heel/toe raises to HEP and gave green band for clam.  PT had Pt sit on two towels under ischial tubs to off-weight coccyx which improved pain in sitting.  PT discussed the need to sprinkle in standing ther ex as tol to work on balance and dynamic strength as tol.  Pt requested seated ther ex after weight shifts today due to pain.  She will continue to benefit from skilled PT to address pain, balance and strength.    Comorbidities  Lt TKA; memory loss; emotionally labile    PT Frequency  2x / week    PT Duration  12 weeks    PT Treatment/Interventions  ADLs/Self Care Home Management;Aquatic Therapy;Biofeedback;Cryotherapy;Electrical Stimulation;Moist Heat;Traction;Gait training;Functional mobility training;Therapeutic activities;Therapeutic exercise;Neuromuscular re-education;Manual techniques;Patient/family education;Passive range of motion;Dry needling;Taping    PT Next Visit Plan  f/u on weight shifts added to HEP, nustep L4 for endurance; progress LE strength; balance, intro more standing ther ex at counter as tol    PT Home Exercise Plan  Access Code: [email or web address]    Consulted and Agree with Plan of Care  Patient       Patient will benefit from skilled therapeutic intervention in order to improve the following deficits and impairments:     Visit Diagnosis: Chronic low back pain, unspecified back pain laterality, unspecified whether sciatica present  Repeated falls  Muscle weakness  (generalized)     Problem List Patient Active Problem List   Diagnosis Date Noted  . Bilateral low back pain with sciatica 09/06/2019  . Peripheral neuropathy 09/06/2019  . Seborrheic keratoses 08/20/2019  . Left breast mass 08/20/2019  . History of left knee replacement 05/29/2019  . Macrocytic anemia 03/08/2019  . UTI (urinary tract infection) 03/04/2019  . Pain of left calf 01/26/2019  . AKI (acute kidney injury) ([place]) 01/25/2019  . Leg swelling 01/25/2019  . QT prolongation 01/25/2019  . Elevated d-dimer 01/23/2019  . Acute blood loss as cause of postoperative anemia 01/23/2019  . Cellulitis of leg, left 01/23/2019  . At risk for adverse drug event 01/22/2019  . Bipolar 1 disorder ([place])   . 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 Osteoarthritis of left knee 12/30/2018  . Anxiety 12/30/2018  . Stage 3 chronic kidney disease 06/28/2016  .  Hypercholesterolemia 08/20/2015  . Type 2 diabetes mellitus ([place]) 02/28/2014  . Esophageal reflux 02/28/2014  . Stress incontinence, female 02/28/2014  . Acquired hypothyroidism 02/28/2014  . Essential hypertension 11/17/2011    Reatha Sur, PT 09/27/19 1:25 PM  PHYSICAL THERAPY DISCHARGE SUMMARY  Visits from Start of Care: 3  Current functional level related to goals / functional outcomes: See above, Pt moving so needs to be d/c'd from PT.   Remaining deficits: See above   Education / Equipment: HEP Plan: Patient agrees to discharge.  Patient goals were not met. Patient is being discharged due to the patient's request.  ?????         Baruch Merl, PT 10/08/19 12:20 PM   [facility] [address] [place], [place], [address] Phone: [phone]   Fax:  [phone]  Name: Toni Hill MRN: [id number] Date of Birth: 1946/01/09

## 2019-09-28 NOTE — Progress Notes (Signed)
[name] - 74 y.o. female MRN [id number]  Date of birth: [birth year]  Office Visit Note: Visit Date: 05/24/2019 PCP: Maudie Mercury, MD Referred by: Maudie Mercury, MD  Subjective: Chief Complaint  Patient presents with  . Lower Back - Pain  . Right Leg - Pain   HPI: Toni Hill is a 74 y.o. female who comes in today For planned right L5-S1 interlaminar epidural steroid injection for chronic severe worsening right L5 radicular pain complaints with MRI imaging of pretty significant disc herniation at L4-5 on the right.  ROS Otherwise per HPI.  Assessment & Plan: Visit Diagnoses:  1. Lumbar radiculopathy     Plan: No additional findings.   Meds & Orders:  Meds ordered this encounter  Medications  . methylPREDNISolone acetate (DEPO-MEDROL) injection 80 mg    Orders Placed This Encounter  Procedures  . XR C-ARM NO REPORT  . Epidural Steroid injection    Follow-up: Return if symptoms worsen or fail to improve.   Procedures: No procedures performed  Lumbar Epidural Steroid Injection - Interlaminar Approach with Fluoroscopic Guidance  Patient: Toni Hill      Date of Birth: 12-30-1945 MRN: [id number] PCP: Maudie Mercury, MD      Visit Date: 05/24/2019   Universal Protocol:     Consent Given By: the patient  Position: PRONE  Additional Comments: Vital signs were monitored before and after the procedure. Patient was prepped and draped in the usual sterile fashion. The correct patient, procedure, and site was verified.   Injection Procedure Details:  Procedure Site One Meds Administered:  Meds ordered this encounter  Medications  . methylPREDNISolone acetate (DEPO-MEDROL) injection 80 mg     Laterality: Right  Location/Site:  L5-S1  Needle size: 20 G  Needle type: Tuohy  Needle Placement: Paramedian epidural  Findings:   -Comments: Excellent flow of contrast into the epidural space.  Procedure Details: Using a paramedian approach  from the side mentioned above, the region overlying the inferior lamina was localized under fluoroscopic visualization and the soft tissues overlying this structure were infiltrated with 4 ml. of 1% Lidocaine without Epinephrine. The Tuohy needle was inserted into the epidural space using a paramedian approach.   The epidural space was localized using loss of resistance along with lateral and bi-planar fluoroscopic views.  After negative aspirate for air, blood, and CSF, a 2 ml. volume of Isovue-250 was injected into the epidural space and the flow of contrast was observed. Radiographs were obtained for documentation purposes.    The injectate was administered into the level noted above.   Additional Comments:  The patient tolerated the procedure well Dressing: 2 x 2 sterile gauze and Band-Aid    Post-procedure details: Patient was observed during the procedure. Post-procedure instructions were reviewed.  Patient left the clinic in stable condition.    Clinical History: MRI LUMBAR SPINE WITHOUT CONTRAST  TECHNIQUE: Multiplanar, multisequence MR imaging of the lumbar spine was performed. No intravenous contrast was administered.  COMPARISON:  Radiography 09/20/2018  FINDINGS: Segmentation:  5 lumbar type vertebral bodies.  Alignment:  2 mm retrolisthesis L3-4.  Vertebrae: Benign appearing hemangioma within the right side of the L4 vertebral body. No other focal bone finding.  Conus medullaris and cauda equina: Conus extends to the L1 level. Conus and cauda equina appear normal.  Paraspinal and other soft tissues: Negative  Disc levels:  T12-L1: Normal.  L1-2: Minimal annular bulging with annular fissure.  No stenosis.  L2-3: Minimal disc bulge. Annular  fissure in the left extraforaminal region. No stenosis.  L3-4: 2 mm retrolisthesis. Disc degeneration with shallow disc protrusion. Small amount of extruded disc material on the right extending towards the  foramen. Some potential this could affect the exiting right L3 nerve. Right lateral recess narrowing could affect the right L4 nerve.  L4-5: Broad-based disc herniation with caudal migration of disc material to the right of midline behind L5. Facet degeneration and hypertrophy with edematous change particularly on the right. L4 nerve root appears to exit the foramina without compression. Bilateral lateral recess stenosis could affect either L5 nerve. Likely compression of the right L5 nerve by the extruded caudally migrated disc fragment.  L5-S1: Shallow disc protrusion more prominent towards the right. Bilateral facet degeneration and hypertrophy.  IMPRESSION: L3-4: 2 mm retrolisthesis. Shallow disc protrusion with a small amount of extruded disc material extending into the proximal foramen on the right. This could possibly affect the right L3 nerve. There is also right lateral recess narrowing that could affect the right L4 nerve.  L4-5: Broad-based disc herniation with a fairly large extruded fragment migrated caudally in the right lateral recess down behind L5 to the proximal foramen. Right L5 nerve compression is quite likely because of this. Additionally, at the disc level, there is stenosis of both subarticular lateral recesses which could affect either L5 nerve in that location. Pronounced facet arthropathy at this level with edematous change worse on the right which could be painful.  L5-S1: Shallow disc protrusion more prominent towards the right. Facet degeneration and hypertrophy worse on the right. Narrowing of the subarticular lateral recess on the right that could affect the right S1 nerve. Facet arthropathy at this level could be painful.  Note that there is a hemangioma within the right side of the L4 vertebral body, which could be relevant should pedicle screws be considered.   Electronically Signed   By: Nelson Chimes M.D.   On: 10/20/2018 14:23    She reports that she has never smoked. She has never used smokeless tobacco.  Recent Labs    12/20/18 1511 04/03/19 1554 08/13/19 1604  HGBA1C 7.3* 7.5* 8.3*    Objective:  VS:  HT:    WT:   BMI:     BP:(!) 149/76  HR:85bpm  TEMP: ( )  RESP:  Physical Exam  Ortho Exam Imaging: No results found.  Past Medical/Family/Surgical/Social History: Medications & Allergies reviewed per EMR, new medications updated. Patient Active Problem List   Diagnosis Date Noted  . Bilateral low back pain with sciatica 09/06/2019  . Peripheral neuropathy 09/06/2019  . Seborrheic keratoses 08/20/2019  . Left breast mass 08/20/2019  . History of left knee replacement 05/29/2019  . Macrocytic anemia 03/08/2019  . UTI (urinary tract infection) 03/04/2019  . Pain of left calf 01/26/2019  . AKI (acute kidney injury) ([place]) 01/25/2019  . Leg swelling 01/25/2019  . QT prolongation 01/25/2019  . Elevated d-dimer 01/23/2019  . Acute blood loss as cause of postoperative anemia 01/23/2019  . Cellulitis of leg, left 01/23/2019  . At risk for adverse drug event 01/22/2019  . Bipolar 1 disorder ([place])   . 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 Osteoarthritis of left knee 12/30/2018  . Anxiety 12/30/2018  . Stage 3 chronic kidney disease 06/28/2016  . Hypercholesterolemia 08/20/2015  . Type 2 diabetes mellitus ([place]) 02/28/2014  . Esophageal reflux 02/28/2014  . Stress incontinence, female 02/28/2014  . Acquired hypothyroidism 02/28/2014  . Essential hypertension 11/17/2011  Past Medical History:  Diagnosis Date  . Bilateral lower extremity edema    right > left  . Bipolar 1 disorder ([place])   . CKD (chronic kidney disease) stage 3, GFR 30-59 ml/min     Dr Lawson Radar, [facility]  . Diabetes, polyneuropathy ([place])    FEET AND FINGER TIPS  . Fibromyalgia   . GERD (gastroesophageal reflux disease)   . History of chronic gastritis   . Hyperlipidemia   . Hypertension    per  pt was take off bp medication because of kidney disease told by her nephrologist  . Hypothyroidism   . Mixed incontinence urge and stress   . OA (osteoarthritis)    KNEES  . OAB (overactive bladder)   . PONV (postoperative nausea and vomiting)   . 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 Type 2 diabetes mellitus treated with insulin ([place])   . Wears glasses    Family History  Problem Relation Age of Onset  . Breast cancer Mother 68  . Cancer Father        ? type  . Breast cancer Sister 1  . Breast cancer Maternal Grandmother        ? age   Past Surgical History:  Procedure Laterality Date  . CHOLECYSTECTOMY OPEN  1990's  . COLONOSCOPY  last one 07-17-2014  . CYSTOSCOPY WITH INJECTION N/A 10/29/2016   Procedure: CYSTOSCOPY WITH INJECTION BOTOX 100 UNITS, urethral dilation;  Surgeon: Carolan Clines, MD;  Location: [facility];  Service: Urology;  Laterality: N/A;  . ESOPHAGOGASTRODUODENOSCOPY  last one 07-01-2016  . EXCISIONAL BREAST BX  1990   BENIGN  . ORIF TIBIA & FIBULA FRACTURES  2010   retained rod  . TONSILLECTOMY  age 47  . TOTAL KNEE ARTHROPLASTY Left 01/15/2019   Procedure: LEFT TOTAL KNEE ARTHROPLASTY;  Surgeon: Leandrew Koyanagi, MD;  Location: [facility];  Service: Orthopedics;  Laterality: Left;  Marland Kitchen VAGINAL HYSTERECTOMY  1979  . VENTRAL HERNIA REPAIR  1990's   Social History   Occupational History  . Not on file  Tobacco Use  . Smoking status: Never Smoker  . Smokeless tobacco: Never Used  Substance and Sexual Activity  . Alcohol use: No  . Drug use: No  . Sexual activity: Not Currently

## 2019-09-28 NOTE — Procedures (Signed)
Lumbar Epidural Steroid Injection - Interlaminar Approach with Fluoroscopic Guidance  Patient: Toni Hill      Date of Birth: [birth year] MRN: [id number] PCP: Maudie Mercury, MD      Visit Date: 05/24/2019   Universal Protocol:     Consent Given By: the patient  Position: PRONE  Additional Comments: Vital signs were monitored before and after the procedure. Patient was prepped and draped in the usual sterile fashion. The correct patient, procedure, and site was verified.   Injection Procedure Details:  Procedure Site One Meds Administered:  Meds ordered this encounter  Medications  . methylPREDNISolone acetate (DEPO-MEDROL) injection 80 mg     Laterality: Right  Location/Site:  L5-S1  Needle size: 20 G  Needle type: Tuohy  Needle Placement: Paramedian epidural  Findings:   -Comments: Excellent flow of contrast into the epidural space.  Procedure Details: Using a paramedian approach from the side mentioned above, the region overlying the inferior lamina was localized under fluoroscopic visualization and the soft tissues overlying this structure were infiltrated with 4 ml. of 1% Lidocaine without Epinephrine. The Tuohy needle was inserted into the epidural space using a paramedian approach.   The epidural space was localized using loss of resistance along with lateral and bi-planar fluoroscopic views.  After negative aspirate for air, blood, and CSF, a 2 ml. volume of Isovue-250 was injected into the epidural space and the flow of contrast was observed. Radiographs were obtained for documentation purposes.    The injectate was administered into the level noted above.   Additional Comments:  The patient tolerated the procedure well Dressing: 2 x 2 sterile gauze and Band-Aid    Post-procedure details: Patient was observed during the procedure. Post-procedure instructions were reviewed.  Patient left the clinic in stable condition.

## 2019-10-02 NOTE — Telephone Encounter (Signed)
I agree with increasing baslagar to 22U , then 2U every week until most fasting blood sugars are <130. Thank you very much for helping with Mrs. Toni Hill care.  Sincerely,  Toni Mercury, MD

## 2019-10-02 NOTE — Telephone Encounter (Signed)
Thank you. I will call  Her to obtain blood sugars this week and advise per your order.  Confirmed [facility] received notes.

## 2019-10-03 ENCOUNTER — Telehealth: Payer: Self-pay | Admitting: Internal Medicine

## 2019-10-03 NOTE — Telephone Encounter (Signed)
Pls contact the pt regarding referral [phone]

## 2019-10-03 NOTE — Telephone Encounter (Signed)
Left voicemail for return call  

## 2019-10-03 NOTE — Telephone Encounter (Signed)
Called and spoke with the patient. Referrals have been Re-routed.

## 2019-10-04 ENCOUNTER — Other Ambulatory Visit: Payer: Self-pay

## 2019-10-04 ENCOUNTER — Ambulatory Visit: Payer: Self-pay

## 2019-10-04 ENCOUNTER — Encounter: Payer: Self-pay | Admitting: Physician Assistant

## 2019-10-04 ENCOUNTER — Ambulatory Visit (INDEPENDENT_AMBULATORY_CARE_PROVIDER_SITE_OTHER): Payer: Medicare Other | Admitting: Orthopaedic Surgery

## 2019-10-04 ENCOUNTER — Encounter: Payer: Medicare Other | Admitting: Physical Therapy

## 2019-10-04 DIAGNOSIS — M25571 Pain in right ankle and joints of right foot: Secondary | ICD-10-CM | POA: Diagnosis not present

## 2019-10-04 MED ORDER — HYDROCODONE-ACETAMINOPHEN 5-325 MG PO TABS: 1.0000 | ORAL_TABLET | Freq: Every day | ORAL | 0 refills | Status: DC

## 2019-10-04 MED ORDER — DICLOFENAC SODIUM 1 % EX GEL: 2.0000 g | Freq: Four times a day (QID) | CUTANEOUS | 0 refills | Status: DC

## 2019-10-04 NOTE — Progress Notes (Signed)
Office Visit Note   Patient: Toni Hill           Date of Birth: August 09, 1945           MRN: [id number] Visit Date: 10/04/2019              Requested by: Maudie Mercury, MD [address] [place],  [place] [address] PCP: Maudie Mercury, MD   Assessment & Plan: Visit Diagnoses:  1. Pain in right ankle and joints of right foot     Plan: Impression is right ankle contusion.  We will place the patient in a cam walker weightbearing as tolerated to try and help settle this down.  I have also recommended relative rest, ice and elevation.  I will call in Voltaren gel to use as needed.  I will also send in 1 prescription for Norco as she does not benefit from tramadol or Tylenol 3.  Follow-up with [place] in 2 weeks time for recheck.  Follow-Up Instructions: Return in about 2 weeks (around 10/18/2019).   Orders:  Orders Placed This Encounter  Procedures  . XR Ankle Complete Right   Meds ordered this encounter  Medications  . HYDROcodone-acetaminophen (NORCO) 5-325 MG tablet    Sig: Take 1-2 tablets by mouth daily.    Dispense:  20 tablet    Refill:  0  . diclofenac Sodium (VOLTAREN) 1 % GEL    Sig: Apply 2 g topically 4 (four) times daily.    Dispense:  150 g    Refill:  0      Procedures: No procedures performed   Clinical Data: No additional findings.   Subjective: Chief Complaint  Patient presents with  . Right Ankle - Pain    HPI patient is a pleasant 74 year old female who comes in today with a new injury to her right ankle.  Approximately 2 weeks ago she was at [facility] when a buggy fell on top of her medial ankle causing her to fall backwards onto her buttocks.  She noticed a few days later that she had increasing pain and swelling as well as ecchymosis to the right ankle which progressively worsened.  The pain she has is primarily to the anteromedial aspect and is worse with bearing weight for a long period of time.  She has recently been working in PT  utilizing a recumbent bike which may be aggravating her symptoms.  Review of Systems as detailed in HPI.  All others reviewed and are negative.   Objective: Vital Signs: There were no vitals taken for this visit.  Physical Exam well-developed well-nourished female no acute distress.  Alert and oriented x3.  Ortho Exam examination of her right ankle reveals mild tenderness to the medial malleolus.  No tenderness along the lateral malleolus or the peroneal or posterior tibial tendons.  She has fairly good range of motion about the ankle without pain.  Mild swelling throughout.  Ecchymosis to the medial lateral aspect.  She is neurovascular intact distally.  Specialty Comments:  No specialty comments available.  Imaging: XR Ankle Complete Right  Result Date: 10/04/2019 X-rays demonstrate old tibia fracture without new fracture.  No hardware complication.  She does have moderate ossification to the syndesmosis    PMFS History: Patient Active Problem List   Diagnosis Date Noted  . Bilateral low back pain with sciatica 09/06/2019  . Peripheral neuropathy 09/06/2019  . Seborrheic keratoses 08/20/2019  . Left breast mass 08/20/2019  . History of left knee replacement  05/29/2019  . Macrocytic anemia 03/08/2019  . UTI (urinary tract infection) 03/04/2019  . Pain of left calf 01/26/2019  . AKI (acute kidney injury) ([place]) 01/25/2019  . Leg swelling 01/25/2019  . QT prolongation 01/25/2019  . Elevated d-dimer 01/23/2019  . Acute blood loss as cause of postoperative anemia 01/23/2019  . Cellulitis of leg, left 01/23/2019  . At risk for adverse drug event 01/22/2019  . Bipolar 1 disorder ([place])   . 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 Osteoarthritis of left knee 12/30/2018  . Anxiety 12/30/2018  . Stage 3 chronic kidney disease 06/28/2016  . Hypercholesterolemia 08/20/2015  . Type 2 diabetes mellitus ([place]) 02/28/2014  . Esophageal reflux 02/28/2014  . Stress  incontinence, female 02/28/2014  . Acquired hypothyroidism 02/28/2014  . Essential hypertension 11/17/2011   Past Medical History:  Diagnosis Date  . Bilateral lower extremity edema    right > left  . Bipolar 1 disorder ([place])   . CKD (chronic kidney disease) stage 3, GFR 30-59 ml/min     Dr Lawson Radar, [facility]  . Diabetes, polyneuropathy ([place])    FEET AND FINGER TIPS  . Fibromyalgia   . GERD (gastroesophageal reflux disease)   . History of chronic gastritis   . Hyperlipidemia   . Hypertension    per pt was take off bp medication because of kidney disease told by her nephrologist  . Hypothyroidism   . Mixed incontinence urge and stress   . OA (osteoarthritis)    KNEES  . OAB (overactive bladder)   . PONV (postoperative nausea and vomiting)   . 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 Type 2 diabetes mellitus treated with insulin ([place])   . Wears glasses     Family History  Problem Relation Age of Onset  . Breast cancer Mother 54  . Cancer Father        ? type  . Breast cancer Sister 60  . Breast cancer Maternal Grandmother        ? age    Past Surgical History:  Procedure Laterality Date  . CHOLECYSTECTOMY OPEN  1990's  . COLONOSCOPY  last one 07-17-2014  . CYSTOSCOPY WITH INJECTION N/A 10/29/2016   Procedure: CYSTOSCOPY WITH INJECTION BOTOX 100 UNITS, urethral dilation;  Surgeon: Carolan Clines, MD;  Location: [facility];  Service: Urology;  Laterality: N/A;  . ESOPHAGOGASTRODUODENOSCOPY  last one 07-01-2016  . EXCISIONAL BREAST BX  1990   BENIGN  . ORIF TIBIA & FIBULA FRACTURES  2010   retained rod  . TONSILLECTOMY  age 74  . TOTAL KNEE ARTHROPLASTY Left 01/15/2019   Procedure: LEFT TOTAL KNEE ARTHROPLASTY;  Surgeon: Leandrew Koyanagi, MD;  Location: [facility];  Service: Orthopedics;  Laterality: Left;  Marland Kitchen VAGINAL HYSTERECTOMY  1979  . VENTRAL HERNIA REPAIR  1990's   Social History   Occupational History  . Not on file  Tobacco Use    . Smoking status: Never Smoker  . Smokeless tobacco: Never Used  Substance and Sexual Activity  . Alcohol use: No  . Drug use: No  . Sexual activity: Not Currently

## 2019-10-09 ENCOUNTER — Encounter: Payer: Medicare Other | Admitting: Physical Therapy

## 2019-10-11 ENCOUNTER — Telehealth: Payer: Self-pay | Admitting: Dietician

## 2019-10-11 ENCOUNTER — Encounter: Payer: Medicare Other | Admitting: Physical Therapy

## 2019-10-11 DIAGNOSIS — Z794 Long term (current) use of insulin: Secondary | ICD-10-CM

## 2019-10-11 DIAGNOSIS — E1140 Type 2 diabetes mellitus with diabetic neuropathy, unspecified: Secondary | ICD-10-CM

## 2019-10-11 NOTE — Telephone Encounter (Signed)
Trouble with cgm sensors, advised her to not use adhesive in/ center of applicator where needle is inserted and how to use her phone app as a reader. She is taking 22 units basalgar each day and 0.25 ozempic weekly. She reports good weight loss and "high" blood sugars: her fasting sugars reported are:  124/150/163/155/167/146/150/133/185/104/118/177/155/177/206.  Consult Dr. Gilford Rile about a medicine change

## 2019-10-15 ENCOUNTER — Encounter: Payer: Medicare Other | Admitting: Physical Therapy

## 2019-10-15 ENCOUNTER — Telehealth: Payer: Self-pay | Admitting: Dietician

## 2019-10-15 NOTE — Telephone Encounter (Signed)
Ms. Jerome requested a list of her current medications texted to her. She agreed to have a written medication list mailed to her to keep in her wallet/purse.

## 2019-10-16 ENCOUNTER — Encounter: Payer: Medicare Other | Admitting: Physical Therapy

## 2019-10-18 ENCOUNTER — Encounter: Payer: Medicare Other | Admitting: Physical Therapy

## 2019-10-18 ENCOUNTER — Other Ambulatory Visit: Payer: Self-pay | Admitting: Family Medicine

## 2019-10-18 NOTE — Addendum Note (Signed)
Addended by: Resa Miner on: 10/18/2019 02:45 PM   Modules accepted: Orders

## 2019-10-18 NOTE — Telephone Encounter (Signed)
Discussed Toni Hill blood sugars and diabetes medicine with Dr. Gilford Rile. He agrees to have her increase her dose of Ozempic and decrease her insulin to 12 units and continue to monitor her blood sugars. Toni Hill verbalized understanding that Friday she'd increase her Ozempic to 0.5mg  weekly and decrease her basalgar. She requested a refill  prescription for the Ozempic sent to [facility] in [place]

## 2019-10-19 ENCOUNTER — Ambulatory Visit (INDEPENDENT_AMBULATORY_CARE_PROVIDER_SITE_OTHER): Payer: Medicare Other

## 2019-10-19 ENCOUNTER — Ambulatory Visit (INDEPENDENT_AMBULATORY_CARE_PROVIDER_SITE_OTHER): Payer: Medicare Other | Admitting: Orthopaedic Surgery

## 2019-10-19 ENCOUNTER — Other Ambulatory Visit: Payer: Self-pay

## 2019-10-19 ENCOUNTER — Encounter: Payer: Self-pay | Admitting: Orthopaedic Surgery

## 2019-10-19 DIAGNOSIS — Z96652 Presence of left artificial knee joint: Secondary | ICD-10-CM | POA: Diagnosis not present

## 2019-10-19 MED ORDER — OZEMPIC (0.25 OR 0.5 MG/DOSE) 2 MG/1.5ML ~~LOC~~ SOPN: 0.5000 mg | PEN_INJECTOR | SUBCUTANEOUS | 1 refills | Status: DC

## 2019-10-19 MED ORDER — BASAGLAR KWIKPEN 100 UNIT/ML ~~LOC~~ SOPN: 12.0000 [IU] | PEN_INJECTOR | Freq: Every day | SUBCUTANEOUS | 0 refills | Status: DC

## 2019-10-19 MED ORDER — HYDROCODONE-ACETAMINOPHEN 5-325 MG PO TABS: 1.0000 | ORAL_TABLET | Freq: Every day | ORAL | 0 refills | Status: DC | PRN

## 2019-10-19 NOTE — Addendum Note (Signed)
Addended by: Maudie Mercury C on: 10/19/2019 03:50 PM   Modules accepted: Orders

## 2019-10-19 NOTE — Progress Notes (Signed)
Office Visit Note   Patient: Toni Hill           Date of Birth: [birth year]           MRN: [id number] Visit Date: 10/19/2019              Requested by: Maudie Mercury, MD [address] [place],  [place] [address] PCP: Maudie Mercury, MD   Assessment & Plan: Visit Diagnoses:  1. History of total knee replacement, left     Plan: Impression is resolving right ankle contusion and sprain.  #2 status post left total knee replacement.  In regards to her right lower extremity, we will place her in a shorter boot that is not as cumbersome.  She will continue to ice and elevate as needed for pain and swelling.  She will follow up with [place] in 4 weeks time for recheck.  In regards to her left knee, she will continue with range of motion and strengthening exercises.  She will follow-up with [place] in July when she is 1 year out from surgery.  Dental prophylaxis reinforced.  Call with concerns or questions in the meantime.  Follow-Up Instructions: Return in about 4 weeks (around 11/16/2019).   Orders:  Orders Placed This Encounter  Procedures  . XR KNEE 3 VIEW LEFT   Meds ordered this encounter  Medications  . HYDROcodone-acetaminophen (NORCO) 5-325 MG tablet    Sig: Take 1 tablet by mouth daily as needed for moderate pain.    Dispense:  10 tablet    Refill:  0      Procedures: No procedures performed   Clinical Data: No additional findings.   Subjective: Chief Complaint  Patient presents with  . Right Ankle - Follow-up  . Left Knee - Pain    HPI patient is a pleasant 74 year old female who comes in today for follow-up of her right ankle injury as well as left total knee replacement.  In regards to her right ankle, she did sustain a contusion a few weeks ago when a cart at [facility] fell on top of the medial ankle.  She was seen in our office around that time where x-rays were obtained.  These were negative for any acute findings.  She was placed in a cam walker  weightbearing as tolerated.  She does note that she feels somewhat better but still has some pain to the anterior ankle.  This is worse with bearing weight.  In regards to the left knee, she is status post left total knee replacement 01/15/2019.  She has been having some pain and bruising recently.  No specific injury.  Review of Systems as detailed in HPI.  All others reviewed and are negative.   Objective: Vital Signs: There were no vitals taken for this visit.  Physical Exam well-developed well-nourished female in no acute distress.  Alert and oriented x3.  Ortho Exam examination of her right ankle reveals mild tenderness over the ATFL.  She has full range of motion otherwise.  No swelling.  Her medial tibia contusion as mostly resolved.  She has minimal tenderness there.  Left knee exam shows a fully healed surgical scar without complication.  Range of motion is from 10 degrees of flexion contracture to about 115 degrees of flexion.  She is stable valgus varus stress.  Specialty Comments:  No specialty comments available.  Imaging: XR KNEE 3 VIEW LEFT  Result Date: 10/19/2019 X-rays demonstrate a well-seated prosthesis without complication  PMFS History: Patient Active Problem List   Diagnosis Date Noted  . Bilateral low back pain with sciatica 09/06/2019  . Peripheral neuropathy 09/06/2019  . Seborrheic keratoses 08/20/2019  . Left breast mass 08/20/2019  . History of left knee replacement 05/29/2019  . Macrocytic anemia 03/08/2019  . UTI (urinary tract infection) 03/04/2019  . Pain of left calf 01/26/2019  . AKI (acute kidney injury) ([place]) 01/25/2019  . Leg swelling 01/25/2019  . QT prolongation 01/25/2019  . Elevated d-dimer 01/23/2019  . Acute blood loss as cause of postoperative anemia 01/23/2019  . Cellulitis of leg, left 01/23/2019  . At risk for adverse drug event 01/22/2019  . Bipolar 1 disorder ([place])   . 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  Osteoarthritis of left knee 12/30/2018  . Anxiety 12/30/2018  . Stage 3 chronic kidney disease 06/28/2016  . Hypercholesterolemia 08/20/2015  . Type 2 diabetes mellitus ([place]) 02/28/2014  . Esophageal reflux 02/28/2014  . Stress incontinence, female 02/28/2014  . Acquired hypothyroidism 02/28/2014  . Essential hypertension 11/17/2011   Past Medical History:  Diagnosis Date  . Bilateral lower extremity edema    right > left  . Bipolar 1 disorder ([place])   . CKD (chronic kidney disease) stage 3, GFR 30-59 ml/min     Dr Lawson Radar, [facility]  . Diabetes, polyneuropathy ([place])    FEET AND FINGER TIPS  . Fibromyalgia   . GERD (gastroesophageal reflux disease)   . History of chronic gastritis   . Hyperlipidemia   . Hypertension    per pt was take off bp medication because of kidney disease told by her nephrologist  . Hypothyroidism   . Mixed incontinence urge and stress   . OA (osteoarthritis)    KNEES  . OAB (overactive bladder)   . PONV (postoperative nausea and vomiting)   . 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 Type 2 diabetes mellitus treated with insulin ([place])   . Wears glasses     Family History  Problem Relation Age of Onset  . Breast cancer Mother 67  . Cancer Father        ? type  . Breast cancer Sister 72  . Breast cancer Maternal Grandmother        ? age    Past Surgical History:  Procedure Laterality Date  . CHOLECYSTECTOMY OPEN  1990's  . COLONOSCOPY  last one 07-17-2014  . CYSTOSCOPY WITH INJECTION N/A 10/29/2016   Procedure: CYSTOSCOPY WITH INJECTION BOTOX 100 UNITS, urethral dilation;  Surgeon: Carolan Clines, MD;  Location: [facility];  Service: Urology;  Laterality: N/A;  . ESOPHAGOGASTRODUODENOSCOPY  last one 07-01-2016  . EXCISIONAL BREAST BX  1990   BENIGN  . ORIF TIBIA & FIBULA FRACTURES  2010   retained rod  . TONSILLECTOMY  age 47  . TOTAL KNEE ARTHROPLASTY Left 01/15/2019   Procedure: LEFT TOTAL KNEE  ARTHROPLASTY;  Surgeon: Leandrew Koyanagi, MD;  Location: [facility];  Service: Orthopedics;  Laterality: Left;  Marland Kitchen VAGINAL HYSTERECTOMY  1979  . VENTRAL HERNIA REPAIR  1990's   Social History   Occupational History  . Not on file  Tobacco Use  . Smoking status: Never Smoker  . Smokeless tobacco: Never Used  Substance and Sexual Activity  . Alcohol use: No  . Drug use: No  . Sexual activity: Not Currently

## 2019-10-22 ENCOUNTER — Encounter: Payer: Medicare Other | Admitting: Physical Therapy

## 2019-10-23 ENCOUNTER — Encounter: Payer: Medicare Other | Admitting: Physical Therapy

## 2019-10-24 ENCOUNTER — Telehealth: Payer: Self-pay | Admitting: Internal Medicine

## 2019-10-24 NOTE — Telephone Encounter (Signed)
RTC, pt states she has been getting some low, fasting blood sugars in the morning which range from 53 to 67 and tweak upwards to 217.  Pt states sometimes she is only eating OJ and crackers for breakfast, states she takes her kwikpen in the morning when she awakens without eating much.  Pt denies any s/s of hypoglycemia, although she does c/o of h/a's when her BS is low.  Appt made in [facility] for tomorrow at 3:15. Reinforced need to eat a balanced diet when taking DM meds, but RN also made patient an appt with Butch Penny at 3:45 tomorrow after MD appt for diet, education reinforcement. SChaplin, RN,BSN

## 2019-10-24 NOTE — Telephone Encounter (Signed)
Pls contact regarding blood sugar [phone]

## 2019-10-25 ENCOUNTER — Ambulatory Visit: Payer: Medicare Other | Admitting: Dietician

## 2019-10-25 ENCOUNTER — Ambulatory Visit (INDEPENDENT_AMBULATORY_CARE_PROVIDER_SITE_OTHER): Payer: Medicare Other | Admitting: Internal Medicine

## 2019-10-25 ENCOUNTER — Encounter: Payer: Medicare Other | Admitting: Physical Therapy

## 2019-10-25 ENCOUNTER — Encounter: Payer: Self-pay | Admitting: Dietician

## 2019-10-25 ENCOUNTER — Encounter: Payer: Self-pay | Admitting: Internal Medicine

## 2019-10-25 ENCOUNTER — Other Ambulatory Visit: Payer: Self-pay

## 2019-10-25 DIAGNOSIS — E1140 Type 2 diabetes mellitus with diabetic neuropathy, unspecified: Secondary | ICD-10-CM

## 2019-10-25 DIAGNOSIS — Z794 Long term (current) use of insulin: Secondary | ICD-10-CM

## 2019-10-25 MED ORDER — LEVOTHYROXINE SODIUM 125 MCG PO TABS: 125.0000 ug | ORAL_TABLET | Freq: Every day | ORAL | 3 refills | Status: DC

## 2019-10-25 MED ORDER — SIMVASTATIN 40 MG PO TABS: 40.0000 mg | ORAL_TABLET | Freq: Every evening | ORAL | 3 refills | Status: DC

## 2019-10-25 NOTE — Progress Notes (Signed)
   CC: Hypoglycemia  HPI:  [name] is a 74 y.o. female, with a PMH noted below, who presents to the [facility] for a follow up on her diabetes management with hypoglycemic episodes. To see the management of the acuate and chronic conditions, please see the attached A&P note.   Past Medical History:  Diagnosis Date  . Bilateral lower extremity edema    right > left  . Bipolar 1 disorder ([place])   . CKD (chronic kidney disease) stage 3, GFR 30-59 ml/min     Dr Lawson Radar, [facility]  . Diabetes, polyneuropathy ([place])    FEET AND FINGER TIPS  . Fibromyalgia   . GERD (gastroesophageal reflux disease)   . History of chronic gastritis   . Hyperlipidemia   . Hypertension    per pt was take off bp medication because of kidney disease told by her nephrologist  . Hypothyroidism   . Mixed incontinence urge and stress   . OA (osteoarthritis)    KNEES  . OAB (overactive bladder)   . PONV (postoperative nausea and vomiting)   . 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 Type 2 diabetes mellitus treated with insulin ([place])   . Wears glasses    Review of Systems:  Review of Systems  Constitutional: Negative for chills, fever, malaise/fatigue and weight loss.  Respiratory: Negative for shortness of breath.   Cardiovascular: Negative for chest pain, palpitations, orthopnea and claudication.  Gastrointestinal: Negative for abdominal pain, constipation, diarrhea, nausea and vomiting.  Neurological: Negative for dizziness, tingling, tremors and headaches.    Physical Exam:  There were no vitals filed for this visit. Physical Exam Vitals and nursing note reviewed.  Constitutional:      General: She is not in acute distress.    Appearance: Normal appearance. She is not ill-appearing or toxic-appearing.  HENT:     Head: Normocephalic and atraumatic.  Eyes:     General:        Right eye: No discharge.        Left eye: No discharge.     Conjunctiva/sclera: Conjunctivae normal.   Cardiovascular:     Rate and Rhythm: Normal rate and regular rhythm.     Pulses: Normal pulses.     Heart sounds: Normal heart sounds. No murmur. No friction rub. No gallop.   Pulmonary:     Effort: Pulmonary effort is normal.     Breath sounds: Normal breath sounds. No wheezing, rhonchi or rales.  Abdominal:     General: Bowel sounds are normal.     Palpations: Abdomen is soft.     Tenderness: There is no abdominal tenderness. There is no guarding.  Musculoskeletal:        General: No swelling.     Right lower leg: No edema.     Left lower leg: No edema.  Neurological:     General: No focal deficit present.     Mental Status: She is alert and oriented to person, place, and time.  Psychiatric:        Mood and Affect: Mood normal.        Behavior: Behavior normal.     Assessment & Plan:   See Encounters Tab for problem based charting.  Patient discussed with Dr. Dareen Piano

## 2019-10-25 NOTE — Progress Notes (Signed)
Co-visit with Dr. Gilford Rile today. Assisted with CGM download and review. Assisted patient to place personal CGM sensor and start it. [name], RD 10/25/2019 6:26 PM.

## 2019-10-25 NOTE — Patient Instructions (Addendum)
To Prabhjot,  It was a pleasure seeing you again today! Today we discussed your diabetes management. Today we will discontinue your Kwikpen, and increase your ozempic to 0.5 units. We will reassess how your sugars are trending at your next visit on 5/17/221 at 1:15 pm. Have a good day and I will see you next month!  Have a good day,  Maudie Mercury, MD

## 2019-10-25 NOTE — Assessment & Plan Note (Signed)
Patient presents today with readings on her phone of 53-mid 60s in the morning. Her insulin and ozempic were adjusted on the 10/19/19 to 12U from 22U of Basaglar and 0.5U of ozempic. Patient changed Basaglar to 12U, but continued .25 ozempic. Sugars have ranged from 53-206, with low readings in the AM. Patient states that she would switch her ozempic today. We discussed diet and sugar levels at this visit. Will transition off [place] today and increase ozempic to 0.5.   Patient is doing well on ozempic 0.25. Down 17 lbs since her last visit to 200 lb today. Tolerating medications well, with no complaints of hypoglycemia. Will transition off basaglar given low CBG levels.   Plan: - Continue CBG monitoring - D/C Basaglar - Increase ozempic to 0.5

## 2019-10-26 ENCOUNTER — Ambulatory Visit: Payer: Medicare Other | Admitting: Physical Therapy

## 2019-10-29 ENCOUNTER — Telehealth: Payer: Self-pay | Admitting: Dietician

## 2019-10-29 ENCOUNTER — Encounter: Payer: Medicare Other | Admitting: Physical Therapy

## 2019-10-29 NOTE — Telephone Encounter (Signed)
Pls sch telehealth appt this PM [facility] or PCP

## 2019-10-29 NOTE — Telephone Encounter (Signed)
Toni Hill calls saying she is concerned about her high blood sugars: She states that on Friday her readings were 126/130/171/278 Saturday  199/235/310/232/229 Sunday 218/219/258/258 Dizzy and has a headache, started Friday took 0.5 mg ozempic, last basaglar was Thursday.  Appetite not good, has not been eating much. I told her that I would speak with a doctor and call her back.

## 2019-10-30 ENCOUNTER — Encounter: Payer: Medicare Other | Admitting: Physical Therapy

## 2019-10-30 NOTE — Telephone Encounter (Signed)
Returned patient call. Informed her she needs to schedule a telehealth visit to discuss her blood sugars with a doctor. [place] office basked to call her to schedule.

## 2019-10-31 ENCOUNTER — Other Ambulatory Visit: Payer: Self-pay

## 2019-10-31 ENCOUNTER — Encounter: Payer: Self-pay | Admitting: Internal Medicine

## 2019-10-31 ENCOUNTER — Encounter: Payer: Self-pay | Admitting: Orthopaedic Surgery

## 2019-10-31 ENCOUNTER — Ambulatory Visit (INDEPENDENT_AMBULATORY_CARE_PROVIDER_SITE_OTHER): Payer: Medicare Other | Admitting: Internal Medicine

## 2019-10-31 DIAGNOSIS — E1140 Type 2 diabetes mellitus with diabetic neuropathy, unspecified: Secondary | ICD-10-CM

## 2019-10-31 DIAGNOSIS — Z794 Long term (current) use of insulin: Secondary | ICD-10-CM

## 2019-10-31 NOTE — Telephone Encounter (Signed)
Spoke with the patient and she has been added for today @ 1:45 10/31/2019 pm for a telehealth visit.

## 2019-10-31 NOTE — Assessment & Plan Note (Signed)
Patient calls in due to concern for high blood sugars since stopping insulin. She took her first dose of the increased Ozempic 0.5 mg last Friday. 2 days ago CBGs were 200-300s, yesterday CBGs were mostly 200s, and today's readings have been <180. Explained that we were likely seeing the effects of the Ozempic staring to work. Do not feel that adding back insulin is indicated at this time given her recent history of hypoglycemia.   Plan - continue current therapy - f/u phone call with Butch Penny on Friday to ensure CBGs remains stable

## 2019-10-31 NOTE — Progress Notes (Signed)
  [facility] Res[facility] Telephone Encounter Continuity Care Appointment  HPI:   This telephone encounter was created for [name] on 10/31/2019 for the following purpose/cc hyperglycemia.   Past Medical History:  Past Medical History:  Diagnosis Date  . Bilateral lower extremity edema    right > left  . Bipolar 1 disorder ([place])   . CKD (chronic kidney disease) stage 3, GFR 30-59 ml/min     Dr Lawson Radar, [facility]  . Diabetes, polyneuropathy ([place])    FEET AND FINGER TIPS  . Fibromyalgia   . GERD (gastroesophageal reflux disease)   . History of chronic gastritis   . Hyperlipidemia   . Hypertension    per pt was take off bp medication because of kidney disease told by her nephrologist  . Hypothyroidism   . Mixed incontinence urge and stress   . OA (osteoarthritis)    KNEES  . OAB (overactive bladder)   . PONV (postoperative nausea and vomiting)   . 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 Type 2 diabetes mellitus treated with insulin ([place])   . Wears glasses       ROS:   No headaches, light headedness.    Assessment / Plan / Recommendations:   Please see A&P under problem oriented charting for assessment of the patient's acute and chronic medical conditions.   As always, pt is advised that if symptoms worsen or new symptoms arise, they should go to an urgent care facility or to to ER for further evaluation.   Consent and Medical Decision Making:   Patient discussed with Dr. Dareen Piano  This is a telephone encounter between Toni Hill and Delice Bison on 10/31/2019 for hyperglycemia. The visit was conducted with the patient located at home and Delice Bison at [facility]. The patient's identity was confirmed using their DOB and current address. The patient has consented to being evaluated through a telephone encounter and understands the associated risks (an examination cannot be done and the patient may need to come in for  an appointment) / benefits (allows the patient to remain at home, decreasing exposure to coronavirus). I personally spent 11 minutes on medical discussion.

## 2019-10-31 NOTE — Progress Notes (Signed)
Internal Medicine Clinic Attending  Case discussed with Dr. Winters at the time of the visit.  We reviewed the resident's history and exam and pertinent patient test results.  I agree with the assessment, diagnosis, and plan of care documented in the resident's note.  

## 2019-11-01 ENCOUNTER — Encounter: Payer: Medicare Other | Admitting: Physical Therapy

## 2019-11-01 ENCOUNTER — Ambulatory Visit: Payer: Medicare Other | Admitting: Physical Therapy

## 2019-11-02 ENCOUNTER — Encounter: Payer: Medicare Other | Admitting: Physical Therapy

## 2019-11-02 ENCOUNTER — Telehealth: Payer: Self-pay | Admitting: Dietician

## 2019-11-02 NOTE — Telephone Encounter (Signed)
Have her come in sooner

## 2019-11-02 NOTE — Telephone Encounter (Signed)
Toni Hill found an Ozempic pen in her refrigerator so doesn't;t need a refill now.

## 2019-11-02 NOTE — Telephone Encounter (Signed)
Toni Hill called with her blood sugars:  at 9 this am, 189, 174 on phone at 11 AM 11/01/19- 203/262/251/260 10/31/19- 175/176/246/226  Complains of being dizzy and sneezing yesterday and today.  She is only drinking water, not eating well, reports no appetite Breakfast- Leftover from the night before Chicken alfredo lunch-Banana Dinner- she cannot remember Snack- half a banana  We discussed that the increased dose of Ozempic may take 2 weeks to see the full effect and that it is helping to decrease her appetite, but that she should still try to eat small balanced meals.  Plans to take 0.5 mg ozempic todat. Toni Hill has a folow up on 11/19/19 with Dr. Gilford Rile

## 2019-11-05 ENCOUNTER — Encounter: Payer: Medicare Other | Admitting: Physical Therapy

## 2019-11-05 NOTE — Progress Notes (Signed)
Internal Medicine Clinic Attending  Case discussed with Dr. Bloomfield at the time of the visit.  We reviewed the resident's history and exam and pertinent patient test results.  I agree with the assessment, diagnosis, and plan of care documented in the resident's note.  

## 2019-11-06 ENCOUNTER — Other Ambulatory Visit: Payer: Self-pay | Admitting: *Deleted

## 2019-11-06 ENCOUNTER — Encounter: Payer: Medicare Other | Admitting: Physical Therapy

## 2019-11-07 ENCOUNTER — Encounter: Payer: Medicare Other | Admitting: Physical Therapy

## 2019-11-07 MED ORDER — EZETIMIBE 10 MG PO TABS: 10.0000 mg | ORAL_TABLET | Freq: Every evening | ORAL | 1 refills | Status: DC

## 2019-11-08 ENCOUNTER — Encounter: Payer: Medicare Other | Admitting: Physical Therapy

## 2019-11-09 ENCOUNTER — Encounter: Payer: Medicare Other | Admitting: Physical Therapy

## 2019-11-12 ENCOUNTER — Encounter: Payer: Medicare Other | Admitting: Physical Therapy

## 2019-11-14 ENCOUNTER — Encounter: Payer: Medicare Other | Admitting: Physical Therapy

## 2019-11-16 ENCOUNTER — Encounter: Payer: Medicare Other | Admitting: Physical Therapy

## 2019-11-16 ENCOUNTER — Ambulatory Visit: Payer: Medicare Other | Admitting: Physician Assistant

## 2019-11-19 ENCOUNTER — Ambulatory Visit (INDEPENDENT_AMBULATORY_CARE_PROVIDER_SITE_OTHER): Payer: Medicare Other | Admitting: Internal Medicine

## 2019-11-19 ENCOUNTER — Ambulatory Visit: Payer: Medicare Other | Admitting: Dietician

## 2019-11-19 ENCOUNTER — Other Ambulatory Visit: Payer: Self-pay

## 2019-11-19 ENCOUNTER — Encounter: Payer: Self-pay | Admitting: Internal Medicine

## 2019-11-19 VITALS — BP 127/68 | HR 92 | Temp 98.0°F | Ht 69.0 in | Wt 189.1 lb

## 2019-11-19 DIAGNOSIS — Z01818 Encounter for other preprocedural examination: Secondary | ICD-10-CM | POA: Diagnosis not present

## 2019-11-19 DIAGNOSIS — Z794 Long term (current) use of insulin: Secondary | ICD-10-CM

## 2019-11-19 DIAGNOSIS — E1165 Type 2 diabetes mellitus with hyperglycemia: Secondary | ICD-10-CM

## 2019-11-19 DIAGNOSIS — Z79899 Other long term (current) drug therapy: Secondary | ICD-10-CM

## 2019-11-19 DIAGNOSIS — E1140 Type 2 diabetes mellitus with diabetic neuropathy, unspecified: Secondary | ICD-10-CM

## 2019-11-19 LAB — POCT GLYCOSYLATED HEMOGLOBIN (HGB A1C): Hemoglobin A1C: 9 % — AB (ref 4.0–5.6)

## 2019-11-19 LAB — GLUCOSE, CAPILLARY: Glucose-Capillary: 323 mg/dL — ABNORMAL HIGH (ref 70–99)

## 2019-11-19 MED ORDER — OZEMPIC (0.25 OR 0.5 MG/DOSE) 2 MG/1.5ML ~~LOC~~ SOPN: 1.0000 mg | PEN_INJECTOR | SUBCUTANEOUS | 1 refills | Status: DC

## 2019-11-19 NOTE — Progress Notes (Signed)
CC: Diabetes  HPI:  [name] is a 74 y.o., with a PMH noted below, who presents to the [facility] for a follow up on her diabetes. To see the management of her acute and chronic conditions, please see the separate A&P note under the encounter tab.   Past Medical History:  Diagnosis Date  . Bilateral lower extremity edema    right > left  . Bipolar 1 disorder ([place])   . CKD (chronic kidney disease) stage 3, GFR 30-59 ml/min     Dr Lawson Radar, [facility]  . Diabetes, polyneuropathy ([place])    FEET AND FINGER TIPS  . Fibromyalgia   . GERD (gastroesophageal reflux disease)   . History of chronic gastritis   . Hyperlipidemia   . Hypertension    per pt was take off bp medication because of kidney disease told by her nephrologist  . Hypothyroidism   . Mixed incontinence urge and stress   . OA (osteoarthritis)    KNEES  . OAB (overactive bladder)   . PONV (postoperative nausea and vomiting)   . 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 Type 2 diabetes mellitus treated with insulin ([place])   . Wears glasses    Review of Systems:   Review of Systems  Constitutional: Positive for weight loss. Negative for diaphoresis and malaise/fatigue.  Eyes: Negative for blurred vision, double vision and photophobia.  Respiratory: Negative for cough and hemoptysis.   Cardiovascular: Negative for chest pain and palpitations.  Gastrointestinal: Negative for abdominal pain, constipation, diarrhea, nausea and vomiting.  Musculoskeletal: Negative for myalgias and neck pain.     Physical Exam:  Vitals:   11/19/19 1316  BP: 127/68  Pulse: 92  Temp: 98 F (36.7 C)  TempSrc: Oral  SpO2: 96%  Weight: 189 lb 1.6 oz (85.8 kg)  Height: 5\' 9"  (1.753 m)   Physical Exam Vitals and nursing note reviewed.  Constitutional:      General: She is not in acute distress.    Appearance: Normal appearance. She is not ill-appearing or toxic-appearing.  HENT:     Head: Normocephalic and  atraumatic.  Eyes:     General:        Right eye: No discharge.        Left eye: No discharge.     Conjunctiva/sclera: Conjunctivae normal.  Cardiovascular:     Rate and Rhythm: Normal rate and regular rhythm.     Pulses: Normal pulses.     Heart sounds: Normal heart sounds. No murmur. No friction rub. No gallop.   Pulmonary:     Effort: Pulmonary effort is normal.     Breath sounds: Normal breath sounds. No wheezing, rhonchi or rales.  Abdominal:     General: Bowel sounds are normal.     Palpations: Abdomen is soft.     Tenderness: There is no abdominal tenderness. There is no guarding.  Musculoskeletal:        General: No swelling.     Right lower leg: No edema.     Left lower leg: No edema.  Neurological:     General: No focal deficit present.     Mental Status: She is alert and oriented to person, place, and time.  Psychiatric:        Mood and Affect: Mood normal.        Behavior: Behavior normal.     Assessment & Plan:   See Encounters Tab for problem based charting.  Patient  discussed with Dr. Rebeca Alert

## 2019-11-19 NOTE — Patient Instructions (Signed)
To Ms. Michalina,  It was a pleasure to see you again! Today we discussed your diabetes medications and your surgical clearance for your colonoscopy and EGD. For your diabetes medication, we will increase the dose to 1.0, so please start on your next injection date. We also discussed your surgical clearance as well, and based off your physical examination and questionnaire answers, you will be cleared for surgery from the clinic's standpoint. I will see you in 6 weeks to see if you can make adjustments to your diabetes medications. Have a good day and be safe!  Sincerely,  Maudie Mercury, MD

## 2019-11-19 NOTE — Assessment & Plan Note (Addendum)
Patient presenting to the clinic for follow up on her diabetes medications. Recently switched from injectable insulins to ozempic 0.5.  Her A1c is 9.0% from 8.3%. Her CBG is 325.   Assessment:  Patient seen in the clinic with an increase in A1c and random 325. Patient unsure if she has taken her last dose of Ozempic. We discussed the importance of keeping track of medications dosages. We also discussed proper diet.   Plan:  - Increase Ozempic to 1.0 mg - FU in 6 weeks, check monitor for adjustments.

## 2019-11-19 NOTE — Assessment & Plan Note (Addendum)
Patient presenting for surgical clearance for EGD for dysphagia, and colonoscopy. Received clearance form from [facility].   RCRI: 0 points with a  3.9% with a 30 day risk of death.  DASI score: 4 METS  Assessment:  Patient presents to the [facility] for need of surgical clearance. While patient does have diabetes, her RCRI is 0 with a DASI index of 4 METS with an unremarkable physical examination. Given that endoscopies are low risk procedures patient clear for surgery.   Plan:  - CBG - will fax surgical clearance form.

## 2019-11-20 ENCOUNTER — Encounter: Payer: Self-pay | Admitting: Orthopaedic Surgery

## 2019-11-20 ENCOUNTER — Ambulatory Visit (INDEPENDENT_AMBULATORY_CARE_PROVIDER_SITE_OTHER): Payer: Medicare Other | Admitting: Orthopaedic Surgery

## 2019-11-20 ENCOUNTER — Telehealth: Payer: Self-pay | Admitting: Orthopaedic Surgery

## 2019-11-20 VITALS — Ht 69.0 in | Wt 189.0 lb

## 2019-11-20 DIAGNOSIS — Z96652 Presence of left artificial knee joint: Secondary | ICD-10-CM | POA: Diagnosis not present

## 2019-11-20 DIAGNOSIS — M25571 Pain in right ankle and joints of right foot: Secondary | ICD-10-CM

## 2019-11-20 NOTE — Telephone Encounter (Signed)
Pt called stating she wanted Dr. Erlinda Hong to include her left shoulder in the rx he wrote her due to her falling and hurting it.  [phone]

## 2019-11-20 NOTE — Progress Notes (Signed)
Office Visit Note   Patient: Toni Hill           Date of Birth: July 01, 1946           MRN: [id number] Visit Date: 11/20/2019              Requested by: Maudie Mercury, MD [address] [place],  [place] [address] PCP: Maudie Mercury, MD   Assessment & Plan: Visit Diagnoses:  1. Status post total left knee replacement   2. Pain in right ankle and joints of right foot     Plan: In terms of the ankle we will transition to an ASO.  For the left knee I think this is due to deconditioning and relative weakness.  She states that recently she has lost a fair amount of weight without any explanation.  Her doctor is exploring this.  I think she needs physical therapy for both the ankle and the left knee.  She is agreeable to these measures.  We will see her back as needed.  Follow-Up Instructions: Return if symptoms worsen or fail to improve.   Orders:  No orders of the defined types were placed in this encounter.  No orders of the defined types were placed in this encounter.     Procedures: No procedures performed   Clinical Data: No additional findings.   Subjective: Chief Complaint  Patient presents with  . Left Knee - Follow-up    Left TKA 01/15/2019    [name] returns today for follow-up of her right ankle sprain and left knee pain.  She states that she has grinding under her kneecap when she tries to initiate sit to stand.  Her right ankle is feeling much better and she is ready to come out of the boot.   Review of Systems   Objective: Vital Signs: Ht 5\' 9"  (1.753 m)   Wt 189 lb (85.7 kg)   BMI 27.91 kg/m   Physical Exam  Ortho Exam Left knee shows a fully healed surgical scar.  No joint effusion.  She does have significant patellofemoral crepitus.  Patella tracking is normal. Right ankle exam is benign. Specialty Comments:  No specialty comments available.  Imaging: No results found.   PMFS History: Patient Active Problem List   Diagnosis Date Noted  . Preoperative evaluation to rule out surgical contraindication 11/19/2019  . Bilateral low back pain with sciatica 09/06/2019  . Peripheral neuropathy 09/06/2019  . Seborrheic keratoses 08/20/2019  . Left breast mass 08/20/2019  . History of left knee replacement 05/29/2019  . Macrocytic anemia 03/08/2019  . UTI (urinary tract infection) 03/04/2019  . Pain of left calf 01/26/2019  . AKI (acute kidney injury) ([place]) 01/25/2019  . Leg swelling 01/25/2019  . QT prolongation 01/25/2019  . Elevated d-dimer 01/23/2019  . Acute blood loss as cause of postoperative anemia 01/23/2019  . Cellulitis of leg, left 01/23/2019  . At risk for adverse drug event 01/22/2019  . Bipolar 1 disorder ([place])   . 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 Osteoarthritis of left knee 12/30/2018  . Anxiety 12/30/2018  . Stage 3 chronic kidney disease 06/28/2016  . Hypercholesterolemia 08/20/2015  . Type 2 diabetes mellitus ([place]) 02/28/2014  . Esophageal reflux 02/28/2014  . Stress incontinence, female 02/28/2014  . Acquired hypothyroidism 02/28/2014  . Essential hypertension 11/17/2011   Past Medical History:  Diagnosis Date  . Bilateral lower extremity edema    right > left  . Bipolar 1  disorder ([place])   . CKD (chronic kidney disease) stage 3, GFR 30-59 ml/min     Dr Lawson Radar, [facility]  . Diabetes, polyneuropathy ([place])    FEET AND FINGER TIPS  . Fibromyalgia   . GERD (gastroesophageal reflux disease)   . History of chronic gastritis   . Hyperlipidemia   . Hypertension    per pt was take off bp medication because of kidney disease told by her nephrologist  . Hypothyroidism   . Mixed incontinence urge and stress   . OA (osteoarthritis)    KNEES  . OAB (overactive bladder)   . PONV (postoperative nausea and vomiting)   . 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 Type 2 diabetes mellitus treated with insulin ([place])   . Wears glasses     Family  History  Problem Relation Age of Onset  . Breast cancer Mother 78  . Cancer Father        ? type  . Breast cancer Sister 30  . Breast cancer Maternal Grandmother        ? age    Past Surgical History:  Procedure Laterality Date  . CHOLECYSTECTOMY OPEN  1990's  . COLONOSCOPY  last one 07-17-2014  . CYSTOSCOPY WITH INJECTION N/A 10/29/2016   Procedure: CYSTOSCOPY WITH INJECTION BOTOX 100 UNITS, urethral dilation;  Surgeon: Carolan Clines, MD;  Location: [facility];  Service: Urology;  Laterality: N/A;  . ESOPHAGOGASTRODUODENOSCOPY  last one 07-01-2016  . EXCISIONAL BREAST BX  1990   BENIGN  . ORIF TIBIA & FIBULA FRACTURES  2010   retained rod  . TONSILLECTOMY  age 89  . TOTAL KNEE ARTHROPLASTY Left 01/15/2019   Procedure: LEFT TOTAL KNEE ARTHROPLASTY;  Surgeon: Leandrew Koyanagi, MD;  Location: [facility];  Service: Orthopedics;  Laterality: Left;  Marland Kitchen VAGINAL HYSTERECTOMY  1979  . VENTRAL HERNIA REPAIR  1990's   Social History   Occupational History  . Not on file  Tobacco Use  . Smoking status: Never Smoker  . Smokeless tobacco: Never Used  Substance and Sexual Activity  . Alcohol use: No  . Drug use: No  . Sexual activity: Not Currently

## 2019-11-20 NOTE — Progress Notes (Signed)
Internal Medicine Clinic Attending  Case discussed with Dr. Winters at the time of the visit.  We reviewed the resident's history and exam and pertinent patient test results.  I agree with the assessment, diagnosis, and plan of care documented in the resident's note.  Haleem Hanner, M.D., Ph.D.  

## 2019-11-22 NOTE — Telephone Encounter (Signed)
Yes that is fine

## 2019-11-22 NOTE — Telephone Encounter (Signed)
Please advise   Patient is requesting physical therapy for left shoulder due to old injury. States she has trouble putting deodorant on. Explained that she has not been evaluated for her shoulder by Dr. Erlinda Hong and may need office visit.   Patient is going to have physical therapy office fax request she cannot remember name of facility or phone number only that it [place] where [place].

## 2019-11-23 ENCOUNTER — Telehealth: Payer: Self-pay | Admitting: Orthopaedic Surgery

## 2019-11-23 ENCOUNTER — Encounter: Payer: Medicare Other | Admitting: Physical Therapy

## 2019-11-23 NOTE — Telephone Encounter (Signed)
Please send in PT for left knee strengthening.

## 2019-11-23 NOTE — Telephone Encounter (Signed)
Left voicemail Rx for physical therapy can pick picked up at front desk.

## 2019-11-23 NOTE — Telephone Encounter (Signed)
I have written  this out and laid on your desk. Needs signature from [employer]

## 2019-11-23 NOTE — Telephone Encounter (Signed)
Patient called requesting that script for physical therapy be mailed instead of pick up. Patient  States it's a far ride to our facility and if possible can it be mailed to [place] [id number] addressed to patient. Patient also wanted to know if appointment in July in needed. Please patient a call back. Patient phone number is [phone].

## 2019-11-23 NOTE — Telephone Encounter (Signed)
Please advise. I do not see at last OV where you referred to PT. Does patient need therapy?

## 2019-11-26 ENCOUNTER — Telehealth: Payer: Self-pay | Admitting: Dietician

## 2019-11-26 ENCOUNTER — Encounter: Payer: Medicare Other | Admitting: Physical Therapy

## 2019-11-26 NOTE — Telephone Encounter (Signed)
Toni Hill left a voicemail that " She took her 0.5 (Ozempic) when she got home last week. Her pen was on 0.5 so she took the 0.5 again.  Has her scared."  I returned her call, she said she is fine and knows she took the correct amount becauee her blood sugars are better.  145 today and  yesterday it was low too. She reports being "swimmy headed and nausea, got nausea pills from another doctor and little urine came out. " I urged her to drink plenty of fluids and call us if needed.  Nicle mentioned she cannot get a refill on her ozempic until July. [facility] The [facility] informed that the recent ozempic Instructions are correct, but she  needs a prescription for 1mg  pens.

## 2019-11-26 NOTE — Telephone Encounter (Signed)
I will send to her by mail.

## 2019-11-28 ENCOUNTER — Encounter: Payer: Medicare Other | Admitting: Physical Therapy

## 2019-11-28 NOTE — Telephone Encounter (Signed)
Thank you for reaching out, I agree with your advice about keeping up with her fluid intake. I will prescribe for the Ozempic 1 mg pens. We are not prescribing her Zofran at the clinic and will need to contact her provider for another refill. [facility] [place].

## 2019-11-28 NOTE — Telephone Encounter (Signed)
Called and explained Dr. Gilford Rile response.

## 2019-11-28 NOTE — Telephone Encounter (Signed)
Toni Hill is calling about getting more zofran, she says she only got 10 tablets from the recent prescription written for her.  She says she is taking it twice a day to help her get her food down. She understands that the nausea can be a aside effect of the increased dose of semaglutide but wants to continue with it because her blood sugar is much better. Asks to [facility] [place] Her blood sugar was 138 this am.

## 2019-11-29 NOTE — Telephone Encounter (Signed)
Will reach out to patient today!

## 2019-11-29 NOTE — Telephone Encounter (Signed)
Called  pt - stated Ozempic is causing the nausea, which is why she needs the Zofran or something else. Stated Dr Ralene Muskrat office will not refill rx. Thanks

## 2019-11-30 ENCOUNTER — Encounter: Payer: Medicare Other | Admitting: Physical Therapy

## 2019-11-30 ENCOUNTER — Telehealth: Payer: Self-pay | Admitting: Dietician

## 2019-11-30 MED ORDER — ONDANSETRON HCL 4 MG PO TABS: 4.0000 mg | ORAL_TABLET | Freq: Two times a day (BID) | ORAL | 3 refills | Status: DC

## 2019-11-30 MED ORDER — OZEMPIC (1 MG/DOSE) 2 MG/1.5ML ~~LOC~~ SOPN: 1.0000 mg | PEN_INJECTOR | SUBCUTANEOUS | 3 refills | Status: DC

## 2019-11-30 NOTE — Telephone Encounter (Signed)
Text message sent to Dr Gilford Rile.

## 2019-11-30 NOTE — Telephone Encounter (Signed)
Please call patient back. Pt states has not been called back yet.  Pt still having nausea and has questions about her medication.

## 2019-11-30 NOTE — Telephone Encounter (Signed)
Called pharmacy and issues with the ozempic were resolved. Left patient a voicemail message.

## 2019-11-30 NOTE — Telephone Encounter (Signed)
To all,  I spoke with Toni Hill about her medications, she has been taking the Zofran chronically and her last prescription from Dr. Jeffie Pollock lasted her 5 days. Will continue with the medication. We also discussed her Ozempic dosing and reconfirmed her dosage and I reached out to pharmacy about her medications. Both medications have [facility] [place].

## 2019-12-04 ENCOUNTER — Encounter: Payer: Medicare Other | Admitting: Gynecology

## 2019-12-06 ENCOUNTER — Ambulatory Visit: Payer: Medicare Other | Admitting: Physical Therapy

## 2019-12-06 ENCOUNTER — Other Ambulatory Visit: Payer: Self-pay

## 2019-12-07 ENCOUNTER — Encounter: Payer: Medicare Other | Admitting: Gynecology

## 2019-12-07 ENCOUNTER — Encounter: Payer: Self-pay | Admitting: Obstetrics and Gynecology

## 2019-12-07 ENCOUNTER — Ambulatory Visit (INDEPENDENT_AMBULATORY_CARE_PROVIDER_SITE_OTHER): Payer: Medicare Other | Admitting: Obstetrics and Gynecology

## 2019-12-07 VITALS — BP 120/66 | Ht 69.0 in | Wt 184.0 lb

## 2019-12-07 DIAGNOSIS — Z01419 Encounter for gynecological examination (general) (routine) without abnormal findings: Secondary | ICD-10-CM | POA: Diagnosis not present

## 2019-12-07 DIAGNOSIS — Z803 Family history of malignant neoplasm of breast: Secondary | ICD-10-CM

## 2019-12-07 NOTE — Progress Notes (Signed)
Toni Hill 1945/08/16 [id number]  SUBJECTIVE:  74 y.o. G2P2 female here for an annual routine gynecologic exam. She has no gynecologic concerns.  Current Outpatient Medications  Medication Sig Dispense Refill  . amLODipine (NORVASC) 5 MG tablet Take 5 mg by mouth daily.    . baclofen (LIORESAL) 10 MG tablet TAKE 0.5-1 TABLETS (5-10 MG TOTAL) BY MOUTH 3 (THREE) TIMES DAILY AS NEEDED FOR MUSCLE SPASMS. 120 tablet 3  . Calcium Carb-Cholecalciferol (CALCIUM 600/VITAMIN D3) 600-800 MG-UNIT TABS Take 1 tablet by mouth daily.     . Cholecalciferol (EQL VITAMIN D3) 50 MCG (2000 UT) CAPS Take 2,000 Units by mouth daily.     Marland Kitchen ezetimibe (ZETIA) 10 MG tablet Take 1 tablet (10 mg total) by mouth every evening. 90 tablet 1  . gabapentin (NEURONTIN) 300 MG capsule Take 1 capsule (300 mg total) by mouth daily as needed (pain and neuropathy). 30 capsule 2  . hydrocortisone 2.5 % cream Apply 1 application topically 2 (two) times daily as needed (dry skin).    Marland Kitchen levothyroxine (SYNTHROID) 125 MCG tablet Take 1 tablet (125 mcg total) by mouth daily before breakfast. 90 tablet 3  . ondansetron (ZOFRAN) 4 MG tablet Take 1 tablet (4 mg total) by mouth 2 (two) times daily. 180 tablet 3  . Semaglutide, 1 MG/DOSE, (OZEMPIC, 1 MG/DOSE,) 2 MG/1.5ML SOPN Inject 1 mg into the skin once a week. 6 pen 3  . sertraline (ZOLOFT) 100 MG tablet Take 1 tablet (100 mg total) by mouth daily. 90 tablet 1  . simvastatin (ZOCOR) 40 MG tablet Take 1 tablet (40 mg total) by mouth every evening. 90 tablet 3  . vitamin B-12 (CYANOCOBALAMIN) 500 MCG tablet Take 1,000 mcg by mouth daily.    Marland Kitchen lisinopril (ZESTRIL) 10 MG tablet Take 10 mg by mouth daily.     No current facility-administered medications for this visit.   Allergies: Bactrim [sulfamethoxazole-trimethoprim], Morphine and related, Vioxx [rofecoxib], and Penicillins  No LMP recorded. Patient has had a hysterectomy.  Past medical history,surgical history, problem list,  medications, allergies, family history and social history were all reviewed and documented as reviewed in the EPIC chart.  ROS:  Feeling well. No dyspnea or chest pain on exertion.  No abdominal pain, change in bowel habits, black or bloody stools.  No urinary tract symptoms. GYN ROS: no abnormal bleeding, pelvic pain or discharge, no breast pain or new or enlarging lumps on self exam. No neurological complaints.   OBJECTIVE:  Ht 5\' 9"  (1.753 m)   Wt 184 lb (83.5 kg)   BMI 27.17 kg/m  The patient appears well, alert, oriented x 3, in no distress. ENT normal.  Neck supple. No cervical or supraclavicular adenopathy or thyromegaly.  Lungs are clear, good air entry, no wheezes, rhonchi or rales. S1 and S2 normal, no murmurs, regular rate and rhythm.  Abdomen soft without tenderness, guarding, mass or organomegaly.  Neurological is normal, no focal findings.  BREAST EXAM: breasts appear normal, no suspicious masses, no skin or nipple changes or axillary nodes  PELVIC EXAM: VULVA: normal appearing vulva with no masses, tenderness or lesions, VAGINA: normal appearing vagina with normal color and discharge, no lesions, CERVIX: surgically absent, UTERUS: surgically absent, vaginal cuff normal, ADNEXA: no masses, non tender  Chaperone: Caryn Bee present during the examination   ASSESSMENT:  74 y.o. G2P2 here for annual gynecologic exam  PLAN:   1. Postmenopausal. Prior hysterectomy for leiomyoma.  No significant hot flashes or night sweats.  No vaginal bleeding. 2. Pap smear was last done a number of years ago she denies any history of abnormal Pap smears.  She is comfortable with not screening based on her previous Pap smear and hysterectomy history and age criteria according to most current guidelines.   3. Mammogram 2021.  Normal breast exam today.  She notes a strong family history of breast cancer on maternal side including her mother, maternal grandmother, and 2 sisters.  States her  daughter underwent genetic testing. 4. Colonoscopy 2019.  Recommended that she follow up at the recommended interval.  5. DEXA sounds like it was done a few years ago but she plans to follow-up with her primary care doctor for bone health surveillance. 6. Health maintenance.  No labs today as she normally has these completed with her primary care provider.    Return annually or sooner, prn.  Joseph Pierini MD 12/07/19

## 2019-12-09 ENCOUNTER — Other Ambulatory Visit: Payer: Self-pay | Admitting: Internal Medicine

## 2019-12-10 ENCOUNTER — Encounter: Payer: Self-pay | Admitting: Internal Medicine

## 2019-12-31 ENCOUNTER — Telehealth: Payer: Self-pay | Admitting: *Deleted

## 2019-12-31 NOTE — Telephone Encounter (Signed)
Pt calls, states she cannot keep anything down except fresh cherries, states she vomits and vomits. Blood sugar up and down- 79 to 169. Pt states she started this after colonoscopy, she is advised to call dr smith's office and ask for advice. States she would like to increase zofran to 8mg  at each dose. Please advise. She refused an appt stating she doesn't have transportation.

## 2020-01-01 ENCOUNTER — Encounter (HOSPITAL_COMMUNITY): Payer: Self-pay | Admitting: Emergency Medicine

## 2020-01-01 ENCOUNTER — Other Ambulatory Visit: Payer: Self-pay

## 2020-01-01 ENCOUNTER — Encounter: Payer: Self-pay | Admitting: Internal Medicine

## 2020-01-01 ENCOUNTER — Inpatient Hospital Stay (HOSPITAL_COMMUNITY)
Admission: EM | Admit: 2020-01-01 | Discharge: 2020-01-05 | DRG: 682 | Disposition: A | Discharge status: Home / Self Care | Payer: Medicare Other | Source: Ambulatory Visit | Attending: Internal Medicine | Admitting: Internal Medicine

## 2020-01-01 ENCOUNTER — Ambulatory Visit (INDEPENDENT_AMBULATORY_CARE_PROVIDER_SITE_OTHER): Payer: Medicare Other | Admitting: Internal Medicine

## 2020-01-01 VITALS — BP 117/69 | HR 104 | Temp 97.6°F | Ht 69.0 in | Wt 175.6 lb

## 2020-01-01 DIAGNOSIS — R1013 Epigastric pain: Secondary | ICD-10-CM

## 2020-01-01 DIAGNOSIS — I10 Essential (primary) hypertension: Secondary | ICD-10-CM | POA: Diagnosis present

## 2020-01-01 DIAGNOSIS — N1832 Chronic kidney disease, stage 3b: Secondary | ICD-10-CM | POA: Diagnosis present

## 2020-01-01 DIAGNOSIS — I129 Hypertensive chronic kidney disease with stage 1 through stage 4 chronic kidney disease, or unspecified chronic kidney disease: Secondary | ICD-10-CM | POA: Diagnosis present

## 2020-01-01 DIAGNOSIS — E119 Type 2 diabetes mellitus without complications: Secondary | ICD-10-CM

## 2020-01-01 DIAGNOSIS — Z79899 Other long term (current) drug therapy: Secondary | ICD-10-CM

## 2020-01-01 DIAGNOSIS — Z885 Allergy status to narcotic agent status: Secondary | ICD-10-CM

## 2020-01-01 DIAGNOSIS — N1831 Chronic kidney disease, stage 3a: Secondary | ICD-10-CM | POA: Diagnosis present

## 2020-01-01 DIAGNOSIS — Z20822 Contact with and (suspected) exposure to covid-19: Secondary | ICD-10-CM | POA: Diagnosis present

## 2020-01-01 DIAGNOSIS — Z6823 Body mass index (BMI) 23.0-23.9, adult: Secondary | ICD-10-CM

## 2020-01-01 DIAGNOSIS — T50995A Adverse effect of other drugs, medicaments and biological substances, initial encounter: Secondary | ICD-10-CM | POA: Diagnosis present

## 2020-01-01 DIAGNOSIS — E43 Unspecified severe protein-calorie malnutrition: Secondary | ICD-10-CM | POA: Diagnosis present

## 2020-01-01 DIAGNOSIS — E1142 Type 2 diabetes mellitus with diabetic polyneuropathy: Secondary | ICD-10-CM | POA: Diagnosis present

## 2020-01-01 DIAGNOSIS — N179 Acute kidney failure, unspecified: Secondary | ICD-10-CM

## 2020-01-01 DIAGNOSIS — Z882 Allergy status to sulfonamides status: Secondary | ICD-10-CM

## 2020-01-01 DIAGNOSIS — R112 Nausea with vomiting, unspecified: Secondary | ICD-10-CM | POA: Diagnosis not present

## 2020-01-01 DIAGNOSIS — E039 Hypothyroidism, unspecified: Secondary | ICD-10-CM | POA: Diagnosis present

## 2020-01-01 DIAGNOSIS — Z803 Family history of malignant neoplasm of breast: Secondary | ICD-10-CM

## 2020-01-01 DIAGNOSIS — F419 Anxiety disorder, unspecified: Secondary | ICD-10-CM | POA: Diagnosis present

## 2020-01-01 DIAGNOSIS — Z96652 Presence of left artificial knee joint: Secondary | ICD-10-CM | POA: Diagnosis present

## 2020-01-01 DIAGNOSIS — E860 Dehydration: Secondary | ICD-10-CM | POA: Diagnosis present

## 2020-01-01 DIAGNOSIS — R1310 Dysphagia, unspecified: Secondary | ICD-10-CM | POA: Diagnosis present

## 2020-01-01 DIAGNOSIS — M1990 Unspecified osteoarthritis, unspecified site: Secondary | ICD-10-CM | POA: Diagnosis present

## 2020-01-01 DIAGNOSIS — R634 Abnormal weight loss: Secondary | ICD-10-CM

## 2020-01-01 DIAGNOSIS — Z880 Allergy status to penicillin: Secondary | ICD-10-CM

## 2020-01-01 DIAGNOSIS — M797 Fibromyalgia: Secondary | ICD-10-CM | POA: Diagnosis present

## 2020-01-01 DIAGNOSIS — N183 Chronic kidney disease, stage 3 unspecified: Secondary | ICD-10-CM | POA: Diagnosis present

## 2020-01-01 DIAGNOSIS — Z79890 Hormone replacement therapy (postmenopausal): Secondary | ICD-10-CM

## 2020-01-01 DIAGNOSIS — I951 Orthostatic hypotension: Secondary | ICD-10-CM | POA: Diagnosis present

## 2020-01-01 DIAGNOSIS — F319 Bipolar disorder, unspecified: Secondary | ICD-10-CM | POA: Diagnosis present

## 2020-01-01 DIAGNOSIS — K5900 Constipation, unspecified: Secondary | ICD-10-CM | POA: Diagnosis not present

## 2020-01-01 DIAGNOSIS — Z881 Allergy status to other antibiotic agents status: Secondary | ICD-10-CM

## 2020-01-01 DIAGNOSIS — I4510 Unspecified right bundle-branch block: Secondary | ICD-10-CM | POA: Diagnosis present

## 2020-01-01 DIAGNOSIS — K222 Esophageal obstruction: Secondary | ICD-10-CM | POA: Diagnosis present

## 2020-01-01 DIAGNOSIS — E1122 Type 2 diabetes mellitus with diabetic chronic kidney disease: Secondary | ICD-10-CM | POA: Diagnosis present

## 2020-01-01 DIAGNOSIS — E785 Hyperlipidemia, unspecified: Secondary | ICD-10-CM | POA: Diagnosis present

## 2020-01-01 DIAGNOSIS — R1110 Vomiting, unspecified: Secondary | ICD-10-CM | POA: Insufficient documentation

## 2020-01-01 DIAGNOSIS — K2950 Unspecified chronic gastritis without bleeding: Secondary | ICD-10-CM | POA: Diagnosis present

## 2020-01-01 DIAGNOSIS — R066 Hiccough: Secondary | ICD-10-CM | POA: Diagnosis present

## 2020-01-01 DIAGNOSIS — Z888 Allergy status to other drugs, medicaments and biological substances status: Secondary | ICD-10-CM

## 2020-01-01 DIAGNOSIS — K219 Gastro-esophageal reflux disease without esophagitis: Secondary | ICD-10-CM | POA: Diagnosis present

## 2020-01-01 DIAGNOSIS — R197 Diarrhea, unspecified: Secondary | ICD-10-CM | POA: Diagnosis present

## 2020-01-01 LAB — URINALYSIS, ROUTINE W REFLEX MICROSCOPIC
Bilirubin Urine: NEGATIVE
Glucose, UA: >=500 mg/dL — AB
Hgb urine dipstick: NEGATIVE
Ketones, ur: NEGATIVE mg/dL
Nitrite: NEGATIVE
Protein, ur: 30 mg/dL — AB
Specific Gravity, Urine: 1.033 — ABNORMAL HIGH (ref 1.005–1.030)
pH: 5 (ref 5.0–8.0)

## 2020-01-01 LAB — COMPREHENSIVE METABOLIC PANEL
ALT: 16 U/L (ref 0–44)
AST: 28 U/L (ref 15–41)
Albumin: 3.7 g/dL (ref 3.5–5.0)
Alkaline Phosphatase: 60 U/L (ref 38–126)
Anion gap: 13 (ref 5–15)
BUN: 30 mg/dL — ABNORMAL HIGH (ref 8–23)
CO2: 27 mmol/L (ref 22–32)
Calcium: 9.5 mg/dL (ref 8.9–10.3)
Chloride: 100 mmol/L (ref 98–111)
Creatinine, Ser: 1.71 mg/dL — ABNORMAL HIGH (ref 0.44–1.00)
GFR calc Af Amer: 34 mL/min — ABNORMAL LOW (ref >60)
GFR calc non Af Amer: 29 mL/min — ABNORMAL LOW (ref >60)
Glucose, Bld: 386 mg/dL — ABNORMAL HIGH (ref 70–99)
Potassium: 4.5 mmol/L (ref 3.5–5.1)
Sodium: 140 mmol/L (ref 135–145)
Total Bilirubin: 0.6 mg/dL (ref 0.3–1.2)
Total Protein: 6.5 g/dL (ref 6.5–8.1)

## 2020-01-01 LAB — CBC
HCT: 47.3 % — ABNORMAL HIGH (ref 36.0–46.0)
Hemoglobin: 14.6 g/dL (ref 12.0–15.0)
MCH: 30.2 pg (ref 26.0–34.0)
MCHC: 30.9 g/dL (ref 30.0–36.0)
MCV: 97.7 fL (ref 80.0–100.0)
Platelets: 288 10*3/uL (ref 150–400)
RBC: 4.84 MIL/uL (ref 3.87–5.11)
RDW: 13.4 % (ref 11.5–15.5)
WBC: 6.9 10*3/uL (ref 4.0–10.5)
nRBC: 0 % (ref 0.0–0.2)

## 2020-01-01 LAB — LIPASE, BLOOD: Lipase: 22 U/L (ref 11–51)

## 2020-01-01 MED ORDER — SODIUM CHLORIDE 0.9% FLUSH: 3.0000 mL | Freq: Once | INTRAVENOUS | Status: DC

## 2020-01-01 NOTE — Progress Notes (Signed)
CC: Abdominal pain, nausea, and vomiting  HPI:  [name] is a 74 y.o. with a history of GERD, PONV, and dysphagia requiring esophageal dilation in the past coming in with a 2 week history of nausea, vomiting, and epigastric pain. Reports that the emesis was green in color. She has had poor PO intake and has had about a 9 lb weight loss over 2 weeks. Has only has been eating fruits including cherries and cantaloupe. She also endorses feeling generally unwell, reports feeling lightheaded and dizzy when she stands up. She reports that she had drank the colonoscopy prep around this time which is when it started. She denies any fevers, chills, headaches, hematemesis, hematochezia or melena. She tried taking the Zofran 4 mg which she reports did not help. She denies any recent travel or any sick contacts. She tried contact Dr. Darliss Ridgel office however was told that he would not be in there until next week.   Past Medical History:  Diagnosis Date  . Bilateral lower extremity edema    right > left  . Bipolar 1 disorder ([place])   . CKD (chronic kidney disease) stage 3, GFR 30-59 ml/min     Dr Lawson Radar, [facility]  . Diabetes, polyneuropathy ([place])    FEET AND FINGER TIPS  . Fibromyalgia   . GERD (gastroesophageal reflux disease)   . History of chronic gastritis   . Hyperlipidemia   . Hypertension    per pt was take off bp medication because of kidney disease told by her nephrologist  . Hypothyroidism   . Mixed incontinence urge and stress   . OA (osteoarthritis)    KNEES  . OAB (overactive bladder)   . PONV (postoperative nausea and vomiting)   . 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 Type 2 diabetes mellitus treated with insulin ([place])   . Wears glasses    Review of Systems:   Constitutional: Negative for chills and fever.  Respiratory: Negative for shortness of breath.   Cardiovascular: Negative for chest pain and leg swelling.  Gastrointestinal: Positive for  abdominal pain, nausea and vomiting.  Neurological: Positive for dizziness, negative for headaches.    Physical Exam:  Vitals:   01/01/20 1549  BP: 117/69  Pulse: (!) 104  Temp: 97.6 F (36.4 C)  TempSrc: Oral  SpO2: 97%  Weight: 175 lb 9.6 oz (79.7 kg)  Height: 5\' 9"  (1.753 m)   Physical Exam Constitutional:      Appearance: She is ill-appearing.     Comments: Tired appearing, NAD, sitting in chair  HENT:     Mouth/Throat:     Mouth: Mucous membranes are dry.     Pharynx: Oropharynx is clear.  Eyes:     Extraocular Movements: Extraocular movements intact.     Conjunctiva/sclera: Conjunctivae normal.  Cardiovascular:     Rate and Rhythm: Regular rhythm. Tachycardia present.     Pulses: Normal pulses.     Heart sounds: Normal heart sounds.  Pulmonary:     Effort: Pulmonary effort is normal.     Breath sounds: Normal breath sounds.  Abdominal:     General: Abdomen is flat. Bowel sounds are normal. There is no distension.     Palpations: Abdomen is soft.     Tenderness: There is abdominal tenderness (TTP over epigastric area). There is no guarding or rebound.  Musculoskeletal:        General: No swelling. Normal range of motion.  Skin:  General: Skin is warm and dry.     Capillary Refill: Capillary refill takes less than 2 seconds.  Neurological:     General: No focal deficit present.     Mental Status: She is oriented to person, place, and time.  Psychiatric:        Mood and Affect: Mood normal.        Behavior: Behavior normal.    Assessment & Plan:   See Encounters Tab for problem based charting.  Patient discussed with Dr. Philipp Ovens

## 2020-01-01 NOTE — Telephone Encounter (Signed)
I agree that patient should call back to Dr. Thompson Hill office. I would not want to raise her zofran without her being seen. It appears she does have a clinic appointment today.

## 2020-01-01 NOTE — Assessment & Plan Note (Signed)
  [name] is a 74 y.o. with a history of GERD, PONV, and dysphagia requiring esophageal dilation in the past coming in with a 2 week history of nausea, vomiting, and epigastric pain. Reports that the emesis was green in color. She has had poor PO intake and has had about a 9 lb weight loss over 2 weeks. She also endorses feeling generally unwell, reports feeling lightheaded and dizzy when she stands up. She reports that she had drank the colonoscopy prep around this time which is when it started. She denies any fevers, chills, headaches, hematemasis, hematochezia or melena. She tried taking the Zofran 4 mg which she reports did not help. She denies any recent travel or any sick contacts. She tried contact Dr. Darliss Ridgel office however was told that he would not be in there until next week.   On exam she has TTP over the epigastric area, normoactive bowel sounds, no guarding or rebound, she appears very dry on exam. BP was on the lower side at 117/69. Orthostatics were positive, systolic BP dropped from 559 to 103, she also reported feeling lightheaded when this happened.   Given the positive orthostatics, decreased PO intake and inability to tolerate PO x2 weeks will send patient to ED for further evaluation and fluids. Could be related to the prep allergy, she does have multiple allergies to medications causing nausea and vomiting.

## 2020-01-01 NOTE — Patient Instructions (Addendum)
[name],  It was a pleasure to see you today. Thank you for coming in.   Today we discussed your abdominal pain. I am sorry that you are not feeling well. Your vitals looked okay today. I have sent in a different medication to help with your nausea, you can take it prior to your meals. We are getting some labs and will contact you if they are abnormal.   Please return to clinic in 2-3 weeks or sooner if needed.   Thank you again for coming in.   Asencion Noble.D.

## 2020-01-01 NOTE — ED Triage Notes (Signed)
Pt c/o abdominal pain, nausea, vomiting and diarrhea since prepping for a colonoscopy on 12/19/19.

## 2020-01-02 ENCOUNTER — Emergency Department (HOSPITAL_COMMUNITY): Payer: Medicare Other

## 2020-01-02 ENCOUNTER — Other Ambulatory Visit: Payer: Self-pay

## 2020-01-02 DIAGNOSIS — N179 Acute kidney failure, unspecified: Secondary | ICD-10-CM | POA: Diagnosis present

## 2020-01-02 DIAGNOSIS — R112 Nausea with vomiting, unspecified: Secondary | ICD-10-CM

## 2020-01-02 DIAGNOSIS — M797 Fibromyalgia: Secondary | ICD-10-CM | POA: Diagnosis present

## 2020-01-02 DIAGNOSIS — Z79890 Hormone replacement therapy (postmenopausal): Secondary | ICD-10-CM | POA: Diagnosis not present

## 2020-01-02 DIAGNOSIS — Z881 Allergy status to other antibiotic agents status: Secondary | ICD-10-CM | POA: Diagnosis not present

## 2020-01-02 DIAGNOSIS — T50995A Adverse effect of other drugs, medicaments and biological substances, initial encounter: Secondary | ICD-10-CM | POA: Diagnosis present

## 2020-01-02 DIAGNOSIS — I129 Hypertensive chronic kidney disease with stage 1 through stage 4 chronic kidney disease, or unspecified chronic kidney disease: Secondary | ICD-10-CM | POA: Diagnosis present

## 2020-01-02 DIAGNOSIS — Z882 Allergy status to sulfonamides status: Secondary | ICD-10-CM | POA: Diagnosis not present

## 2020-01-02 DIAGNOSIS — Z888 Allergy status to other drugs, medicaments and biological substances status: Secondary | ICD-10-CM | POA: Diagnosis not present

## 2020-01-02 DIAGNOSIS — F319 Bipolar disorder, unspecified: Secondary | ICD-10-CM | POA: Diagnosis present

## 2020-01-02 DIAGNOSIS — Z885 Allergy status to narcotic agent status: Secondary | ICD-10-CM | POA: Diagnosis not present

## 2020-01-02 DIAGNOSIS — Z79899 Other long term (current) drug therapy: Secondary | ICD-10-CM | POA: Diagnosis not present

## 2020-01-02 DIAGNOSIS — E860 Dehydration: Secondary | ICD-10-CM | POA: Diagnosis present

## 2020-01-02 DIAGNOSIS — Z20822 Contact with and (suspected) exposure to covid-19: Secondary | ICD-10-CM | POA: Diagnosis present

## 2020-01-02 DIAGNOSIS — N1832 Chronic kidney disease, stage 3b: Secondary | ICD-10-CM | POA: Diagnosis present

## 2020-01-02 DIAGNOSIS — R197 Diarrhea, unspecified: Secondary | ICD-10-CM | POA: Diagnosis present

## 2020-01-02 DIAGNOSIS — E039 Hypothyroidism, unspecified: Secondary | ICD-10-CM | POA: Diagnosis present

## 2020-01-02 DIAGNOSIS — I4510 Unspecified right bundle-branch block: Secondary | ICD-10-CM | POA: Diagnosis present

## 2020-01-02 DIAGNOSIS — E1142 Type 2 diabetes mellitus with diabetic polyneuropathy: Secondary | ICD-10-CM | POA: Diagnosis present

## 2020-01-02 DIAGNOSIS — I951 Orthostatic hypotension: Secondary | ICD-10-CM | POA: Diagnosis present

## 2020-01-02 DIAGNOSIS — E43 Unspecified severe protein-calorie malnutrition: Secondary | ICD-10-CM | POA: Diagnosis present

## 2020-01-02 DIAGNOSIS — Z880 Allergy status to penicillin: Secondary | ICD-10-CM | POA: Diagnosis not present

## 2020-01-02 DIAGNOSIS — E1122 Type 2 diabetes mellitus with diabetic chronic kidney disease: Secondary | ICD-10-CM | POA: Diagnosis present

## 2020-01-02 DIAGNOSIS — E785 Hyperlipidemia, unspecified: Secondary | ICD-10-CM | POA: Diagnosis present

## 2020-01-02 HISTORY — DX: Nausea with vomiting, unspecified: R11.2

## 2020-01-02 LAB — SARS CORONAVIRUS 2 BY RT PCR (HOSPITAL ORDER, PERFORMED IN ~~LOC~~ HOSPITAL LAB): SARS Coronavirus 2: NEGATIVE

## 2020-01-02 LAB — CBG MONITORING, ED
Glucose-Capillary: 193 mg/dL — ABNORMAL HIGH (ref 70–99)
Glucose-Capillary: 196 mg/dL — ABNORMAL HIGH (ref 70–99)
Glucose-Capillary: 209 mg/dL — ABNORMAL HIGH (ref 70–99)

## 2020-01-02 LAB — GLUCOSE, CAPILLARY: Glucose-Capillary: 198 mg/dL — ABNORMAL HIGH (ref 70–99)

## 2020-01-02 LAB — COMPREHENSIVE METABOLIC PANEL
ALT: 16 U/L (ref 0–44)
AST: 24 U/L (ref 15–41)
Albumin: 3.1 g/dL — ABNORMAL LOW (ref 3.5–5.0)
Alkaline Phosphatase: 55 U/L (ref 38–126)
Anion gap: 8 (ref 5–15)
BUN: 29 mg/dL — ABNORMAL HIGH (ref 8–23)
CO2: 27 mmol/L (ref 22–32)
Calcium: 9.1 mg/dL (ref 8.9–10.3)
Chloride: 103 mmol/L (ref 98–111)
Creatinine, Ser: 1.47 mg/dL — ABNORMAL HIGH (ref 0.44–1.00)
GFR calc Af Amer: 41 mL/min — ABNORMAL LOW (ref >60)
GFR calc non Af Amer: 35 mL/min — ABNORMAL LOW (ref >60)
Glucose, Bld: 222 mg/dL — ABNORMAL HIGH (ref 70–99)
Potassium: 3.8 mmol/L (ref 3.5–5.1)
Sodium: 138 mmol/L (ref 135–145)
Total Bilirubin: 0.9 mg/dL (ref 0.3–1.2)
Total Protein: 5.6 g/dL — ABNORMAL LOW (ref 6.5–8.1)

## 2020-01-02 LAB — CBC
HCT: 41.1 % (ref 36.0–46.0)
Hemoglobin: 12.8 g/dL (ref 12.0–15.0)
MCH: 29.9 pg (ref 26.0–34.0)
MCHC: 31.1 g/dL (ref 30.0–36.0)
MCV: 96 fL (ref 80.0–100.0)
Platelets: 227 10*3/uL (ref 150–400)
RBC: 4.28 MIL/uL (ref 3.87–5.11)
RDW: 13.4 % (ref 11.5–15.5)
WBC: 7.2 10*3/uL (ref 4.0–10.5)
nRBC: 0 % (ref 0.0–0.2)

## 2020-01-02 MED ORDER — SODIUM CHLORIDE 0.9 % IV BOLUS
1000.0000 mL | Freq: Once | INTRAVENOUS | Status: AC
  Administered 2020-01-02: 1000 mL via INTRAVENOUS

## 2020-01-02 MED ORDER — INSULIN ASPART 100 UNIT/ML ~~LOC~~ SOLN: 0.0000 [IU] | Freq: Three times a day (TID) | SUBCUTANEOUS | Status: DC

## 2020-01-02 MED ORDER — IOHEXOL 9 MG/ML PO SOLN
ORAL | Status: AC
  Administered 2020-01-02: 500 mL via ORAL
  Filled 2020-01-02: qty 1000

## 2020-01-02 MED ORDER — VITAMIN D 25 MCG (1000 UNIT) PO TABS
2000.0000 [IU] | ORAL_TABLET | Freq: Every day | ORAL | Status: DC
  Administered 2020-01-03 – 2020-01-05 (×3): 2000 [IU] via ORAL
  Filled 2020-01-02 (×3): qty 2

## 2020-01-02 MED ORDER — ONDANSETRON HCL 4 MG/2ML IJ SOLN
4.0000 mg | Freq: Once | INTRAMUSCULAR | Status: AC
  Administered 2020-01-02: 4 mg via INTRAVENOUS
  Filled 2020-01-02: qty 2

## 2020-01-02 MED ORDER — LEVOTHYROXINE SODIUM 25 MCG PO TABS
125.0000 ug | ORAL_TABLET | Freq: Every day | ORAL | Status: DC
  Filled 2020-01-02: qty 1

## 2020-01-02 MED ORDER — SODIUM CHLORIDE 0.9 % IV SOLN: Freq: Once | INTRAVENOUS | Status: AC

## 2020-01-02 MED ORDER — SODIUM CHLORIDE 0.9% FLUSH
3.0000 mL | Freq: Two times a day (BID) | INTRAVENOUS | Status: DC
  Administered 2020-01-02 – 2020-01-05 (×7): 3 mL via INTRAVENOUS

## 2020-01-02 MED ORDER — VITAMIN B-12 1000 MCG PO TABS
1000.0000 ug | ORAL_TABLET | Freq: Every day | ORAL | Status: DC
  Administered 2020-01-03 – 2020-01-05 (×3): 1000 ug via ORAL
  Filled 2020-01-02 (×3): qty 1

## 2020-01-02 MED ORDER — ENOXAPARIN SODIUM 40 MG/0.4ML ~~LOC~~ SOLN
40.0000 mg | SUBCUTANEOUS | Status: DC
  Administered 2020-01-03 – 2020-01-05 (×4): 40 mg via SUBCUTANEOUS
  Filled 2020-01-02 (×3): qty 0.4

## 2020-01-02 MED ORDER — SIMVASTATIN 20 MG PO TABS
40.0000 mg | ORAL_TABLET | Freq: Every evening | ORAL | Status: DC
  Administered 2020-01-02 – 2020-01-04 (×3): 40 mg via ORAL
  Filled 2020-01-02 (×3): qty 2

## 2020-01-02 MED ORDER — SERTRALINE HCL 100 MG PO TABS
100.0000 mg | ORAL_TABLET | Freq: Every day | ORAL | Status: DC
  Administered 2020-01-03 – 2020-01-05 (×3): 100 mg via ORAL
  Filled 2020-01-02 (×3): qty 1

## 2020-01-02 MED ORDER — SODIUM CHLORIDE 0.9 % IV SOLN: INTRAVENOUS | Status: AC

## 2020-01-02 MED ORDER — ONDANSETRON HCL 4 MG/2ML IJ SOLN
4.0000 mg | Freq: Four times a day (QID) | INTRAMUSCULAR | Status: AC | PRN
  Administered 2020-01-03 (×2): 4 mg via INTRAVENOUS
  Filled 2020-01-02 (×2): qty 2

## 2020-01-02 MED ORDER — EZETIMIBE 10 MG PO TABS
10.0000 mg | ORAL_TABLET | Freq: Every evening | ORAL | Status: DC
  Administered 2020-01-03 – 2020-01-04 (×3): 10 mg via ORAL
  Filled 2020-01-02 (×4): qty 1

## 2020-01-02 MED ORDER — BACLOFEN 10 MG PO TABS
5.0000 mg | ORAL_TABLET | Freq: Three times a day (TID) | ORAL | Status: DC | PRN
  Administered 2020-01-03: 10 mg via ORAL
  Filled 2020-01-02: qty 1

## 2020-01-02 MED ORDER — BOOST / RESOURCE BREEZE PO LIQD CUSTOM
1.0000 | Freq: Three times a day (TID) | ORAL | Status: DC
  Administered 2020-01-03 – 2020-01-04 (×2): 1 via ORAL

## 2020-01-02 MED ORDER — CALCIUM CARBONATE-VITAMIN D 500-200 MG-UNIT PO TABS
1.0000 | ORAL_TABLET | Freq: Every day | ORAL | Status: DC
  Administered 2020-01-03 – 2020-01-05 (×4): 1 via ORAL
  Filled 2020-01-02 (×3): qty 1

## 2020-01-02 MED ORDER — GABAPENTIN 300 MG PO CAPS
300.0000 mg | ORAL_CAPSULE | Freq: Every day | ORAL | Status: DC | PRN
  Administered 2020-01-03 – 2020-01-05 (×2): 300 mg via ORAL
  Filled 2020-01-02 (×2): qty 1

## 2020-01-02 MED ORDER — IOHEXOL 9 MG/ML PO SOLN
500.0000 mL | ORAL | Status: AC
  Administered 2020-01-02: 500 mL via ORAL

## 2020-01-02 NOTE — ED Provider Notes (Signed)
[facility] EMERGENCY DEPARTMENT Provider Note   CSN: [id number] Arrival date & time: 01/01/20  1649     History Chief Complaint  Patient presents with  . Abdominal Pain    Toni Hill is a 74 y.o. female.  Patient with history of DM, HTN, HLD presents to ED from her primary provider's office for further evaluation of likely dehydration after daily nausea, vomiting, diarrhea and a reported 10 pound weight loss over the last 2 weeks. The patient reports dizziness when standing or walking which causes further nausea and SOB. No syncope. She reports epigastric abdominal pain. No fever. Symptoms are reported to have started after a 2 day prep for colonoscopy performed on 12/19/19. She does not feel she has any blood in emesis or stools.   The history is provided by the patient. No language interpreter was used.  Abdominal Pain Associated symptoms: diarrhea, fatigue, nausea, shortness of breath and vomiting   Associated symptoms: no chills and no fever        Past Medical History:  Diagnosis Date  . Bilateral lower extremity edema    right > left  . Bipolar 1 disorder ([place])   . CKD (chronic kidney disease) stage 3, GFR 30-59 ml/min     Dr Lawson Radar, [facility]  . Diabetes, polyneuropathy ([place])    FEET AND FINGER TIPS  . Fibromyalgia   . GERD (gastroesophageal reflux disease)   . History of chronic gastritis   . Hyperlipidemia   . Hypertension    per pt was take off bp medication because of kidney disease told by her nephrologist  . Hypothyroidism   . Mixed incontinence urge and stress   . OA (osteoarthritis)    KNEES  . OAB (overactive bladder)   . PONV (postoperative nausea and vomiting)   . 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 Type 2 diabetes mellitus treated with insulin ([place])   . Wears glasses     Patient Active Problem List   Diagnosis Date Noted  . Nausea & vomiting 01/01/2020  . Preoperative evaluation to rule out surgical  contraindication 11/19/2019  . Bilateral low back pain with sciatica 09/06/2019  . Peripheral neuropathy 09/06/2019  . Seborrheic keratoses 08/20/2019  . Left breast mass 08/20/2019  . History of left knee replacement 05/29/2019  . Macrocytic anemia 03/08/2019  . UTI (urinary tract infection) 03/04/2019  . Pain of left calf 01/26/2019  . AKI (acute kidney injury) ([place]) 01/25/2019  . Leg swelling 01/25/2019  . QT prolongation 01/25/2019  . Elevated d-dimer 01/23/2019  . Acute blood loss as cause of postoperative anemia 01/23/2019  . Cellulitis of leg, left 01/23/2019  . At risk for adverse drug event 01/22/2019  . Bipolar 1 disorder ([place])   . 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 Osteoarthritis of left knee 12/30/2018  . Anxiety 12/30/2018  . Stage 3 chronic kidney disease 06/28/2016  . Hypercholesterolemia 08/20/2015  . Type 2 diabetes mellitus ([place]) 02/28/2014  . Esophageal reflux 02/28/2014  . Stress incontinence, female 02/28/2014  . Acquired hypothyroidism 02/28/2014  . Essential hypertension 11/17/2011    Past Surgical History:  Procedure Laterality Date  . CHOLECYSTECTOMY OPEN  1990's  . COLONOSCOPY  last one 07-17-2014  . CYSTOSCOPY WITH INJECTION N/A 10/29/2016   Procedure: CYSTOSCOPY WITH INJECTION BOTOX 100 UNITS, urethral dilation;  Surgeon: Carolan Clines, MD;  Location: [facility];  Service: Urology;  Laterality: N/A;  . ESOPHAGOGASTRODUODENOSCOPY  last one 07-01-2016  . EXCISIONAL BREAST BX  1990   BENIGN  . ORIF TIBIA & FIBULA FRACTURES  2010   retained rod  . TONSILLECTOMY  age 36  . TOTAL KNEE ARTHROPLASTY Left 01/15/2019   Procedure: LEFT TOTAL KNEE ARTHROPLASTY;  Surgeon: Leandrew Koyanagi, MD;  Location: [facility];  Service: Orthopedics;  Laterality: Left;  Marland Kitchen VAGINAL HYSTERECTOMY  1979  . VENTRAL HERNIA REPAIR  35's     OB History    Gravida  2   Para  2   Term      Preterm      AB      Living  2     SAB        TAB      Ectopic      Multiple      Live Births              Family History  Problem Relation Age of Onset  . Breast cancer Mother 68  . Cancer Father        ? type  . Breast cancer Sister 34  . Breast cancer Maternal Grandmother        ? age    Social History   Tobacco Use  . Smoking status: Never Smoker  . Smokeless tobacco: Never Used  Vaping Use  . Vaping Use: Never used  Substance Use Topics  . Alcohol use: No  . Drug use: No    Home Medications Prior to Admission medications   Medication Sig Start Date End Date Taking? Authorizing Provider  amLODipine (NORVASC) 5 MG tablet Take 5 mg by mouth daily. 05/03/19   [provider]  baclofen (LIORESAL) 10 MG tablet TAKE 0.5-1 TABLETS (5-10 MG TOTAL) BY MOUTH 3 (THREE) TIMES DAILY AS NEEDED FOR MUSCLE SPASMS. 10/18/19   Hilts, Legrand Como, MD  Calcium Carb-Cholecalciferol (CALCIUM 600/VITAMIN D3) 600-800 MG-UNIT TABS Take 1 tablet by mouth daily.     [provider]  Cholecalciferol (EQL VITAMIN D3) 50 MCG (2000 UT) CAPS Take 2,000 Units by mouth daily.     [provider]  ezetimibe (ZETIA) 10 MG tablet Take 1 tablet (10 mg total) by mouth every evening. 11/07/19   Maudie Mercury, MD  gabapentin (NEURONTIN) 300 MG capsule Take 1 capsule (300 mg total) by mouth daily as needed (pain and neuropathy). 09/06/19   Kathi Ludwig, MD  hydrocortisone 2.5 % cream Apply 1 application topically 2 (two) times daily as needed (dry skin).    [provider]  levothyroxine (SYNTHROID) 125 MCG tablet Take 1 tablet (125 mcg total) by mouth daily before breakfast. 10/25/19   Maudie Mercury, MD  lisinopril (ZESTRIL) 10 MG tablet Take 10 mg by mouth daily. 12/29/18   [provider]  ondansetron (ZOFRAN) 4 MG tablet Take 1 tablet (4 mg total) by mouth 2 (two) times daily. 11/30/19   Maudie Mercury, MD  Semaglutide, 1 MG/DOSE, (OZEMPIC, 1 MG/DOSE,) 2 MG/1.5ML SOPN Inject 1 mg into the skin once a  week. 11/30/19   Maudie Mercury, MD  sertraline (ZOLOFT) 100 MG tablet TAKE 1 TABLET BY MOUTH EVERY DAY 12/10/19   Axel Filler, MD  simvastatin (ZOCOR) 40 MG tablet Take 1 tablet (40 mg total) by mouth every evening. 10/25/19   Maudie Mercury, MD  vitamin B-12 (CYANOCOBALAMIN) 500 MCG tablet Take 1,000 mcg by mouth daily.    [provider]    Allergies    Bactrim [sulfamethoxazole-trimethoprim], Morphine and related, Vioxx [  rofecoxib], and Penicillins  Review of Systems   Review of Systems  Constitutional: Positive for fatigue and unexpected weight change. Negative for chills and fever.  Respiratory: Positive for shortness of breath.   Cardiovascular: Negative.   Gastrointestinal: Positive for abdominal pain, diarrhea, nausea and vomiting. Negative for blood in stool.  Genitourinary: Positive for decreased urine volume. Negative for difficulty urinating.  Musculoskeletal: Negative.  Negative for myalgias.  Skin: Negative.   Neurological: Positive for weakness and light-headedness. Negative for syncope.    Physical Exam Updated Vital Signs BP (!) 141/72 (BP Location: Right Arm)   Pulse 95   Temp 98.1 F (36.7 C)   Resp 18   SpO2 98%   Physical Exam Vitals and nursing note reviewed.  Constitutional:      General: She is not in acute distress.    Appearance: She is well-developed. She is not toxic-appearing.  HENT:     Head: Normocephalic.     Mouth/Throat:     Comments: Dry oral mucosa. Eyes:     Comments: No conjunctival pallor.  Cardiovascular:     Rate and Rhythm: Normal rate and regular rhythm.     Heart sounds: No murmur heard.   Pulmonary:     Effort: Pulmonary effort is normal.     Breath sounds: Normal breath sounds. No wheezing, rhonchi or rales.  Abdominal:     General: Bowel sounds are decreased. There is no distension.     Palpations: Abdomen is soft.     Tenderness: There is abdominal tenderness in the epigastric area. There is no  guarding or rebound.  Musculoskeletal:        General: Normal range of motion.     Cervical back: Normal range of motion and neck supple.  Skin:    General: Skin is warm and dry.     Findings: No rash.  Neurological:     Mental Status: She is alert.     Cranial Nerves: No cranial nerve deficit.     ED Results / Procedures / Treatments   Labs (all labs ordered are listed, but only abnormal results are displayed) Labs Reviewed  COMPREHENSIVE METABOLIC PANEL - Abnormal; Notable for the following components:      Result Value   Glucose, Bld 386 (*)    BUN 30 (*)    Creatinine, Ser 1.71 (*)    GFR calc non Af Amer 29 (*)    GFR calc Af Amer 34 (*)    All other components within normal limits  CBC - Abnormal; Notable for the following components:   HCT 47.3 (*)    All other components within normal limits  URINALYSIS, ROUTINE W REFLEX MICROSCOPIC - Abnormal; Notable for the following components:   APPearance HAZY (*)    Specific Gravity, Urine 1.033 (*)    Glucose, UA >=500 (*)    Protein, ur 30 (*)    Leukocytes,Ua SMALL (*)    Bacteria, UA FEW (*)    All other components within normal limits  LIPASE, BLOOD   Results for orders placed or performed during the hospital encounter of 01/01/20  Lipase, blood  Result Value Ref Range   Lipase 22 11 - 51 U/L  Comprehensive metabolic panel  Result Value Ref Range   Sodium 140 135 - 145 mmol/L   Potassium 4.5 3.5 - 5.1 mmol/L   Chloride 100 98 - 111 mmol/L   CO2 27 22 - 32 mmol/L   Glucose, Bld 386 (H) 70 - 99  mg/dL   BUN 30 (H) 8 - 23 mg/dL   Creatinine, Ser 1.71 (H) 0.44 - 1.00 mg/dL   Calcium 9.5 8.9 - 10.3 mg/dL   Total Protein 6.5 6.5 - 8.1 g/dL   Albumin 3.7 3.5 - 5.0 g/dL   AST 28 15 - 41 U/L   ALT 16 0 - 44 U/L   Alkaline Phosphatase 60 38 - 126 U/L   Total Bilirubin 0.6 0.3 - 1.2 mg/dL   GFR calc non Af Amer 29 (L) >60 mL/min   GFR calc Af Amer 34 (L) >60 mL/min   Anion gap 13 5 - 15  CBC  Result Value Ref  Range   WBC 6.9 4.0 - 10.5 K/uL   RBC 4.84 3.87 - 5.11 MIL/uL   Hemoglobin 14.6 12.0 - 15.0 g/dL   HCT 47.3 (H) 36 - 46 %   MCV 97.7 80.0 - 100.0 fL   MCH 30.2 26.0 - 34.0 pg   MCHC 30.9 30.0 - 36.0 g/dL   RDW 13.4 11.5 - 15.5 %   Platelets 288 150 - 400 K/uL   nRBC 0.0 0.0 - 0.2 %  Urinalysis, Routine w reflex microscopic  Result Value Ref Range   Color, Urine YELLOW YELLOW   APPearance HAZY (A) CLEAR   Specific Gravity, Urine 1.033 (H) 1.005 - 1.030   pH 5.0 5.0 - 8.0   Glucose, UA >=500 (A) NEGATIVE mg/dL   Hgb urine dipstick NEGATIVE NEGATIVE   Bilirubin Urine NEGATIVE NEGATIVE   Ketones, ur NEGATIVE NEGATIVE mg/dL   Protein, ur 30 (A) NEGATIVE mg/dL   Nitrite NEGATIVE NEGATIVE   Leukocytes,Ua SMALL (A) NEGATIVE   RBC / HPF 0-5 0 - 5 RBC/hpf   WBC, UA 6-10 0 - 5 WBC/hpf   Bacteria, UA FEW (A) NONE SEEN   Squamous Epithelial / LPF 0-5 0 - 5   Mucus PRESENT    Hyaline Casts, UA PRESENT     EKG EKG Interpretation  Date/Time:  Tuesday January 01 2020 16:59:47 EDT Ventricular Rate:  99 PR Interval:  148 QRS Duration: 126 QT Interval:  384 QTC Calculation: 492 R Axis:   102 Text Interpretation: Normal sinus rhythm Right bundle branch block Abnormal ECG Confirmed by Thayer Jew [phone]) on 01/02/2020 2:56:48 AM   Radiology No results found.  Procedures Procedures (including critical care time)  Medications Ordered in ED Medications  sodium chloride flush (NS) 0.9 % injection 3 mL (has no administration in time range)    ED Course  I have reviewed the triage vital signs and the nursing notes.  Pertinent labs & imaging results that were available during my care of the patient were reviewed by me and considered in my medical decision making (see chart for details).    MDM Rules/Calculators/A&P                          Patient to ED with 2 weeks of vomiting and diarrhea, near syncope, weight loss.   She is awake, alert, in NAD. She appears dry with  concentrated urine, dry oral mucosa. Chart reviewed. Orthostatic blood pressures positive with lightheadedness in the Internal medicine Clinic prior to arrival.   IV fluids started. Will give Zofran IV and start PO challenge. CT ordered given tenderness and prolonged symptoms. She has a mild AKI now in the setting of CKD. Oral CM only.   She tolerate PO contrast with increased epigastric tenderness and nausea but no vomiting.  VS have been stable. Feel she will need admission for further evaluation of symptom control given orthostatic hypotension during clinic visit today, weight loss, symptoms persistent for 2 weeks, worsening kidney function. Internal medicine resident paged.   Final Clinical Impression(s) / ED Diagnoses Final diagnoses:  None   1. Prolonged nausea, vomiting, diarrhea 2. Dehydration 3. AKI  Rx / DC Orders ED Discharge Orders    None       Charlann Lange, PA-C 01/02/20 0266    Merryl Hacker, MD 01/03/20 [phone]

## 2020-01-02 NOTE — Progress Notes (Signed)
MD notified per pt request. Spoke with Dr. Court Joy - pt states she was not being given her  home medications completely and is requesting her night time medications. Med list verified and is correct in computer. MD to review list and order per his discretion. See new orders

## 2020-01-02 NOTE — ED Notes (Signed)
Pt  Reported she was 2 dizzy to stand for the 3 min for ortho vitals.

## 2020-01-02 NOTE — H&P (Addendum)
Date: 01/02/2020               Patient Name:  Toni Hill MRN: [id number]  DOB: [birth year] Age / Sex: 74 y.o., female   PCP: Toni Mercury, MD         Medical Service: Internal Medicine Teaching Service         Attending Physician: Toni. Aldine Contes, MD    First Contact: Toni. Gilford Hill Pager: [phone]  Second Contact: Toni. Eileen Hill Pager: [phone]       After Hours (After 5p/  First Contact Pager: [phone]  weekends / holidays): Second Contact Pager: [phone]   Chief Complaint: N/V  History of Present Illness: Toni Hill is a 74 yo F with Hx of Diabetes, HTN, GERD, CKD III, Hypothyroidism, Anxiety, Bipolar 1, and knee replacement who presents for on going nausea and vomiting.   Meds:  Current Meds  Medication Sig  . amLODipine (NORVASC) 5 MG tablet Take 5 mg by mouth daily.  . baclofen (LIORESAL) 10 MG tablet TAKE 0.5-1 TABLETS (5-10 MG TOTAL) BY MOUTH 3 (THREE) TIMES DAILY AS NEEDED FOR MUSCLE SPASMS. (Patient taking differently: Take 10 mg by mouth 2 (two) times daily. )  . Calcium Carb-Cholecalciferol (CALCIUM 600/VITAMIN D3) 600-800 MG-UNIT TABS Take 1 tablet by mouth daily.   . Cholecalciferol (EQL VITAMIN D3) 50 MCG (2000 UT) CAPS Take 2,000 Units by mouth daily.   Marland Kitchen ezetimibe (ZETIA) 10 MG tablet Take 1 tablet (10 mg total) by mouth every evening.  . gabapentin (NEURONTIN) 300 MG capsule Take 1 capsule (300 mg total) by mouth daily as needed (pain and neuropathy). (Patient taking differently: Take 300 mg by mouth every evening. )  . hydrocortisone 2.5 % cream Apply 1 application topically 2 (two) times daily.   Marland Kitchen levothyroxine (SYNTHROID) 125 MCG tablet Take 1 tablet (125 mcg total) by mouth daily before breakfast.  . ondansetron (ZOFRAN) 4 MG tablet Take 1 tablet (4 mg total) by mouth 2 (two) times daily.  . Semaglutide, 1 MG/DOSE, (OZEMPIC, 1 MG/DOSE,) 2 MG/1.5ML SOPN Inject 1 mg into the skin once a week. (Patient taking differently: Inject 1 mg into the skin once a  week. On Fridays.)  . sertraline (ZOLOFT) 100 MG tablet TAKE 1 TABLET BY MOUTH EVERY DAY (Patient taking differently: Take 100 mg by mouth in the morning. )  . simvastatin (ZOCOR) 40 MG tablet Take 1 tablet (40 mg total) by mouth every evening.  . vitamin B-12 (CYANOCOBALAMIN) 500 MCG tablet Take 1,000 mcg by mouth in the morning and at bedtime.    Allergies: Allergies as of 01/01/2020 - Review Complete 01/01/2020  Allergen Reaction Noted  . Bactrim [sulfamethoxazole-trimethoprim]  01/25/2019  . Morphine and related Nausea And Vomiting 12/01/2018  . Vioxx [rofecoxib]  11/20/2016  . Penicillins Hives and Itching 10/27/2016   Past Medical History:  Diagnosis Date  . Bilateral lower extremity edema    right > left  . Bipolar 1 disorder ([place])   . CKD (chronic kidney disease) stage 3, GFR 30-59 ml/min     Toni Hill, [facility]  . Diabetes, polyneuropathy ([place])    FEET AND FINGER TIPS  . Fibromyalgia   . GERD (gastroesophageal reflux disease)   . History of chronic gastritis   . Hyperlipidemia   . Hypertension    per pt was take off bp medication because of kidney disease told by her nephrologist  . Hypothyroidism   . Mixed incontinence urge and stress   .  OA (osteoarthritis)    KNEES  . OAB (overactive bladder)   . PONV (postoperative nausea and vomiting)   . 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 Type 2 diabetes mellitus treated with insulin ([place])   . Wears glasses    Family History:  Family History  Problem Relation Age of Onset  . Breast cancer Mother 61  . Cancer Father        ? type  . Breast cancer Sister 39  . Breast cancer Maternal Grandmother        ? age   Social History:  Social History   Tobacco Use  . Smoking status: Never Smoker  . Smokeless tobacco: Never Used  Vaping Use  . Vaping Use: Never used  Substance Use Topics  . Alcohol use: No  . Drug use: No   Review of Systems: A complete ROS was negative except as per  HPI.  Physical Exam: Blood pressure (!) 149/76, pulse 87, temperature 98.1 F (36.7 C), resp. rate 18, SpO2 99 %. Physical Exam Constitutional:      General: She is not in acute distress.    Appearance: Normal appearance.  Cardiovascular:     Rate and Rhythm: Normal rate and regular rhythm.     Pulses: Normal pulses.     Heart sounds: Normal heart sounds.  Pulmonary:     Effort: Pulmonary effort is normal. No respiratory distress.     Breath sounds: Normal breath sounds.  Abdominal:     General: Bowel sounds are normal. There is no distension.     Palpations: Abdomen is soft.     Tenderness: There is abdominal tenderness (EPigastric tenderness).  Musculoskeletal:        General: No swelling or deformity.  Skin:    General: Skin is warm and dry.  Neurological:     General: No focal deficit present.     Mental Status: Mental status is at baseline.    EKG: personally reviewed my interpretation is sinus rhythm, RBBB  CT Abd Pelvis WO: IMPRESSION: 1. No acute abdominal/pelvic findings, mass lesions or adenopathy. 2. Status post cholecystectomy. Mild associated common bile duct dilatation. 3. Stable left adrenal gland adenoma. 4. Bilateral renal cysts, some of which are hyperdense/hemorrhagic. 5. Aortic atherosclerosis.  Assessment & Plan by Problem: Active Problems:   Intractable nausea and vomiting  Nausea/Vomiting: On going for about 2 weeks after doing colon prep. ?Gastroparesis. With intermittent epigastric pain. Greenish emesis. No blood. Decreased PO intake because of this. Reports 9lb weight loss. Orthostatic in clinic and ED. QTc 492s in ED, repeat 491. Treat with IVF and PRN antiemetics while following QTc. CT scan showing chronic changes as above. - Supportive care with IVF - EKG, monitor QTc - PRN Zofran, 1-2 doses for now given QTc  A/CKD III: Cr 1.71 up from 1.4 in setting of N/V. Replete IV. - IVF - Monitor renal function  Diabetes: SSI  HTN: Hold BP  Meds in setting of orthostatic Hypotension, N/V.  GERD: Continue PPI  Hypothyroidism: Continue home synthroid  Anxiety  Bipolar 1: Continue home Zoloft  FEN: 100cc/hr, replete lytes prn, Clear diet VTE ppx: Lovenox Code Status: FULL   Dispo: Admit patient to Inpatient with expected length of stay greater than 2 midnights.  Signed: Neva Seat, MD 01/02/2020, 9:38 AM  After 5pm on weekdays and 1pm on weekends: On Call pager: [phone]

## 2020-01-02 NOTE — Progress Notes (Signed)
Internal Medicine Clinic Attending  Case discussed with Dr. Sherry Ruffing at the time of the visit.  We reviewed the resident's history and exam and pertinent patient test results.  I agree with the assessment, diagnosis, and plan of care documented in the resident's note.   Patient here with a few weeks of N/V and inability to tolerate PO. Symptomatic with lightheadedness and dizziness. Orthostatics positive. Patient sent to the ED for IVFs and further work up.

## 2020-01-02 NOTE — ED Notes (Signed)
Pt expressing interest in a more normal diet.

## 2020-01-03 LAB — CBC
HCT: 37.9 % (ref 36.0–46.0)
Hemoglobin: 12.2 g/dL (ref 12.0–15.0)
MCH: 30.6 pg (ref 26.0–34.0)
MCHC: 32.2 g/dL (ref 30.0–36.0)
MCV: 95 fL (ref 80.0–100.0)
Platelets: 215 10*3/uL (ref 150–400)
RBC: 3.99 MIL/uL (ref 3.87–5.11)
RDW: 13.3 % (ref 11.5–15.5)
WBC: 5.6 10*3/uL (ref 4.0–10.5)
nRBC: 0 % (ref 0.0–0.2)

## 2020-01-03 LAB — GLUCOSE, CAPILLARY
Glucose-Capillary: 154 mg/dL — ABNORMAL HIGH (ref 70–99)
Glucose-Capillary: 238 mg/dL — ABNORMAL HIGH (ref 70–99)
Glucose-Capillary: 171 mg/dL — ABNORMAL HIGH (ref 70–99)
Glucose-Capillary: 256 mg/dL — ABNORMAL HIGH (ref 70–99)

## 2020-01-03 LAB — BASIC METABOLIC PANEL
Anion gap: 7 (ref 5–15)
BUN: 18 mg/dL (ref 8–23)
CO2: 27 mmol/L (ref 22–32)
Calcium: 8.9 mg/dL (ref 8.9–10.3)
Chloride: 106 mmol/L (ref 98–111)
Creatinine, Ser: 1.21 mg/dL — ABNORMAL HIGH (ref 0.44–1.00)
GFR calc Af Amer: 51 mL/min — ABNORMAL LOW (ref >60)
GFR calc non Af Amer: 44 mL/min — ABNORMAL LOW (ref >60)
Glucose, Bld: 182 mg/dL — ABNORMAL HIGH (ref 70–99)
Potassium: 4.1 mmol/L (ref 3.5–5.1)
Sodium: 140 mmol/L (ref 135–145)

## 2020-01-03 LAB — TSH: TSH: 0.226 u[IU]/mL — ABNORMAL LOW (ref 0.350–4.500)

## 2020-01-03 LAB — T4, FREE: Free T4: 1.32 ng/dL — ABNORMAL HIGH (ref 0.61–1.12)

## 2020-01-03 MED ORDER — INSULIN ASPART 100 UNIT/ML ~~LOC~~ SOLN: 0.0000 [IU] | Freq: Three times a day (TID) | SUBCUTANEOUS | Status: DC

## 2020-01-03 MED ORDER — LACTATED RINGERS IV SOLN: INTRAVENOUS | Status: AC

## 2020-01-03 MED ORDER — LACTATED RINGERS IV SOLN: INTRAVENOUS | Status: DC

## 2020-01-03 MED ORDER — PROMETHAZINE HCL 25 MG/ML IJ SOLN
12.5000 mg | Freq: Four times a day (QID) | INTRAMUSCULAR | Status: DC | PRN
  Administered 2020-01-03: 12.5 mg via INTRAVENOUS
  Filled 2020-01-03: qty 1

## 2020-01-03 MED ORDER — INSULIN GLARGINE 100 UNIT/ML ~~LOC~~ SOLN
5.0000 [IU] | Freq: Every day | SUBCUTANEOUS | Status: DC
  Filled 2020-01-03 (×2): qty 0.05

## 2020-01-03 MED ORDER — LEVOTHYROXINE SODIUM 100 MCG PO TABS
100.0000 ug | ORAL_TABLET | Freq: Every day | ORAL | Status: DC
  Administered 2020-01-04: 100 ug via ORAL
  Filled 2020-01-03 (×2): qty 1

## 2020-01-03 MED ORDER — LACTATED RINGERS IV BOLUS
1000.0000 mL | Freq: Once | INTRAVENOUS | Status: AC
  Administered 2020-01-03: 1000 mL via INTRAVENOUS

## 2020-01-03 MED ORDER — PANTOPRAZOLE SODIUM 40 MG PO TBEC
40.0000 mg | DELAYED_RELEASE_TABLET | Freq: Every day | ORAL | Status: DC
  Administered 2020-01-03 – 2020-01-05 (×3): 40 mg via ORAL
  Filled 2020-01-03 (×3): qty 1

## 2020-01-03 NOTE — Progress Notes (Addendum)
Subjective:  74 yo female with PMH of DM, HTN, CKD who was admitted to the hospital for 2 weeks of retractable N/N and epigastric pain. Patient was examined at bedside. Reports diarrhea for the past 2 weeks since her colonoscopy, worse today. Was able to eat a little chicken broth yesterday. Patient continues having nausea and vomitting. Reports abdominal pain in her lower abdomen that is new today, and has continued epigastric pain. She is tolerating PO water but not drinking a lot.  Per chart review, on 12/19/19 had a colonoscopy for cancer screening and upper EGD for progressive dysphagia. Patient has had esophageal dilation in the past. She states that she had dysphagia with both solid and liquids. She endorsed weight loss.  Patient reports that her symptoms of N/V all started after the colonoscopy prep. Patient has been trying to reach her GI doctor but he is out of town until next week.   Patient also complains of frequent hiccups, which woke her up from sleep that also started after the colonoscopy prep.    Has been on same thyroid medication.    Objective:  Vital signs in last 24 hours: Vitals:   01/02/20 1945 01/02/20 2025 01/02/20 2310 01/03/20 0801  BP: 134/62  129/64 (!) 129/97  Pulse: 86  86 89  Resp: 18   18  Temp: 98.3 F (36.8 C)  97.8 F (36.6 C) 97.8 F (36.6 C)  TempSrc: Oral  Oral Oral  SpO2: 96%  99% 99%  Weight:  83.9 kg    Height:  5\' 9"  (1.753 m)     Review of Systems  Constitutional: Positive for malaise/fatigue and weight loss. Negative for fever.  HENT: Negative for hearing loss.   Respiratory: Negative for shortness of breath.   Cardiovascular: Negative for chest pain.  Gastrointestinal: Positive for abdominal pain, diarrhea, nausea and vomiting.  Skin: Negative for rash.    Physical Exam HENT:     Head: Normocephalic and atraumatic.  Eyes:     Extraocular Movements: Extraocular movements intact.  Cardiovascular:     Rate and Rhythm: Normal  rate.     Heart sounds: Normal heart sounds.  Abdominal:     General: Abdomen is flat. A surgical scar is present.     Palpations: There is no mass.     Tenderness: There is abdominal tenderness in the right lower quadrant and epigastric area. There is guarding.     Hernia: No hernia is present.  Skin:    General: Skin is dry.  Neurological:     General: No focal deficit present.     Mental Status: She is alert.  Psychiatric:        Mood and Affect: Mood is anxious.     Assessment/Plan:  Active Problems:   Intractable nausea and vomiting - Gastroparesis vs Gastroenteritis  - Continue supportive care and encourage PO fluid intake - Switch to Phenergan for antiemetic to avoid QT prolongation  - Started PO Protonix 40 mg daily  - Will follow up with her GI office to obtain the records of her colonoscopy and upper EGD.   Diarrhea  - Infectious causes vs malabsorption  - Stool sample PCR ordered  - Checking TSH - Continue hydrate with PO fluid    DM - Home regimen: semaglutide (holding) - Continue sliding scale insulin - Lantus 5 units qHS - Blood glucose is acceptable  Orthostatic hypotension - Secondary to poor PO intake - Continue PO fluid  - Continue holding home BP  med of amlodipine  AKI on CKD 3b - Creatine is back to baseline at 1.21 - Continue monitor renal function   Hypothyroidism - Ordered TSH - Continue home Synthroid 125 mcg daily  Anxiety - Continue Zoloft 100 mg daily   DVT proph: Lovenox   Prior to Admission Living Arrangement: Home Anticipated Discharge Location: Home Barriers to Discharge: ongoing diarrhea, nausea, and abdominal pain Dispo: Anticipated discharge in approximately 2-3 day(s).   Gaylan Gerold, DO 01/03/2020, 11:11 AM Pager: @[facility] @ After 5pm on weekdays and 1pm on weekends: On Call pager [phone]

## 2020-01-03 NOTE — Hospital Course (Signed)
Toni Hill is a 74 year old Caucasian woman with medical history significant for diabetes mellitus, hypertension, CKD stage IIIb who presented to the hospital with a 2-week complaint of intractable nausea, vomiting and abdominal pain in the setting of recent endoscopic procedures (EGD and colonoscopy).***   1.  Intractable nausea and vomiting:***

## 2020-01-04 DIAGNOSIS — E43 Unspecified severe protein-calorie malnutrition: Secondary | ICD-10-CM | POA: Diagnosis present

## 2020-01-04 DIAGNOSIS — R197 Diarrhea, unspecified: Secondary | ICD-10-CM | POA: Diagnosis present

## 2020-01-04 LAB — CBC
HCT: 37.7 % (ref 36.0–46.0)
HCT: 37.9 % (ref 36.0–46.0)
Hemoglobin: 11.9 g/dL — ABNORMAL LOW (ref 12.0–15.0)
Hemoglobin: 12.1 g/dL (ref 12.0–15.0)
MCH: 30.3 pg (ref 26.0–34.0)
MCH: 30.6 pg (ref 26.0–34.0)
MCHC: 31.6 g/dL (ref 30.0–36.0)
MCHC: 31.9 g/dL (ref 30.0–36.0)
MCV: 95.9 fL (ref 80.0–100.0)
MCV: 95.9 fL (ref 80.0–100.0)
Platelets: 216 10*3/uL (ref 150–400)
Platelets: 211 10*3/uL (ref 150–400)
RBC: 3.93 MIL/uL (ref 3.87–5.11)
RBC: 3.95 MIL/uL (ref 3.87–5.11)
RDW: 13.5 % (ref 11.5–15.5)
RDW: 13.4 % (ref 11.5–15.5)
WBC: 4.9 10*3/uL (ref 4.0–10.5)
WBC: 5.5 10*3/uL (ref 4.0–10.5)
nRBC: 0 % (ref 0.0–0.2)
nRBC: 0 % (ref 0.0–0.2)

## 2020-01-04 LAB — BASIC METABOLIC PANEL
Anion gap: 8 (ref 5–15)
Anion gap: 9 (ref 5–15)
BUN: 13 mg/dL (ref 8–23)
BUN: 10 mg/dL (ref 8–23)
CO2: 27 mmol/L (ref 22–32)
CO2: 24 mmol/L (ref 22–32)
Calcium: 8.8 mg/dL — ABNORMAL LOW (ref 8.9–10.3)
Calcium: 8.7 mg/dL — ABNORMAL LOW (ref 8.9–10.3)
Chloride: 107 mmol/L (ref 98–111)
Chloride: 108 mmol/L (ref 98–111)
Creatinine, Ser: 1.21 mg/dL — ABNORMAL HIGH (ref 0.44–1.00)
Creatinine, Ser: 1.19 mg/dL — ABNORMAL HIGH (ref 0.44–1.00)
GFR calc Af Amer: 51 mL/min — ABNORMAL LOW (ref >60)
GFR calc Af Amer: 52 mL/min — ABNORMAL LOW (ref >60)
GFR calc non Af Amer: 44 mL/min — ABNORMAL LOW (ref >60)
GFR calc non Af Amer: 45 mL/min — ABNORMAL LOW (ref >60)
Glucose, Bld: 154 mg/dL — ABNORMAL HIGH (ref 70–99)
Glucose, Bld: 298 mg/dL — ABNORMAL HIGH (ref 70–99)
Potassium: 3.6 mmol/L (ref 3.5–5.1)
Potassium: 4.3 mmol/L (ref 3.5–5.1)
Sodium: 142 mmol/L (ref 135–145)
Sodium: 141 mmol/L (ref 135–145)

## 2020-01-04 LAB — T3, FREE: T3, Free: 2 pg/mL (ref 2.0–4.4)

## 2020-01-04 LAB — GLUCOSE, CAPILLARY
Glucose-Capillary: 168 mg/dL — ABNORMAL HIGH (ref 70–99)
Glucose-Capillary: 282 mg/dL — ABNORMAL HIGH (ref 70–99)
Glucose-Capillary: 250 mg/dL — ABNORMAL HIGH (ref 70–99)
Glucose-Capillary: 226 mg/dL — ABNORMAL HIGH (ref 70–99)

## 2020-01-04 MED ORDER — LACTATED RINGERS IV BOLUS
1000.0000 mL | Freq: Once | INTRAVENOUS | Status: AC
  Administered 2020-01-04: 1000 mL via INTRAVENOUS

## 2020-01-04 MED ORDER — LACTATED RINGERS IV SOLN: INTRAVENOUS | Status: AC

## 2020-01-04 MED ORDER — ADULT MULTIVITAMIN W/MINERALS CH
1.0000 | ORAL_TABLET | Freq: Every day | ORAL | Status: DC
  Administered 2020-01-04 – 2020-01-05 (×2): 1 via ORAL
  Filled 2020-01-04 (×2): qty 1

## 2020-01-04 MED ORDER — SEMAGLUTIDE (1 MG/DOSE) 2 MG/1.5ML ~~LOC~~ SOPN
1.0000 mg | PEN_INJECTOR | SUBCUTANEOUS | Status: DC
  Administered 2020-01-04: 1 mg via SUBCUTANEOUS
  Filled 2020-01-04: qty 0.75

## 2020-01-04 MED ORDER — ALBUTEROL SULFATE (2.5 MG/3ML) 0.083% IN NEBU: 2.5000 mg | INHALATION_SOLUTION | Freq: Four times a day (QID) | RESPIRATORY_TRACT | Status: DC | PRN

## 2020-01-04 MED ORDER — PRO-STAT SUGAR FREE PO LIQD
30.0000 mL | Freq: Two times a day (BID) | ORAL | Status: DC
  Administered 2020-01-04: 30 mL via ORAL
  Filled 2020-01-04 (×3): qty 30

## 2020-01-04 NOTE — Progress Notes (Addendum)
Subjective: Patient assessed at bedside this morning. States that she feels better today. Denies any vomiting since yesterday, but reports significant diarrhea. Chart review showed that patient had 5 episodes of diarrhea since yesterday. Patient states that lower abdomial pain is improved, now only has epigastric pain. She notes dizziness when standing up from sitting position  for months, worse recently. She also reports dizziness with walking. She ate some cream of chicken soup and felt better for having eaten.   Patient requested the Ozempic medication which her daughter is bringing. Discussed that we usually use insulin in the hospital. She agrees to take insulin when hyperglycemic if we order her Ozempic.   Outpatient colonoscopy record shows 1 tubular adenoma removed from right colon.   Objective:  Vital signs in last 24 hours: Vitals:   01/03/20 1730 01/03/20 1809 01/03/20 2250 01/04/20 0753  BP: (!) 122/56  134/60 133/67  Pulse: 88  84 79  Resp: 19   12  Temp: 97.9 F (36.6 C)  98.1 F (36.7 C) 97.9 F (36.6 C)  TempSrc: Oral  Oral   SpO2: 98%  95% 95%  Weight:  72.5 kg    Height:      Physical Exam HENT:     Head: Normocephalic.  Eyes:     Extraocular Movements: Extraocular movements intact.  Cardiovascular:     Rate and Rhythm: Normal rate and regular rhythm.     Heart sounds: Normal heart sounds.  Abdominal:     General: Abdomen is flat. Bowel sounds are normal. There is no distension.     Palpations: There is no shifting dullness or hepatomegaly.     Tenderness: There is abdominal tenderness in the epigastric area. There is no guarding.  Skin:    General: Skin is warm.  Neurological:     Mental Status: She is alert.  Psychiatric:        Mood and Affect: Mood normal.     Assessment/Plan:  74 yo female with PMH of DM, HTN, CKD who was admitted to the hospital for 2 weeks of intractable N/N and epigastric pain  Principal Problem:   Intractable nausea and  vomiting Active Problems:   Type 2 diabetes mellitus (HCC)   Essential hypertension   Stage 3 chronic kidney disease  Intractable nausea and vomiting - Gastroparesis vs Gastroenteritis vs malobsorption - Continue supportive care and IV Lactates Ringer - Continue Phenergan and PO Protonix 40 mg daily  - Advance to soft diet to see if patient can tolerate. Supplement with Pro-Stat    Diarrhea  - Infectious causes vs malabsorption  - Stool sample PCR pending. - Continue hydrate with IVF    DM - Restart semaglutide per patient request. - Continue sliding scale Novolog  - Blood glucose has been elevated above 200 at times, semaglutide and SSI should help regulate   Orthostatic hypotension - Likely secondary to a combination of poor PO intake and BP medications - Continue hydration with IVF  - Continue holding home BP med of amlodipine   Hypothyroidism - Low TSH and elevated T4.  - Reduce the dosage of Synthroid to 100 mcg daily according to patient's weight loss - Recheck TSH in 6 weeks   AKI on CKD 3b - Creatine is at baseline - Continue monitor renal function    Anxiety - Continue Zoloft 100 mg daily   Diet: soft diet IVF: LR  DVT proph: Lovenox   Prior to Admission Living Arrangement: Anticipated Discharge Location: Barriers to Discharge:  Dispo: Anticipated discharge in approximately 1 day(s).   Gaylan Gerold, DO 01/04/2020, 11:50 AM Pager: [phone] After 5pm on weekdays and 1pm on weekends: On Call pager [phone]

## 2020-01-04 NOTE — Progress Notes (Signed)
Have been following Scheryl Darter. LPN with this patient all day with care and charting.

## 2020-01-04 NOTE — Progress Notes (Signed)
Initial Nutrition Assessment  DOCUMENTATION CODES:   Severe malnutrition in context of chronic illness  INTERVENTION:    Magic cup TID with meals, each supplement provides 290 kcal and 9 grams of protein  30 ml Prostat BID, each supplement provides 100 kcals and 15 grams protein.   MVI daily   NUTRITION DIAGNOSIS:   Severe Malnutrition related to chronic illness (uncontrolled DM) as evidenced by severe muscle depletion, moderate muscle depletion, percent weight loss.  GOAL:   Patient will meet greater than or equal to 90% of their needs  MONITOR:   PO intake, Supplement acceptance, Weight trends, Labs, I & O's  REASON FOR ASSESSMENT:   Malnutrition Screening Tool    ASSESSMENT:   Patient with PMH significant for HTN, HLD, DM, GERD, CKD III, anxiety, and bipolar disorder. Presents this admission with intractable nausea/vomiting secondary to suspected gastroparesis.   Pt endorses a loss in appetite over the last two week due to ongoing nausea/vomting. States during this time she tolerated cantaloupe and strawberries. Prior to this she consumed 2-3 meals daily that consisted of B-eggs, bacon, grits, gravy and D-meat, vegetable, grain. Appetite progressing this admission. She hasn't vomited in 2 days per reports. States she had history of dysphagia in the past but was resolved with dilation. She does not like Ensure or Boost. Discussed the importance of protein intake for preservation of lean body mass. Pt willing to try Magic Cups.   Pt endorses a UBW of 300 lb, the last time being at that weight two years ago. Reports slowly progressive unintentional wt loss over the years. Records indicate pt weighed 228 lb once year ago and 160 lb this admission (30% wt loss, significant for time frame).   Medications: calcium-vit D, SS novolog, Vit B12 Labs: CBG 171-256  NUTRITION - FOCUSED PHYSICAL EXAM:    Most Recent Value  Orbital Region No depletion  Upper Arm Region Moderate  depletion  Thoracic and Lumbar Region Unable to assess  Buccal Region No depletion  Temple Region Moderate depletion  Clavicle Bone Region Moderate depletion  Clavicle and Acromion Bone Region Moderate depletion  Scapular Bone Region Unable to assess  Dorsal Hand Mild depletion  Patellar Region Severe depletion  Anterior Thigh Region Severe depletion  Posterior Calf Region Severe depletion  Edema (RD Assessment) Mild  Hair Reviewed  Eyes Reviewed  Mouth Reviewed  Skin Reviewed  Nails Reviewed     Diet Order:   Diet Order            DIET SOFT Room service appropriate? Yes; Fluid consistency: Thin  Diet effective now                 EDUCATION NEEDS:   Not appropriate for education at this time  Skin:  Skin Assessment: Reviewed RN Assessment  Last BM:  7/1  Height:   Ht Readings from Last 1 Encounters:  01/02/20 5\' 9"  (1.753 m)    Weight:   Wt Readings from Last 1 Encounters:  01/03/20 72.5 kg    BMI:  Body mass index is 23.61 kg/m.  Estimated Nutritional Needs:   Kcal:  2100-2300 kcal  Protein:  105-120 grams  Fluid:  >/= 2.1 L/day   Mariana Single RD, LDN Clinical Nutrition Pager listed in [place]

## 2020-01-05 ENCOUNTER — Other Ambulatory Visit: Payer: Self-pay | Admitting: Internal Medicine

## 2020-01-05 DIAGNOSIS — R112 Nausea with vomiting, unspecified: Secondary | ICD-10-CM

## 2020-01-05 LAB — BASIC METABOLIC PANEL
Anion gap: 6 (ref 5–15)
BUN: 8 mg/dL (ref 8–23)
CO2: 28 mmol/L (ref 22–32)
Calcium: 9 mg/dL (ref 8.9–10.3)
Chloride: 107 mmol/L (ref 98–111)
Creatinine, Ser: 1.12 mg/dL — ABNORMAL HIGH (ref 0.44–1.00)
GFR calc Af Amer: 56 mL/min — ABNORMAL LOW (ref >60)
GFR calc non Af Amer: 49 mL/min — ABNORMAL LOW (ref >60)
Glucose, Bld: 215 mg/dL — ABNORMAL HIGH (ref 70–99)
Potassium: 4.2 mmol/L (ref 3.5–5.1)
Sodium: 141 mmol/L (ref 135–145)

## 2020-01-05 LAB — CBC
HCT: 37.8 % (ref 36.0–46.0)
Hemoglobin: 12.3 g/dL (ref 12.0–15.0)
MCH: 30.9 pg (ref 26.0–34.0)
MCHC: 32.5 g/dL (ref 30.0–36.0)
MCV: 95 fL (ref 80.0–100.0)
Platelets: 239 10*3/uL (ref 150–400)
RBC: 3.98 MIL/uL (ref 3.87–5.11)
RDW: 13.3 % (ref 11.5–15.5)
WBC: 5.5 10*3/uL (ref 4.0–10.5)
nRBC: 0 % (ref 0.0–0.2)

## 2020-01-05 LAB — GLUCOSE, CAPILLARY: Glucose-Capillary: 193 mg/dL — ABNORMAL HIGH (ref 70–99)

## 2020-01-05 MED ORDER — PANTOPRAZOLE SODIUM 40 MG PO TBEC: 40.0000 mg | DELAYED_RELEASE_TABLET | Freq: Every day | ORAL | 0 refills | Status: DC

## 2020-01-05 MED ORDER — METOCLOPRAMIDE HCL 5 MG PO TABS
5.0000 mg | ORAL_TABLET | Freq: Three times a day (TID) | ORAL | Status: DC
  Administered 2020-01-05: 5 mg via ORAL
  Filled 2020-01-05: qty 1

## 2020-01-05 MED ORDER — INSULIN GLARGINE 100 UNIT/ML ~~LOC~~ SOLN: 20.0000 [IU] | Freq: Every day | SUBCUTANEOUS | Status: DC

## 2020-01-05 MED ORDER — INSULIN GLARGINE 100 UNIT/ML ~~LOC~~ SOLN: 10.0000 [IU] | Freq: Every day | SUBCUTANEOUS | Status: DC

## 2020-01-05 MED ORDER — INSULIN GLARGINE 100 UNIT/ML SOLOSTAR PEN
15.0000 [IU] | PEN_INJECTOR | Freq: Every day | SUBCUTANEOUS | 11 refills | Status: DC
Start: 2020-01-05 — End: 2020-01-06

## 2020-01-05 MED ORDER — METOCLOPRAMIDE HCL 5 MG PO TABS: 5.0000 mg | ORAL_TABLET | Freq: Three times a day (TID) | ORAL | Status: DC

## 2020-01-05 MED ORDER — INSULIN GLARGINE 100 UNIT/ML ~~LOC~~ SOLN
15.0000 [IU] | Freq: Every day | SUBCUTANEOUS | Status: DC
  Filled 2020-01-05: qty 0.15

## 2020-01-05 NOTE — Progress Notes (Addendum)
Patient refused her levothyroxine this morning. Stated she doesn't take generic brand . She added the rounding MD told her to keep her synthroid from home with her in order to self administer.every morning.

## 2020-01-05 NOTE — Progress Notes (Signed)
Patient got dizzy with orthostatic standing so it wasn't completed.

## 2020-01-05 NOTE — Progress Notes (Addendum)
Subjective: HD#3   Overnight:she is tearful this morning because of an interaction with 2 staff members last night. Patient wanted to take her home synthroid and is upset about not being allowed to use her home dose. Discussed with patient that we are changing her dose and her home tablet may not be appropriate.  Today, Toni Hill complains of nausea and constipation. She is concerned that she did not have a BM yesterday, though she had 5 recorded the day before. She feels that she has stool but cannot get it out.   Sister, Toni Hill: [phone] (work), [phone] (cell)  Objective:  Vital signs in last 24 hours: Vitals:   01/03/20 2250 01/04/20 0753 01/04/20 1618 01/04/20 2236  BP: 134/60 133/67 (!) 142/66 (!) 148/60  Pulse: 84 79 86 82  Resp:  12    Temp: 98.1 F (36.7 C) 97.9 F (36.6 C) 98.2 F (36.8 C) 98.1 F (36.7 C)  TempSrc: Oral   Oral  SpO2: 95% 95% 97% 98%  Weight:      Height:       Const: In no apparent distress, tearful CV: RRR, no murmurs, gallop, rub Abd: Bowel sounds present, nondistended, mild epigastric tenderness   Assessment/Plan:  Principal Problem:   Intractable nausea and vomiting Active Problems:   Type 2 diabetes mellitus (HCC)   Essential hypertension   Stage 3 chronic kidney disease   Protein-calorie malnutrition, severe   Diarrhea  Toni Hill is a very pleasant 74 yo female with PMH of type 2 diabetes mellitus, HTN, CKD stage IIIb who was admitted to the hospital for 2 weeks of intractable N/V and epigastric pain.   Intractable nausea and vomiting due to medication side effect (Ozempic) I had an extensive conversation with the daughter who tells me that since her Ozempic was started she has been experiencing abdominal pain, nausea, vomiting and weight loss.  It looks as Ozempic was started in February of this year and increased recently in May. - Continue supportive care and IV Lactates Ringer - Trial of Reglan 5 mg 3 times  daily with meals.   - Reasonable to pursue gastroparesis work-up with gastric emptying study and outpatient  - Advance to soft diet to see if patient can tolerate. Supplement with Pro-Stat - Will discharge today on Lantus 15 units daily and titrate 2 units nightly with goal blood glucose between 140 and 180.  We will have her follow-up in our clinic next week   Diarrhea (DDx gastroenteritis versus malabsorption versus sequelae of excess Synthroid usage)-resolved She had no recorded stool output yesterday. - Stool sample PCR pending. - Continue hydrate with IVF   Type 2 diabetes mellitus - Continue semaglutide  - Refused SSI   Orthostatic hypotension - Continue hydration with IVF - Follow-up repeat orthostatic vitals today - Continue holding home BP medof amlodipine   Hypothyroidism - Low TSH and elevated T4.  - Reduce the dosage of Synthroidto 100 mcg daily according to patient's weight loss - Recheck TSH in 6 weeks   AKI onCKD3b - Creatine is at baseline - Continue monitor renal function    Anxiety - Continue Zoloft100 mg daily   FEN: Soft diet, status post LR IVF resuscitation VTE ppx: Lovenox CODE STATUS: Full code  Prior to Admission Living Arrangement: Home Anticipated Discharge Location: Home Barriers to Discharge: Ongoing medical work-up, IV fluid resuscitation Dispo: Anticipated discharge in approximately 1 day(s).    Toni Rosenthal, MD 01/05/2020, 5:59 AM Pager: [phone] Internal Medicine  Teaching Service After 5pm on weekdays and 1pm on weekends: On Call pager: [phone]

## 2020-01-05 NOTE — Discharge Summary (Addendum)
Name: Toni Hill MRN: [id number] DOB: December 21, 1945 74 y.o. PCP: Maudie Mercury, MD  Date of Admission: 01/01/2020  4:52 PM Date of Discharge: 01/05/2020 Attending Physician: Dr. Rebeca Alert  Discharge Diagnosis: 1.  Intractable nausea, vomiting, diarrhea due to medication side effect (Ozempic) 2.  Orthostatic hypotension 3.  Type 2 diabetes mellitus 4.  Hypothyroidism 5.  Acute on chronic CKD stage IIIb   Discharge Medications: Allergies as of 01/05/2020       Reactions   Bactrim [sulfamethoxazole-trimethoprim]    Patient experienced nephrotoxicity in the setting of CKD. Strongly recommend against using this antibiotic in the future.    Cephalexin Itching   Morphine And Related Nausea And Vomiting   Vioxx [rofecoxib]    GI bleeding   Penicillins Hives, Itching   Has patient had a PCN reaction causing immediate rash, facial/tongue/throat swelling, SOB or lightheadedness with hypotension: No Has patient had a PCN reaction causing severe rash involving mucus membranes or skin necrosis: No Has patient had a PCN reaction that required hospitalization: no Has patient had a PCN reaction occurring within the last 10 years: No If all of the above answers are "NO", then may proceed with Cephalosporin use.        Medication List     STOP taking these medications    Ozempic (1 MG/DOSE) 2 MG/1.5ML Sopn Generic drug: Semaglutide (1 MG/DOSE)       TAKE these medications    amLODipine 5 MG tablet Commonly known as: NORVASC Take 5 mg by mouth daily.   baclofen 10 MG tablet Commonly known as: LIORESAL TAKE 0.5-1 TABLETS (5-10 MG TOTAL) BY MOUTH 3 (THREE) TIMES DAILY AS NEEDED FOR MUSCLE SPASMS. What changed:  how much to take when to take this   Calcium 600/Vitamin D3 600-800 MG-UNIT Tabs Generic drug: Calcium Carb-Cholecalciferol Take 1 tablet by mouth daily.   EQL Vitamin D3 50 MCG (2000 UT) Caps Generic drug: Cholecalciferol Take 2,000 Units by mouth daily.     ezetimibe 10 MG tablet Commonly known as: ZETIA Take 1 tablet (10 mg total) by mouth every evening.   gabapentin 300 MG capsule Commonly known as: Neurontin Take 1 capsule (300 mg total) by mouth daily as needed (pain and neuropathy). What changed: when to take this   hydrocortisone 2.5 % cream Apply 1 application topically 2 (two) times daily.   insulin glargine 100 UNIT/ML Solostar Pen Commonly known as: LANTUS Inject 15 Units into the skin daily.   ondansetron 4 MG tablet Commonly known as: ZOFRAN Take 1 tablet (4 mg total) by mouth 2 (two) times daily.   pantoprazole 40 MG tablet Commonly known as: PROTONIX Take 1 tablet (40 mg total) by mouth daily.   sertraline 100 MG tablet Commonly known as: ZOLOFT TAKE 1 TABLET BY MOUTH EVERY DAY What changed: when to take this   simvastatin 40 MG tablet Commonly known as: ZOCOR Take 1 tablet (40 mg total) by mouth every evening.   vitamin B-12 500 MCG tablet Commonly known as: CYANOCOBALAMIN Take 1,000 mcg by mouth in the morning and at bedtime.        Disposition and follow-up:   Toni Hill was discharged from [facility] in Stable condition.  At the hospital follow up visit please address:  1.  Intractable nausea, vomiting, abdominal pain and diarrhea secondary to medication side effect (Ozempic): She was transitioned to Lantus 15 units daily. Advised to increase 3U nightly with goal BG 140-180.  Reasonable to pursue gastroparesis  work-up with gastric emptying study  B: Hypothyroidism: TSH decreased at 0.226, free T4 increased at 1.3.  Synthroid dose decreased from 125 mcg 100 mcg daily  2.  Labs / imaging needed at time of follow-up: BMP, TSH in 6 weeks  3.  Pending labs/ test needing follow-up: None  Follow-up Appointments:    Hospital Course by problem list: 1.  Intractable nausea, vomiting, diarrhea due to medication side effect (Ozempic): Toni Hill is a 74 year old woman with medical  history significant for type 2 diabetes mellitus, hypertension, CKD stage IIIb, hypothyroidism who presented to the hospital with a 2-week complaint of intractable nausea, vomiting, epigastric pain and diarrhea.  She received supportive treatment with antiemetics, IV fluids and had improvement in her symptoms.  She was previously on Basaglar however was started on Ozempic in February this year.  Upon speaking to the patient and her daughter, she began experiencing abdominal discomfort, weight loss after taking her medication.  Ozempic dose was recently increased in May and her symptoms worsened.  At discharge, Ozempic was discontinued and was asked to start Lantus 15 units nightly and monitor her blood glucose with goal 140-180.  She was instructed to increase Lantus dose by 3 units nightly with elevated blood glucose.  2.  Orthostatic hypotension: Resolved with IV fluids  3.  Type 2 diabetes mellitus  4.  Hypothyroidism: TSH decreased at 0.226, free T4 increased at 1.3.  Synthroid dose decreased from 125 mcg to 100 mcg daily  5.  Acute on chronic CKD stage IIIb: Improved with IV fluids.  Serum creatinine 1.1 at discharge  Discharge Vitals:   BP (!) 159/78 (BP Location: Right Arm)   Pulse 85   Temp 98.1 F (36.7 C)   Resp 18   Ht 5\' 9"  (1.753 m)   Wt 73.5 kg   SpO2 97%   BMI 23.93 kg/m   Discharge Instructions: Discharge Instructions     Diet - low sodium heart healthy   Complete by: As directed    Discharge instructions   Complete by: As directed    Toni Hill,  It was a pleasure taking care of you in the hospital.  You were admitted because of abdominal pain, nausea, vomiting, diarrhea.  We believe this was due to side effects of your Ozempic.  I will have you discontinue this medication and start taking Lantus  1.  Take Lantus 15 units at night.  I want you to check your blood glucose with the continuous reader about 4 times a day.  My goal for you is blood glucose between 140  and 180.  If it stays above 180, I would like for you to increase your Lantus by 3 units daily  2.  You should receive a call from our clinic to see [place] next week.  Take care!   Increase activity slowly   Complete by: As directed        Signed: Jean Rosenthal, MD 01/06/2020, 12:48 PM   Pager: [phone] Internal Medicine Teaching Service

## 2020-01-06 ENCOUNTER — Other Ambulatory Visit: Payer: Self-pay | Admitting: Internal Medicine

## 2020-01-06 ENCOUNTER — Telehealth: Payer: Self-pay | Admitting: Internal Medicine

## 2020-01-06 DIAGNOSIS — E1140 Type 2 diabetes mellitus with diabetic neuropathy, unspecified: Secondary | ICD-10-CM

## 2020-01-06 MED ORDER — LEVOTHYROXINE SODIUM 100 MCG PO TABS
100.0000 ug | ORAL_TABLET | Freq: Every day | ORAL | 2 refills | Status: DC
Start: 2020-01-06 — End: 2020-06-17

## 2020-01-06 MED ORDER — INSULIN DETEMIR 100 UNIT/ML ~~LOC~~ SOLN: SUBCUTANEOUS | 1 refills | Status: DC

## 2020-01-06 MED ORDER — INSULIN DETEMIR 100 UNIT/ML FLEXPEN: 15.0000 [IU] | Freq: Every day | SUBCUTANEOUS | 2 refills | Status: DC

## 2020-01-06 NOTE — Telephone Encounter (Signed)
Contacted by [facility] pharmacist.  Insurance does not cover Lantus, it does cover Levemir, will send in new prescription for this. Indicated to take 15 units nightly and then increase by 3 units until goal of 140-160.   Patient was also wondering about synthroid dose, appears to be decreased to 100 mcg daily however new prescription not sent in. Will send in new prescription for synthroid 100 mcg daily.

## 2020-01-08 ENCOUNTER — Telehealth: Payer: Self-pay | Admitting: Internal Medicine

## 2020-01-08 NOTE — Telephone Encounter (Signed)
TOC APPT FOR HFU [facility] FOR 01/10/2020 @ 3:15PM WITH DR COE.

## 2020-01-09 ENCOUNTER — Encounter: Payer: Self-pay | Admitting: Internal Medicine

## 2020-01-09 ENCOUNTER — Other Ambulatory Visit: Payer: Self-pay | Admitting: Internal Medicine

## 2020-01-09 ENCOUNTER — Encounter: Payer: Medicare Other | Admitting: Internal Medicine

## 2020-01-09 NOTE — Telephone Encounter (Signed)
Transition Care Management Follow-up Telephone Call  Date of discharge and from where: July 3,2021. Discharged home.  How have you been since you were released from the hospital? Pt stated not good.  Any questions or concerns? Yes, pt stated she was feeling ok until today; c/o n/v and dizziness. I asked if she has taken Protonix; stated it was filled day before yesterday. Only eating rice. Also stated she was not completely happy with her hospital stay and she had called in a complaint.  Items Reviewed:  Did the pt receive and understand the discharge instructions provided? No stated she did not received; it was on My Chart.  Medications obtained and verified? No   Any new allergies since your discharge? No   Dietary orders reviewed? No  Do you have support at home? Yes, she lives alone but her daughter lives "down the road" and her husband (who does not lives w/her) checks on her.  Functional Questionnaire: (I = Independent and D = Dependent) ADLs: Independently. She uses a walker.  Bathing/Dressing- Independently - stated it takes her a long time.  Meal Prep- Stated her daughter helps.  Eating- Independently.  Maintaining continence- Independently.  Transferring/Ambulation- Independenttly with a walker.  Managing Meds- Independently.  Follow up appointments reviewed:   PCP Hospital f/u appt confirmed? Yes  Scheduled to see on 01/10/20 @ 1515 PM.   Are transportation arrangements needed? No stated her daughter will bring her.   If their condition worsens, is the pt aware to call PCP or go to the Emergency Dept.? Yes  Was the patient provided with contact information for the PCP's office or ED? Yes Was to pt encouraged to call back with questions or concerns? Yes.

## 2020-01-10 ENCOUNTER — Encounter: Payer: Self-pay | Admitting: Internal Medicine

## 2020-01-10 ENCOUNTER — Ambulatory Visit (INDEPENDENT_AMBULATORY_CARE_PROVIDER_SITE_OTHER): Payer: Medicare Other | Admitting: Internal Medicine

## 2020-01-10 VITALS — BP 113/63 | HR 100 | Temp 99.0°F | Ht 69.0 in | Wt 180.6 lb

## 2020-01-10 DIAGNOSIS — E039 Hypothyroidism, unspecified: Secondary | ICD-10-CM

## 2020-01-10 DIAGNOSIS — E1140 Type 2 diabetes mellitus with diabetic neuropathy, unspecified: Secondary | ICD-10-CM | POA: Diagnosis present

## 2020-01-10 DIAGNOSIS — N179 Acute kidney failure, unspecified: Secondary | ICD-10-CM

## 2020-01-10 DIAGNOSIS — Z794 Long term (current) use of insulin: Secondary | ICD-10-CM | POA: Diagnosis not present

## 2020-01-10 DIAGNOSIS — F319 Bipolar disorder, unspecified: Secondary | ICD-10-CM

## 2020-01-10 DIAGNOSIS — R112 Nausea with vomiting, unspecified: Secondary | ICD-10-CM | POA: Diagnosis not present

## 2020-01-10 LAB — GLUCOSE, CAPILLARY: Glucose-Capillary: 185 mg/dL — ABNORMAL HIGH (ref 70–99)

## 2020-01-10 NOTE — Progress Notes (Signed)
CC: Hospital follow up for Intractable nausea and vomiting   HPI:  [name] is a 74 y.o. female with a past medical history stated below and presents today for hospital follow up for intractable nausea and vomiting. Please see problem based assessment and plan for additional details.  Past Medical History:  Diagnosis Date  . Bilateral lower extremity edema    right > left  . Bipolar 1 disorder ()   . CKD (chronic kidney disease) stage 3, GFR 30-59 ml/min     Dr Lawson Radar, [facility]  . Diabetes, polyneuropathy ([place])    FEET AND FINGER TIPS  . Fibromyalgia   . GERD (gastroesophageal reflux disease)   . History of chronic gastritis   . Hyperlipidemia   . Hypertension    per pt was take off bp medication because of kidney disease told by her nephrologist  . Hypothyroidism   . Mixed incontinence urge and stress   . OA (osteoarthritis)    KNEES  . OAB (overactive bladder)   . PONV (postoperative nausea and vomiting)   . 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 Type 2 diabetes mellitus treated with insulin ([place])   . Wears glasses     Current Outpatient Medications on File Prior to Visit  Medication Sig Dispense Refill  . pantoprazole (PROTONIX) 40 MG tablet TAKE 1 TABLET BY MOUTH EVERY DAY 90 tablet 1  . amLODipine (NORVASC) 5 MG tablet Take 5 mg by mouth daily.    . baclofen (LIORESAL) 10 MG tablet TAKE 0.5-1 TABLETS (5-10 MG TOTAL) BY MOUTH 3 (THREE) TIMES DAILY AS NEEDED FOR MUSCLE SPASMS. (Patient taking differently: Take 10 mg by mouth 2 (two) times daily. ) 120 tablet 3  . Calcium Carb-Cholecalciferol (CALCIUM 600/VITAMIN D3) 600-800 MG-UNIT TABS Take 1 tablet by mouth daily.     . Cholecalciferol (EQL VITAMIN D3) 50 MCG (2000 UT) CAPS Take 2,000 Units by mouth daily.     Marland Kitchen ezetimibe (ZETIA) 10 MG tablet Take 1 tablet (10 mg total) by mouth every evening. 90 tablet 1  . gabapentin (NEURONTIN) 300 MG capsule Take 1 capsule (300 mg total) by mouth  daily as needed (pain and neuropathy). (Patient taking differently: Take 300 mg by mouth every evening. ) 30 capsule 2  . hydrocortisone 2.5 % cream Apply 1 application topically 2 (two) times daily.     . insulin detemir (LEVEMIR) 100 unit/ml SOLN Inject 0.15 mLs (15 Units total) into the skin daily. 15 units nightly, increase by 3 units nightly until your CBGs are between 140-180. 4.5 mL 2  . levothyroxine (SYNTHROID) 100 MCG tablet Take 1 tablet (100 mcg total) by mouth daily. 30 tablet 2  . ondansetron (ZOFRAN) 4 MG tablet Take 1 tablet (4 mg total) by mouth 2 (two) times daily. 180 tablet 3  . sertraline (ZOLOFT) 100 MG tablet TAKE 1 TABLET BY MOUTH EVERY DAY (Patient taking differently: Take 100 mg by mouth in the morning. ) 90 tablet 1  . simvastatin (ZOCOR) 40 MG tablet Take 1 tablet (40 mg total) by mouth every evening. 90 tablet 3  . vitamin B-12 (CYANOCOBALAMIN) 500 MCG tablet Take 1,000 mcg by mouth in the morning and at bedtime.      No current facility-administered medications on file prior to visit.    Family History  Problem Relation Age of Onset  . Breast cancer Mother 81  . Cancer Father        ?  type  . Breast cancer Sister 64  . Breast cancer Maternal Grandmother        ? age    Social History   Socioeconomic History  . Marital status: Married    Spouse name: Not on file  . Number of children: Not on file  . Years of education: Not on file  . Highest education level: Not on file  Occupational History  . Not on file  Tobacco Use  . Smoking status: Never Smoker  . Smokeless tobacco: Never Used  Vaping Use  . Vaping Use: Never used  Substance and Sexual Activity  . Alcohol use: No  . Drug use: No  . Sexual activity: Not Currently  Other Topics Concern  . Not on file  Social History Narrative  . Not on file   Social Determinants of Health   Financial Resource Strain:   . Difficulty of Paying Living Expenses:   Food Insecurity:   . Worried About  Charity fundraiser in the Last Year:   . Arboriculturist in the Last Year:   Transportation Needs:   . Film/video editor (Medical):   Marland Kitchen Lack of Transportation (Non-Medical):   Physical Activity:   . Days of Exercise per Week:   . Minutes of Exercise per Session:   Stress:   . Feeling of Stress :   Social Connections:   . Frequency of Communication with Friends and Family:   . Frequency of Social Gatherings with Friends and Family:   . Attends Religious Services:   . Active Member of Clubs or Organizations:   . Attends Archivist Meetings:   Marland Kitchen Marital Status:   Intimate Partner Violence:   . Fear of Current or Ex-Partner:   . Emotionally Abused:   Marland Kitchen Physically Abused:   . Sexually Abused:     Review of Systems: ROS negative except for what is noted on the assessment and plan.  Vitals:   01/10/20 1514  BP: 113/63  Pulse: 100  Temp: 99 F (37.2 C)  TempSrc: Oral  SpO2: 95%  Weight: 180 lb 9.6 oz (81.9 kg)  Height: 5\' 9"  (1.753 m)    Patient states that she has not been feeling well .  Threw up yesterday. Eating every day.   DM  - problem with insulin, accidentally took double yesterday evening - cantaloupe and cherry is the only thing she can eat.  - add ozxempic to allergy list/ GLP-1    Physical Exam: Physical Exam Constitutional:      Appearance: Normal appearance.  HENT:     Head: Normocephalic and atraumatic.     Mouth/Throat:     Mouth: Mucous membranes are moist.  Eyes:     Extraocular Movements: Extraocular movements intact.  Cardiovascular:     Rate and Rhythm: Normal rate.     Pulses: Normal pulses.  Pulmonary:     Effort: Pulmonary effort is normal.     Breath sounds: Normal breath sounds.  Abdominal:     General: Abdomen is flat. Bowel sounds are normal. There is no distension.  Skin:    General: Skin is warm and dry.  Neurological:     General: No focal deficit present.     Mental Status: She is alert and oriented to  person, place, and time.  Psychiatric:        Mood and Affect: Mood is anxious. Affect is labile.        Judgment: Judgment is impulsive.  Assessment & Plan:   See Encounters Tab for problem based charting.  Patient discussed with Dr. Karie Schwalbe, D.O. [facility], PGY-2 Pager: [phone], Phone: [phone] Date 01/10/2020 Time 3:22 PM

## 2020-01-10 NOTE — Patient Instructions (Addendum)
Thank you, [name] for allowing [place] to provide your care today. Today we discussed hospital follow up for nausea and vomitting.    I have ordered the following labs for you:   Lab Orders     Glucose, capillary     BMP8+Anion Gap   I will call if any are abnormal. All of your labs can be accessed through "My Chart".   I have ordered the following medication/changed the following medications:  - Please continue current medications. - Please advance diet slowly as tolerated  - Please use over the counter medication such a Mira-lax for constipation.  Please follow-up in 1 months  Should you have any questions or concerns please call the internal medicine clinic at [phone].    Marianna Payment, D.O. [facility]   My Chart Access: [email or web address]   If you have not already done so, please get your COVID 19 vaccine  To schedule an appointment for a COVID vaccine choice any of the following: Go to [email or web address]   Go to [email or web address]                  Call [phone]                                     Call [phone] and select Option 2

## 2020-01-11 ENCOUNTER — Encounter: Payer: Self-pay | Admitting: Internal Medicine

## 2020-01-11 LAB — BMP8+ANION GAP
Anion Gap: 11 mmol/L (ref 10.0–18.0)
BUN/Creatinine Ratio: 15 (ref 12–28)
BUN: 22 mg/dL (ref 8–27)
CO2: 27 mmol/L (ref 20–29)
Calcium: 9.6 mg/dL (ref 8.7–10.3)
Chloride: 103 mmol/L (ref 96–106)
Creatinine, Ser: 1.45 mg/dL — ABNORMAL HIGH (ref 0.57–1.00)
GFR calc Af Amer: 41 mL/min/{1.73_m2} — ABNORMAL LOW (ref >59)
GFR calc non Af Amer: 36 mL/min/{1.73_m2} — ABNORMAL LOW (ref >59)
Glucose: 201 mg/dL — ABNORMAL HIGH (ref 65–99)
Potassium: 4.8 mmol/L (ref 3.5–5.2)
Sodium: 141 mmol/L (ref 134–144)

## 2020-01-11 MED ORDER — INSULIN DETEMIR 100 UNIT/ML FLEXPEN: 15.0000 [IU] | Freq: Every day | SUBCUTANEOUS | 2 refills | Status: DC

## 2020-01-11 NOTE — Progress Notes (Signed)
Internal Medicine Clinic Attending  Case discussed with Dr. Coe  At the time of the visit.  We reviewed the resident's history and exam and pertinent patient test results.  I agree with the assessment, diagnosis, and plan of care documented in the resident's note.  

## 2020-01-11 NOTE — Assessment & Plan Note (Addendum)
Patient was recently discharged form [facility] for nausea and vomiting with concern for adverse reaction to ozempic. This was discontinued and was started on 15 units of Levimir nightly, which she prefers taking. Her blood glucose levels have been between 140-160 since discharge. She has had one episode of low blood sugar in the 50s. She states that  was secondary to insulin pen malfunction. Butch Penny Pyler spoke to her during this office visit and showed her how to perform a test on the pen to make sure it is working appropriately. Patient admits to understanding    Plan: - Continue levemir 15 units q HS - Follow up with PCP

## 2020-01-11 NOTE — Assessment & Plan Note (Signed)
Patient presents for a hospital follow up for nausea and vomiting. She states that her symptoms have improved overall, but does admit to a single episode of emesis yesterday. She believes that she can only eat cantaloupe and cherries or she will become nauseated. I reassured her that her symptoms are improving and to advance her diet slowly as tolerated. She has minimal nausea, no abdominal pain, or diarrhea.   Plan: - Patient has a two month supply of Zofran already prescribed to her. Refill if needed.  - Follow up as needed.

## 2020-01-11 NOTE — Assessment & Plan Note (Signed)
Patient presented for hospital follow up for intractable nausea/vomiting with prerenal AKI that showed improvement with conservative management in the hospital. BMP drawn today to shows worsening kidney function form 1.12 to 1.44. Looking back at her medical history her baseline Cr is between 1.3-1.4. Recent CT scan of abdomen shows bilateral renal cysts and stable right renal calcifications. Based on her history of DM and HTN, she likely has some underlying medical renal disease/CKD. I will set her up for a 1 month follow up and reassess her kidney function at that time.  Plan: - Follow up in 1 month

## 2020-01-11 NOTE — Assessment & Plan Note (Signed)
Patient appears anxious with impulsivity and flight of ideas. She is redirectable and was able to calm down during the interview, but easily aggravated, Patient recently had her Synthroid dose decrease and will need a 6 month follow up to recheck this level. If her symptoms don't improve with the dosage change, then she may need psychiatric evaluation for further medication management of her Bipolar disorder.   Plan: - Continue Sertraline 100 mg  - Follow up in 6 weeks from dosage adjustment (roughlyl 1 month away) for TSH check - Consider referral to [facility] for pysch referral and medication management.  -

## 2020-01-11 NOTE — Assessment & Plan Note (Signed)
Will need TSH check in 6 weeks form dosage adjustment.    Plan: 1 month follow up for TSH check.

## 2020-01-24 ENCOUNTER — Other Ambulatory Visit: Payer: Self-pay | Admitting: Dietician

## 2020-01-24 ENCOUNTER — Telehealth: Payer: Self-pay | Admitting: Dietician

## 2020-01-24 DIAGNOSIS — G6289 Other specified polyneuropathies: Secondary | ICD-10-CM

## 2020-01-24 DIAGNOSIS — M5440 Lumbago with sciatica, unspecified side: Secondary | ICD-10-CM

## 2020-01-24 NOTE — Telephone Encounter (Signed)
Please have her follow up with [place] tomorrow and hold or take only 5 units of her levemir tonight

## 2020-01-24 NOTE — Telephone Encounter (Signed)
She says she cannot come in tomorrow. She verbalized understanding to decreasing her insulin. Blood sugar was 177 with her meter. Recommended she put her next sensor on her arm instead of her abdomen. Having a hard time walking. She'll call us tomorrow.

## 2020-01-24 NOTE — Telephone Encounter (Signed)
I agree.You can add her to my schedule as this should not take long to figure out.

## 2020-01-24 NOTE — Telephone Encounter (Signed)
Toni Hill left a message that she "has questions about her new insulin because her blood sugars are too low". She called back and states that she "Takes (15 units levemir until this past Tuesday when she lowered her dose to 10 units)  insulin around 10 pm at night. She has not taken any today and is afraid to take more than 5 units. Blood sugars reported from her CGM are:  Today 66/95/82 right now July 18th Low/47/48/97/157 July 14th  55/110/207/220/236 July 11   60/72/95/148/146/136 July 8 50 upon waking 51/97/104/93  She denies symptoms other than "Heart was beating very fast one night." asked her to check her blood sugar with her meter to verify lows. Told her I would route her concerns to the doctors. Please advise what to tell her

## 2020-01-24 NOTE — Telephone Encounter (Signed)
Toni Hill asks for a 90 day refill of her gabapentin. She ran out yesterday

## 2020-01-25 ENCOUNTER — Other Ambulatory Visit: Payer: Self-pay | Admitting: Internal Medicine

## 2020-01-25 ENCOUNTER — Telehealth: Payer: Self-pay | Admitting: Internal Medicine

## 2020-01-25 DIAGNOSIS — Z794 Long term (current) use of insulin: Secondary | ICD-10-CM

## 2020-01-25 DIAGNOSIS — E1140 Type 2 diabetes mellitus with diabetic neuropathy, unspecified: Secondary | ICD-10-CM

## 2020-01-25 MED ORDER — GABAPENTIN 300 MG PO CAPS: 300.0000 mg | ORAL_CAPSULE | Freq: Every day | ORAL | 2 refills | Status: DC | PRN

## 2020-01-25 NOTE — Telephone Encounter (Signed)
Pt seen Recently by Dr. Marianna Payment on 01/10/2020.  Pt is requesting a Referral back to her Endocrinologist Dr. Laury Axon with [facility] for her Diabetes.  Pt states she is overdue and their office is Requiring a Referral to get her sch as soon as possible.  Please advise.

## 2020-01-25 NOTE — Telephone Encounter (Signed)
I put in the referral now.

## 2020-01-29 ENCOUNTER — Ambulatory Visit (INDEPENDENT_AMBULATORY_CARE_PROVIDER_SITE_OTHER): Payer: Medicare Other | Admitting: Orthopaedic Surgery

## 2020-01-29 ENCOUNTER — Ambulatory Visit (INDEPENDENT_AMBULATORY_CARE_PROVIDER_SITE_OTHER): Payer: Medicare Other

## 2020-01-29 DIAGNOSIS — Z96652 Presence of left artificial knee joint: Secondary | ICD-10-CM | POA: Diagnosis not present

## 2020-01-29 DIAGNOSIS — T8484XA Pain due to internal orthopedic prosthetic devices, implants and grafts, initial encounter: Secondary | ICD-10-CM

## 2020-01-29 NOTE — Progress Notes (Signed)
Office Visit Note   Patient: Toni Hill           Date of Birth: July 17, 1945           MRN: [id number] Visit Date: 01/29/2020              Requested by: Maudie Mercury, MD [address] [place],  [place] [address] PCP: Maudie Mercury, MD   Assessment & Plan: Visit Diagnoses:  1. Painful orthopaedic hardware ([place])   2. Status post total left knee replacement     Plan: Impression is painful orthopedic hardware of the right ankle and status post left total knee replacement.  For the hardware she would like to have this removed.  We will schedule surgery in the near future.  For the left knee replacement she has had issues with fatigue and lack of physical therapy.  I think she is significantly deconditioned which is causing her knee pain.  She needs to continue with physical therapy and home exercise.  For work-up of her chronic fatigue.  We look forward to treating her in the operating room.  Follow-Up Instructions: Return if symptoms worsen or fail to improve.   Orders:  Orders Placed This Encounter  Procedures  . XR Ankle Complete Right   No orders of the defined types were placed in this encounter.     Procedures: No procedures performed   Clinical Data: No additional findings.   Subjective: Chief Complaint  Patient presents with  . Left Knee - Pain  . Right Ankle - Pain    Toni Hill is coming in today for painful right ankle hardware and left total knee replacement.   Review of Systems  Constitutional: Negative.   HENT: Negative.   Eyes: Negative.   Respiratory: Negative.   Cardiovascular: Negative.   Endocrine: Negative.   Musculoskeletal: Negative.   Neurological: Negative.   Hematological: Negative.   Psychiatric/Behavioral: Negative.   All other systems reviewed and are negative.    Objective: Vital Signs: There were no vitals taken for this visit.  Physical Exam Vitals and nursing note reviewed.  Constitutional:      Appearance:  She is well-developed.  Pulmonary:     Effort: Pulmonary effort is normal.  Skin:    General: Skin is warm.     Capillary Refill: Capillary refill takes less than 2 seconds.  Neurological:     Mental Status: She is alert and oriented to person, place, and time.  Psychiatric:        Behavior: Behavior normal.        Thought Content: Thought content normal.        Judgment: Judgment normal.     Ortho Exam Right ankle shows prominent screws on the medial lateral and anterior surfaces.  These are quite superficial and tender to touch.  Left knee shows a fully healed surgical scar.  Good range of motion without pain.  No joint effusion. Specialty Comments:  No specialty comments available.  Imaging: XR Ankle Complete Right  Result Date: 01/29/2020 Intact tibial nail with distal interlocking screws.    PMFS History: Patient Active Problem List   Diagnosis Date Noted  . Painful orthopaedic hardware ([place]) 01/29/2020  . Protein-calorie malnutrition, severe 01/04/2020  . Diarrhea 01/04/2020  . Intractable nausea and vomiting 01/02/2020  . Nausea & vomiting 01/01/2020  . Preoperative evaluation to rule out surgical contraindication 11/19/2019  . Bilateral low back pain with sciatica 09/06/2019  . Peripheral neuropathy 09/06/2019  .  Seborrheic keratoses 08/20/2019  . Left breast mass 08/20/2019  . History of left knee replacement 05/29/2019  . Macrocytic anemia 03/08/2019  . UTI (urinary tract infection) 03/04/2019  . Pain of left calf 01/26/2019  . Acute kidney injury ([place]) 01/25/2019  . Leg swelling 01/25/2019  . QT prolongation 01/25/2019  . Elevated d-dimer 01/23/2019  . Acute blood loss as cause of postoperative anemia 01/23/2019  . Cellulitis of leg, left 01/23/2019  . At risk for adverse drug event 01/22/2019  . Bipolar 1 disorder ([place])   . 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 Osteoarthritis of left knee 12/30/2018  . Anxiety 12/30/2018  . Stage 3  chronic kidney disease 06/28/2016  . Hypercholesterolemia 08/20/2015  . Type 2 diabetes mellitus ([place]) 02/28/2014  . Esophageal reflux 02/28/2014  . Stress incontinence, female 02/28/2014  . Hypothyroidism (acquired) 02/28/2014  . Essential hypertension 11/17/2011   Past Medical History:  Diagnosis Date  . Bilateral lower extremity edema    right > left  . Bipolar 1 disorder ([place])   . CKD (chronic kidney disease) stage 3, GFR 30-59 ml/min     Dr Lawson Radar, [facility]  . Diabetes, polyneuropathy ([place])    FEET AND FINGER TIPS  . Fibromyalgia   . GERD (gastroesophageal reflux disease)   . History of chronic gastritis   . Hyperlipidemia   . Hypertension    per pt was take off bp medication because of kidney disease told by her nephrologist  . Hypothyroidism   . Mixed incontinence urge and stress   . OA (osteoarthritis)    KNEES  . OAB (overactive bladder)   . PONV (postoperative nausea and vomiting)   . 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 Type 2 diabetes mellitus treated with insulin ([place])   . Wears glasses     Family History  Problem Relation Age of Onset  . Breast cancer Mother 23  . Cancer Father        ? type  . Breast cancer Sister 50  . Breast cancer Maternal Grandmother        ? age    Past Surgical History:  Procedure Laterality Date  . CHOLECYSTECTOMY OPEN  1990's  . COLONOSCOPY  last one 07-17-2014  . CYSTOSCOPY WITH INJECTION N/A 10/29/2016   Procedure: CYSTOSCOPY WITH INJECTION BOTOX 100 UNITS, urethral dilation;  Surgeon: Carolan Clines, MD;  Location: [facility];  Service: Urology;  Laterality: N/A;  . ESOPHAGOGASTRODUODENOSCOPY  last one 07-01-2016  . EXCISIONAL BREAST BX  1990   BENIGN  . ORIF TIBIA & FIBULA FRACTURES  2010   retained rod  . TONSILLECTOMY  age 44  . TOTAL KNEE ARTHROPLASTY Left 01/15/2019   Procedure: LEFT TOTAL KNEE ARTHROPLASTY;  Surgeon: Leandrew Koyanagi, MD;  Location: [facility];  Service:  Orthopedics;  Laterality: Left;  Marland Kitchen VAGINAL HYSTERECTOMY  1979  . VENTRAL HERNIA REPAIR  1990's   Social History   Occupational History  . Not on file  Tobacco Use  . Smoking status: Never Smoker  . Smokeless tobacco: Never Used  Vaping Use  . Vaping Use: Never used  Substance and Sexual Activity  . Alcohol use: No  . Drug use: No  . Sexual activity: Not Currently

## 2020-02-15 ENCOUNTER — Telehealth: Payer: Self-pay | Admitting: Orthopaedic Surgery

## 2020-02-15 NOTE — Telephone Encounter (Signed)
I called and sw pt and she wants to see Dr. Erlinda Hong appt made for Tuesday at 1:30

## 2020-02-15 NOTE — Telephone Encounter (Signed)
Patient called requesting a call back. Patient states all toes are curling under and straighten toes out. Patient also states Dr. Erlinda Hong is suppose to remove screws. Please call patient [phone].

## 2020-02-19 ENCOUNTER — Ambulatory Visit: Payer: Medicare Other | Admitting: Orthopaedic Surgery

## 2020-02-20 ENCOUNTER — Other Ambulatory Visit: Payer: Self-pay | Admitting: Internal Medicine

## 2020-02-20 MED ORDER — BD PEN NEEDLE SHORT U/F 31G X 8 MM MISC: 1.0000 | Freq: Every day | 2 refills | Status: DC

## 2020-02-20 NOTE — Telephone Encounter (Signed)
Pen needles no longer on current med list. Returned call to patient. Requesting BD INSULIN PEN NEEDLE UF SHORT) 31 X 5/16." states she is completely out and was unable to take a shot last night. Hubbard Hartshorn, BSN, RN-BC

## 2020-02-20 NOTE — Telephone Encounter (Signed)
Need refill on needles ;pt contact [phone]   Needs them ASAP  [facility] [place]  [phone]

## 2020-02-21 ENCOUNTER — Telehealth: Payer: Self-pay | Admitting: Dietician

## 2020-02-21 NOTE — Telephone Encounter (Signed)
Patient says the endocrinologist office Dr. Iran Planas, did not receive a referral. Asks [place] to resend the referral

## 2020-02-22 ENCOUNTER — Other Ambulatory Visit: Payer: Self-pay

## 2020-02-22 NOTE — Telephone Encounter (Signed)
Per Notes Message Below this was already Completed on the Following: Please see the Notes:  - Message ----- From: Megan Salon Sent: 01/29/2020  11:23 AM EDT To: Zola Button Subject: RE: Jane FAXED (ROI# [id number] ) ENDOCRINOLOGY -DR Irven Easterly. FOR REVIEW / [place]. [phone].  Office has been Called again Per Lela who spoke with "Deana" who confirmed this was the correct to send again.  Records faxed again today.

## 2020-02-26 ENCOUNTER — Ambulatory Visit (INDEPENDENT_AMBULATORY_CARE_PROVIDER_SITE_OTHER): Payer: Medicare Other | Admitting: Orthopaedic Surgery

## 2020-02-26 ENCOUNTER — Ambulatory Visit (INDEPENDENT_AMBULATORY_CARE_PROVIDER_SITE_OTHER): Payer: Medicare Other

## 2020-02-26 ENCOUNTER — Encounter: Payer: Self-pay | Admitting: Orthopaedic Surgery

## 2020-02-26 DIAGNOSIS — Z96652 Presence of left artificial knee joint: Secondary | ICD-10-CM | POA: Diagnosis not present

## 2020-02-26 MED ORDER — TIZANIDINE HCL 4 MG PO TABS: 4.0000 mg | ORAL_TABLET | Freq: Two times a day (BID) | ORAL | 0 refills | Status: DC | PRN

## 2020-02-26 NOTE — Progress Notes (Signed)
Office Visit Note   Patient: Toni Hill           Date of Birth: 12-15-1945           MRN: [id number] Visit Date: 02/26/2020              Requested by: Maudie Mercury, MD [address],  [place] [address] PCP: Maudie Mercury, MD   Assessment & Plan: Visit Diagnoses:  1. History of left knee replacement     Plan: Impression is status post left total knee replacement and total body deconditioning.  I really recommended that she start physical therapy.  She is scheduled for right lower extremity hardware removal next week and states that she will start physical therapy after this surgery.  I will call in a muscle relaxer for her toe to see if this helps.  Follow-Up Instructions: Return if symptoms worsen or fail to improve.   Orders:  Orders Placed This Encounter  Procedures  . XR Knee 1-2 Views Left   No orders of the defined types were placed in this encounter.     Procedures: No procedures performed   Clinical Data: No additional findings.   Subjective: Chief Complaint  Patient presents with  . Left Knee - Pain    HPI patient is a pleasant 74 year old female who comes in today with continued left knee pain.  She is status post left total knee replacement July 2020.  She has had pain and lack of strength since.  She was seen in our office about a month ago for this where she was given a prescription for physical therapy.  She has not been to therapy yet.  She is also complaining of flexion contracture of the right foot great toe.  This started to occur a few weeks ago and she is unable to straighten the toe.  She thinks that this is what is causing her to lose her balance not her left knee.  Review of Systems as detailed in HPI.  All others reviewed and are negative.   Objective: Vital Signs: There were no vitals taken for this visit.  Physical Exam well-developed well-nourished female no acute distress.  Alert and oriented x3.  Ortho  Exam examination of the left knee shows no effusion.  Range of motion 0 to 110 degrees.  She is stable back to varus stress.  There is no joint line tenderness.  She has 3 out of 5 strength with resisted straight leg raise.  Right foot great toe shows what appears to be a flexion contracture.  It is hard to straighten.  She is neurovascularly intact distally.  Specialty Comments:  No specialty comments available.  Imaging: XR Knee 1-2 Views Left  Result Date: 02/26/2020 Well-seated prosthesis without complication    PMFS History: Patient Active Problem List   Diagnosis Date Noted  . Painful orthopaedic hardware ([place]) 01/29/2020  . Protein-calorie malnutrition, severe 01/04/2020  . Diarrhea 01/04/2020  . Intractable nausea and vomiting 01/02/2020  . Nausea & vomiting 01/01/2020  . Preoperative evaluation to rule out surgical contraindication 11/19/2019  . Bilateral low back pain with sciatica 09/06/2019  . Peripheral neuropathy 09/06/2019  . Seborrheic keratoses 08/20/2019  . Left breast mass 08/20/2019  . History of left knee replacement 05/29/2019  . Macrocytic anemia 03/08/2019  . UTI (urinary tract infection) 03/04/2019  . Pain of left calf 01/26/2019  . Acute kidney injury ([place]) 01/25/2019  . Leg swelling 01/25/2019  . QT prolongation  01/25/2019  . Elevated d-dimer 01/23/2019  . Acute blood loss as cause of postoperative anemia 01/23/2019  . Cellulitis of leg, left 01/23/2019  . At risk for adverse drug event 01/22/2019  . Bipolar 1 disorder ([place])   . 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 Osteoarthritis of left knee 12/30/2018  . Anxiety 12/30/2018  . Stage 3 chronic kidney disease 06/28/2016  . Hypercholesterolemia 08/20/2015  . Type 2 diabetes mellitus ([place]) 02/28/2014  . Esophageal reflux 02/28/2014  . Stress incontinence, female 02/28/2014  . Hypothyroidism (acquired) 02/28/2014  . Essential hypertension 11/17/2011   Past Medical History:   Diagnosis Date  . Bilateral lower extremity edema    right > left  . Bipolar 1 disorder ([place])   . CKD (chronic kidney disease) stage 3, GFR 30-59 ml/min     Dr Lawson Radar, [facility]  . Diabetes, polyneuropathy ([place])    FEET AND FINGER TIPS  . Fibromyalgia   . GERD (gastroesophageal reflux disease)   . History of chronic gastritis   . Hyperlipidemia   . Hypertension    per pt was take off bp medication because of kidney disease told by her nephrologist  . Hypothyroidism   . Mixed incontinence urge and stress   . OA (osteoarthritis)    KNEES  . OAB (overactive bladder)   . PONV (postoperative nausea and vomiting)   . 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 Type 2 diabetes mellitus treated with insulin ([place])   . Wears glasses     Family History  Problem Relation Age of Onset  . Breast cancer Mother 72  . Cancer Father        ? type  . Breast cancer Sister 41  . Breast cancer Maternal Grandmother        ? age    Past Surgical History:  Procedure Laterality Date  . CHOLECYSTECTOMY OPEN  1990's  . COLONOSCOPY  last one 07-17-2014  . CYSTOSCOPY WITH INJECTION N/A 10/29/2016   Procedure: CYSTOSCOPY WITH INJECTION BOTOX 100 UNITS, urethral dilation;  Surgeon: Carolan Clines, MD;  Location: [facility];  Service: Urology;  Laterality: N/A;  . ESOPHAGOGASTRODUODENOSCOPY  last one 07-01-2016  . EXCISIONAL BREAST BX  1990   BENIGN  . ORIF TIBIA & FIBULA FRACTURES  2010   retained rod  . TONSILLECTOMY  age 74  . TOTAL KNEE ARTHROPLASTY Left 01/15/2019   Procedure: LEFT TOTAL KNEE ARTHROPLASTY;  Surgeon: Leandrew Koyanagi, MD;  Location: [facility];  Service: Orthopedics;  Laterality: Left;  Marland Kitchen VAGINAL HYSTERECTOMY  1979  . VENTRAL HERNIA REPAIR  1990's   Social History   Occupational History  . Not on file  Tobacco Use  . Smoking status: Never Smoker  . Smokeless tobacco: Never Used  Vaping Use  . Vaping Use: Never used  Substance and Sexual Activity   . Alcohol use: No  . Drug use: No  . Sexual activity: Not Currently

## 2020-02-27 ENCOUNTER — Encounter (HOSPITAL_BASED_OUTPATIENT_CLINIC_OR_DEPARTMENT_OTHER): Payer: Self-pay | Admitting: Orthopaedic Surgery

## 2020-02-27 ENCOUNTER — Other Ambulatory Visit: Payer: Self-pay

## 2020-02-29 ENCOUNTER — Telehealth: Payer: Self-pay | Admitting: Orthopaedic Surgery

## 2020-02-29 NOTE — Telephone Encounter (Signed)
Pt aware.

## 2020-02-29 NOTE — Telephone Encounter (Signed)
Pt would like to know if when she runs out of her muscle relaxers if she'll be able to get a refill? Pt states they help with her toes. Pt would like a CB   [phone]

## 2020-02-29 NOTE — Telephone Encounter (Signed)
sure

## 2020-03-01 ENCOUNTER — Other Ambulatory Visit (HOSPITAL_COMMUNITY): Payer: Medicare Other

## 2020-03-03 ENCOUNTER — Other Ambulatory Visit (HOSPITAL_COMMUNITY)
Admission: RE | Admit: 2020-03-03 | Discharge: 2020-03-03 | Disposition: A | Discharge status: Home / Self Care | Payer: Medicare Other | Source: Ambulatory Visit | Attending: Orthopaedic Surgery | Admitting: Orthopaedic Surgery

## 2020-03-03 ENCOUNTER — Encounter (HOSPITAL_BASED_OUTPATIENT_CLINIC_OR_DEPARTMENT_OTHER)
Admission: RE | Admit: 2020-03-03 | Discharge: 2020-03-03 | Disposition: A | Discharge status: Home / Self Care | Payer: Medicare Other | Source: Ambulatory Visit | Attending: Orthopaedic Surgery | Admitting: Orthopaedic Surgery

## 2020-03-03 DIAGNOSIS — Z01812 Encounter for preprocedural laboratory examination: Secondary | ICD-10-CM | POA: Insufficient documentation

## 2020-03-03 DIAGNOSIS — Z20822 Contact with and (suspected) exposure to covid-19: Secondary | ICD-10-CM | POA: Diagnosis not present

## 2020-03-03 LAB — SARS CORONAVIRUS 2 (TAT 6-24 HRS): SARS Coronavirus 2: NEGATIVE

## 2020-03-03 LAB — BASIC METABOLIC PANEL
Anion gap: 9 (ref 5–15)
BUN: 32 mg/dL — ABNORMAL HIGH (ref 8–23)
CO2: 28 mmol/L (ref 22–32)
Calcium: 9.2 mg/dL (ref 8.9–10.3)
Chloride: 104 mmol/L (ref 98–111)
Creatinine, Ser: 1.69 mg/dL — ABNORMAL HIGH (ref 0.44–1.00)
GFR calc Af Amer: 34 mL/min — ABNORMAL LOW (ref >60)
GFR calc non Af Amer: 30 mL/min — ABNORMAL LOW (ref >60)
Glucose, Bld: 301 mg/dL — ABNORMAL HIGH (ref 70–99)
Potassium: 5.1 mmol/L (ref 3.5–5.1)
Sodium: 141 mmol/L (ref 135–145)

## 2020-03-03 NOTE — Progress Notes (Signed)
Glucose-301, Dr. Sabra Heck aware, Notified Judeen Hammans at Dr. Phoebe Sharps office, will recheck CBG day of surgery.

## 2020-03-03 NOTE — Progress Notes (Signed)

## 2020-03-05 ENCOUNTER — Other Ambulatory Visit: Payer: Self-pay

## 2020-03-05 ENCOUNTER — Ambulatory Visit (HOSPITAL_BASED_OUTPATIENT_CLINIC_OR_DEPARTMENT_OTHER): Payer: Medicare Other | Admitting: Certified Registered"

## 2020-03-05 ENCOUNTER — Encounter (HOSPITAL_BASED_OUTPATIENT_CLINIC_OR_DEPARTMENT_OTHER): Payer: Self-pay | Admitting: Orthopaedic Surgery

## 2020-03-05 ENCOUNTER — Ambulatory Visit (HOSPITAL_BASED_OUTPATIENT_CLINIC_OR_DEPARTMENT_OTHER)
Admission: RE | Admit: 2020-03-05 | Discharge: 2020-03-05 | Disposition: A | Discharge status: Home / Self Care | Payer: Medicare Other | Attending: Orthopaedic Surgery | Admitting: Orthopaedic Surgery

## 2020-03-05 ENCOUNTER — Encounter (HOSPITAL_BASED_OUTPATIENT_CLINIC_OR_DEPARTMENT_OTHER)
Admission: RE | Disposition: A | Discharge status: Home / Self Care | Payer: Self-pay | Source: Home / Self Care | Attending: Orthopaedic Surgery

## 2020-03-05 DIAGNOSIS — F319 Bipolar disorder, unspecified: Secondary | ICD-10-CM | POA: Diagnosis not present

## 2020-03-05 DIAGNOSIS — Z96652 Presence of left artificial knee joint: Secondary | ICD-10-CM | POA: Insufficient documentation

## 2020-03-05 DIAGNOSIS — M25571 Pain in right ankle and joints of right foot: Secondary | ICD-10-CM | POA: Diagnosis not present

## 2020-03-05 DIAGNOSIS — Z79899 Other long term (current) drug therapy: Secondary | ICD-10-CM | POA: Insufficient documentation

## 2020-03-05 DIAGNOSIS — E785 Hyperlipidemia, unspecified: Secondary | ICD-10-CM | POA: Diagnosis not present

## 2020-03-05 DIAGNOSIS — N183 Chronic kidney disease, stage 3 unspecified: Secondary | ICD-10-CM | POA: Insufficient documentation

## 2020-03-05 DIAGNOSIS — M797 Fibromyalgia: Secondary | ICD-10-CM | POA: Diagnosis not present

## 2020-03-05 DIAGNOSIS — K219 Gastro-esophageal reflux disease without esophagitis: Secondary | ICD-10-CM | POA: Insufficient documentation

## 2020-03-05 DIAGNOSIS — M1990 Unspecified osteoarthritis, unspecified site: Secondary | ICD-10-CM | POA: Insufficient documentation

## 2020-03-05 DIAGNOSIS — Z8616 Personal history of COVID-19: Secondary | ICD-10-CM | POA: Diagnosis not present

## 2020-03-05 DIAGNOSIS — E1142 Type 2 diabetes mellitus with diabetic polyneuropathy: Secondary | ICD-10-CM | POA: Diagnosis not present

## 2020-03-05 DIAGNOSIS — Y828 Other medical devices associated with adverse incidents: Secondary | ICD-10-CM | POA: Insufficient documentation

## 2020-03-05 DIAGNOSIS — Z79890 Hormone replacement therapy (postmenopausal): Secondary | ICD-10-CM | POA: Insufficient documentation

## 2020-03-05 DIAGNOSIS — T8484XA Pain due to internal orthopedic prosthetic devices, implants and grafts, initial encounter: Secondary | ICD-10-CM | POA: Diagnosis present

## 2020-03-05 DIAGNOSIS — I129 Hypertensive chronic kidney disease with stage 1 through stage 4 chronic kidney disease, or unspecified chronic kidney disease: Secondary | ICD-10-CM | POA: Insufficient documentation

## 2020-03-05 DIAGNOSIS — E1122 Type 2 diabetes mellitus with diabetic chronic kidney disease: Secondary | ICD-10-CM | POA: Insufficient documentation

## 2020-03-05 DIAGNOSIS — E039 Hypothyroidism, unspecified: Secondary | ICD-10-CM | POA: Diagnosis not present

## 2020-03-05 DIAGNOSIS — Z794 Long term (current) use of insulin: Secondary | ICD-10-CM | POA: Diagnosis not present

## 2020-03-05 HISTORY — PX: HARDWARE REMOVAL: SHX979

## 2020-03-05 HISTORY — DX: Personal history of COVID-19: Z86.16

## 2020-03-05 LAB — GLUCOSE, CAPILLARY
Glucose-Capillary: 183 mg/dL — ABNORMAL HIGH (ref 70–99)
Glucose-Capillary: 145 mg/dL — ABNORMAL HIGH (ref 70–99)

## 2020-03-05 SURGERY — REMOVAL, HARDWARE
Anesthesia: General | Site: Ankle | Laterality: Right

## 2020-03-05 MED ORDER — CEFAZOLIN SODIUM-DEXTROSE 2-4 GM/100ML-% IV SOLN
2.0000 g | INTRAVENOUS | Status: AC
  Administered 2020-03-05: 2 g via INTRAVENOUS

## 2020-03-05 MED ORDER — BUPIVACAINE HCL (PF) 0.5 % IJ SOLN
INTRAMUSCULAR | Status: DC | PRN
  Administered 2020-03-05: 10 mL

## 2020-03-05 MED ORDER — ACETAMINOPHEN 500 MG PO TABS: 1000.0000 mg | ORAL_TABLET | Freq: Once | ORAL | Status: DC

## 2020-03-05 MED ORDER — DEXAMETHASONE SODIUM PHOSPHATE 10 MG/ML IJ SOLN
INTRAMUSCULAR | Status: DC | PRN
  Administered 2020-03-05: 5 mg via INTRAVENOUS

## 2020-03-05 MED ORDER — LIDOCAINE 2% (20 MG/ML) 5 ML SYRINGE
INTRAMUSCULAR | Status: DC | PRN
  Administered 2020-03-05: 40 mg via INTRAVENOUS

## 2020-03-05 MED ORDER — FENTANYL CITRATE (PF) 100 MCG/2ML IJ SOLN
INTRAMUSCULAR | Status: DC | PRN
  Administered 2020-03-05: 50 ug via INTRAVENOUS

## 2020-03-05 MED ORDER — ONDANSETRON HCL 4 MG/2ML IJ SOLN
INTRAMUSCULAR | Status: AC
  Filled 2020-03-05: qty 2

## 2020-03-05 MED ORDER — EPHEDRINE 5 MG/ML INJ
INTRAVENOUS | Status: AC
  Filled 2020-03-05: qty 10

## 2020-03-05 MED ORDER — FENTANYL CITRATE (PF) 100 MCG/2ML IJ SOLN
INTRAMUSCULAR | Status: AC
  Filled 2020-03-05: qty 2

## 2020-03-05 MED ORDER — PHENYLEPHRINE HCL (PRESSORS) 10 MG/ML IV SOLN
INTRAVENOUS | Status: AC
  Filled 2020-03-05: qty 1

## 2020-03-05 MED ORDER — LACTATED RINGERS IV SOLN: INTRAVENOUS | Status: DC

## 2020-03-05 MED ORDER — LIDOCAINE 2% (20 MG/ML) 5 ML SYRINGE
INTRAMUSCULAR | Status: AC
  Filled 2020-03-05: qty 5

## 2020-03-05 MED ORDER — OXYCODONE HCL 5 MG/5ML PO SOLN: 5.0000 mg | Freq: Once | ORAL | Status: DC | PRN

## 2020-03-05 MED ORDER — MEPERIDINE HCL 25 MG/ML IJ SOLN: 6.2500 mg | INTRAMUSCULAR | Status: DC | PRN

## 2020-03-05 MED ORDER — MIDAZOLAM HCL 2 MG/2ML IJ SOLN
INTRAMUSCULAR | Status: AC
  Filled 2020-03-05: qty 2

## 2020-03-05 MED ORDER — OXYCODONE HCL 5 MG PO TABS: 5.0000 mg | ORAL_TABLET | Freq: Once | ORAL | Status: DC | PRN

## 2020-03-05 MED ORDER — PROPOFOL 10 MG/ML IV BOLUS
INTRAVENOUS | Status: DC | PRN
  Administered 2020-03-05: 120 mg via INTRAVENOUS

## 2020-03-05 MED ORDER — HYDROCODONE-ACETAMINOPHEN 5-325 MG PO TABS: 1.0000 | ORAL_TABLET | Freq: Two times a day (BID) | ORAL | 0 refills | Status: DC | PRN

## 2020-03-05 MED ORDER — CEFAZOLIN SODIUM-DEXTROSE 2-4 GM/100ML-% IV SOLN
INTRAVENOUS | Status: AC
  Filled 2020-03-05: qty 100

## 2020-03-05 MED ORDER — DEXAMETHASONE SODIUM PHOSPHATE 10 MG/ML IJ SOLN
INTRAMUSCULAR | Status: AC
  Filled 2020-03-05: qty 1

## 2020-03-05 MED ORDER — ONDANSETRON HCL 4 MG/2ML IJ SOLN
INTRAMUSCULAR | Status: DC | PRN
  Administered 2020-03-05: 4 mg via INTRAVENOUS

## 2020-03-05 MED ORDER — FENTANYL CITRATE (PF) 100 MCG/2ML IJ SOLN
25.0000 ug | INTRAMUSCULAR | Status: DC | PRN
  Administered 2020-03-05: 25 ug via INTRAVENOUS

## 2020-03-05 MED ORDER — PROPOFOL 10 MG/ML IV BOLUS
INTRAVENOUS | Status: AC
  Filled 2020-03-05: qty 20

## 2020-03-05 MED ORDER — MIDAZOLAM HCL 2 MG/2ML IJ SOLN: 0.5000 mg | Freq: Once | INTRAMUSCULAR | Status: DC | PRN

## 2020-03-05 SURGICAL SUPPLY — 77 items
APL SKNCLS STERI-STRIP NONHPOA (GAUZE/BANDAGES/DRESSINGS)
BANDAGE ESMARK 6X9 LF (GAUZE/BANDAGES/DRESSINGS) ×1 IMPLANT
BENZOIN TINCTURE PRP APPL 2/3 (GAUZE/BANDAGES/DRESSINGS) IMPLANT
BLADE HEX COATED 2.75 (ELECTRODE) IMPLANT
BLADE SURG 15 STRL LF DISP TIS (BLADE) ×1 IMPLANT
BLADE SURG 15 STRL SS (BLADE) ×2
BNDG CMPR 9X6 STRL LF SNTH (GAUZE/BANDAGES/DRESSINGS) ×1
BNDG COHESIVE 3X5 TAN STRL LF (GAUZE/BANDAGES/DRESSINGS) ×2 IMPLANT
BNDG ELASTIC 4X5.8 VLCR STR LF (GAUZE/BANDAGES/DRESSINGS) ×2 IMPLANT
BNDG ELASTIC 6X5.8 VLCR STR LF (GAUZE/BANDAGES/DRESSINGS) IMPLANT
BNDG ESMARK 6X9 LF (GAUZE/BANDAGES/DRESSINGS) ×2
CANISTER SUCT 1200ML W/VALVE (MISCELLANEOUS) IMPLANT
COVER BACK TABLE 60X90IN (DRAPES) ×2 IMPLANT
COVER WAND RF STERILE (DRAPES) IMPLANT
CUFF TOURN SGL QUICK 24 (TOURNIQUET CUFF)
CUFF TOURN SGL QUICK 34 (TOURNIQUET CUFF)
CUFF TRNQT CYL 24X4X16.5-23 (TOURNIQUET CUFF) IMPLANT
CUFF TRNQT CYL 34X4.125X (TOURNIQUET CUFF) IMPLANT
DECANTER SPIKE VIAL GLASS SM (MISCELLANEOUS) IMPLANT
DRAPE EXTREMITY T 121X128X90 (DISPOSABLE) ×2 IMPLANT
DRAPE HALF SHEET 70X43 (DRAPES) ×2 IMPLANT
DRAPE IMP U-DRAPE 54X76 (DRAPES) ×2 IMPLANT
DRAPE OEC MINIVIEW 54X84 (DRAPES) IMPLANT
DRAPE U-SHAPE 47X51 STRL (DRAPES) ×2 IMPLANT
DURAPREP 26ML APPLICATOR (WOUND CARE) ×2 IMPLANT
ELECT REM PT RETURN 9FT ADLT (ELECTROSURGICAL) ×2
ELECTRODE REM PT RTRN 9FT ADLT (ELECTROSURGICAL) ×1 IMPLANT
GAUZE 4X4 16PLY RFD (DISPOSABLE) IMPLANT
GAUZE SPONGE 4X4 12PLY STRL (GAUZE/BANDAGES/DRESSINGS) ×2 IMPLANT
GAUZE XEROFORM 1X8 LF (GAUZE/BANDAGES/DRESSINGS) ×2 IMPLANT
GLOVE BIOGEL M 6.5 STRL (GLOVE) ×2 IMPLANT
GLOVE BIOGEL PI IND STRL 7.0 (GLOVE) ×2 IMPLANT
GLOVE BIOGEL PI INDICATOR 7.0 (GLOVE) ×2
GLOVE ECLIPSE 7.0 STRL STRAW (GLOVE) ×2 IMPLANT
GLOVE SKINSENSE NS SZ7.5 (GLOVE) ×1
GLOVE SKINSENSE STRL SZ7.5 (GLOVE) ×1 IMPLANT
GLOVE SURG SYN 7.5  E (GLOVE) ×2
GLOVE SURG SYN 7.5 E (GLOVE) ×1 IMPLANT
GOWN STRL REIN XL XLG (GOWN DISPOSABLE) ×2 IMPLANT
GOWN STRL REUS W/ TWL LRG LVL3 (GOWN DISPOSABLE) ×1 IMPLANT
GOWN STRL REUS W/ TWL XL LVL3 (GOWN DISPOSABLE) ×1 IMPLANT
GOWN STRL REUS W/TWL LRG LVL3 (GOWN DISPOSABLE) ×2
GOWN STRL REUS W/TWL XL LVL3 (GOWN DISPOSABLE) ×2
NEEDLE HYPO 22GX1.5 SAFETY (NEEDLE) IMPLANT
NEEDLE HYPO 25X1 1.5 SAFETY (NEEDLE) ×2 IMPLANT
NS IRRIG 1000ML POUR BTL (IV SOLUTION) ×2 IMPLANT
PACK BASIN DAY SURGERY FS (CUSTOM PROCEDURE TRAY) ×2 IMPLANT
PAD CAST 3X4 CTTN HI CHSV (CAST SUPPLIES) IMPLANT
PAD CAST 4YDX4 CTTN HI CHSV (CAST SUPPLIES) ×1 IMPLANT
PADDING CAST COTTON 3X4 STRL (CAST SUPPLIES)
PADDING CAST COTTON 4X4 STRL (CAST SUPPLIES) ×2
PADDING CAST COTTON 6X4 STRL (CAST SUPPLIES) IMPLANT
PADDING CAST SYN 6 (CAST SUPPLIES)
PADDING CAST SYNTHETIC 4 (CAST SUPPLIES)
PADDING CAST SYNTHETIC 4X4 STR (CAST SUPPLIES) IMPLANT
PADDING CAST SYNTHETIC 6X4 NS (CAST SUPPLIES) IMPLANT
PENCIL SMOKE EVACUATOR (MISCELLANEOUS) ×2 IMPLANT
SLEEVE SCD COMPRESS KNEE MED (MISCELLANEOUS) ×2 IMPLANT
SPLINT FAST PLASTER 5X30 (CAST SUPPLIES)
SPLINT FIBERGLASS 3X35 (CAST SUPPLIES) IMPLANT
SPLINT FIBERGLASS 4X30 (CAST SUPPLIES) IMPLANT
SPLINT PLASTER CAST FAST 5X30 (CAST SUPPLIES) IMPLANT
SPONGE LAP 18X18 RF (DISPOSABLE) IMPLANT
STAPLER VISISTAT (STAPLE) IMPLANT
STRIP CLOSURE SKIN 1/2X4 (GAUZE/BANDAGES/DRESSINGS) IMPLANT
SUCTION FRAZIER HANDLE 10FR (MISCELLANEOUS) ×1
SUCTION TUBE FRAZIER 10FR DISP (MISCELLANEOUS) ×1 IMPLANT
SUT ETHILON 3 0 PS 1 (SUTURE) ×2 IMPLANT
SUT MNCRL AB 4-0 PS2 18 (SUTURE) IMPLANT
SUT VIC AB 2-0 CT1 27 (SUTURE)
SUT VIC AB 2-0 CT1 TAPERPNT 27 (SUTURE) IMPLANT
SYR BULB EAR ULCER 3OZ GRN STR (SYRINGE) ×2 IMPLANT
SYR CONTROL 10ML LL (SYRINGE) ×2 IMPLANT
TOWEL GREEN STERILE FF (TOWEL DISPOSABLE) ×2 IMPLANT
TUBE CONNECTING 20X1/4 (TUBING) ×2 IMPLANT
UNDERPAD 30X36 HEAVY ABSORB (UNDERPADS AND DIAPERS) ×2 IMPLANT
YANKAUER SUCT BULB TIP NO VENT (SUCTIONS) IMPLANT

## 2020-03-05 NOTE — Transfer of Care (Signed)
Immediate Anesthesia Transfer of Care Note  Patient: [name]  Procedure(s) Performed: HARDWARE REMOVAL RIGHT ANKLE (Right Ankle)  Patient Location: PACU  Anesthesia Type:General  Level of Consciousness: awake and drowsy  Airway & Oxygen Therapy: Patient Spontanous Breathing and Patient connected to face mask oxygen  Post-op Assessment: Report given to RN and Post -op Vital signs reviewed and stable  Post vital signs: Reviewed and stable  Last Vitals:  Vitals Value Taken Time  BP 142/61 03/05/20 1422  Temp    Pulse 64 03/05/20 1425  Resp 13 03/05/20 1425  SpO2 100 % 03/05/20 1425  Vitals shown include unvalidated device data.  Last Pain:  Vitals:   03/05/20 1217  TempSrc: Oral  PainSc: 0-No pain         Complications: No complications documented.

## 2020-03-05 NOTE — H&P (Signed)
PREOPERATIVE H&P  Chief Complaint: painful hardware right ankle  HPI: Toni Hill is a 74 y.o. female who presents for surgical treatment of painful hardware right ankle.  She denies any changes in medical history.  Past Medical History:  Diagnosis Date  . Bilateral lower extremity edema    right > left  . Bipolar 1 disorder ([place])   . CKD (chronic kidney disease) stage 3, GFR 30-59 ml/min     Dr Lawson Radar, [facility]  . Diabetes, polyneuropathy ([place])    FEET AND FINGER TIPS  . Fibromyalgia   . GERD (gastroesophageal reflux disease)   . History of chronic gastritis   . History of COVID-24 Aug 2019  . Hyperlipidemia   . Hypertension    per pt was take off bp medication because of kidney disease told by her nephrologist  . Hypothyroidism   . Mixed incontinence urge and stress   . OA (osteoarthritis)    KNEES  . OAB (overactive bladder)   . PONV (postoperative nausea and vomiting)   . 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 Type 2 diabetes mellitus treated with insulin ([place])   . Wears glasses    Past Surgical History:  Procedure Laterality Date  . CHOLECYSTECTOMY OPEN  1990's  . COLONOSCOPY  last one 07-17-2014  . CYSTOSCOPY WITH INJECTION N/A 10/29/2016   Procedure: CYSTOSCOPY WITH INJECTION BOTOX 100 UNITS, urethral dilation;  Surgeon: Carolan Clines, MD;  Location: [facility];  Service: Urology;  Laterality: N/A;  . ESOPHAGOGASTRODUODENOSCOPY  last one 07-01-2016  . EXCISIONAL BREAST BX  1990   BENIGN  . ORIF TIBIA & FIBULA FRACTURES  2010   retained rod  . TONSILLECTOMY  age 31  . TOTAL KNEE ARTHROPLASTY Left 01/15/2019   Procedure: LEFT TOTAL KNEE ARTHROPLASTY;  Surgeon: Leandrew Koyanagi, MD;  Location: [facility];  Service: Orthopedics;  Laterality: Left;  Marland Kitchen VAGINAL HYSTERECTOMY  1979  . VENTRAL HERNIA REPAIR  1990's   Social History   Socioeconomic History  . Marital status: Married    Spouse name: Not on file  . Number of  children: Not on file  . Years of education: Not on file  . Highest education level: Not on file  Occupational History  . Not on file  Tobacco Use  . Smoking status: Never Smoker  . Smokeless tobacco: Never Used  Vaping Use  . Vaping Use: Never used  Substance and Sexual Activity  . Alcohol use: No  . Drug use: No  . Sexual activity: Not Currently  Other Topics Concern  . Not on file  Social History Narrative  . Not on file   Social Determinants of Health   Financial Resource Strain:   . Difficulty of Paying Living Expenses: Not on file  Food Insecurity:   . Worried About Charity fundraiser in the Last Year: Not on file  . Ran Out of Food in the Last Year: Not on file  Transportation Needs:   . Lack of Transportation (Medical): Not on file  . Lack of Transportation (Non-Medical): Not on file  Physical Activity:   . Days of Exercise per Week: Not on file  . Minutes of Exercise per Session: Not on file  Stress:   . Feeling of Stress : Not on file  Social Connections:   . Frequency of Communication with Friends and Family: Not on file  . Frequency of Social Gatherings with Friends  and Family: Not on file  . Attends Religious Services: Not on file  . Active Member of Clubs or Organizations: Not on file  . Attends Archivist Meetings: Not on file  . Marital Status: Not on file   Family History  Problem Relation Age of Onset  . Breast cancer Mother 102  . Cancer Father        ? type  . Breast cancer Sister 67  . Breast cancer Maternal Grandmother        ? age   Allergies  Allergen Reactions  . 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 Strongly recommend against using this antibiotic in the future.   . Cephalexin Itching  . Morphine And Related Nausea And Vomiting  . Vioxx [Rofecoxib]     GI bleeding  . Penicillins Hives and Itching    Has patient had a PCN reaction causing immediate rash,  facial/tongue/throat swelling, SOB or lightheadedness with hypotension: No Has patient had a PCN reaction causing severe rash involving mucus membranes or skin necrosis: No Has patient had a PCN reaction that required hospitalization: no Has patient had a PCN reaction occurring within the last 10 years: No If all of the above answers are "NO", then may proceed with Cephalosporin use.    Prior to Admission medications   Medication Sig Start Date End Date Taking? Authorizing Provider  amLODipine (NORVASC) 5 MG tablet Take 5 mg by mouth daily. 05/03/19  Yes [provider]  baclofen (LIORESAL) 10 MG tablet TAKE 0.5-1 TABLETS (5-10 MG TOTAL) BY MOUTH 3 (THREE) TIMES DAILY AS NEEDED FOR MUSCLE SPASMS. Patient taking differently: Take 10 mg by mouth 2 (two) times daily.  10/18/19  Yes Hilts, Legrand Como, MD  Calcium Carb-Cholecalciferol (CALCIUM 600/VITAMIN D3) 600-800 MG-UNIT TABS Take 1 tablet by mouth daily.    Yes [provider]  Cholecalciferol (EQL VITAMIN D3) 50 MCG (2000 UT) CAPS Take 2,000 Units by mouth daily.    Yes [provider]  ezetimibe (ZETIA) 10 MG tablet Take 1 tablet (10 mg total) by mouth every evening. 11/07/19  Yes Maudie Mercury, MD  gabapentin (NEURONTIN) 300 MG capsule Take 1 capsule (300 mg total) by mouth daily as needed (pain and neuropathy). 01/25/20  Yes Marianna Payment, MD  hydrocortisone 2.5 % cream Apply 1 application topically 2 (two) times daily.    Yes [provider]  insulin detemir (LEVEMIR) 100 unit/ml SOLN Inject 0.15 mLs (15 Units total) into the skin daily. 15 units nightly, aim to keep CBG between 140-180. 01/11/20 04/10/20 Yes Marianna Payment, MD  levothyroxine (SYNTHROID) 100 MCG tablet Take 1 tablet (100 mcg total) by mouth daily. 01/06/20 01/05/21 Yes Asencion Noble, MD  ondansetron (ZOFRAN) 4 MG tablet Take 1 tablet (4 mg total) by mouth 2 (two) times daily. 11/30/19  Yes Maudie Mercury, MD  pantoprazole (PROTONIX) 40 MG tablet  TAKE 1 TABLET BY MOUTH EVERY DAY 01/10/20  Yes Marianna Payment, MD  sertraline (ZOLOFT) 100 MG tablet TAKE 1 TABLET BY MOUTH EVERY DAY Patient taking differently: Take 100 mg by mouth in the morning.  12/10/19  Yes Axel Filler, MD  simvastatin (ZOCOR) 40 MG tablet Take 1 tablet (40 mg total) by mouth every evening. 10/25/19  Yes Maudie Mercury, MD  tiZANidine (ZANAFLEX) 4 MG tablet Take 1 tablet (4 mg total) by mouth 2 (two) times daily as needed for muscle spasms. 02/26/20  Yes Aundra Dubin, PA-C  vitamin  B-12 (CYANOCOBALAMIN) 500 MCG tablet Take 1,000 mcg by mouth in the morning and at bedtime.    Yes [provider]  Insulin Pen Needle (B-D ULTRAFINE III SHORT PEN) 31G X 8 MM MISC 1 each by Does not apply route daily. Diagnosis code: E11.9. Used to inject insulin daily. 02/20/20   Asencion Noble, MD     Positive ROS: All other systems have been reviewed and were otherwise negative with the exception of those mentioned in the HPI and as above.  Physical Exam: General: Alert, no acute distress Cardiovascular: No pedal edema Respiratory: No cyanosis, no use of accessory musculature GI: abdomen soft Skin: No lesions in the area of chief complaint Neurologic: Sensation intact distally Psychiatric: Patient is competent for consent with normal mood and affect Lymphatic: no lymphedema  MUSCULOSKELETAL: exam stable  Assessment: painful hardware right ankle  Plan: Plan for Procedure(s): HARDWARE REMOVAL RIGHT ANKLE  The risks benefits and alternatives were discussed with the patient including but not limited to the risks of nonoperative treatment, versus surgical intervention including infection, bleeding, nerve injury,  blood clots, cardiopulmonary complications, morbidity, mortality, among others, and they were willing to proceed.   Preoperative templating of the joint replacement has been completed, documented, and submitted to the Operating Room personnel in order to  optimize intra-operative equipment management.   Eduard Roux, MD 03/05/2020 7:04 AM

## 2020-03-05 NOTE — Op Note (Signed)
   Date of Surgery: 03/05/2020  INDICATIONS: Toni Hill is a 74 y.o.-year-old female with a right ankle symptomatic hardware.  The patient did consent to the procedure after discussion of the risks and benefits.  PREOPERATIVE DIAGNOSIS: Painful orthopedic hardware of right ankle  POSTOPERATIVE DIAGNOSIS: Same.  PROCEDURE: Removal of 2 distal interlocking screw from right ankle through 2 separate incisions  SURGEON: N. Eduard Roux, M.D.  ASSIST: Ciro Backer [place], [place]; necessary for the timely completion of procedure and due to complexity of procedure.  ANESTHESIA:  general, local  IV FLUIDS AND URINE: See anesthesia.  ESTIMATED BLOOD LOSS: Minimal mL.  DRAINS: None  COMPLICATIONS: see description of procedure.  DESCRIPTION OF PROCEDURE: The patient was brought to the operating room.  The patient had been signed prior to the procedure and this was documented. The patient had the anesthesia placed by the anesthesiologist.  A time-out was performed to confirm that this was the correct patient, site, side and location. The patient did receive antibiotics prior to the incision and was re-dosed during the procedure as needed at indicated intervals.  An esmarch tourniquet was placed.  The patient had the operative extremity prepped and draped in the standard surgical fashion.    Two 1 cm incisions were made directly over the palpable screw heads in the ankle region.  One was made anteriorly and one was made medially.  Blunt dissection was carried down through the subcutaneous tissue and hemostasis was obtained.  The screw heads were exposed sequentially and a large hex screwdriver was used to back the screw out by hand without difficulty.  The surgical wounds were then thoroughly irrigated and closed in layered fashion using 2-0 Vicryl and 3-0 nylon.  Sterile dressings were applied.  Patient tolerated procedure well had no immediate complications.  POSTOPERATIVE PLAN: Discharge home.   Weight-bear as tolerated.  Follow-up in 2 weeks for suture removal.  Toni Cecil, MD 2:11 PM

## 2020-03-05 NOTE — Anesthesia Postprocedure Evaluation (Signed)
Anesthesia Post Note  Patient: Toni Hill  Procedure(s) Performed: HARDWARE REMOVAL RIGHT ANKLE (Right Ankle)     Patient location during evaluation: PACU Anesthesia Type: General Level of consciousness: awake and alert, patient cooperative and oriented Pain management: pain level controlled Vital Signs Assessment: post-procedure vital signs reviewed and stable Respiratory status: spontaneous breathing, nonlabored ventilation and respiratory function stable Cardiovascular status: blood pressure returned to baseline and stable Postop Assessment: no apparent nausea or vomiting Anesthetic complications: no   No complications documented.  Last Vitals:  Vitals:   03/05/20 1430 03/05/20 1445  BP: (!) 143/61 (!) 142/66  Pulse: 65 65  Resp: 13 13  Temp:    SpO2: 100% 100%    Last Pain:  Vitals:   03/05/20 1217  TempSrc: Oral  PainSc: 0-No pain        RLE Motor Response: Purposeful movement (03/05/20 1445) RLE Sensation: Full sensation (03/05/20 1445)      Avanni Turnbaugh,E. Aiya Keach

## 2020-03-05 NOTE — Discharge Instructions (Signed)
 Postoperative instructions:  Weightbearing instructions: as tolerated  Keep your dressing and/or splint clean and dry at all times.  You can remove your dressing on post-operative day #3 and change with a dry/sterile dressing or Band-Aids as needed thereafter.    Incision instructions:  Do not soak your incision for 3 weeks after surgery.  If the incision gets wet, pat dry and do not scrub the incision.  Pain control:  You have been given a prescription to be taken as directed for post-operative pain control.  In addition, elevate the operative extremity above the heart at all times to prevent swelling and throbbing pain.  Take over-the-counter Colace, 100mg by mouth twice a day while taking narcotic pain medications to help prevent constipation.  Follow up appointments: 1) 14 days for suture removal and wound check. 2) Dr. Xu as scheduled.   -------------------------------------------------------------------------------------------------------------  After Surgery Pain Control:  After your surgery, post-surgical discomfort or pain is likely. This discomfort can last several days to a few weeks. At certain times of the day your discomfort may be more intense.  Did you receive a nerve block?  A nerve block can provide pain relief for one hour to two days after your surgery. As long as the nerve block is working, you will experience little or no sensation in the area the surgeon operated on.  As the nerve block wears off, you will begin to experience pain or discomfort. It is very important that you begin taking your prescribed pain medication before the nerve block fully wears off. Treating your pain at the first sign of the block wearing off will ensure your pain is better controlled and more tolerable when full-sensation returns. Do not wait until the pain is intolerable, as the medicine will be less effective. It is better to treat pain in advance than to try and catch up.  General  Anesthesia:  If you did not receive a nerve block during your surgery, you will need to start taking your pain medication shortly after your surgery and should continue to do so as prescribed by your surgeon.  Pain Medication:  Most commonly we prescribe Vicodin and Percocet for post-operative pain. Both of these medications contain a combination of acetaminophen (Tylenol) and a narcotic to help control pain.   It takes between 30 and 45 minutes before pain medication starts to work. It is important to take your medication before your pain level gets too intense.   Nausea is a common side effect of many pain medications. You will want to eat something before taking your pain medicine to help prevent nausea.   If you are taking a prescription pain medication that contains acetaminophen, we recommend that you do not take additional over the counter acetaminophen (Tylenol).  Other pain relieving options:   Using a cold pack to ice the affected area a few times a day (15 to 20 minutes at a time) can help to relieve pain, reduce swelling and bruising.   Elevation of the affected area can also help to reduce pain and swelling.  Post Anesthesia Home Care Instructions  Activity: Get plenty of rest for the remainder of the day. A responsible individual must stay with you for 24 hours following the procedure.  For the next 24 hours, DO NOT: -Drive a car -Operate machinery -Drink alcoholic beverages -Take any medication unless instructed by your physician -Make any legal decisions or sign important papers.  Meals: Start with liquid foods such as gelatin or soup. Progress   to regular foods as tolerated. Avoid greasy, spicy, heavy foods. If nausea and/or vomiting occur, drink only clear liquids until the nausea and/or vomiting subsides. Call your physician if vomiting continues.  Special Instructions/Symptoms: Your throat may feel dry or sore from the anesthesia or the breathing tube placed in  your throat during surgery. If this causes discomfort, gargle with warm salt water. The discomfort should disappear within 24 hours.  If you had a scopolamine patch placed behind your ear for the management of post- operative nausea and/or vomiting:  1. The medication in the patch is effective for 72 hours, after which it should be removed.  Wrap patch in a tissue and discard in the trash. Wash hands thoroughly with soap and water. 2. You may remove the patch earlier than 72 hours if you experience unpleasant side effects which may include dry mouth, dizziness or visual disturbances. 3. Avoid touching the patch. Wash your hands with soap and water after contact with the patch.     

## 2020-03-05 NOTE — Anesthesia Procedure Notes (Signed)
Procedure Name: LMA Insertion Date/Time: 03/05/2020 1:49 PM Performed by: [place] Karver Fadden C, CRNA Pre-anesthesia Checklist: Patient identified, Emergency Drugs available, Suction available and Patient being monitored Patient Re-evaluated:Patient Re-evaluated prior to induction Oxygen Delivery Method: Circle system utilized Preoxygenation: Pre-oxygenation with 100% oxygen Induction Type: IV induction Ventilation: Mask ventilation without difficulty LMA: LMA inserted LMA Size: 4.0 Number of attempts: 1 Airway Equipment and Method: Bite block Placement Confirmation: positive ETCO2 Tube secured with: Tape Dental Injury: Teeth and Oropharynx as per pre-operative assessment

## 2020-03-05 NOTE — Anesthesia Preprocedure Evaluation (Addendum)
Anesthesia Evaluation  Patient identified by MRN, date of birth, ID band Patient awake    Reviewed: Allergy & Precautions, NPO status , Patient's Chart, lab work & pertinent test results  History of Anesthesia Complications (+) PONV  Airway Mallampati: II  TM Distance: >3 FB Neck ROM: Full    Dental  (+) Dental Advisory Given, Missing   Pulmonary neg pulmonary ROS,  03/03/2020 SARS coronavirus NEG   breath sounds clear to auscultation       Cardiovascular hypertension, Pt. on medications (-) angina Rhythm:Regular Rate:Normal  '19 stress test: no clinical ischemia during vasodilator stress, normal isotope uptake following Lexiscan injection and at rest.no evidence of ischemia.Normal LV function with EF of 67 %.     Neuro/Psych Anxiety Depression Bipolar Disorder Diabetic neuropathy    GI/Hepatic Neg liver ROS, GERD  Controlled,  Endo/Other  diabetes (glu 183), Insulin DependentHypothyroidism   Renal/GU Renal InsufficiencyRenal disease     Musculoskeletal  (+) Arthritis , Osteoarthritis,  Fibromyalgia -  Abdominal   Peds  Hematology   Anesthesia Other Findings   Reproductive/Obstetrics                            Anesthesia Physical Anesthesia Plan  ASA: III  Anesthesia Plan: General   Post-op Pain Management:    Induction: Intravenous  PONV Risk Score and Plan: 4 or greater and Ondansetron, Dexamethasone and Treatment may vary due to age or medical condition  Airway Management Planned: LMA  Additional Equipment: None  Intra-op Plan:   Post-operative Plan:   Informed Consent: I have reviewed the patients History and Physical, chart, labs and discussed the procedure including the risks, benefits and alternatives for the proposed anesthesia with the patient or authorized representative who has indicated his/her understanding and acceptance.     Dental advisory given  Plan  Discussed with: CRNA and Surgeon  Anesthesia Plan Comments:        Anesthesia Quick Evaluation

## 2020-03-06 ENCOUNTER — Telehealth: Payer: Self-pay | Admitting: Orthopaedic Surgery

## 2020-03-06 ENCOUNTER — Encounter (HOSPITAL_BASED_OUTPATIENT_CLINIC_OR_DEPARTMENT_OTHER): Payer: Self-pay | Admitting: Orthopaedic Surgery

## 2020-03-06 NOTE — Telephone Encounter (Signed)
Pt called stating she would like to get her tizanidine called in but would like to see if she could get a higher dosage as she runs out every 10 days?  [phone]

## 2020-03-07 ENCOUNTER — Other Ambulatory Visit: Payer: Self-pay | Admitting: Physician Assistant

## 2020-03-07 MED ORDER — TIZANIDINE HCL 4 MG PO TABS: 4.0000 mg | ORAL_TABLET | Freq: Two times a day (BID) | ORAL | 0 refills | Status: DC | PRN

## 2020-03-07 NOTE — Telephone Encounter (Signed)
I called pt and advise of message below. Will call with any questions.

## 2020-03-07 NOTE — Telephone Encounter (Signed)
I sent in new rx and wrote for #40.  Make sure not taking any other muscle relaxers

## 2020-03-13 ENCOUNTER — Telehealth: Payer: Self-pay | Admitting: Orthopaedic Surgery

## 2020-03-13 ENCOUNTER — Other Ambulatory Visit: Payer: Self-pay

## 2020-03-13 ENCOUNTER — Telehealth: Payer: Self-pay

## 2020-03-13 DIAGNOSIS — Z9889 Other specified postprocedural states: Secondary | ICD-10-CM

## 2020-03-13 NOTE — Telephone Encounter (Signed)
Can you please document referral faxed to [facility] [phone]

## 2020-03-13 NOTE — Telephone Encounter (Signed)
Ok to do

## 2020-03-13 NOTE — Addendum Note (Signed)
Addended byLaurann Montana on: 03/13/2020 04:49 PM   Modules accepted: Orders

## 2020-03-13 NOTE — Telephone Encounter (Signed)
Faxed to provided number from pharmacist [phone]

## 2020-03-13 NOTE — Telephone Encounter (Signed)
Patient called and is requesting a script for a cover for her cast/splint. She also is requesting new order for tub chair. Tyhee for this?  Wants sent to [facility] in [place].

## 2020-03-13 NOTE — Telephone Encounter (Signed)
Patient called.   She needs an rx for a new wheelchair faxed to [facility] in [place].   Call back: [phone]

## 2020-03-14 ENCOUNTER — Telehealth: Payer: Self-pay | Admitting: Orthopaedic Surgery

## 2020-03-14 ENCOUNTER — Telehealth: Payer: Self-pay | Admitting: Dietician

## 2020-03-14 DIAGNOSIS — E1140 Type 2 diabetes mellitus with diabetic neuropathy, unspecified: Secondary | ICD-10-CM

## 2020-03-14 MED ORDER — ONETOUCH VERIO REFLECT W/DEVICE KIT: PACK | 0 refills | Status: DC

## 2020-03-14 MED ORDER — ONETOUCH VERIO VI STRP: ORAL_STRIP | 3 refills | Status: DC

## 2020-03-14 MED ORDER — ONETOUCH DELICA LANCETS 33G MISC: 3 refills | Status: AC

## 2020-03-14 NOTE — Telephone Encounter (Signed)
Toni Hill calls again and agrees to check her blood sugar with a meter until she can get the sensors for her Libre 14 day cgm.

## 2020-03-14 NOTE — Telephone Encounter (Signed)
Patient called. Says the RX that was sent to [facility], was not signed by Dr. Erlinda Hong. [facility] will need a signed RX faxed for her wheelchair. Her call back number is [phone]

## 2020-03-14 NOTE — Telephone Encounter (Signed)
Holding for you to have Dr Erlinda Hong to sign

## 2020-03-14 NOTE — Telephone Encounter (Signed)
Patient called requesting refill for her Freestyle Libre 14 day sensors. She is on her last one. She also mentioned she would call the endocrinologist she saw and that the mail order company did not get the necessary paperwork.  I do not see the Freestyle Libre 14 day sensors on her medication list. I will look for the papers in the red team's box. Can order a meter and supplies if needed

## 2020-03-17 ENCOUNTER — Telehealth: Payer: Self-pay

## 2020-03-17 NOTE — Telephone Encounter (Signed)
Called patient. Advised her this was signed and faxed back to them this AM. She will contact pharm

## 2020-03-17 NOTE — Telephone Encounter (Signed)
Signed and faxed back.

## 2020-03-17 NOTE — Telephone Encounter (Signed)
Patient called prescription for wheelchair was sent  and it was sent without being signed, she needs one sent in that's signed. She wants prescription by today. call back:[phone]

## 2020-03-18 NOTE — Telephone Encounter (Signed)
Notes faxed to [facility] per their request for cgm supplies. Patient notified

## 2020-03-20 ENCOUNTER — Ambulatory Visit (INDEPENDENT_AMBULATORY_CARE_PROVIDER_SITE_OTHER): Payer: Medicare Other

## 2020-03-20 ENCOUNTER — Encounter: Payer: Self-pay | Admitting: Orthopaedic Surgery

## 2020-03-20 ENCOUNTER — Ambulatory Visit (INDEPENDENT_AMBULATORY_CARE_PROVIDER_SITE_OTHER): Payer: Medicare Other | Admitting: Orthopaedic Surgery

## 2020-03-20 ENCOUNTER — Other Ambulatory Visit: Payer: Self-pay

## 2020-03-20 VITALS — BP 122/72 | HR 94 | Ht 69.0 in | Wt 170.6 lb

## 2020-03-20 DIAGNOSIS — M25562 Pain in left knee: Secondary | ICD-10-CM

## 2020-03-20 DIAGNOSIS — Z9889 Other specified postprocedural states: Secondary | ICD-10-CM

## 2020-03-20 DIAGNOSIS — Z96652 Presence of left artificial knee joint: Secondary | ICD-10-CM | POA: Diagnosis not present

## 2020-03-20 DIAGNOSIS — G8929 Other chronic pain: Secondary | ICD-10-CM

## 2020-03-20 NOTE — Progress Notes (Signed)
Post-Op Visit Note   Patient: Toni Hill           Date of Birth: 02/24/1946           MRN: [id number] Visit Date: 03/20/2020 PCP: Maudie Mercury, MD   Assessment & Plan:  Chief Complaint:  Chief Complaint  Patient presents with  . Right Ankle - Follow-up    Right ankle hardware removal 03/05/2020 Checked her blood sugar (220)   Visit Diagnoses:  1. S/P hardware removal   2. Chronic pain of left knee     Plan: Dreamer is s/p distal interlocking screw removal from the right ankle.  Doing well overall.  She is concerned about her right toes clawing and being painful.  She is a diabetic.  We will refer patient to neurology for this condition.  She may need nerve conduction studies.  Sutures removed today from the surgical incisions.  She is doing well from the standpoint.  She is also complaining of some left knee pain.  X-rays of the left knee were unremarkable.  I think this is due to her compensation from the right knee pain.  Recommend symptomatic treatment.  Follow-up as needed.  Follow-Up Instructions: Return if symptoms worsen or fail to improve.   Orders:  Orders Placed This Encounter  Procedures  . XR Tibia/Fibula Right  . XR KNEE 3 VIEW LEFT   No orders of the defined types were placed in this encounter.   Imaging: XR KNEE 3 VIEW LEFT  Result Date: 03/20/2020 Stable total knee replacement without complication  XR Tibia/Fibula Right  Result Date: 03/20/2020 Status post distal interlocking screw removal.   PMFS History: Patient Active Problem List   Diagnosis Date Noted  . Painful orthopaedic hardware ([place]) 01/29/2020  . Protein-calorie malnutrition, severe 01/04/2020  . Diarrhea 01/04/2020  . Intractable nausea and vomiting 01/02/2020  . Nausea & vomiting 01/01/2020  . Preoperative evaluation to rule out surgical contraindication 11/19/2019  . Bilateral low back pain with sciatica 09/06/2019  . Peripheral neuropathy 09/06/2019  . Seborrheic keratoses  08/20/2019  . Left breast mass 08/20/2019  . History of left knee replacement 05/29/2019  . Macrocytic anemia 03/08/2019  . UTI (urinary tract infection) 03/04/2019  . Pain of left calf 01/26/2019  . Acute kidney injury ([place]) 01/25/2019  . Leg swelling 01/25/2019  . QT prolongation 01/25/2019  . Elevated d-dimer 01/23/2019  . Acute blood loss as cause of postoperative anemia 01/23/2019  . Cellulitis of leg, left 01/23/2019  . At risk for adverse drug event 01/22/2019  . Bipolar 1 disorder ([place])   . 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 Osteoarthritis of left knee 12/30/2018  . Anxiety 12/30/2018  . Stage 3 chronic kidney disease 06/28/2016  . Hypercholesterolemia 08/20/2015  . Type 2 diabetes mellitus ([place]) 02/28/2014  . Esophageal reflux 02/28/2014  . Stress incontinence, female 02/28/2014  . Hypothyroidism (acquired) 02/28/2014  . Essential hypertension 11/17/2011   Past Medical History:  Diagnosis Date  . Bilateral lower extremity edema    right > left  . Bipolar 1 disorder ([place])   . CKD (chronic kidney disease) stage 3, GFR 30-59 ml/min     Dr Lawson Radar, [facility]  . Diabetes, polyneuropathy ([place])    FEET AND FINGER TIPS  . Fibromyalgia   . GERD (gastroesophageal reflux disease)   . History of chronic gastritis   . History of COVID-24 Aug 2019  . Hyperlipidemia   . Hypertension  per pt was take off bp medication because of kidney disease told by her nephrologist  . Hypothyroidism   . Mixed incontinence urge and stress   . OA (osteoarthritis)    KNEES  . OAB (overactive bladder)   . PONV (postoperative nausea and vomiting)   . 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 Type 2 diabetes mellitus treated with insulin ([place])   . Wears glasses     Family History  Problem Relation Age of Onset  . Breast cancer Mother 56  . Cancer Father        ? type  . Breast cancer Sister 90  . Breast cancer Maternal Grandmother        ? age      Past Surgical History:  Procedure Laterality Date  . CHOLECYSTECTOMY OPEN  1990's  . COLONOSCOPY  last one 07-17-2014  . CYSTOSCOPY WITH INJECTION N/A 10/29/2016   Procedure: CYSTOSCOPY WITH INJECTION BOTOX 100 UNITS, urethral dilation;  Surgeon: Carolan Clines, MD;  Location: [facility];  Service: Urology;  Laterality: N/A;  . ESOPHAGOGASTRODUODENOSCOPY  last one 07-01-2016  . EXCISIONAL BREAST BX  1990   BENIGN  . HARDWARE REMOVAL Right 03/05/2020   Procedure: HARDWARE REMOVAL RIGHT ANKLE;  Surgeon: Leandrew Koyanagi, MD;  Location: [facility];  Service: Orthopedics;  Laterality: Right;  . ORIF TIBIA & FIBULA FRACTURES  2010   retained rod  . TONSILLECTOMY  age 63  . TOTAL KNEE ARTHROPLASTY Left 01/15/2019   Procedure: LEFT TOTAL KNEE ARTHROPLASTY;  Surgeon: Leandrew Koyanagi, MD;  Location: [facility];  Service: Orthopedics;  Laterality: Left;  Marland Kitchen VAGINAL HYSTERECTOMY  1979  . VENTRAL HERNIA REPAIR  1990's   Social History   Occupational History  . Not on file  Tobacco Use  . Smoking status: Never Smoker  . Smokeless tobacco: Never Used  Vaping Use  . Vaping Use: Never used  Substance and Sexual Activity  . Alcohol use: No  . Drug use: No  . Sexual activity: Not Currently

## 2020-04-27 ENCOUNTER — Other Ambulatory Visit: Payer: Self-pay | Admitting: Internal Medicine

## 2020-04-30 ENCOUNTER — Other Ambulatory Visit: Payer: Self-pay | Admitting: Internal Medicine

## 2020-04-30 DIAGNOSIS — G6289 Other specified polyneuropathies: Secondary | ICD-10-CM

## 2020-04-30 DIAGNOSIS — M5440 Lumbago with sciatica, unspecified side: Secondary | ICD-10-CM

## 2020-05-05 ENCOUNTER — Telehealth: Payer: Self-pay | Admitting: Orthopaedic Surgery

## 2020-05-05 NOTE — Telephone Encounter (Signed)
Can you call in tramadol 50mg  1 tab po q6 hours prn pain #30

## 2020-05-05 NOTE — Telephone Encounter (Signed)
Pt called crying stating her leg is hurting so bad.  She is requesting pain medication, she stated she is dragging her leg behind her due to the pain.   Pt states she has taken Tylenol and it hasn't touched the pain

## 2020-05-06 ENCOUNTER — Telehealth: Payer: Self-pay | Admitting: Orthopaedic Surgery

## 2020-05-06 ENCOUNTER — Other Ambulatory Visit: Payer: Self-pay

## 2020-05-06 DIAGNOSIS — Z9889 Other specified postprocedural states: Secondary | ICD-10-CM

## 2020-05-06 MED ORDER — TRAMADOL HCL 50 MG PO TABS: ORAL_TABLET | ORAL | 0 refills | Status: DC

## 2020-05-06 NOTE — Telephone Encounter (Signed)
Patient called requesting new pain medication. Patient states tramadol is not touching the pain and need something stronger. Patient is asking for hydrocodone or something equal to that medication. Please send to pharmacy on file. Patient phone number is [phone].

## 2020-05-06 NOTE — Telephone Encounter (Signed)
Called into pharm  

## 2020-05-06 NOTE — Telephone Encounter (Signed)
See other msg

## 2020-05-06 NOTE — Telephone Encounter (Signed)
Called patient to advise rx called into pharm. She states tramadol does not help and would like Hydrocodone instead.

## 2020-05-06 NOTE — Telephone Encounter (Signed)
Referral made to pain clinic. 

## 2020-05-06 NOTE — Telephone Encounter (Signed)
We will need refer to pain clinic then as we cannot write anymore narcotics

## 2020-05-11 ENCOUNTER — Emergency Department (HOSPITAL_COMMUNITY)
Admission: EM | Admit: 2020-05-11 | Discharge: 2020-05-11 | Disposition: A | Discharge status: Home / Self Care | Payer: Medicare Other | Attending: Emergency Medicine | Admitting: Emergency Medicine

## 2020-05-11 ENCOUNTER — Other Ambulatory Visit: Payer: Self-pay

## 2020-05-11 ENCOUNTER — Emergency Department (HOSPITAL_COMMUNITY): Payer: Medicare Other

## 2020-05-11 ENCOUNTER — Encounter (HOSPITAL_COMMUNITY): Payer: Self-pay

## 2020-05-11 DIAGNOSIS — Z79899 Other long term (current) drug therapy: Secondary | ICD-10-CM | POA: Diagnosis not present

## 2020-05-11 DIAGNOSIS — M79661 Pain in right lower leg: Secondary | ICD-10-CM | POA: Diagnosis not present

## 2020-05-11 DIAGNOSIS — I129 Hypertensive chronic kidney disease with stage 1 through stage 4 chronic kidney disease, or unspecified chronic kidney disease: Secondary | ICD-10-CM | POA: Insufficient documentation

## 2020-05-11 DIAGNOSIS — E039 Hypothyroidism, unspecified: Secondary | ICD-10-CM | POA: Insufficient documentation

## 2020-05-11 DIAGNOSIS — Z8616 Personal history of COVID-19: Secondary | ICD-10-CM | POA: Diagnosis not present

## 2020-05-11 DIAGNOSIS — Z794 Long term (current) use of insulin: Secondary | ICD-10-CM | POA: Insufficient documentation

## 2020-05-11 DIAGNOSIS — Z96652 Presence of left artificial knee joint: Secondary | ICD-10-CM | POA: Diagnosis not present

## 2020-05-11 DIAGNOSIS — E1142 Type 2 diabetes mellitus with diabetic polyneuropathy: Secondary | ICD-10-CM | POA: Diagnosis not present

## 2020-05-11 DIAGNOSIS — N183 Chronic kidney disease, stage 3 unspecified: Secondary | ICD-10-CM | POA: Diagnosis not present

## 2020-05-11 DIAGNOSIS — M79604 Pain in right leg: Secondary | ICD-10-CM

## 2020-05-11 LAB — BASIC METABOLIC PANEL
Anion gap: 9 (ref 5–15)
BUN: 23 mg/dL (ref 8–23)
CO2: 27 mmol/L (ref 22–32)
Calcium: 9.6 mg/dL (ref 8.9–10.3)
Chloride: 103 mmol/L (ref 98–111)
Creatinine, Ser: 1.32 mg/dL — ABNORMAL HIGH (ref 0.44–1.00)
GFR, Estimated: 43 mL/min — ABNORMAL LOW (ref >60)
Glucose, Bld: 169 mg/dL — ABNORMAL HIGH (ref 70–99)
Potassium: 4.5 mmol/L (ref 3.5–5.1)
Sodium: 139 mmol/L (ref 135–145)

## 2020-05-11 LAB — CBC
HCT: 43.8 % (ref 36.0–46.0)
Hemoglobin: 13.7 g/dL (ref 12.0–15.0)
MCH: 31.3 pg (ref 26.0–34.0)
MCHC: 31.3 g/dL (ref 30.0–36.0)
MCV: 100 fL (ref 80.0–100.0)
Platelets: 238 10*3/uL (ref 150–400)
RBC: 4.38 MIL/uL (ref 3.87–5.11)
RDW: 12.7 % (ref 11.5–15.5)
WBC: 7.8 10*3/uL (ref 4.0–10.5)
nRBC: 0 % (ref 0.0–0.2)

## 2020-05-11 LAB — MAGNESIUM: Magnesium: 1.8 mg/dL (ref 1.7–2.4)

## 2020-05-11 MED ORDER — LIDOCAINE 5 % EX PTCH
1.0000 | MEDICATED_PATCH | CUTANEOUS | Status: DC
  Filled 2020-05-11: qty 1

## 2020-05-11 MED ORDER — METHOCARBAMOL 500 MG PO TABS: 500.0000 mg | ORAL_TABLET | Freq: Three times a day (TID) | ORAL | 0 refills | Status: DC | PRN

## 2020-05-11 MED ORDER — METHOCARBAMOL 500 MG PO TABS
500.0000 mg | ORAL_TABLET | Freq: Once | ORAL | Status: DC
  Filled 2020-05-11: qty 1

## 2020-05-11 MED ORDER — LIDOCAINE 4 % EX PTCH: 1.0000 | MEDICATED_PATCH | Freq: Every day | CUTANEOUS | 0 refills | Status: DC | PRN

## 2020-05-11 NOTE — Discharge Instructions (Addendum)
Follow-up via PCP and pain management.  Take the new medicines to see if they will help.

## 2020-05-11 NOTE — ED Triage Notes (Addendum)
Pt BIB EMS from home with c/o Right foot pain 10/10. Pt reports she had surgery back in aug.

## 2020-05-11 NOTE — ED Provider Notes (Signed)
[facility] EMERGENCY DEPARTMENT Provider Note   CSN: [id number] Arrival date & time: 05/11/20  1432     History Chief Complaint  Patient presents with  . Foot Pain    Toni Hill is a 74 y.o. female.  HPI Patient presents with right foot and lower extremity pain.  Has been going for months.  Has been seeing podiatry and Dr. Erlinda Hong for this.  States pain uncontrolled.  States she had been on some medicine that started with an L.  States she also been given a muscle relaxer.  States they were not working.  States the pain is bad where she now has trouble moving the leg.  Does not come from the back.  States now hurting in the hip also.  No trauma.  Has a history of neuropathy.  Also history of fibromyalgia.    Past Medical History:  Diagnosis Date  . Bilateral lower extremity edema    right > left  . Bipolar 1 disorder ([place])   . CKD (chronic kidney disease) stage 3, GFR 30-59 ml/min (HCC)     Dr Lawson Radar, [facility]  . Diabetes, polyneuropathy ([place])    FEET AND FINGER TIPS  . Fibromyalgia   . GERD (gastroesophageal reflux disease)   . History of chronic gastritis   . History of COVID-24 Aug 2019  . Hyperlipidemia   . Hypertension    per pt was take off bp medication because of kidney disease told by her nephrologist  . Hypothyroidism   . Mixed incontinence urge and stress   . OA (osteoarthritis)    KNEES  . OAB (overactive bladder)   . PONV (postoperative nausea and vomiting)   . 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 Type 2 diabetes mellitus treated with insulin ([place])   . Wears glasses     Patient Active Problem List   Diagnosis Date Noted  . Painful orthopaedic hardware ([place]) 01/29/2020  . Protein-calorie malnutrition, severe 01/04/2020  . Diarrhea 01/04/2020  . Intractable nausea and vomiting 01/02/2020  . Nausea & vomiting 01/01/2020  . Preoperative evaluation to rule out surgical contraindication 11/19/2019  . Bilateral  low back pain with sciatica 09/06/2019  . Peripheral neuropathy 09/06/2019  . Seborrheic keratoses 08/20/2019  . Left breast mass 08/20/2019  . History of left knee replacement 05/29/2019  . Macrocytic anemia 03/08/2019  . UTI (urinary tract infection) 03/04/2019  . Pain of left calf 01/26/2019  . Acute kidney injury ([place]) 01/25/2019  . Leg swelling 01/25/2019  . QT prolongation 01/25/2019  . Elevated d-dimer 01/23/2019  . Acute blood loss as cause of postoperative anemia 01/23/2019  . Cellulitis of leg, left 01/23/2019  . At risk for adverse drug event 01/22/2019  . Bipolar 1 disorder ([place])   . 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 Osteoarthritis of left knee 12/30/2018  . Anxiety 12/30/2018  . Stage 3 chronic kidney disease 06/28/2016  . Hypercholesterolemia 08/20/2015  . Type 2 diabetes mellitus ([place]) 02/28/2014  . Esophageal reflux 02/28/2014  . Stress incontinence, female 02/28/2014  . Hypothyroidism (acquired) 02/28/2014  . Essential hypertension 11/17/2011    Past Surgical History:  Procedure Laterality Date  . CHOLECYSTECTOMY OPEN  1990's  . COLONOSCOPY  last one 07-17-2014  . CYSTOSCOPY WITH INJECTION N/A 10/29/2016   Procedure: CYSTOSCOPY WITH INJECTION BOTOX 100 UNITS, urethral dilation;  Surgeon: Carolan Clines, MD;  Location: [facility];  Service: Urology;  Laterality: N/A;  . ESOPHAGOGASTRODUODENOSCOPY  last one 07-01-2016  . EXCISIONAL BREAST BX  1990   BENIGN  . HARDWARE REMOVAL Right 03/05/2020   Procedure: HARDWARE REMOVAL RIGHT ANKLE;  Surgeon: Leandrew Koyanagi, MD;  Location: [facility];  Service: Orthopedics;  Laterality: Right;  . ORIF TIBIA & FIBULA FRACTURES  2010   retained rod  . TONSILLECTOMY  age 51  . TOTAL KNEE ARTHROPLASTY Left 01/15/2019   Procedure: LEFT TOTAL KNEE ARTHROPLASTY;  Surgeon: Leandrew Koyanagi, MD;  Location: [facility];  Service: Orthopedics;  Laterality: Left;  Marland Kitchen VAGINAL HYSTERECTOMY  1979    . VENTRAL HERNIA REPAIR  69's     OB History    Gravida  2   Para  2   Term      Preterm      AB      Living  2     SAB      TAB      Ectopic      Multiple      Live Births              Family History  Problem Relation Age of Onset  . Breast cancer Mother 52  . Cancer Father        ? type  . Breast cancer Sister 65  . Breast cancer Maternal Grandmother        ? age    Social History   Tobacco Use  . Smoking status: Never Smoker  . Smokeless tobacco: Never Used  Vaping Use  . Vaping Use: Never used  Substance Use Topics  . Alcohol use: No  . Drug use: No    Home Medications Prior to Admission medications   Medication Sig Start Date End Date Taking? Authorizing Provider  amLODipine (NORVASC) 5 MG tablet Take 5 mg by mouth daily. 05/03/19   [provider]  Blood Glucose Monitoring Suppl (ONETOUCH VERIO REFLECT) w/Device KIT Check blood sugar 3 times a day 03/14/20   Maudie Mercury, MD  Calcium Carb-Cholecalciferol (CALCIUM 600/VITAMIN D3) 600-800 MG-UNIT TABS Take 1 tablet by mouth daily.     [provider]  Cholecalciferol (EQL VITAMIN D3) 50 MCG (2000 UT) CAPS Take 2,000 Units by mouth daily.     [provider]  ezetimibe (ZETIA) 10 MG tablet TAKE 1 TABLET BY MOUTH EVERY DAY IN THE EVENING 04/28/20   Marianna Payment, MD  gabapentin (NEURONTIN) 300 MG capsule TAKE 1 CAPSULE (300 MG TOTAL) BY MOUTH DAILY AS NEEDED (PAIN AND NEUROPATHY). 05/01/20   Marianna Payment, MD  glucose blood [facility] VERIO) test strip Check blood sugar 3 times a day 03/14/20   Maudie Mercury, MD  HYDROcodone-acetaminophen [facility]) 5-325 MG tablet Take 1-2 tablets by mouth 2 (two) times daily as needed. 03/05/20   Leandrew Koyanagi, MD  hydrocortisone 2.5 % cream Apply 1 application topically 2 (two) times daily.     [provider]  insulin detemir (LEVEMIR) 100 unit/ml SOLN Inject 0.15 mLs (15 Units total) into the skin daily. 15 units nightly,  aim to keep CBG between 140-180. 01/11/20 04/10/20  Marianna Payment, MD  Insulin Pen Needle (B-D ULTRAFINE III SHORT PEN) 31G X 8 MM MISC 1 each by Does not apply route daily. Diagnosis code: E11.9. Used to inject insulin daily. 02/20/20   Asencion Noble, MD  levothyroxine (SYNTHROID) 100 MCG tablet Take 1 tablet (100 mcg total) by mouth daily. 01/06/20 01/05/21  Asencion Noble, MD  Lidocaine (HM LIDOCAINE PATCH) 4 % PTCH Apply 1 patch topically daily as needed. Apply as needed once a day for up to 12 hours. 05/11/20   Davonna Belling, MD  methocarbamol (ROBAXIN) 500 MG tablet Take 1 tablet (500 mg total) by mouth every 8 (eight) hours as needed for muscle spasms. 05/11/20   Davonna Belling, MD  ondansetron (ZOFRAN) 4 MG tablet Take 1 tablet (4 mg total) by mouth 2 (two) times daily. 11/30/19   Maudie Mercury, MD  OneTouch Delica Lancets 01U MISC Check blood sugar 3 times a day 03/14/20   Maudie Mercury, MD  pantoprazole (PROTONIX) 40 MG tablet TAKE 1 TABLET BY MOUTH EVERY DAY 01/10/20   Marianna Payment, MD  sertraline (ZOLOFT) 100 MG tablet TAKE 1 TABLET BY MOUTH EVERY DAY Patient taking differently: Take 100 mg by mouth in the morning.  12/10/19   Axel Filler, MD  simvastatin (ZOCOR) 40 MG tablet Take 1 tablet (40 mg total) by mouth every evening. 10/25/19   Maudie Mercury, MD  vitamin B-12 (CYANOCOBALAMIN) 500 MCG tablet Take 1,000 mcg by mouth in the morning and at bedtime.     [provider]    Allergies    Bactrim [sulfamethoxazole-trimethoprim], Cephalexin, Morphine and related, Vioxx [rofecoxib], and Penicillins  Review of Systems   Review of Systems  Constitutional: Negative for appetite change.  HENT: Negative for congestion.   Respiratory: Positive for shortness of breath.   Cardiovascular: Negative for chest pain.  Gastrointestinal: Negative for abdominal pain.  Genitourinary: Negative for flank pain.  Musculoskeletal:       Pain in right lower extremity.  Skin:  Negative for rash.  Neurological: Negative for weakness.    Physical Exam Updated Vital Signs BP (!) 167/68 (BP Location: Left Arm)   Pulse 79   Temp 98.2 F (36.8 C) (Oral)   Resp 18   SpO2 97%   Physical Exam Vitals reviewed.  Constitutional:      Appearance: Normal appearance.  HENT:     Head: Atraumatic.  Eyes:     Extraocular Movements: Extraocular movements intact.  Cardiovascular:     Rate and Rhythm: Regular rhythm.     Heart sounds: Normal heart sounds.  Pulmonary:     Breath sounds: No rhonchi.  Abdominal:     Tenderness: There is no abdominal tenderness.  Musculoskeletal:     Cervical back: Neck supple.     Comments: Variable tenderness over right lower extremity.  Sometimes tender to palpation sometimes not.  Patient states she cannot move the extremity but sometimes will move it spontaneously.  Does appear to have hammertoes over the toes on the right foot.  Sometimes can flex and extend at the ankle and sometimes will not.  Some tenderness laterally over the right hip.  Some tightness of the musculature medially on the leg.  Pulse intact in right foot.  Temperature of both feet is symmetric.  Skin:    General: Skin is warm.     Capillary Refill: Capillary refill takes less than 2 seconds.  Neurological:     Mental Status: She is oriented to person, place, and time.  Psychiatric:     Comments: Patient is tearful.     ED Results / Procedures / Treatments   Labs (all labs ordered are listed, but only abnormal results are displayed) Labs Reviewed  BASIC METABOLIC PANEL - Abnormal; Notable for the following components:      Result Value   Glucose, Bld 169 (*)  Creatinine, Ser 1.32 (*)    GFR, Estimated 43 (*)    All other components within normal limits  MAGNESIUM  CBC    EKG None  Radiology DG Ankle Complete Right  Result Date: 05/11/2020 CLINICAL DATA:  Ankle pain EXAM: RIGHT ANKLE - COMPLETE 3+ VIEW COMPARISON:  None. FINDINGS: There is no  evidence of fracture, dislocation, or joint effusion. The patient is status post ORIF with intramedullary rod fixation of the distal tibia. There is a healed fracture deformity of distal fibula. No periprosthetic lucency is identified. There is diffuse osteopenia. No focal soft tissue swelling. IMPRESSION: Status post ORIF with healed fracture deformity of the distal tibia and fibula. No acute osseous abnormality. Electronically Signed   By: Prudencio Pair M.D.   On: 05/11/2020 17:47   DG Hip Unilat W or Wo Pelvis 2-3 Views Right  Result Date: 05/11/2020 CLINICAL DATA:  RIGHT hip pain post fall EXAM: DG HIP (WITH OR WITHOUT PELVIS) 2-3V RIGHT COMPARISON:  09/20/2018 FINDINGS: Osseous demineralization. Hip and SI joint spaces preserved. No acute fracture, dislocation, or bone destruction. Mild degenerative disc disease changes at visualized lumbar spine. Numerous pelvic phleboliths. IMPRESSION: No acute osseous abnormalities. Electronically Signed   By: Lavonia Dana M.D.   On: 05/11/2020 16:35    Procedures Procedures (including critical care time)  Medications Ordered in ED Medications  methocarbamol (ROBAXIN) tablet 500 mg (has no administration in time range)  lidocaine (LIDODERM) 5 % 1 patch (has no administration in time range)    ED Course  I have reviewed the triage vital signs and the nursing notes.  Pertinent labs & imaging results that were available during my care of the patient were reviewed by me and considered in my medical decision making (see chart for details).    MDM Rules/Calculators/A&P                         Patient with acute on chronic pain of right foot and lower extremity.  Reviewed records.  Recently been seen by Ortho for the same.  States they had removed some screws from the foot.  They had been on giving her Norco.  Then transition down to Ultram without relief.  Now states she needs pain meds.  She called the office and they said she would have to get them through  a pain clinic.  Patient does have good blood flow in the foot.  Does not appear to be an ischemic foot.  Does have history of neuropathy.  Doubt DVT.  There is no swelling in the leg.  Does have a muscle spasms at times.  Had been on baclofen.  Will try Robaxin instead.  Also will try a lidocaine patch.  Patient is tearful.  Had said that she feels like shooting herself but when questioned on this she said no she is just in a lot of pain.  States she will not go back to Dr.Xu.  Because he said that she is a drug he.  I informed her that he had not said this but just that she needed to go to pain management for best control of her pain.  With them not wanting to control her acute on chronic pain we will not be prescribing narcotics but will do Robaxin and lidocaine patches.  Outpatient follow-up with pain management.  Does not appear to have ischemic foot.  No infection seen.  Doubt DVT or arterial occlusion.  Final Clinical Impression(s) / ED  Diagnoses Final diagnoses:  Pain of right lower extremity    Rx / DC Orders ED Discharge Orders         Ordered    methocarbamol (ROBAXIN) 500 MG tablet  Every 8 hours PRN        05/11/20 1840    Lidocaine (HM LIDOCAINE PATCH) 4 % PTCH  Daily PRN        05/11/20 1840           Davonna Belling, MD 05/11/20 1840

## 2020-05-11 NOTE — ED Notes (Signed)
Discharge instructions discussed with pt. Pt verbalized understanding. Pt stable and ambulatory. No signature pad available. 

## 2020-05-24 ENCOUNTER — Encounter: Payer: Self-pay | Admitting: Internal Medicine

## 2020-05-26 ENCOUNTER — Telehealth: Payer: Self-pay | Admitting: Dietician

## 2020-05-26 ENCOUNTER — Ambulatory Visit: Payer: Medicare Other | Admitting: Student

## 2020-05-26 ENCOUNTER — Other Ambulatory Visit: Payer: Self-pay

## 2020-05-26 ENCOUNTER — Telehealth: Payer: Self-pay | Admitting: *Deleted

## 2020-05-26 DIAGNOSIS — I824Y1 Acute embolism and thrombosis of unspecified deep veins of right proximal lower extremity: Secondary | ICD-10-CM | POA: Insufficient documentation

## 2020-05-26 NOTE — Telephone Encounter (Signed)
Calling for an appointment, call transferred to front office.

## 2020-05-26 NOTE — Telephone Encounter (Signed)
Call from pt stating she went to [facility] on Thursday of last week and was dx with a blood clot in her right groin. She was put on a blood thinner and they wrapped her leg in ace bandages. Now she's c/o severe pain ; she cannot move her leg w/o having pain. Denies sob and leg being red/swollen. States her groin looks a "little larger".  Offer pt an appt for today; states she's unable to come in. States she wants something for the pain beside gabapentin and tylenol.  Discuss scheduling a telehealth appt - she's agreeable. Appt for today @ 1215 PM with Dr Allyson Sabal.

## 2020-05-26 NOTE — Assessment & Plan Note (Signed)
Patient reports that she went to the ED and was found to have a blood clot in her right leg. She reports that she was placed on a blood thinner (eliquis). States that she is in a lot of pain and cannot move her leg, someone has to move it for her at home. Reports that pain is located in her right groin and right leg. States that her daughter saw her right leg and told her that she has bruises all over it. Denies chest pain, shortness of breath, swelling/redness in right leg. She reports that she has only been taking gabapentin and tylenol at home but they have not been helping to ease the pain. She recently increased her dose of gabapentin but this still is not helping.   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 She was started on eliquis and told to wrap her legs with ACE wraps or compression stockings. She is on appropriate therapy for the DVT, however it appears that her pain is not well controlled with her current medications. Advised her to come in to the clinic so that we can evaluate this in person as patient's pain appears to be out of proportion to the ultrasound findings of her DVT. However, patient reports that she is unable to come in to the clinic because she does not have transportation at this time. However, she reports that she may be able to come in tomorrow, so will have the nursing staff schedule an in-person visit with me for tomorrow.  Plan: -continue eliquis  -f/u for in-person visit tomorrow

## 2020-05-26 NOTE — Progress Notes (Signed)
[facility] Internal Medicine Residency Telephone Encounter Continuity Care Appointment  HPI:   This telephone encounter was created for [name] on 05/26/2020 for the following purpose/cc: right leg pain from chronic-appearing DVT.  Patient reports that she went to the ED and was found to have a blood clot in her right leg. She reports that she was placed on a blood thinner (eliquis). States that she is in a lot of pain and cannot move her leg, someone has to move it for her at home. Reports that pain is located in her right groin and right leg. States that her daughter saw her right leg and told her that she has bruises all over it. Denies chest pain, shortness of breath, swelling/redness in right leg.  She reports that she has only been taking gabapentin and tylenol at home but they have not been helping to ease the pain. She recently increased her dose of gabapentin but this still is not helping.   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 She was started on eliquis and told to wrap her legs with ACE wraps or compression stockings. She is on appropriate therapy for the DVT, however it appears that her pain is not well controlled with her current medications.  Advised her to come in to the clinic so that we can evaluate this in person as patient's pain appears to be out of proportion to the ultrasound findings of her DVT. However, patient reports that she is unable to come in to the clinic because she does not have transportation at this time. However, she reports that she may be able to come in tomorrow, so will have the nursing staff schedule an in-person visit with me for tomorrow.   Past Medical History:  Past Medical History:  Diagnosis Date  . Bilateral lower extremity edema    right > left  . Bipolar 1 disorder ([place])   . CKD (chronic kidney disease) stage 3, GFR 30-59 ml/min (HCC)     Dr Lawson Radar, [facility]   . Diabetes, polyneuropathy ([place])    FEET AND FINGER TIPS  . Fibromyalgia   . GERD (gastroesophageal reflux disease)   . History of chronic gastritis   . History of COVID-24 Aug 2019  . Hyperlipidemia   . Hypertension    per pt was take off bp medication because of kidney disease told by her nephrologist  . Hypothyroidism   . Mixed incontinence urge and stress   . OA (osteoarthritis)    KNEES  . OAB (overactive bladder)   . PONV (postoperative nausea and vomiting)   . 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 Type 2 diabetes mellitus treated with insulin ([place])   . Wears glasses       ROS:   + for pain in right groin and right leg   Assessment / Plan / Recommendations:   Please see A&P under problem oriented charting for assessment of the patient's acute and chronic medical conditions.   As always, pt is advised that if symptoms worsen or new symptoms arise, they should go to an urgent care facility or to to ER for further evaluation.   Consent and Medical Decision Making:   Patient discussed with Dr. Evette Doffing  This is a telephone encounter between Toni Hill and Virl Axe on 05/26/2020 for ED f/u of blood clot in right leg. The visit was conducted  with the patient located at home and Virl Axe at [facility]. The patient's identity was confirmed using their DOB and current address. The patient has consented to being evaluated through a telephone encounter and understands the associated risks (an examination cannot be done and the patient may need to come in for an appointment) / benefits (allows the patient to remain at home, decreasing exposure to coronavirus). I personally spent 18 minutes on medical discussion.

## 2020-05-27 ENCOUNTER — Encounter: Payer: Self-pay | Admitting: Student

## 2020-05-27 ENCOUNTER — Ambulatory Visit (INDEPENDENT_AMBULATORY_CARE_PROVIDER_SITE_OTHER): Payer: Medicare Other | Admitting: Student

## 2020-05-27 VITALS — BP 126/69 | HR 83 | Temp 98.2°F | Ht 69.0 in

## 2020-05-27 DIAGNOSIS — I825Y1 Chronic embolism and thrombosis of unspecified deep veins of right proximal lower extremity: Secondary | ICD-10-CM | POA: Diagnosis not present

## 2020-05-27 DIAGNOSIS — M5441 Lumbago with sciatica, right side: Secondary | ICD-10-CM

## 2020-05-27 NOTE — Progress Notes (Signed)
Internal Medicine Clinic Attending  Case discussed with Dr. Allyson Sabal  At the time of the visit.  We reviewed the resident's history and pertinent patient test results.  I agree with the assessment, diagnosis, and plan of care documented in the resident's note. Will need in person exam to better delineate the cause of this pain, seems unlikely that it is entirely due the small chronic thrombus seen on ultrasound.

## 2020-05-27 NOTE — Assessment & Plan Note (Signed)
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 She was complaining of right leg pain from right hip down to right foot, which has been ongoing for past couple of months and progressed to the point where she has been unable to move her leg for past 3 weeks. She went to see her podiatrist who referred her to the ED, where she was found to have negative hip x-rays but ultrasound showed chronic appearing DVT at the junction of her femoral vein and profundus. She was sent home with eliquis and compression bandage. Today, she is reporting that she is still continuing to experience pain from right low back/hip to right foot and is still unable to move her right leg. She did mention that she saw her endocrinologist yesterday, who increased her gabapentin dose to 600mg  BID and that this has been helping a lot with her pain, but the pain is still there. We discussed the possibility of this being pain from the DVT vs post-thrombotic syndrome vs neuropathic pain from peripheral neuropathy vs low back pain with sciatica. Due to her progressive neurological deficit with her low back/hip pain and age >48, we will obtain a MRI with and without contrast.   Plan: -continue gabapentin 600mg  BID -continue eliquis and compression bandages -f/u MRI with and without contrast

## 2020-05-27 NOTE — Patient Instructions (Addendum)
Ms Schumpert,  It was a pleasure seeing you in the clinic today.   We discussed the following today:  1. Please continue taking the eliquis as directed. Also continue using the compression bandages constantly.  2. Please give the gabapentin more time to work.  3. Someone will call you to schedule the MRI of your back. I will call you when I have the results of the MRI.  Please call our clinic at [phone] if you have any questions or concerns. The best time to call is Monday-Friday from 9am-4pm, but there is someone available 24/7 at the same number. If you need medication refills, please notify your pharmacy one week in advance and they will send [place] a request.   Thank you for letting us take part in your care. We look forward to seeing you next time!

## 2020-05-27 NOTE — Progress Notes (Signed)
   CC: right leg pain  HPI:  Toni Hill is a 74 y.o. female with history listed below who presented to the [facility] for right leg pain. Please see individualized A&P for full HPI.  Past Medical History:  Diagnosis Date  . Bilateral lower extremity edema    right > left  . Bipolar 1 disorder ([place])   . CKD (chronic kidney disease) stage 3, GFR 30-59 ml/min (HCC)     Dr Lawson Radar, [facility]  . Diabetes, polyneuropathy ([place])    FEET AND FINGER TIPS  . Fibromyalgia   . GERD (gastroesophageal reflux disease)   . History of chronic gastritis   . History of COVID-24 Aug 2019  . Hyperlipidemia   . Hypertension    per pt was take off bp medication because of kidney disease told by her nephrologist  . Hypothyroidism   . Mixed incontinence urge and stress   . OA (osteoarthritis)    KNEES  . OAB (overactive bladder)   . PONV (postoperative nausea and vomiting)   . 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 Type 2 diabetes mellitus treated with insulin ([place])   . Wears glasses    Review of Systems:   + for right leg pain and immobility   Negative aside from that listed in the individualized A&P.  Physical Exam:  Vitals:   05/27/20 1525  BP: 126/69  Pulse: 83  Temp: 98.2 F (36.8 C)  TempSrc: Oral  SpO2: 99%  Height: 5\' 9"  (1.753 m)   Physical Exam Constitutional:      Appearance: Normal appearance. She is not ill-appearing.  HENT:     Head: Normocephalic and atraumatic.     Mouth/Throat:     Mouth: Mucous membranes are moist.     Pharynx: Oropharynx is clear.  Eyes:     Extraocular Movements: Extraocular movements intact.     Conjunctiva/sclera: Conjunctivae normal.     Pupils: Pupils are equal, round, and reactive to light.  Cardiovascular:     Rate and Rhythm: Normal rate and regular rhythm.     Pulses: Normal pulses.     Heart sounds: Normal heart sounds. No murmur heard.  No friction rub. No gallop.   Pulmonary:     Effort: Pulmonary effort  is normal.     Breath sounds: Normal breath sounds. No wheezing, rhonchi or rales.  Abdominal:     General: Bowel sounds are normal. There is no distension.     Palpations: Abdomen is soft.     Tenderness: There is no abdominal tenderness.  Musculoskeletal:        General: No tenderness.     Cervical back: Normal range of motion.     Comments: Right leg is immobile. Significant pain with passive movement of right leg. Right leg from knee to ankle wrapped in compression bandage. 1+ non-pitting edema in right leg.  Skin:    General: Skin is warm and dry.  Neurological:     Mental Status: She is alert and oriented to person, place, and time.     Motor: Weakness present.     Comments:  No sensation in bilateral feet up to ankles.  Psychiatric:        Mood and Affect: Mood normal.        Behavior: Behavior normal.      Assessment & Plan:   See Encounters Tab for problem based charting.  Patient seen with Dr. Philipp Ovens

## 2020-06-02 NOTE — Progress Notes (Addendum)
Internal Medicine Clinic Attending  I saw and evaluated the patient.  I personally confirmed the key portions of the history and exam documented by Dr. Allyson Sabal and I reviewed pertinent patient test results.  The assessment, diagnosis, and plan were formulated together and I agree with the documentation in the resident's note.   Patient here today with complaint of right leg pain. She developed sudden onset pain in her right hip / upper thigh with the inability to move that leg about three weeks ago. She reports frequent falls, but denies a fall at that time. She was seen in the ED and had xrays which ruled out a hip fracture. 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 She is here today for persistent pain. She is still unable to move her right leg. Neurological exam is challenging. She is in a wheelchair and has her right leg propped on a chair. She has normal 5/5 strength of her left lower extremity but says she is unable to move her right leg at all. She is unable to flex her hip, bend her knee, or plantar or dorsiflex her foot. It is difficult to determine if this is true paralysis or lack of effort. She has chronic diabetic polyneuropathy so sensation is diminished in both lower extremities. Given her chronic low back pain, we have recommended MRI lumbar spine to rule out a spinal cord compression causing pain and weakness.

## 2020-06-04 ENCOUNTER — Telehealth: Payer: Self-pay

## 2020-06-04 NOTE — Telephone Encounter (Signed)
Requesting order for bed pan and bedside commode. Please call pt back.

## 2020-06-05 ENCOUNTER — Encounter: Payer: Self-pay | Admitting: Internal Medicine

## 2020-06-05 ENCOUNTER — Ambulatory Visit (INDEPENDENT_AMBULATORY_CARE_PROVIDER_SITE_OTHER): Payer: Medicare Other | Admitting: Internal Medicine

## 2020-06-05 VITALS — BP 114/58 | HR 83 | Temp 97.9°F | Ht 69.0 in

## 2020-06-05 DIAGNOSIS — I825Y1 Chronic embolism and thrombosis of unspecified deep veins of right proximal lower extremity: Secondary | ICD-10-CM

## 2020-06-05 DIAGNOSIS — G6289 Other specified polyneuropathies: Secondary | ICD-10-CM | POA: Diagnosis not present

## 2020-06-05 DIAGNOSIS — M5440 Lumbago with sciatica, unspecified side: Secondary | ICD-10-CM | POA: Diagnosis not present

## 2020-06-05 DIAGNOSIS — K5900 Constipation, unspecified: Secondary | ICD-10-CM

## 2020-06-05 DIAGNOSIS — R1013 Epigastric pain: Secondary | ICD-10-CM

## 2020-06-05 MED ORDER — SENNOSIDES-DOCUSATE SODIUM 8.6-50 MG PO TABS: 1.0000 | ORAL_TABLET | Freq: Every day | ORAL | 0 refills | Status: DC

## 2020-06-05 MED ORDER — POLYETHYLENE GLYCOL 3350 17 G PO PACK: 17.0000 g | PACK | Freq: Every day | ORAL | 0 refills | Status: DC

## 2020-06-05 MED ORDER — GABAPENTIN 300 MG PO CAPS: 300.0000 mg | ORAL_CAPSULE | Freq: Two times a day (BID) | ORAL | 5 refills | Status: DC

## 2020-06-05 NOTE — Progress Notes (Signed)
   CC: Constipation and right lower leg pain  HPI:  [name] is a 74 y.o. with the history listed below presenting for concerns about constipation and right lower leg pain.  Past Medical History:  Diagnosis Date  . Bilateral lower extremity edema    right > left  . Bipolar 1 disorder ([place])   . CKD (chronic kidney disease) stage 3, GFR 30-59 ml/min (HCC)     Dr Lawson Radar, [facility]  . Diabetes, polyneuropathy ([place])    FEET AND FINGER TIPS  . Fibromyalgia   . GERD (gastroesophageal reflux disease)   . History of chronic gastritis   . History of COVID-24 Aug 2019  . Hyperlipidemia   . Hypertension    per pt was take off bp medication because of kidney disease told by her nephrologist  . Hypothyroidism   . Mixed incontinence urge and stress   . OA (osteoarthritis)    KNEES  . OAB (overactive bladder)   . PONV (postoperative nausea and vomiting)   . 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 Type 2 diabetes mellitus treated with insulin ([place])   . Wears glasses    Review of Systems:   Constitutional: Negative for chills and fever.  Respiratory: Negative for shortness of breath.   Cardiovascular: Negative for chest pain and leg swelling.  Gastrointestinal: Negative for abdominal pain, nausea and vomiting. Positive for no bowel movements x4-6 weeks.  Neurological: Negative for dizziness and headaches.   Physical Exam:  Vitals:   06/05/20 1535  BP: (!) 114/58  Pulse: 83  Temp: 97.9 F (36.6 C)  TempSrc: Oral  SpO2: 95%  Height: 5\' 9"  (1.753 m)   Physical Exam Constitutional:      Appearance: She is not toxic-appearing.  HENT:     Head: Normocephalic and atraumatic.     Mouth/Throat:     Mouth: Mucous membranes are moist.     Pharynx: Oropharynx is clear.  Eyes:     Extraocular Movements: Extraocular movements intact.     Pupils: Pupils are equal, round, and reactive to light.  Cardiovascular:     Rate and Rhythm: Normal rate and regular  rhythm.     Pulses: Normal pulses.     Heart sounds: Normal heart sounds.  Pulmonary:     Effort: Pulmonary effort is normal. No respiratory distress.     Breath sounds: Normal breath sounds.  Abdominal:     General: Abdomen is flat. Bowel sounds are normal.     Palpations: Abdomen is soft.     Tenderness: There is abdominal tenderness (mild TTP over epigastric area). There is no guarding or rebound.  Skin:    General: Skin is warm and dry.     Capillary Refill: Capillary refill takes less than 2 seconds.  Neurological:     Mental Status: She is alert and oriented to person, place, and time.     Comments: 5/5 strength in left lower extremity. 0/5 strength in right lower extremity, pain of entire leg with passive movement of right lower extremity, sensation to light touch intact  Psychiatric:        Mood and Affect: Mood normal.     Comments: Anxious, tearful      Assessment & Plan:   See Encounters Tab for problem based charting.  Patient discussed with Dr. Rebeca Alert

## 2020-06-05 NOTE — Patient Instructions (Addendum)
[name],  It was a pleasure to see you today. Thank you for coming in.   Today we discussed your pain. Please increase your gabapentin ot 300 mg twice a day. You can continue taking tylenol. We need to obtain the MRI to further evaluate this.   We also discussed your bowel movements. I am checking some labs today and will contact you if they are abnormal. Please start taking miralax daily as needed. The goal is to have a bowel movement once per day.   Please return to clinic 3 months or as needed.   Thank you again for coming in.   Asencion Noble.D.

## 2020-06-06 DIAGNOSIS — K5900 Constipation, unspecified: Secondary | ICD-10-CM

## 2020-06-06 HISTORY — DX: Constipation, unspecified: K59.00

## 2020-06-06 LAB — BMP8+ANION GAP
Anion Gap: 16 mmol/L (ref 10.0–18.0)
BUN/Creatinine Ratio: 26 (ref 12–28)
BUN: 32 mg/dL — ABNORMAL HIGH (ref 8–27)
CO2: 25 mmol/L (ref 20–29)
Calcium: 9.7 mg/dL (ref 8.7–10.3)
Chloride: 99 mmol/L (ref 96–106)
Creatinine, Ser: 1.24 mg/dL — ABNORMAL HIGH (ref 0.57–1.00)
GFR calc Af Amer: 50 mL/min/{1.73_m2} — ABNORMAL LOW (ref >59)
GFR calc non Af Amer: 43 mL/min/{1.73_m2} — ABNORMAL LOW (ref >59)
Glucose: 267 mg/dL — ABNORMAL HIGH (ref 65–99)
Potassium: 4.5 mmol/L (ref 3.5–5.2)
Sodium: 140 mmol/L (ref 134–144)

## 2020-06-06 MED ORDER — GABAPENTIN 300 MG PO CAPS: 600.0000 mg | ORAL_CAPSULE | Freq: Two times a day (BID) | ORAL | 5 refills | Status: DC

## 2020-06-06 NOTE — Assessment & Plan Note (Signed)
Patient reports concern about constipation, she reports that she has not had a bowel movement for 4-6 weeks, she reports that she feels like she needs to go however is not able to. She states that she has been taking miralax twice a day with no relief. She states that she is passing gas. She denies any nausea, vomiting, decreased appetite, weight changes, or other issues. Reports some mild abdominal pain around the epigastric area. She reports that she has been urinating fine.   On exam she has some mild TTP over the epigastric area, abdomen was soft, no guarding, with normoactive bowel sounds noted. Vitals are stable.   Patient reports that she has not had any bowel movements for 4-6 weeks. If this was the case I would expect her to have some other GI changes such as decreased appetite, nausea, vomiting, or other symptoms. She is not on any pain medications or medications that seem to be contributing. Her abdominal exam was reassuring today, had mild TTP over the epigastric area however her vitals were stable and she had no acute abdomen. It's unclear what her symptoms could be related to. Will prescribe a short course of senokot to assist with constipation and check a BMP for electrolyte abnormalities. Advised to continue to monitor.   -BMP -Senokot daily

## 2020-06-06 NOTE — Assessment & Plan Note (Signed)
Patient reports concerns about her right lower extremity pain and weakness. She reports states that it started after having screws removed from her ankle for painful orthopedic hardware. She has significant pain from her hip all the way down to her toes, states it's the whole leg, not the front or the back. It is a 10 out of 10. Reports significant weakness and states that she can't move her leg at all. He has been taking Tylenol 2 tablets in the morning and 2 tablets relief. She reports that she has been taking her gabapentin 600 mg BID which she states does not help. 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 She had hip x-rays during that ED visit that was negative for hip fracture. She was seen about 1.5 weeks ago for similar symptoms and given the progressive neurological deficits noted an MRI was ordered. MRI has not been done yet however has this set up for next week. Advised patient to continue current medications. Initially advised to increase gabapentin to 600 TID however patient has a gfr of 43, continue gabapentin 600 mg BID for now. Advised patient that we need to obtain the MRI to evaluate her spine and assess for nerve compression.   -F/u on MRI -Advised to increase gabapentin to 600 mg TID however patient expressed concern, will continue 600 mg BID for now -Continue eliquis and compression bandages -Obtain BMP today -Continue tylenol for now

## 2020-06-09 ENCOUNTER — Telehealth: Payer: Self-pay

## 2020-06-09 NOTE — Telephone Encounter (Signed)
I returned patient phone call today in reguard to getting a DME order for a Bed Pan metal,and a Beside Commode chair, I would like the doctor to send an order to [facility] in [place]The telephone number is [phone] fax number is [phone].I will send a message to her pcp.

## 2020-06-09 NOTE — Telephone Encounter (Signed)
Agree with this plan.  Thank you! 

## 2020-06-09 NOTE — Telephone Encounter (Signed)
Please call pt she is requesting to speak to nurse.

## 2020-06-09 NOTE — Telephone Encounter (Signed)
Pt states she knows she has a UTI, and needs something for it Also she states her leg is killing her She also needs a bedside commode and a metal bedpan Also she would like to go to a nursing home, either [facility] or [facility] Sending to amber and red team Pt states she is going to die, states she only has a cup to "pee in". She states she needs help now, it is suggested she call 911 for ED eval

## 2020-06-10 ENCOUNTER — Other Ambulatory Visit: Payer: Self-pay | Admitting: Internal Medicine

## 2020-06-10 ENCOUNTER — Other Ambulatory Visit: Payer: Self-pay

## 2020-06-10 ENCOUNTER — Inpatient Hospital Stay (HOSPITAL_COMMUNITY)
Admission: EM | Admit: 2020-06-10 | Discharge: 2020-06-16 | DRG: 880 | Disposition: A | Discharge status: Skilled Nursing Facility | Payer: Medicare Other | Attending: Internal Medicine | Admitting: Internal Medicine

## 2020-06-10 ENCOUNTER — Ambulatory Visit (HOSPITAL_COMMUNITY): Payer: Medicare Other

## 2020-06-10 ENCOUNTER — Encounter (HOSPITAL_COMMUNITY): Payer: Self-pay

## 2020-06-10 ENCOUNTER — Telehealth: Payer: Self-pay | Admitting: *Deleted

## 2020-06-10 DIAGNOSIS — Z794 Long term (current) use of insulin: Secondary | ICD-10-CM

## 2020-06-10 DIAGNOSIS — Z882 Allergy status to sulfonamides status: Secondary | ICD-10-CM

## 2020-06-10 DIAGNOSIS — M25571 Pain in right ankle and joints of right foot: Secondary | ICD-10-CM | POA: Diagnosis not present

## 2020-06-10 DIAGNOSIS — F444 Conversion disorder with motor symptom or deficit: Secondary | ICD-10-CM

## 2020-06-10 DIAGNOSIS — E1142 Type 2 diabetes mellitus with diabetic polyneuropathy: Secondary | ICD-10-CM | POA: Diagnosis present

## 2020-06-10 DIAGNOSIS — Z881 Allergy status to other antibiotic agents status: Secondary | ICD-10-CM

## 2020-06-10 DIAGNOSIS — F419 Anxiety disorder, unspecified: Secondary | ICD-10-CM | POA: Diagnosis present

## 2020-06-10 DIAGNOSIS — M5440 Lumbago with sciatica, unspecified side: Secondary | ICD-10-CM

## 2020-06-10 DIAGNOSIS — Z880 Allergy status to penicillin: Secondary | ICD-10-CM

## 2020-06-10 DIAGNOSIS — Z79899 Other long term (current) drug therapy: Secondary | ICD-10-CM

## 2020-06-10 DIAGNOSIS — N1832 Chronic kidney disease, stage 3b: Secondary | ICD-10-CM | POA: Diagnosis present

## 2020-06-10 DIAGNOSIS — E1122 Type 2 diabetes mellitus with diabetic chronic kidney disease: Secondary | ICD-10-CM | POA: Diagnosis present

## 2020-06-10 DIAGNOSIS — K5900 Constipation, unspecified: Secondary | ICD-10-CM | POA: Diagnosis present

## 2020-06-10 DIAGNOSIS — Z7401 Bed confinement status: Secondary | ICD-10-CM

## 2020-06-10 DIAGNOSIS — Z885 Allergy status to narcotic agent status: Secondary | ICD-10-CM

## 2020-06-10 DIAGNOSIS — Z90710 Acquired absence of both cervix and uterus: Secondary | ICD-10-CM

## 2020-06-10 DIAGNOSIS — I825Y1 Chronic embolism and thrombosis of unspecified deep veins of right proximal lower extremity: Secondary | ICD-10-CM

## 2020-06-10 DIAGNOSIS — R29898 Other symptoms and signs involving the musculoskeletal system: Secondary | ICD-10-CM | POA: Diagnosis present

## 2020-06-10 DIAGNOSIS — E538 Deficiency of other specified B group vitamins: Secondary | ICD-10-CM | POA: Diagnosis present

## 2020-06-10 DIAGNOSIS — R29818 Other symptoms and signs involving the nervous system: Secondary | ICD-10-CM

## 2020-06-10 DIAGNOSIS — Z8616 Personal history of COVID-19: Secondary | ICD-10-CM

## 2020-06-10 DIAGNOSIS — I129 Hypertensive chronic kidney disease with stage 1 through stage 4 chronic kidney disease, or unspecified chronic kidney disease: Secondary | ICD-10-CM | POA: Diagnosis present

## 2020-06-10 DIAGNOSIS — M797 Fibromyalgia: Secondary | ICD-10-CM | POA: Diagnosis present

## 2020-06-10 DIAGNOSIS — K219 Gastro-esophageal reflux disease without esophagitis: Secondary | ICD-10-CM | POA: Diagnosis present

## 2020-06-10 DIAGNOSIS — E039 Hypothyroidism, unspecified: Secondary | ICD-10-CM | POA: Diagnosis present

## 2020-06-10 DIAGNOSIS — Z888 Allergy status to other drugs, medicaments and biological substances status: Secondary | ICD-10-CM

## 2020-06-10 DIAGNOSIS — Z86718 Personal history of other venous thrombosis and embolism: Secondary | ICD-10-CM

## 2020-06-10 DIAGNOSIS — M79604 Pain in right leg: Secondary | ICD-10-CM

## 2020-06-10 DIAGNOSIS — F319 Bipolar disorder, unspecified: Secondary | ICD-10-CM | POA: Diagnosis present

## 2020-06-10 DIAGNOSIS — Z79890 Hormone replacement therapy (postmenopausal): Secondary | ICD-10-CM

## 2020-06-10 DIAGNOSIS — E785 Hyperlipidemia, unspecified: Secondary | ICD-10-CM | POA: Diagnosis present

## 2020-06-10 DIAGNOSIS — Z20822 Contact with and (suspected) exposure to covid-19: Secondary | ICD-10-CM | POA: Diagnosis present

## 2020-06-10 LAB — CBC WITH DIFFERENTIAL/PLATELET
Abs Immature Granulocytes: 0.01 10*3/uL (ref 0.00–0.07)
Basophils Absolute: 0 10*3/uL (ref 0.0–0.1)
Basophils Relative: 1 %
Eosinophils Absolute: 0.1 10*3/uL (ref 0.0–0.5)
Eosinophils Relative: 2 %
HCT: 39.9 % (ref 36.0–46.0)
Hemoglobin: 13 g/dL (ref 12.0–15.0)
Immature Granulocytes: 0 %
Lymphocytes Relative: 30 %
Lymphs Abs: 1.9 10*3/uL (ref 0.7–4.0)
MCH: 32.6 pg (ref 26.0–34.0)
MCHC: 32.6 g/dL (ref 30.0–36.0)
MCV: 100 fL (ref 80.0–100.0)
Monocytes Absolute: 0.6 10*3/uL (ref 0.1–1.0)
Monocytes Relative: 9 %
Neutro Abs: 3.9 10*3/uL (ref 1.7–7.7)
Neutrophils Relative %: 58 %
Platelets: 351 10*3/uL (ref 150–400)
RBC: 3.99 MIL/uL (ref 3.87–5.11)
RDW: 13.2 % (ref 11.5–15.5)
WBC: 6.5 10*3/uL (ref 4.0–10.5)
nRBC: 0 % (ref 0.0–0.2)

## 2020-06-10 LAB — BASIC METABOLIC PANEL
Anion gap: 13 (ref 5–15)
BUN: 23 mg/dL (ref 8–23)
CO2: 28 mmol/L (ref 22–32)
Calcium: 9.9 mg/dL (ref 8.9–10.3)
Chloride: 99 mmol/L (ref 98–111)
Creatinine, Ser: 1.58 mg/dL — ABNORMAL HIGH (ref 0.44–1.00)
GFR, Estimated: 34 mL/min — ABNORMAL LOW (ref >60)
Glucose, Bld: 205 mg/dL — ABNORMAL HIGH (ref 70–99)
Potassium: 4.4 mmol/L (ref 3.5–5.1)
Sodium: 140 mmol/L (ref 135–145)

## 2020-06-10 LAB — PROTIME-INR
INR: 1.3 — ABNORMAL HIGH (ref 0.8–1.2)
Prothrombin Time: 15.2 seconds (ref 11.4–15.2)

## 2020-06-10 LAB — RESP PANEL BY RT-PCR (FLU A&B, COVID) ARPGX2
Influenza A by PCR: NEGATIVE
Influenza B by PCR: NEGATIVE
SARS Coronavirus 2 by RT PCR: NEGATIVE

## 2020-06-10 LAB — CK: Total CK: 95 U/L (ref 38–234)

## 2020-06-10 MED ORDER — HYDROCODONE-ACETAMINOPHEN 5-325 MG PO TABS
2.0000 | ORAL_TABLET | Freq: Once | ORAL | Status: AC
  Administered 2020-06-10: 2 via ORAL
  Filled 2020-06-10: qty 2

## 2020-06-10 NOTE — Telephone Encounter (Signed)
Will place referral. Can triage call and see if she can f/u and go to the ER?

## 2020-06-10 NOTE — Telephone Encounter (Signed)
Patient is currently in [facility] ED. Hubbard Hartshorn, BSN, RN-BC

## 2020-06-10 NOTE — Chronic Care Management (AMB) (Signed)
  Chronic Care Management   Outreach Note  06/10/2020 Name: Toni Hill MRN: [id number] DOB: 02-23-1946  Toni Hill is a 74 y.o. year old female who is a primary care patient of Maudie Mercury, MD. I reached out to [name] by phone today in response to a referral sent by [name]'s PCP, Maudie Mercury, MD.     An unsuccessful telephone outreach was attempted today. The patient was referred to the case management team for assistance with care management and care coordination.   Follow Up Plan: A HIPAA compliant phone message was left for the patient providing contact information and requesting a return call. The care management team will reach out to the patient again over the next 7 days. If patient returns call to provider office, please advise to call [place] at [phone].  [place]

## 2020-06-10 NOTE — ED Provider Notes (Signed)
[facility] EMERGENCY DEPARTMENT Provider Note   CSN: [id number] Arrival date & time: 06/10/20  1439     History Chief Complaint  Patient presents with  . Foot Pain    R foot- chronic problem    Toni Hill is a 74 y.o. female.  She is complaining of pain and inability to move her right leg that is been going on for a few months.  She says it causes her to cry at night the pain is so exquisite.  She is not able to ambulate and needs to be carried to her doctor's appointments.  Her doctors recently identified that she has a DVT in her right leg and have put her on Eliquis.  This was about a week ago.  They have ordered her an MRI but she has not had that yet.  No trauma.  Is taking gabapentin without improvement.  The history is provided by the patient.  Leg Pain Location:  Hip, leg, knee, ankle and foot Time since incident:  2 months Hip location:  R hip Leg location:  R leg, R upper leg and R lower leg Knee location:  R knee Ankle location:  R ankle Foot location:  R foot Pain details:    Quality:  Aching   Severity:  Severe   Onset quality:  Gradual   Timing:  Constant   Progression:  Worsening Chronicity:  New Relieved by:  Nothing Exacerbated by: movement palpation. Ineffective treatments:  Muscle relaxant Associated symptoms: no back pain, no fever and no neck pain        Past Medical History:  Diagnosis Date  . Bilateral lower extremity edema    right > left  . Bipolar 1 disorder ([place])   . CKD (chronic kidney disease) stage 3, GFR 30-59 ml/min (HCC)     Dr Lawson Radar, [facility]  . Diabetes, polyneuropathy ([place])    FEET AND FINGER TIPS  . Fibromyalgia   . GERD (gastroesophageal reflux disease)   . History of chronic gastritis   . History of COVID-24 Aug 2019  . Hyperlipidemia   . Hypertension    per pt was take off bp medication because of kidney disease told by her nephrologist  . Hypothyroidism   . Mixed incontinence  urge and stress   . OA (osteoarthritis)    KNEES  . OAB (overactive bladder)   . PONV (postoperative nausea and vomiting)   . 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 Type 2 diabetes mellitus treated with insulin ([place])   . Wears glasses     Patient Active Problem List   Diagnosis Date Noted  . Constipation 06/06/2020  . Deep vein thrombosis (DVT) of proximal vein of right lower extremity ([place]) 05/26/2020  . Painful orthopaedic hardware ([place]) 01/29/2020  . Protein-calorie malnutrition, severe 01/04/2020  . Diarrhea 01/04/2020  . Intractable nausea and vomiting 01/02/2020  . Nausea & vomiting 01/01/2020  . Preoperative evaluation to rule out surgical contraindication 11/19/2019  . Bilateral low back pain with sciatica 09/06/2019  . Peripheral neuropathy 09/06/2019  . Seborrheic keratoses 08/20/2019  . Left breast mass 08/20/2019  . History of left knee replacement 05/29/2019  . Macrocytic anemia 03/08/2019  . UTI (urinary tract infection) 03/04/2019  . Pain of left calf 01/26/2019  . Acute kidney injury ([place]) 01/25/2019  . Leg swelling 01/25/2019  . QT prolongation 01/25/2019  . Elevated d-dimer 01/23/2019  . Acute blood loss as cause  of postoperative anemia 01/23/2019  . Cellulitis of leg, left 01/23/2019  . At risk for adverse drug event 01/22/2019  . Bipolar 1 disorder ([place])   . 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 Osteoarthritis of left knee 12/30/2018  . Anxiety 12/30/2018  . Stage 3 chronic kidney disease 06/28/2016  . Hypercholesterolemia 08/20/2015  . Type 2 diabetes mellitus ([place]) 02/28/2014  . Esophageal reflux 02/28/2014  . Stress incontinence, female 02/28/2014  . Hypothyroidism (acquired) 02/28/2014  . Essential hypertension 11/17/2011    Past Surgical History:  Procedure Laterality Date  . CHOLECYSTECTOMY OPEN  1990's  . COLONOSCOPY  last one 07-17-2014  . CYSTOSCOPY WITH INJECTION N/A 10/29/2016   Procedure: CYSTOSCOPY WITH  INJECTION BOTOX 100 UNITS, urethral dilation;  Surgeon: Carolan Clines, MD;  Location: [facility];  Service: Urology;  Laterality: N/A;  . ESOPHAGOGASTRODUODENOSCOPY  last one 07-01-2016  . EXCISIONAL BREAST BX  1990   BENIGN  . HARDWARE REMOVAL Right 03/05/2020   Procedure: HARDWARE REMOVAL RIGHT ANKLE;  Surgeon: Leandrew Koyanagi, MD;  Location: [facility];  Service: Orthopedics;  Laterality: Right;  . ORIF TIBIA & FIBULA FRACTURES  2010   retained rod  . TONSILLECTOMY  age 79  . TOTAL KNEE ARTHROPLASTY Left 01/15/2019   Procedure: LEFT TOTAL KNEE ARTHROPLASTY;  Surgeon: Leandrew Koyanagi, MD;  Location: [facility];  Service: Orthopedics;  Laterality: Left;  Marland Kitchen VAGINAL HYSTERECTOMY  1979  . VENTRAL HERNIA REPAIR  12's     OB History    Gravida  2   Para  2   Term      Preterm      AB      Living  2     SAB      TAB      Ectopic      Multiple      Live Births              Family History  Problem Relation Age of Onset  . Breast cancer Mother 37  . Cancer Father        ? type  . Breast cancer Sister 31  . Breast cancer Maternal Grandmother        ? age    Social History   Tobacco Use  . Smoking status: Never Smoker  . Smokeless tobacco: Never Used  Vaping Use  . Vaping Use: Never used  Substance Use Topics  . Alcohol use: No  . Drug use: No    Home Medications Prior to Admission medications   Medication Sig Start Date End Date Taking? Authorizing Provider  amLODipine (NORVASC) 5 MG tablet Take 5 mg by mouth daily. 05/03/19   [provider]  Blood Glucose Monitoring Suppl (ONETOUCH VERIO REFLECT) w/Device KIT Check blood sugar 3 times a day 03/14/20   Maudie Mercury, MD  Calcium Carb-Cholecalciferol (CALCIUM 600/VITAMIN D3) 600-800 MG-UNIT TABS Take 1 tablet by mouth daily.     [provider]  Cholecalciferol (EQL VITAMIN D3) 50 MCG (2000 UT) CAPS Take 2,000 Units by mouth daily.     [provider]   ezetimibe (ZETIA) 10 MG tablet TAKE 1 TABLET BY MOUTH EVERY DAY IN THE EVENING 04/28/20   Marianna Payment, MD  gabapentin (NEURONTIN) 300 MG capsule Take 2 capsules (600 mg total) by mouth 2 (two) times daily. 06/06/20   Asencion Noble, MD  glucose blood (ONETOUCH VERIO) test strip Check blood sugar 3 times a day  03/14/20   Maudie Mercury, MD  hydrocortisone 2.5 % cream Apply 1 application topically 2 (two) times daily.     [provider]  insulin detemir (LEVEMIR) 100 unit/ml SOLN Inject 0.15 mLs (15 Units total) into the skin daily. 15 units nightly, aim to keep CBG between 140-180. 01/11/20 04/10/20  Marianna Payment, MD  Insulin Pen Needle (B-D ULTRAFINE III SHORT PEN) 31G X 8 MM MISC 1 each by Does not apply route daily. Diagnosis code: E11.9. Used to inject insulin daily. 02/20/20   Asencion Noble, MD  levothyroxine (SYNTHROID) 100 MCG tablet Take 1 tablet (100 mcg total) by mouth daily. 01/06/20 01/05/21  Asencion Noble, MD  Lidocaine (HM LIDOCAINE PATCH) 4 % PTCH Apply 1 patch topically daily as needed. Apply as needed once a day for up to 12 hours. 05/11/20   Davonna Belling, MD  methocarbamol (ROBAXIN) 500 MG tablet Take 1 tablet (500 mg total) by mouth every 8 (eight) hours as needed for muscle spasms. 05/11/20   Davonna Belling, MD  ondansetron (ZOFRAN) 4 MG tablet Take 1 tablet (4 mg total) by mouth 2 (two) times daily. 11/30/19   Maudie Mercury, MD  OneTouch Delica Lancets 97L MISC Check blood sugar 3 times a day 03/14/20   Maudie Mercury, MD  pantoprazole (PROTONIX) 40 MG tablet TAKE 1 TABLET BY MOUTH EVERY DAY 01/10/20   Marianna Payment, MD  polyethylene glycol (MIRALAX) 17 g packet Take 17 g by mouth daily. 06/05/20   Asencion Noble, MD  senna-docusate (SENOKOT-S) 8.6-50 MG tablet Take 1 tablet by mouth daily. 06/05/20   Asencion Noble, MD  sertraline (ZOLOFT) 100 MG tablet TAKE 1 TABLET BY MOUTH EVERY DAY Patient taking differently: Take 100 mg by mouth in the morning.   12/10/19   Axel Filler, MD  simvastatin (ZOCOR) 40 MG tablet Take 1 tablet (40 mg total) by mouth every evening. 10/25/19   Maudie Mercury, MD  vitamin B-12 (CYANOCOBALAMIN) 500 MCG tablet Take 1,000 mcg by mouth in the morning and at bedtime.     [provider]    Allergies    Bactrim [sulfamethoxazole-trimethoprim], Cephalexin, Morphine and related, Vioxx [rofecoxib], and Penicillins  Review of Systems   Review of Systems  Constitutional: Negative for fever.  HENT: Negative for sore throat.   Eyes: Negative for visual disturbance.  Respiratory: Negative for shortness of breath.   Cardiovascular: Negative for chest pain.  Gastrointestinal: Negative for abdominal pain.  Genitourinary: Negative for dysuria.  Musculoskeletal: Negative for back pain and neck pain.  Skin: Negative for rash.  Neurological: Positive for weakness and numbness.    Physical Exam Updated Vital Signs BP 120/64   Pulse 80   Temp 98.3 F (36.8 C) (Oral)   Resp 19   SpO2 97%   Physical Exam Vitals and nursing note reviewed.  Constitutional:      General: She is not in acute distress.    Appearance: She is well-developed.  HENT:     Head: Normocephalic and atraumatic.  Eyes:     Conjunctiva/sclera: Conjunctivae normal.  Cardiovascular:     Rate and Rhythm: Normal rate and regular rhythm.     Heart sounds: No murmur heard.   Pulmonary:     Effort: Pulmonary effort is normal. No respiratory distress.     Breath sounds: Normal breath sounds.  Abdominal:     Palpations: Abdomen is soft.     Tenderness: There is no abdominal tenderness.  Musculoskeletal:  General: Tenderness present.     Cervical back: Neck supple.     Comments: She has tenderness with any type of manipulation or palpation of her right extremity lower.  The temperature of the leg is normal.  Am unable to range of motion her due to pain.  Skin:    General: Skin is warm and dry.  Neurological:     Mental  Status: She is alert and oriented to person, place, and time.     Cranial Nerves: No cranial nerve deficit.     Comments: She has 5 out of 5 bilateral upper extremities and left lower extremity strength and sensation.  Right lower extremity 0 out of 5.     ED Results / Procedures / Treatments   Labs (all labs ordered are listed, but only abnormal results are displayed) Labs Reviewed  BASIC METABOLIC PANEL - Abnormal; Notable for the following components:      Result Value   Glucose, Bld 205 (*)    Creatinine, Ser 1.58 (*)    GFR, Estimated 34 (*)    All other components within normal limits  PROTIME-INR - Abnormal; Notable for the following components:   INR 1.3 (*)    All other components within normal limits  HEMOGLOBIN A1C - Abnormal; Notable for the following components:   Hgb A1c MFr Bld 7.9 (*)    All other components within normal limits  CBG MONITORING, ED - Abnormal; Notable for the following components:   Glucose-Capillary 169 (*)    All other components within normal limits  CBG MONITORING, ED - Abnormal; Notable for the following components:   Glucose-Capillary 339 (*)    All other components within normal limits  CBG MONITORING, ED - Abnormal; Notable for the following components:   Glucose-Capillary 163 (*)    All other components within normal limits  RESP PANEL BY RT-PCR (FLU A&B, COVID) ARPGX2  CBC WITH DIFFERENTIAL/PLATELET  CK  VITAMIN B12    EKG None  Radiology CT HEAD WO CONTRAST  Result Date: 06/11/2020 CLINICAL DATA:  Right leg pain and weakness EXAM: CT HEAD WITHOUT CONTRAST TECHNIQUE: Contiguous axial images were obtained from the base of the skull through the vertex without intravenous contrast. COMPARISON:  2015 FINDINGS: Brain: There is no acute intracranial hemorrhage, mass effect, or edema. Gray-white differentiation is preserved. There is no extra-axial fluid collection. Ventricles and sulci are within normal limits in size and configuration.  Vascular: There is atherosclerotic calcification at the skull base. Skull: Calvarium is unremarkable. Sinuses/Orbits: No acute finding. Other: None. IMPRESSION: No acute intracranial abnormality. Electronically Signed   By: Macy Mis M.D.   On: 06/11/2020 08:31   CT PELVIS WO CONTRAST  Result Date: 06/11/2020 CLINICAL DATA:  Right hip pain.  Frequent falls EXAM: CT PELVIS WITHOUT CONTRAST TECHNIQUE: Multidetector CT imaging of the pelvis was performed following the standard protocol without intravenous contrast. COMPARISON:  01/02/2020 FINDINGS: Urinary Tract: Circumferential thickening of the urinary bladder wall with mild associated fat stranding. Bowel:  Mild scattered colonic diverticulosis.  No acute findings. Vascular/Lymphatic: Scattered atherosclerotic calcification. No intrapelvic or inguinal lymphadenopathy. Reproductive:  Surgically absent uterus.  No adnexal masses. Other:  None. Musculoskeletal: Pelvic bony ring intact without fracture or diastasis. Mild degenerative changes of the bilateral hips. No fracture or dislocation of the hips. Lower lumbar facet arthropathy. Mild degenerative disc disease of L4-5. Mild induration within the subcutaneous fat overlying the right greater trochanter. No associated fluid collection or hematoma. IMPRESSION: 1. No acute osseous  abnormality of the pelvis. 2. Mild degenerative changes of the bilateral hips. 3. Circumferential thickening of the urinary bladder wall with mild associated fat stranding. Correlate with urinalysis to exclude cystitis. 4. Mild induration within the subcutaneous fat overlying the right greater trochanter without associated fluid collection or hematoma. Electronically Signed   By: Davina Poke D.O.   On: 06/11/2020 08:45   MR Lumbar Spine W Wo Contrast  Result Date: 06/11/2020 CLINICAL DATA:  Low back pain with progressive neurologic deficit. Right radiculopathy. EXAM: MRI LUMBAR SPINE WITHOUT AND WITH CONTRAST TECHNIQUE:  Multiplanar and multiecho pulse sequences of the lumbar spine were obtained without and with intravenous contrast. CONTRAST:  7.103mL GADAVIST GADOBUTROL 1 MMOL/ML IV SOLN COMPARISON:  10/20/2018 FINDINGS: Segmentation:  Standard lumbar numbering Alignment:  Mild dextrocurvature and L3-4, L5-S1 listhesis. Vertebrae:  No fracture, evidence of discitis, or bone lesion. Conus medullaris and cauda equina: Conus extends to the L1 level. Conus and cauda equina appear normal. Paraspinal and other soft tissues: Numerous renal cystic intensities. Asymmetry of the upper gluteal musculature is likely from asymmetric coverage. STIR hyperintensity along the left piriformis, minimally covered and new from prior. Disc levels: T12- L1: Unremarkable. L1-L2: Disc desiccation and narrowing with mild bulging. L2-L3: Disc desiccation and narrowing with small left foraminal protrusion. L3-L4: Disc narrowing and bulging. Mild facet spurring. The canal and foramina remain patent L4-L5: Disc narrowing and bulging. Degenerative facet spurring on the right more than left. Mild-to-moderate right foraminal narrowing. Mild thecal sac narrowing. A right paracentral inferiorly migrating extrusion on prior has regressed. L5-S1:Disc narrowing and bulging with central to right paracentral protrusion that is chronic. Facet osteoarthritis with spurring on the right more than left. Bilateral subarticular recess narrowing which could affect either S1 nerve root. The foramina are patent. IMPRESSION: 1. Multilevel degenerative disease without progression from 2020. Instead, there has been regression of a L4-5 right-sided disc extrusion since prior. 2. L5-S1 bilateral subarticular recess stenosis which could affect either S1 nerve root, chronic. 3. L4-5 mild to moderate right foraminal narrowing. 4. Edematous appearance of the partially covered left piriformis, of uncertain importance given contralateral symptoms. Electronically Signed   By: Monte Fantasia  M.D.   On: 06/11/2020 06:02    Procedures Procedures (including critical care time)  Medications Ordered in ED Medications  HYDROcodone-acetaminophen (NORCO/VICODIN) 5-325 MG per tablet 2 tablet (has no administration in time range)    ED Course  I have reviewed the triage vital signs and the nursing notes.  Pertinent labs & imaging results that were available during my care of the patient were reviewed by me and considered in my medical decision making (see chart for details).  Clinical Course as of Jun 12 1151  Tue Jun 10, 2020  2358 Discussed with one of the physicians from the internal medicine team who will evaluate the patient for admission.   [MB]    Clinical Course User Index [MB] Hayden Rasmussen, MD   MDM Rules/Calculators/A&P                         This patient complains of pain and loss of function of right leg; this involves an extensive number of treatment Options and is a complaint that carries with it a high risk of complications and Morbidity. The differential includes neuropathy, peripheral nerve injury, cord injury demyelination, myositis, DVT  I ordered, reviewed and interpreted labs, which included CBC with normal white count normal hemoglobin, chemistries normal other than mild  elevation in creatinine and an elevation in glucose, CK normal I ordered medication oral pain medicine Previous records obtained and reviewed in epic including prior medicine clinic notes I consulted internal medicine teaching service and discussed lab and imaging findings  Critical Interventions: None  After the interventions stated above, I reevaluated the patient and found patient's pain to be improved.  Still has no motor function.  Will need admission for further work-up.  Patient agreeable to plan   Final Clinical Impression(s) / ED Diagnoses Final diagnoses:  Weakness of right lower extremity  Pain of right lower extremity    Rx / DC Orders ED Discharge Orders     None       Hayden Rasmussen, MD 06/11/20 1158

## 2020-06-10 NOTE — Telephone Encounter (Signed)
  Chronic Care Management   Outreach Note  06/10/2020 Name: SHARALYN LOMBA MRN: [id number] DOB: [birth year]  Referred by: Maudie Mercury, MD Reason for referral : Advice Only   Patient did not report to ED as advised by [facility] Triage RN.  CCM team has not received referral.     Ronn Melena, [place] Worker [place] [phone]

## 2020-06-10 NOTE — ED Notes (Signed)
Chief Complaint  Patient presents with  . Foot Pain    R foot- chronic problem    Chronic problem for the last 2 months. No clear explanation as to why it has gotten worse per pt. Patient states she is non weight bearing, pulses noted. Cap refill less than 3.   Patient endorses pain 10/10.  BP 120/64   Pulse 80   Temp 98.3 F (36.8 C) (Oral)   Resp 19   SpO2 97%

## 2020-06-10 NOTE — ED Triage Notes (Signed)
Pt BIB [facility] c/o Right leg pain x2 months, getting progressively worse, seen here and [facility] the same. Dx w/a blood clot, started on Eliquis and wearing a boot. Pt requesting placement at [facility]   BP 126/72 HR 80 95% RA  CBG 250

## 2020-06-11 ENCOUNTER — Observation Stay (HOSPITAL_COMMUNITY): Payer: Medicare Other

## 2020-06-11 ENCOUNTER — Observation Stay (HOSPITAL_BASED_OUTPATIENT_CLINIC_OR_DEPARTMENT_OTHER): Payer: Medicare Other

## 2020-06-11 DIAGNOSIS — R29818 Other symptoms and signs involving the nervous system: Secondary | ICD-10-CM

## 2020-06-11 DIAGNOSIS — F444 Conversion disorder with motor symptom or deficit: Principal | ICD-10-CM

## 2020-06-11 DIAGNOSIS — R29898 Other symptoms and signs involving the musculoskeletal system: Secondary | ICD-10-CM | POA: Insufficient documentation

## 2020-06-11 DIAGNOSIS — Z86718 Personal history of other venous thrombosis and embolism: Secondary | ICD-10-CM | POA: Diagnosis not present

## 2020-06-11 DIAGNOSIS — M79604 Pain in right leg: Secondary | ICD-10-CM

## 2020-06-11 DIAGNOSIS — M7989 Other specified soft tissue disorders: Secondary | ICD-10-CM

## 2020-06-11 DIAGNOSIS — M25571 Pain in right ankle and joints of right foot: Secondary | ICD-10-CM | POA: Insufficient documentation

## 2020-06-11 LAB — CBG MONITORING, ED
Glucose-Capillary: 169 mg/dL — ABNORMAL HIGH (ref 70–99)
Glucose-Capillary: 339 mg/dL — ABNORMAL HIGH (ref 70–99)
Glucose-Capillary: 163 mg/dL — ABNORMAL HIGH (ref 70–99)
Glucose-Capillary: 160 mg/dL — ABNORMAL HIGH (ref 70–99)

## 2020-06-11 LAB — HEMOGLOBIN A1C
Hgb A1c MFr Bld: 7.9 % — ABNORMAL HIGH (ref 4.8–5.6)
Mean Plasma Glucose: 180.03 mg/dL

## 2020-06-11 LAB — VITAMIN B12: Vitamin B-12: 540 pg/mL (ref 180–914)

## 2020-06-11 LAB — GLUCOSE, CAPILLARY: Glucose-Capillary: 185 mg/dL — ABNORMAL HIGH (ref 70–99)

## 2020-06-11 MED ORDER — GADOBUTROL 1 MMOL/ML IV SOLN
7.5000 mL | Freq: Once | INTRAVENOUS | Status: AC | PRN
  Administered 2020-06-11: 7.5 mL via INTRAVENOUS

## 2020-06-11 MED ORDER — HYDROMORPHONE HCL 1 MG/ML IJ SOLN
0.5000 mg | Freq: Once | INTRAMUSCULAR | Status: AC | PRN
  Administered 2020-06-11: 0.5 mg via INTRAVENOUS
  Filled 2020-06-11: qty 1

## 2020-06-11 MED ORDER — INSULIN ASPART 100 UNIT/ML ~~LOC~~ SOLN
0.0000 [IU] | Freq: Three times a day (TID) | SUBCUTANEOUS | Status: DC
  Administered 2020-06-11: 2 [IU] via SUBCUTANEOUS
  Administered 2020-06-11: 7 [IU] via SUBCUTANEOUS
  Administered 2020-06-11: 2 [IU] via SUBCUTANEOUS
  Administered 2020-06-12: 3 [IU] via SUBCUTANEOUS
  Administered 2020-06-12: 7 [IU] via SUBCUTANEOUS
  Administered 2020-06-13: 5 [IU] via SUBCUTANEOUS
  Administered 2020-06-13: 3 [IU] via SUBCUTANEOUS
  Administered 2020-06-13: 2 [IU] via SUBCUTANEOUS

## 2020-06-11 MED ORDER — HYDROCODONE-ACETAMINOPHEN 5-325 MG PO TABS
1.0000 | ORAL_TABLET | Freq: Four times a day (QID) | ORAL | Status: DC | PRN
  Administered 2020-06-11 – 2020-06-14 (×8): 1 via ORAL
  Filled 2020-06-11 (×8): qty 1

## 2020-06-11 MED ORDER — VITAMIN D 25 MCG (1000 UNIT) PO TABS
2000.0000 [IU] | ORAL_TABLET | Freq: Every day | ORAL | Status: DC
  Administered 2020-06-11 – 2020-06-16 (×6): 2000 [IU] via ORAL
  Filled 2020-06-11 (×6): qty 2

## 2020-06-11 MED ORDER — INSULIN ASPART 100 UNIT/ML ~~LOC~~ SOLN
0.0000 [IU] | Freq: Every day | SUBCUTANEOUS | Status: DC
  Administered 2020-06-16: 2 [IU] via SUBCUTANEOUS

## 2020-06-11 MED ORDER — POLYETHYLENE GLYCOL 3350 17 G PO PACK: 17.0000 g | PACK | Freq: Every day | ORAL | Status: DC | PRN

## 2020-06-11 MED ORDER — ACETAMINOPHEN 325 MG PO TABS
650.0000 mg | ORAL_TABLET | Freq: Four times a day (QID) | ORAL | Status: DC | PRN
  Administered 2020-06-12 (×3): 650 mg via ORAL
  Filled 2020-06-11 (×3): qty 2

## 2020-06-11 MED ORDER — AMLODIPINE BESYLATE 5 MG PO TABS
5.0000 mg | ORAL_TABLET | Freq: Every day | ORAL | Status: DC
  Administered 2020-06-11 – 2020-06-16 (×6): 5 mg via ORAL
  Filled 2020-06-11 (×6): qty 1

## 2020-06-11 MED ORDER — INSULIN DETEMIR 100 UNIT/ML ~~LOC~~ SOLN
10.0000 [IU] | Freq: Every day | SUBCUTANEOUS | Status: DC
  Administered 2020-06-11 – 2020-06-16 (×6): 10 [IU] via SUBCUTANEOUS
  Filled 2020-06-11 (×6): qty 0.1

## 2020-06-11 MED ORDER — SERTRALINE HCL 100 MG PO TABS
100.0000 mg | ORAL_TABLET | Freq: Every day | ORAL | Status: DC
  Administered 2020-06-11 – 2020-06-12 (×2): 100 mg via ORAL
  Filled 2020-06-11 (×3): qty 1

## 2020-06-11 MED ORDER — CALCIUM CARBONATE-VITAMIN D 500-200 MG-UNIT PO TABS
1.0000 | ORAL_TABLET | Freq: Every day | ORAL | Status: DC
  Administered 2020-06-11 – 2020-06-16 (×6): 1 via ORAL
  Filled 2020-06-11 (×7): qty 1

## 2020-06-11 MED ORDER — GADOBUTROL 1 MMOL/ML IV SOLN
7.0000 mL | Freq: Once | INTRAVENOUS | Status: AC | PRN
  Administered 2020-06-11: 7 mL via INTRAVENOUS

## 2020-06-11 MED ORDER — GABAPENTIN 300 MG PO CAPS
600.0000 mg | ORAL_CAPSULE | Freq: Two times a day (BID) | ORAL | Status: DC
  Administered 2020-06-11: 600 mg via ORAL
  Filled 2020-06-11 (×4): qty 2

## 2020-06-11 MED ORDER — SENNOSIDES-DOCUSATE SODIUM 8.6-50 MG PO TABS: 1.0000 | ORAL_TABLET | Freq: Every evening | ORAL | Status: DC | PRN

## 2020-06-11 MED ORDER — LEVOTHYROXINE SODIUM 100 MCG PO TABS
100.0000 ug | ORAL_TABLET | Freq: Every day | ORAL | Status: DC
  Administered 2020-06-12: 100 ug via ORAL
  Filled 2020-06-11: qty 1

## 2020-06-11 MED ORDER — VITAMIN B-12 1000 MCG PO TABS
1000.0000 ug | ORAL_TABLET | Freq: Every day | ORAL | Status: DC
  Administered 2020-06-11 – 2020-06-16 (×6): 1000 ug via ORAL
  Filled 2020-06-11 (×6): qty 1

## 2020-06-11 MED ORDER — PANTOPRAZOLE SODIUM 40 MG PO TBEC
40.0000 mg | DELAYED_RELEASE_TABLET | Freq: Every day | ORAL | Status: DC
  Administered 2020-06-11 – 2020-06-16 (×6): 40 mg via ORAL
  Filled 2020-06-11 (×6): qty 1

## 2020-06-11 MED ORDER — APIXABAN 5 MG PO TABS
5.0000 mg | ORAL_TABLET | Freq: Two times a day (BID) | ORAL | Status: DC
  Administered 2020-06-12 – 2020-06-16 (×10): 5 mg via ORAL
  Filled 2020-06-11 (×10): qty 1

## 2020-06-11 MED ORDER — EZETIMIBE 10 MG PO TABS
10.0000 mg | ORAL_TABLET | Freq: Every evening | ORAL | Status: DC
  Administered 2020-06-12 – 2020-06-16 (×5): 10 mg via ORAL
  Filled 2020-06-11 (×5): qty 1

## 2020-06-11 MED ORDER — SIMVASTATIN 20 MG PO TABS
40.0000 mg | ORAL_TABLET | Freq: Every evening | ORAL | Status: DC
  Administered 2020-06-11 – 2020-06-12 (×2): 40 mg via ORAL
  Filled 2020-06-11 (×2): qty 2

## 2020-06-11 MED ORDER — HYDROCODONE-ACETAMINOPHEN 5-325 MG PO TABS
1.0000 | ORAL_TABLET | Freq: Four times a day (QID) | ORAL | Status: DC | PRN
  Administered 2020-06-11: 2 via ORAL
  Administered 2020-06-11: 1 via ORAL
  Filled 2020-06-11: qty 1
  Filled 2020-06-11: qty 2

## 2020-06-11 MED ORDER — ACETAMINOPHEN 650 MG RE SUPP: 650.0000 mg | Freq: Four times a day (QID) | RECTAL | Status: DC | PRN

## 2020-06-11 NOTE — ED Notes (Signed)
Pt transported to MRI 

## 2020-06-11 NOTE — Progress Notes (Signed)
Right lower extremity venous study completed.     Please see CV Proc for preliminary results.   Morghan Kester, RVT  

## 2020-06-11 NOTE — ED Notes (Signed)
Breakfast ordered 

## 2020-06-11 NOTE — Hospital Course (Addendum)
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 Etiology is unclear, extensive work up with imaging unrevealing thus far. Her symptoms are very atypical and are not consistent with a focal neurological issue. The team did discuss with neurology over the phone as well who did not feel this was a neurological problem. We have ruled out hip fracture with prior xrays and CT this admission. Doubt avascular necrosis however we would need MRI to rule this out completely. However given the severity of her symptoms would have expected to see findings of this on CT if present. There was no evidence of stroke on CT head. We also ordered MRI brain to ensure this was not a demyelinating MS lesion, and that was unremarkable as well. The only thing was have not imaged is her thoracic spine. But given the location of her symptoms it would be highly unlikely to be a thoracic issue. 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 Psych was consulted and they agree with Functional Neurological Symptom Disorder. PT/OT suggested SNF. She is provided with education, plans for PT at short term facility and outpatient counseling.   2. Chronic Right Leg pain: Likely  multifactorial from fibromyalgia and DM neuropathy. Started and continued on Lyrica 25 mg BID and 50 mg QHS. Also, on Flexeril BID prn   2. T2DM   On levemir 15 units qhs and SSI at home. Current HbA1c is 7.9. Continue home medications. Started and Lyrica 25 mg BID and 50 mg at night. Also continue Flexiril 5 mg BID.    3. Hx of Vitamin B12 deficiency Current Vitamin B12 level 540 . Continue on B12 1078mcg daily   4.HTN HLD Continued on home medication norvasc 5mg  daily. Simvastatin 40mg  daily changed to Lipitor 20 mg/day   5. Hypothyroidism Current TSH is 46.431, T3 1.4, T4 0.85. Was n synthroid 190mcg daily at home changed to 125 mcg.   6.  Depression On zoloft 100 mg daily at home, changed to Zoloft 150 mg.

## 2020-06-11 NOTE — ED Notes (Signed)
Returned from MRI 

## 2020-06-11 NOTE — ED Notes (Signed)
Patient transported to MRI 

## 2020-06-11 NOTE — ED Notes (Signed)
Attempted to call report, RN unavailable, will call back. 

## 2020-06-11 NOTE — H&P (Signed)
Date: 06/11/2020               Patient Name:  Toni Hill MRN: [id number]  DOB: September 25, 1945 Age / Sex: 74 y.o., female   PCP: Toni Mercury, MD         Medical Service: Internal Medicine Teaching Service         Attending Physician: Dr. Velna Ochs, MD    First Contact: Dr. [name], MD Pager: [phone]  Second Contact: Dr. Jose Persia, MD Pager: [phone]       After Hours (After 5p/  First Contact Pager: [phone]  weekends / holidays): Second Contact Pager: [phone]   Chief Complaint: chronic right leg pain  History of Present Illness:  [name] is a 74 year old female with history of HTN, HLD, T2DM, CKD stage III, diabetic polyneuropathy, and right tib-fib fracture s/p ORIF (2010) presenting with chronic right leg pain and paralysis for the past 3 months. As per the patient, the pain is constant, 10/10 in severity, and is located in her entire right leg all the way up to her waist. Reports that it feels like someone is stabbing her right leg with knives and she also reports burning around her right ankle at night. Nothing makes the pain better, including gabapentin and tylenol. The pain does not radiate anywhere else. She has not had any pain similar to this in the past. Patient had orthopedic hardware placed in 2010 after a distal tib-fib fracture, but it was removed by Dr. Erlinda Hill on 03/05/20 because her screws were coming out. Patient reports that the pain started after removal of the hardware. Furthermore, she has had progressive right leg paralysis beginning at that time as well to the point where she has been bedridden for the past couple of months. Her grandson, Toni Hill, helps move her to a wheelchair for medical appointments.  At a follow-up appointment with Dr. Erlinda Hill, patient was noted to have contractures of her right toe and Dr. Erlinda Hill recommended neurology referral, but no neurology notes or referrals on chart review. On 05/22/20, patient was seen at [facility] and found to have a chronic-appearing blood clot in her right leg and was subsequently started on Eliquis. She has been seen in the [facility] a couple of times since for similar complaints of right leg pain and had her medications slightly adjusted (increased Gabapentin as this was initially helping with her pain), however patient is now reporting that gabapentin has stopped helping with her pain and that her nephrologist told her to limit gabapentin due to her CKD III.   Patient also reporting chronic low back pain that is midline at her lumbar spine with no radiculopathy. She was seen in the clinic for this as well and an MRI lumbar spine was ordered to rule out a spinal cord compression causing RLE pain and weakness/paralysis. However, patient never made it to the appointment because of trouble finding transportation.  ED Course: CBC w/diff and PT/INR unremarkable. BMP showing glucose 205, Cr 1.58, GFR 34. COVID-19 and influenza negative. CK wnl.    Meds:   No current facility-administered medications on file prior to encounter.   Current Outpatient Medications on File Prior to Encounter  Medication Sig Dispense Refill  . amLODipine (NORVASC) 5 MG tablet Take 5 mg by mouth daily.    . Blood Glucose Monitoring Suppl (ONETOUCH VERIO REFLECT) w/Device KIT Check blood sugar 3 times a day 1 kit 0  . Calcium Carb-Cholecalciferol (CALCIUM 600/VITAMIN D3) 600-800  MG-UNIT TABS Take 1 tablet by mouth daily.     . Cholecalciferol (EQL VITAMIN D3) 50 MCG (2000 UT) CAPS Take 2,000 Units by mouth daily.     Marland Kitchen ezetimibe (ZETIA) 10 MG tablet TAKE 1 TABLET BY MOUTH EVERY DAY IN THE EVENING 90 tablet 1  . gabapentin (NEURONTIN) 300 MG capsule Take 2 capsules (600 mg total) by mouth 2 (two) times daily. 60 capsule 5  . glucose blood (ONETOUCH VERIO) test strip Check blood sugar 3 times a day 300 each 3  . hydrocortisone 2.5 % cream Apply 1 application topically 2 (two) times daily.     . insulin detemir  (LEVEMIR) 100 unit/ml SOLN Inject 0.15 mLs (15 Units total) into the skin daily. 15 units nightly, aim to keep CBG between 140-180. 4.5 mL 2  . Insulin Pen Needle (B-D ULTRAFINE III SHORT PEN) 31G X 8 MM MISC 1 each by Does not apply route daily. Diagnosis code: E11.9. Used to inject insulin daily. 100 each 2  . levothyroxine (SYNTHROID) 100 MCG tablet Take 1 tablet (100 mcg total) by mouth daily. 30 tablet 2  . Lidocaine (HM LIDOCAINE PATCH) 4 % PTCH Apply 1 patch topically daily as needed. Apply as needed once a day for up to 12 hours. 15 patch 0  . methocarbamol (ROBAXIN) 500 MG tablet Take 1 tablet (500 mg total) by mouth every 8 (eight) hours as needed for muscle spasms. 8 tablet 0  . ondansetron (ZOFRAN) 4 MG tablet Take 1 tablet (4 mg total) by mouth 2 (two) times daily. 180 tablet 3  . OneTouch Delica Lancets 08M MISC Check blood sugar 3 times a day 300 each 3  . pantoprazole (PROTONIX) 40 MG tablet TAKE 1 TABLET BY MOUTH EVERY DAY 90 tablet 1  . polyethylene glycol (MIRALAX) 17 g packet Take 17 g by mouth daily. 14 each 0  . senna-docusate (SENOKOT-S) 8.6-50 MG tablet Take 1 tablet by mouth daily. 30 tablet 0  . sertraline (ZOLOFT) 100 MG tablet TAKE 1 TABLET BY MOUTH EVERY DAY (Patient taking differently: Take 100 mg by mouth in the morning. ) 90 tablet 1  . simvastatin (ZOCOR) 40 MG tablet Take 1 tablet (40 mg total) by mouth every evening. 90 tablet 3  . vitamin B-12 (CYANOCOBALAMIN) 500 MCG tablet Take 1,000 mcg by mouth in the morning and at bedtime.       No outpatient medications have been marked as taking for the 06/10/20 encounter [facility] Encounter).    Allergies: Allergies as of 06/10/2020 - Review Complete 06/10/2020  Allergen Reaction Noted  . Bactrim [sulfamethoxazole-trimethoprim]  01/25/2019  . Cephalexin Itching 11/06/2019  . Morphine and related Nausea And Vomiting 12/01/2018  . Vioxx [rofecoxib]  11/20/2016  . Penicillins Hives and Itching 10/27/2016   Past  Medical History:  Diagnosis Date  . Bilateral lower extremity edema    right > left  . Bipolar 1 disorder ([place])   . CKD (chronic kidney disease) stage 3, GFR 30-59 ml/min (HCC)     Dr Lawson Radar, [facility]  . Diabetes, polyneuropathy ([place])    FEET AND FINGER TIPS  . Fibromyalgia   . GERD (gastroesophageal reflux disease)   . History of chronic gastritis   . History of COVID-24 Aug 2019  . Hyperlipidemia   . Hypertension    per pt was take off bp medication because of kidney disease told by her nephrologist  . Hypothyroidism   . Mixed incontinence urge and stress   .  OA (osteoarthritis)    KNEES  . OAB (overactive bladder)   . PONV (postoperative nausea and vomiting)   . 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 Type 2 diabetes mellitus treated with insulin ([place])   . Wears glasses     Family History:  Family History  Problem Relation Age of Onset  . Breast cancer Mother 5  . Cancer Father        ? type  . Breast cancer Sister 92  . Breast cancer Maternal Grandmother        ? age    Social History:  Social History   Tobacco Use  . Smoking status: Never Smoker  . Smokeless tobacco: Never Used  Vaping Use  . Vaping Use: Never used  Substance Use Topics  . Alcohol use: No  . Drug use: No    Review of Systems: A complete ROS was negative except as per HPI.   Physical Exam: Blood pressure 120/64, pulse 80, temperature 98.3 F (36.8 C), temperature source Oral, resp. rate 19, SpO2 97 %. Physical Exam Constitutional:      Appearance: Normal appearance.     Comments: Elderly female lying in bed, tearful during encounter, NAD.  HENT:     Head: Normocephalic and atraumatic.     Mouth/Throat:     Mouth: Mucous membranes are moist.     Pharynx: Oropharynx is clear.  Eyes:     General: No scleral icterus.    Extraocular Movements: Extraocular movements intact.     Conjunctiva/sclera: Conjunctivae normal.     Pupils: Pupils are equal, round, and  reactive to light.  Cardiovascular:     Rate and Rhythm: Normal rate and regular rhythm.     Pulses: Normal pulses.     Heart sounds: Normal heart sounds. No murmur heard.  No friction rub. No gallop.   Pulmonary:     Effort: Pulmonary effort is normal.     Breath sounds: Normal breath sounds. No wheezing, rhonchi or rales.  Abdominal:     General: Bowel sounds are normal. There is no distension.     Palpations: Abdomen is soft.     Tenderness: There is no abdominal tenderness. There is no guarding or rebound.  Musculoskeletal:        General: Normal range of motion.     Cervical back: Normal range of motion.     Comments: Diffuse pain in RLE with any passive movement.  Skin:    General: Skin is warm and dry.     Comments: Bruising noted in RLE.  Neurological:     Mental Status: She is alert and oriented to person, place, and time.     Comments: LLE knee and ankle DTRs 2+. Unable to assess RLE DTRs as patient could not tolerate movement of RLE. LLE strength intact. RLE 0/5 strength. Sensation absent up to ankle in RLE. Sensation absent up to mid-foot in LLE. Right foot digits contracted (see image).  Psychiatric:     Comments: Tearful during encounter          EKG: NONE  No imaging results thus far.    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  Chronic RLE pain and paralysis/weakness Small chronic-appearing DVT  in RLE Diabetic polyneuropathy Presenting with chronic right leg pain and paralysis/weakness for the past 3 months. Found to have a small chronic-appearing DVT in right leg at the bifurcation of the femoral vein and profundus, on Eliquis. She is unable to move her right leg at all. She was  seen in the [facility] for this and scheduled for MRI lumbar spine, but patient unable to find transportation to obtain imaging. Neurological exam is very difficult as patient has significant pain with any movement of right leg. Unable to flex her hip or knee and unable to plantarflex or dorsiflex her right foot. She does have chronic diabetic polyneuropathy with diminished sensation in bilateral feet. CK wnl so unlikely for this to be a myositis/myopathy. Chronic pain syndrome or post-thrombotic syndrome could explain her current pain, but would not account for her unilateral leg weakness/paralysis. -f/u STAT MRI lumbar spine to r/o spinal cord compression -f/u venous dopplers -consider EMG/nerve conduction studies as outpatient -0.5mg  IV dilaudid once for pain (norco did not help) -gabapentin 600mg  BID for neuropathic pain  CKD Stage III Baseline Cr ~1.3-1.4. On admission, Cr 1.58.  -monitor UOP and I/O's -trend BMP -avoid nephrotoxic medications  T2DM w/polyneuropathy On levemir 15 units qhs and SSI at home. -started levemir 10 units qhs -started SSI -f/u HbA1c -continued gabapentin 600mg  BID for neuropathic pain  Hx of Vitamin B12 deficiency Vitamin B12 level 86 on 03/08/2019. Was started on vitamin B12 supplementation, but no follow up of vitamin B12 level thereafter to assess treatment success. -f/u B12 level -started on B12 1063mcg daily  HTN HLD On norvasc 5mg  daily, simvastatin 40mg  daily, and ezetimibe 10mg  daily. -continue home medications  Hypothyroidism On synthroid 123mcg daily at home. -continue home medication  Depression On zoloft 100mg  daily at home. -continue home medication   Dispo: Admit patient to Observation with expected length of stay less than 2 midnights.  Signed: Virl Axe, MD 06/11/2020, 1:20 AM  After 5pm on weekdays and 1pm on weekends: On Call pager: [phone]

## 2020-06-11 NOTE — ED Notes (Signed)
Pt transported to CT ?

## 2020-06-11 NOTE — Progress Notes (Signed)
PT Cancellation Note  Patient Details Name: Toni Hill MRN: [id number] DOB: 1946/05/08   Cancelled Treatment:    Reason Eval/Treat Not Completed: Active bedrest order Pt with strict bedrest orders. Will follow up as schedule allows.   Reuel Derby, PT, DPT  Acute Rehabilitation Services  Pager: [phone] Office: [phone]    Rudean Hitt 06/11/2020, 9:22 AM

## 2020-06-11 NOTE — Progress Notes (Addendum)
Subjective: HD#1 No acute overnight events.  [name] states her symptoms began 2 months ago without any instigating factors. She did have a fall a while ago but doesn't remember if it was around the same time. With that fall, she fell on her bottom. The symptoms gradually progressed to her not being able to move her toes. Her toes are also very painful to touch. At night, she feels her right foot and ankle are burning. She is having pain up to the bottom of her ribs. She has had bilateral knee replacements.   Objective:  Vital signs in last 24 hours: Vitals:   06/11/20 0645 06/11/20 0700 06/11/20 0730 06/11/20 0900  BP:  (!) 113/52 119/60 108/64  Pulse: 69 66 68 78  Resp: (!) 26 (!) 28 (!) 21 16  Temp:      TempSrc:      SpO2: 96% 94% 95% 98%   CBC Latest Ref Rng & Units 06/10/2020 05/11/2020 01/05/2020  WBC 4.0 - 10.5 K/uL 6.5 7.8 5.5  Hemoglobin 12.0 - 15.0 g/dL 13.0 13.7 12.3  Hematocrit 36 - 46 % 39.9 43.8 37.8  Platelets 150 - 400 K/uL 351 238 239   BMP Latest Ref Rng & Units 06/10/2020 06/05/2020 05/11/2020  Glucose 70 - 99 mg/dL 205(H) 267(H) 169(H)  BUN 8 - 23 mg/dL 23 32(H) 23  Creatinine 0.44 - 1.00 mg/dL 1.58(H) 1.24(H) 1.32(H)  BUN/Creat Ratio 12 - 28 - 26 -  Sodium 135 - 145 mmol/L 140 140 139  Potassium 3.5 - 5.1 mmol/L 4.4 4.5 4.5  Chloride 98 - 111 mmol/L 99 99 103  CO2 22 - 32 mmol/L 28 25 27   Calcium 8.9 - 10.3 mg/dL 9.9 9.7 9.6   MRI Lumbar spine  IMPRESSION: 1. Multilevel degenerative disease without progression from 2020. Instead, there has been regression of a L4-5 right-sided disc extrusion since prior. 2. L5-S1 bilateral subarticular recess stenosis which could affect either S1 nerve root, chronic. 3. L4-5 mild to moderate right foraminal narrowing. 4. Edematous appearance of the partially covered left piriformis, of uncertain importance given contralateral symptoms.   Physical exam:  General:Well developed, well nourished, elderly female  lying uncomfortably in bed, NAD HEENT: [place]/AT, MMM CV: RRR, S1, S2 normal, No m/r/g Pulmonary: CTAB Abdomen: Soft, no guarding, no rigidity, no distention, BS+ MSK: Left leg externally rotated, Hip displaced near-mid to abdomen, toes contracture; R leg, hip, toes N Neuro; AAO*3, sensation decreased on Rt foot. Psych: Labile mood and affect.   Assessment/Plan: Ms. 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  Active Problems:   paralysis of right leg  Chronic RLE pain and paralysis/weakness Hx RLE DVT Diabetic polyneuropathy Presents with R leg pain and paralysis/weakness*2 months. Mentions a fall 2 months ago, hitting her back.  Found to have a small chronic-appearing DVT in right leg at the bifurcation of the femoral vein and profundus, on Eliquis. Unable to move her leg at all. Neuro exam difficult, due to ongoing pain. CK wnl so unlikely for this to be a myositis/myopathy. CT head & CT pelvis shows no abnormality. MRI lumbar spine does not show any acute abnormality, But L5-S1 bilateral subarticular recess stenosis which could affect either S1 nerve root, chronic.   -F/U MR Brain  -f/u venous dopplers -Norco q6h PRN -Gabapentin 600mg  BID  for neuropathic pain   CKD Stage III Baseline Cr ~1.3-1.4. On admission, Cr 1.58.  -Monitor UOP and I/O's -F/U BMP -avoid nephrotoxic medications   T2DM w/polyneuropathy On levemir 15 units qhs and SSI at home. -C/w levemir 10 units qhs - SSI -f/u HbA1c -continued gabapentin 600mg  BID for neuropathic pain   Hx of Vitamin B12 deficiency Vitamin B12 level 86 on 03/08/2019. Was started on vitamin B12 supplementation, but no follow up of vitamin B12 level thereafter to assess treatment success. Started B12 1018mcg daily.  -F/U B12  level -C/W B12 1080mcg daily    Prior to Admission Living Arrangement: Home Anticipated Discharge Location: Home Barriers to Discharge: ongoing medical management Dispo: Anticipated discharge in approximately 3-4 day(s).   [name], MD 06/11/2020, 10:36 AM Pager: [phone] After 5pm on weekdays and 1pm on weekends: On Call pager [phone]

## 2020-06-12 DIAGNOSIS — F444 Conversion disorder with motor symptom or deficit: Secondary | ICD-10-CM | POA: Diagnosis present

## 2020-06-12 DIAGNOSIS — F419 Anxiety disorder, unspecified: Secondary | ICD-10-CM | POA: Diagnosis present

## 2020-06-12 DIAGNOSIS — Z86718 Personal history of other venous thrombosis and embolism: Secondary | ICD-10-CM | POA: Diagnosis not present

## 2020-06-12 DIAGNOSIS — M797 Fibromyalgia: Secondary | ICD-10-CM | POA: Diagnosis present

## 2020-06-12 DIAGNOSIS — E1122 Type 2 diabetes mellitus with diabetic chronic kidney disease: Secondary | ICD-10-CM | POA: Diagnosis present

## 2020-06-12 DIAGNOSIS — Z20822 Contact with and (suspected) exposure to covid-19: Secondary | ICD-10-CM | POA: Diagnosis present

## 2020-06-12 DIAGNOSIS — I129 Hypertensive chronic kidney disease with stage 1 through stage 4 chronic kidney disease, or unspecified chronic kidney disease: Secondary | ICD-10-CM | POA: Diagnosis present

## 2020-06-12 DIAGNOSIS — Z885 Allergy status to narcotic agent status: Secondary | ICD-10-CM | POA: Diagnosis not present

## 2020-06-12 DIAGNOSIS — M79604 Pain in right leg: Secondary | ICD-10-CM | POA: Diagnosis not present

## 2020-06-12 DIAGNOSIS — E039 Hypothyroidism, unspecified: Secondary | ICD-10-CM | POA: Diagnosis present

## 2020-06-12 DIAGNOSIS — M25571 Pain in right ankle and joints of right foot: Secondary | ICD-10-CM | POA: Diagnosis present

## 2020-06-12 DIAGNOSIS — R29898 Other symptoms and signs involving the musculoskeletal system: Secondary | ICD-10-CM | POA: Diagnosis not present

## 2020-06-12 DIAGNOSIS — Z8616 Personal history of COVID-19: Secondary | ICD-10-CM | POA: Diagnosis not present

## 2020-06-12 DIAGNOSIS — N1832 Chronic kidney disease, stage 3b: Secondary | ICD-10-CM | POA: Diagnosis present

## 2020-06-12 DIAGNOSIS — E538 Deficiency of other specified B group vitamins: Secondary | ICD-10-CM | POA: Diagnosis present

## 2020-06-12 DIAGNOSIS — Z79899 Other long term (current) drug therapy: Secondary | ICD-10-CM | POA: Diagnosis not present

## 2020-06-12 DIAGNOSIS — Z881 Allergy status to other antibiotic agents status: Secondary | ICD-10-CM | POA: Diagnosis not present

## 2020-06-12 DIAGNOSIS — Z882 Allergy status to sulfonamides status: Secondary | ICD-10-CM | POA: Diagnosis not present

## 2020-06-12 DIAGNOSIS — Z794 Long term (current) use of insulin: Secondary | ICD-10-CM | POA: Diagnosis not present

## 2020-06-12 DIAGNOSIS — Z880 Allergy status to penicillin: Secondary | ICD-10-CM | POA: Diagnosis not present

## 2020-06-12 DIAGNOSIS — F319 Bipolar disorder, unspecified: Secondary | ICD-10-CM | POA: Diagnosis present

## 2020-06-12 DIAGNOSIS — K5900 Constipation, unspecified: Secondary | ICD-10-CM | POA: Diagnosis present

## 2020-06-12 DIAGNOSIS — M79661 Pain in right lower leg: Secondary | ICD-10-CM | POA: Diagnosis not present

## 2020-06-12 DIAGNOSIS — E1142 Type 2 diabetes mellitus with diabetic polyneuropathy: Secondary | ICD-10-CM | POA: Diagnosis present

## 2020-06-12 DIAGNOSIS — K219 Gastro-esophageal reflux disease without esophagitis: Secondary | ICD-10-CM | POA: Diagnosis present

## 2020-06-12 DIAGNOSIS — E785 Hyperlipidemia, unspecified: Secondary | ICD-10-CM | POA: Diagnosis present

## 2020-06-12 DIAGNOSIS — Z79890 Hormone replacement therapy (postmenopausal): Secondary | ICD-10-CM | POA: Diagnosis not present

## 2020-06-12 DIAGNOSIS — Z7401 Bed confinement status: Secondary | ICD-10-CM | POA: Diagnosis not present

## 2020-06-12 LAB — GLUCOSE, CAPILLARY
Glucose-Capillary: 127 mg/dL — ABNORMAL HIGH (ref 70–99)
Glucose-Capillary: 214 mg/dL — ABNORMAL HIGH (ref 70–99)
Glucose-Capillary: 329 mg/dL — ABNORMAL HIGH (ref 70–99)
Glucose-Capillary: 196 mg/dL — ABNORMAL HIGH (ref 70–99)

## 2020-06-12 LAB — CBC
HCT: 34 % — ABNORMAL LOW (ref 36.0–46.0)
Hemoglobin: 11.2 g/dL — ABNORMAL LOW (ref 12.0–15.0)
MCH: 32.3 pg (ref 26.0–34.0)
MCHC: 32.9 g/dL (ref 30.0–36.0)
MCV: 98 fL (ref 80.0–100.0)
Platelets: 291 10*3/uL (ref 150–400)
RBC: 3.47 MIL/uL — ABNORMAL LOW (ref 3.87–5.11)
RDW: 13.2 % (ref 11.5–15.5)
WBC: 5.4 10*3/uL (ref 4.0–10.5)
nRBC: 0 % (ref 0.0–0.2)

## 2020-06-12 LAB — BASIC METABOLIC PANEL
Anion gap: 10 (ref 5–15)
BUN: 26 mg/dL — ABNORMAL HIGH (ref 8–23)
CO2: 28 mmol/L (ref 22–32)
Calcium: 9.2 mg/dL (ref 8.9–10.3)
Chloride: 101 mmol/L (ref 98–111)
Creatinine, Ser: 1.59 mg/dL — ABNORMAL HIGH (ref 0.44–1.00)
GFR, Estimated: 34 mL/min — ABNORMAL LOW (ref >60)
Glucose, Bld: 243 mg/dL — ABNORMAL HIGH (ref 70–99)
Potassium: 4.1 mmol/L (ref 3.5–5.1)
Sodium: 139 mmol/L (ref 135–145)

## 2020-06-12 LAB — TSH: TSH: 46.431 u[IU]/mL — ABNORMAL HIGH (ref 0.350–4.500)

## 2020-06-12 LAB — T4, FREE: Free T4: 0.85 ng/dL (ref 0.61–1.12)

## 2020-06-12 MED ORDER — SERTRALINE HCL 50 MG PO TABS
150.0000 mg | ORAL_TABLET | Freq: Every day | ORAL | Status: DC
  Administered 2020-06-13 – 2020-06-16 (×4): 150 mg via ORAL
  Filled 2020-06-12 (×4): qty 1

## 2020-06-12 MED ORDER — LEVOTHYROXINE SODIUM 25 MCG PO TABS
125.0000 ug | ORAL_TABLET | Freq: Every day | ORAL | Status: DC
  Administered 2020-06-13 – 2020-06-16 (×4): 125 ug via ORAL
  Filled 2020-06-12 (×3): qty 1

## 2020-06-12 MED ORDER — BISACODYL 10 MG RE SUPP
10.0000 mg | Freq: Every day | RECTAL | Status: DC | PRN
  Administered 2020-06-12: 10 mg via RECTAL
  Filled 2020-06-12: qty 1

## 2020-06-12 MED ORDER — PREGABALIN 25 MG PO CAPS: 25.0000 mg | ORAL_CAPSULE | Freq: Two times a day (BID) | ORAL | Status: DC

## 2020-06-12 MED ORDER — PREGABALIN 25 MG PO CAPS
25.0000 mg | ORAL_CAPSULE | Freq: Three times a day (TID) | ORAL | Status: DC
  Administered 2020-06-12 – 2020-06-14 (×7): 25 mg via ORAL
  Filled 2020-06-12 (×8): qty 1

## 2020-06-12 NOTE — Consult Note (Signed)
WOC Nurse Consult Note: Patient receiving care in 5N15. Primary RN in room at time of assessment. Reason for Consult: stage 1 buttocks Wound type: pink discoloration along right gluteal fold.  Patient stated, "someone put me in a diaper and left me in it too long".  This was prior to her arrival to [facility].  The area DOES blanch, the overlying tissue is intact, therefore, it does not meet the criteria of any type of pressure injury at this time. Pressure Injury POA: Yes/No/NA Measurement: Wound bed: Drainage (amount, consistency, odor)  Periwound: Dressing procedure/placement/frequency:  A foam dressing was over the area, and one was over the sacral area as prophylaxis.  Continue prophylactic dressings.  The main approaches to prevent further deterioration include:  Off loading of pressure to the area, avoid the use of incontinence briefs, and our 3 step skin regimen. Thank you for the consult.  Discussed plan of care with the patient and bedside nurse.  [place] nurse will not follow at this time.  Please re-consult the [facility] team if needed.  Val Riles, RN, MSN, CWOCN, CNS-BC, pager [phone]

## 2020-06-12 NOTE — Evaluation (Addendum)
Physical Therapy Evaluation Patient Details Name: Toni Hill MRN: [id number] DOB: 10/24/1945 Today's Date: 06/12/2020   History of Present Illness  Pt is a 74 y.o. female admitted 06/10/20 with c/o chronic RLE pain and paralysis for the past 3 months (which started around the time her distal tib-fib fx hardware from 2010 was removed by Dr. Erlinda Hong on 03/05/20; pt started on Eliquis 05/22/20 due to chronic-appearing blood clot in RLE). Lumbar MRI 12/8 showed L5-S1 stenosis, L4-5 foraminal narrowing. Head MRI negative for acute abnormality. PMH includes chronic back pain, CKD, HTN, OA, DM2, polyneuropathy, Bipolar 1 disorder.    Clinical Impression  Pt presents with an overall decrease in functional mobility secondary to above. PTA, pt reports she has been bedbound the past two months due to significant RLE pain and inability to move it; pt has required totalA from family for transfer to w/c and assist for all ADLs at bed-level. Today, pt requires max-totalA+2 for bed mobility and to sit EOB. Pt with significant RLE pain and contracture in all joints with RLE stuck in hip IR/ABD, knee ext, ankle DF and toe flexion; difficulty performing any PROM beyond minimal knee flexion and toe extension due to tightness/tone. Pt also with bounding R femoral pulse and painful palpation around this. Pt anxious and tearful regarding situation but is hopeful for d/c to SNF for continued therapies. Pt would benefit from continued acute PT services to maximize functional mobility and independence prior to d/c with SNF-level therapies.     Follow Up Recommendations SNF;Supervision for mobility/OOB    Equipment Recommendations  Hospital bed;Hoyer lift   Recommendations for Other Services       Precautions / Restrictions Precautions Precautions: Fall;Other (comment) Precaution Comments: R LE contractures/pain; urinary frequency Restrictions Weight Bearing Restrictions: No      Mobility  Bed Mobility Overal bed  mobility: Needs Assistance Bed Mobility: Rolling;Supine to Sit;Sit to Supine Rolling: Max assist   Supine to sit: Max assist;+2 for physical assistance;+2 for safety/equipment;HOB elevated Sit to supine: Total assist;+2 for physical assistance;+2 for safety/equipment   General bed mobility comments: Max A for rolling to L side for wound care assessment. Pt able to assist in pulling self to EOB using bedrails with R hand primarily, Max A x 2 for safe B LE mgmt and trunk advancement. Total A x 2 to return to bed. With bed in trendelenberg position, pt able to demo ability to pull self up with B UE on headboard without assist    Transfers                 General transfer comment: deferred due to pain. Will need +2 lift assistance for bed <> chair  Ambulation/Gait                Stairs            Wheelchair Mobility    Modified Rankin (Stroke Patients Only)       Balance Overall balance assessment: Needs assistance Sitting-balance support: Bilateral upper extremity supported;Feet supported Sitting balance-Leahy Scale: Poor Sitting balance - Comments: Reliant on B UE support and external support at trunk with posterior lean. Initially Max A progressing to Min A at times Postural control: Posterior lean                                   Pertinent Vitals/Pain Pain Assessment: 0-10 Pain Score: 10-Worst pain ever Pain Location: R  side from ribs down to toes (especially pinky toe today) Pain Descriptors / Indicators: Constant;Sharp;Shooting;Guarding;Grimacing Pain Intervention(s): Monitored during session;Limited activity within patient's tolerance;RN gave pain meds during session    [place] expects to be discharged to:: Private residence Living Arrangements: Spouse/significant other Available Help at Discharge: Family;Available PRN/intermittently Type of Home: House Home Access: Stairs to enter   Entrance Stairs-Number of  Steps: 1 Home Layout: Two level;Laundry or work area in basement;Able to live on main level with bedroom/bathroom;Other (Comment) (Does not use basement) Home Equipment: Wheelchair - [employer] - 2 wheels;Bedside commode Additional Comments: Lives with husband who is also not in great health, but ambulatory. Pt's 92 y/o grandson assisted a lot but has recently started a new job and unable to assist as much. Pt's daughter works from home and assists when she can    Prior Function Level of Independence: Needs assistance   Gait / Transfers Assistance Needed: Heavy assist for all transfers (usually assisted by 52 y/o grandson with autism who is strong per pt). Reports mostly bedbound for past 2 months. Transfers to/from bed, BSC, wheelchair  ADL's / Homemaking Assistance Needed: Reports assistance for sponge bathing, dressing, toileting tasks.        Hand Dominance   Dominant Hand: Left    Extremity/Trunk Assessment   Upper Extremity Assessment Upper Extremity Assessment: RUE deficits/detail;LUE deficits/detail RUE Deficits / Details: WFL 4+/5 strength LUE Deficits / Details: L UE strong 4+/5. Pt reports hx of broken shoulder, flexion WFL but difficulty with horizontal adduction    Lower Extremity Assessment Lower Extremity Assessment: Generalized weakness;RLE deficits/detail RLE Deficits / Details: No active movement noted throughout; conctractures in all joints, notable with hip abduction and ER, knee extension, ankle PF and toe flexion; able to minimally passively flex R knee with tone noted, but any PROM attempts limited by significant pain; also noted bounding R femoral pulse with pubic/pelvic/hip muscles extremely taught RLE: Unable to fully assess due to pain RLE Coordination: decreased gross motor;decreased fine motor    Cervical / Trunk Assessment Cervical / Trunk Assessment: Kyphotic;Other exceptions Cervical / Trunk Exceptions: weak musculature in back, difficulty with  trunk flexion (notes significant hamstring/lower back tightness)  Communication   Communication: No difficulties  Cognition Arousal/Alertness: Awake/alert Behavior During Therapy: WFL for tasks assessed/performed;Anxious Overall Cognitive Status: Impaired/Different from baseline Area of Impairment: Attention;Memory;Problem solving                   Current Attention Level: Selective Memory: Decreased short-term memory       Problem Solving: Difficulty sequencing;Requires verbal cues General Comments: Pt pleasant but expectedly anxious about moving R LE due to pain. Pt endorses difficulty with memory, tangential at times but able to be redirected      General Comments General comments (skin integrity, edema, etc.): Pt tearful and anxious during session, but responds well to encouragement. Pt reports desire for SNF rehab    Exercises Other Exercises Other Exercises: Poor tolerance to attempts at PROM throughout RLE   Assessment/Plan    PT Assessment Patient needs continued PT services  PT Problem List Decreased strength;Decreased range of motion;Decreased activity tolerance;Decreased balance;Decreased mobility;Decreased knowledge of use of DME;Pain;Impaired tone;Decreased skin integrity       PT Treatment Interventions DME instruction;Gait training;Stair training;Functional mobility training;Therapeutic activities;Therapeutic exercise;Balance training;Patient/family education;Wheelchair mobility training    PT Goals (Current goals can be found in the Care Plan section)  Acute Rehab PT Goals Patient Stated Goal: Post-acute rehab at SNF PT Goal Formulation:  With patient Time For Goal Achievement: 06/26/20 Potential to Achieve Goals: Good    Frequency Min 2X/week   Barriers to discharge Decreased caregiver support      Co-evaluation PT/OT/SLP Co-Evaluation/Treatment: Yes Reason for Co-Treatment: Complexity of the patient's impairments (multi-system involvement);For  patient/therapist safety;To address functional/ADL transfers PT goals addressed during session: Mobility/safety with mobility;Balance         AM-PAC PT "6 Clicks" Mobility  Outcome Measure Help needed turning from your back to your side while in a flat bed without using bedrails?: Total Help needed moving from lying on your back to sitting on the side of a flat bed without using bedrails?: Total Help needed moving to and from a bed to a chair (including a wheelchair)?: Total Help needed standing up from a chair using your arms (e.g., wheelchair or bedside chair)?: Total Help needed to walk in hospital room?: Total Help needed climbing 3-5 steps with a railing? : Total 6 Click Score: 6    End of Session   Activity Tolerance: Patient limited by pain Patient left: in bed;with call bell/phone within reach;with bed alarm set Nurse Communication: Mobility status;Other (comment) (need to to turn pt after she's done eating) PT Visit Diagnosis: Other abnormalities of gait and mobility (R26.89);Pain;Muscle weakness (generalized) (M62.81) Pain - Right/Left: Right Pain - part of body: Hip;Leg;Knee;Ankle and joints of foot    Time: 0820-0852 PT Time Calculation (min) (ACUTE ONLY): 32 min   Charges:   PT Evaluation $PT Eval Moderate Complexity: [place], PT, DPT Acute Rehabilitation Services  Pager [phone] Office [phone]  Derry Lory 06/12/2020, 12:27 PM

## 2020-06-12 NOTE — TOC Initial Note (Addendum)
Transition of Care [facility]) - Initial/Assessment Note    Patient Details  Name: Toni Hill MRN: [id number] Date of Birth: 01-15-1946  [facility]) CM/SW Contact:    Sharin Mons, RN Phone Number: 06/12/2020, 2:31 PM  Clinical Narrative:                 Presents with chronic right leg pain, hx of HTN, HLD, T2DM, CKD stage III, diabetic polyneuropathy, and right tib-fib fracture s/p ORIF (2010). From home with husband who is chronically ill. States husband can min. assist with care.  States she has been bed bound primarily for the last 2 months.  RNCM received consult for possible SNF placement at time of discharge. RNCM spoke with patient regarding PT recommendation of SNF placement at time of discharge. Patient reported that patient's spouse is currently unable to care for patient at their home given patient's current physical needs and fall risk. Patient expressed understanding of PT recommendation and is agreeable to SNF placement at time of discharge. Patient reports preference for  [facility] only. RNCM discussed insurance authorization process and provided Medicare SNF ratings list. Patient expressed being hopeful for rehab and to feel better soon. No further questions reported at this time. RNCM to continue to follow and assist with discharge planning needs.  Pt is not COVID vaccinated.  Expected Discharge Plan: [place] (from hom with husband) Barriers to Discharge: Continued Medical Work up   Patient Goals and CMS Choice Patient states their goals for this hospitalization and ongoing recovery are:: to get better CMS Medicare.gov Compare Post Acute Care list provided to:: Patient    Expected Discharge Plan and Services Expected Discharge Plan: [place] (from hom with husband)   Discharge Planning Services: CM Consult   Living arrangements for the past 2 months: Single Family Home                                       Prior Living Arrangements/Services Living arrangements for the past 2 months: Single Family Home Lives with:: Spouse Patient language and need for interpreter reviewed:: Yes Do you feel safe going back to the place where you live?: Yes      Need for Family Participation in Patient Care: Yes (Comment) Care giver support system in place?: No (comment)   Criminal Activity/Legal Involvement Pertinent to Current Situation/Hospitalization: No - Comment as needed  Activities of Daily Living Home Assistive Devices/Equipment: Wheelchair ADL Screening (condition at time of admission) Patient's cognitive ability adequate to safely complete daily activities?: Yes Is the patient deaf or have difficulty hearing?: No Does the patient have difficulty seeing, even when wearing glasses/contacts?: No Does the patient have difficulty concentrating, remembering, or making decisions?: No Patient able to express need for assistance with ADLs?: Yes Does the patient have difficulty dressing or bathing?: Yes Independently performs ADLs?: Yes (appropriate for developmental age) Does the patient have difficulty walking or climbing stairs?: Yes Weakness of Legs: Both Weakness of Arms/Hands: Both  Permission Sought/Granted   Permission granted to share information with : Yes, Verbal Permission Granted  Share Information with NAME: Mikey Kirschner (Daughter) [phone] , [facility]) [phone]           Emotional Assessment Appearance:: Appears stated age Attitude/Demeanor/Rapport: Engaged Affect (typically observed): Accepting Orientation: : Oriented to Self,Oriented to Place,Oriented to  Time,Oriented to Situation Alcohol / Substance Use: Not Applicable Psych  Involvement: No (comment)  Admission diagnosis:  Right ankle pain [M25.571] Weakness of right lower extremity [R29.898] Pain of right lower extremity [M79.604] Patient Active Problem List   Diagnosis Date Noted  . Right ankle  pain 06/11/2020  . paralysis of right leg 06/11/2020  . Pain of right lower extremity   . Weakness of right lower extremity   . Constipation 06/06/2020  . Deep vein thrombosis (DVT) of proximal vein of right lower extremity ([place]) 05/26/2020  . Painful orthopaedic hardware ([place]) 01/29/2020  . Protein-calorie malnutrition, severe 01/04/2020  . Diarrhea 01/04/2020  . Intractable nausea and vomiting 01/02/2020  . Nausea & vomiting 01/01/2020  . Preoperative evaluation to rule out surgical contraindication 11/19/2019  . Bilateral low back pain with sciatica 09/06/2019  . Peripheral neuropathy 09/06/2019  . Seborrheic keratoses 08/20/2019  . Left breast mass 08/20/2019  . History of left knee replacement 05/29/2019  . Macrocytic anemia 03/08/2019  . UTI (urinary tract infection) 03/04/2019  . Pain of left calf 01/26/2019  . Acute kidney injury ([place]) 01/25/2019  . Leg swelling 01/25/2019  . QT prolongation 01/25/2019  . Elevated d-dimer 01/23/2019  . Acute blood loss as cause of postoperative anemia 01/23/2019  . Cellulitis of leg, left 01/23/2019  . At risk for adverse drug event 01/22/2019  . Bipolar 1 disorder ([place])   . 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 Osteoarthritis of left knee 12/30/2018  . Anxiety 12/30/2018  . Stage 3 chronic kidney disease 06/28/2016  . Hypercholesterolemia 08/20/2015  . Type 2 diabetes mellitus ([place]) 02/28/2014  . Esophageal reflux 02/28/2014  . Stress incontinence, female 02/28/2014  . Hypothyroidism (acquired) 02/28/2014  . Essential hypertension 11/17/2011   PCP:  Maudie Mercury, MD Pharmacy:   [id number] [place], [place] [address] [place] [address] Phone: [phone] Fax: [phone]  [id number] [place], [place] - [address] [place] [address] Phone: [phone] Fax: [phone]  [id number]  - [address] [place] [address] Phone: [phone] Fax: [phone]     Social Determinants of Health (SDOH) Interventions    Readmission Risk Interventions No flowsheet data found.

## 2020-06-12 NOTE — NC FL2 (Signed)
[place] LEVEL OF CARE SCREENING TOOL     IDENTIFICATION  Patient Name: Toni Hill Birthdate: 14-Jan-1946 Sex: female Admission Date (Current Location): 06/10/2020  [facility] and [place] Number:  Herbalist and Address:  [facility], [place] [address] [place], [place] [address]      Provider Number: [id number]  Attending Physician Name and Address:  Velna Ochs, MD  Relative Name and Phone Number:       Current Level of Care: Hospital Recommended Level of Care: [place] Prior Approval Number:    Date Approved/Denied:   PASRR Number: [id number]  Discharge Plan: SNF    Current Diagnoses: Patient Active Problem List   Diagnosis Date Noted  . Right leg weakness 06/12/2020  . Right ankle pain 06/11/2020  . paralysis of right leg 06/11/2020  . Pain of right lower extremity   . Weakness of right lower extremity   . Constipation 06/06/2020  . Deep vein thrombosis (DVT) of proximal vein of right lower extremity ([place]) 05/26/2020  . Painful orthopaedic hardware ([place]) 01/29/2020  . Protein-calorie malnutrition, severe 01/04/2020  . Diarrhea 01/04/2020  . Intractable nausea and vomiting 01/02/2020  . Nausea & vomiting 01/01/2020  . Preoperative evaluation to rule out surgical contraindication 11/19/2019  . Bilateral low back pain with sciatica 09/06/2019  . Peripheral neuropathy 09/06/2019  . Seborrheic keratoses 08/20/2019  . Left breast mass 08/20/2019  . History of left knee replacement 05/29/2019  . Macrocytic anemia 03/08/2019  . UTI (urinary tract infection) 03/04/2019  . Pain of left calf 01/26/2019  . Acute kidney injury ([place]) 01/25/2019  . Leg swelling 01/25/2019  . QT prolongation 01/25/2019  . Elevated d-dimer 01/23/2019  . Acute blood loss as cause of postoperative anemia 01/23/2019  . Cellulitis of leg, left 01/23/2019  . At risk for adverse drug event 01/22/2019  . Bipolar 1 disorder  ([place])   . 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 Osteoarthritis of left knee 12/30/2018  . Anxiety 12/30/2018  . Stage 3 chronic kidney disease 06/28/2016  . Hypercholesterolemia 08/20/2015  . Type 2 diabetes mellitus ([place]) 02/28/2014  . Esophageal reflux 02/28/2014  . Stress incontinence, female 02/28/2014  . Hypothyroidism (acquired) 02/28/2014  . Essential hypertension 11/17/2011    Orientation RESPIRATION BLADDER Height & Weight     Self,Time,Situation,Place  Normal External catheter Weight:   Height:     BEHAVIORAL SYMPTOMS/MOOD NEUROLOGICAL BOWEL NUTRITION STATUS      Continent Diet (REFER TO D/C SUMMARY)  AMBULATORY STATUS COMMUNICATION OF NEEDS Skin   Extensive Assist Verbally PU Stage and Appropriate Care (stage 1 buttocks/ foam drsg) PU Stage 1 Dressing: Daily                     Personal Care Assistance Level of Assistance  Bathing,Feeding,Dressing Bathing Assistance: Maximum assistance Feeding assistance: Independent Dressing Assistance: Maximum assistance     Functional Limitations Info  Sight,Speech,Hearing Sight Info: Adequate Hearing Info: Adequate Speech Info: Adequate    SPECIAL CARE FACTORS FREQUENCY  PT (By licensed PT),OT (By licensed OT)     PT Frequency: 5x / week, evaluate and treat OT Frequency: 5x / week, evaluate and treat            Contractures      Additional Factors Info  Code Status,Allergies Code Status Info: Full code Allergies Info: Bactrim (Sulfamethoxazole-trimethoprim), Cephalexin, Gabapentin, Morphine And Related, Vioxx (Rofecoxib), Penicillins  Current Medications (06/12/2020):  This is the current hospital active medication list Current Facility-Administered Medications  Medication Dose Route Frequency Provider Last Rate Last Admin  . acetaminophen (TYLENOL) tablet 650 mg  650 mg Oral Q6H PRN Madalyn Rob, MD   650 mg at 06/12/20 6063   Or  . acetaminophen (TYLENOL) suppository 650 mg   650 mg Rectal Q6H PRN Madalyn Rob, MD      . amLODipine (NORVASC) tablet 5 mg  5 mg Oral Daily Madalyn Rob, MD   5 mg at 06/12/20 0160  . apixaban (ELIQUIS) tablet 5 mg  5 mg Oral BID Madalyn Rob, MD   5 mg at 06/12/20 1093  . bisacodyl (DULCOLAX) suppository 10 mg  10 mg Rectal Daily PRN Velna Ochs, MD      . calcium-vitamin D (OSCAL WITH D) 500-200 MG-UNIT per tablet 1 tablet  1 tablet Oral Q breakfast Madalyn Rob, MD   1 tablet at 06/12/20 [phone]  . cholecalciferol (VITAMIN D3) tablet 2,000 Units  2,000 Units Oral Daily Madalyn Rob, MD   2,000 Units at 06/12/20 [phone]  . ezetimibe (ZETIA) tablet 10 mg  10 mg Oral QPM Madalyn Rob, MD      . HYDROcodone-acetaminophen (NORCO/VICODIN) 5-325 MG per tablet 1 tablet  1 tablet Oral Q6H PRN Jose Persia, MD   1 tablet at 06/12/20 1200  . insulin aspart (novoLOG) injection 0-5 Units  0-5 Units Subcutaneous QHS Madalyn Rob, MD      . insulin aspart (novoLOG) injection 0-9 Units  0-9 Units Subcutaneous TID WC Madalyn Rob, MD   3 Units at 06/12/20 1247  . insulin detemir (LEVEMIR) injection 10 Units  10 Units Subcutaneous QHS Madalyn Rob, MD   10 Units at 06/11/20 2252  . [START ON 06/13/2020] levothyroxine (SYNTHROID) tablet 125 mcg  125 mcg Oral Daily Jeralyn Bennett, MD      . pantoprazole (PROTONIX) EC tablet 40 mg  40 mg Oral Daily Madalyn Rob, MD   40 mg at 06/12/20 0254  . polyethylene glycol (MIRALAX / GLYCOLAX) packet 17 g  17 g Oral Daily PRN Madalyn Rob, MD      . pregabalin (LYRICA) capsule 25 mg  25 mg Oral TID Jeralyn Bennett, MD   25 mg at 06/12/20 1041  . senna-docusate (Senokot-S) tablet 1 tablet  1 tablet Oral QHS PRN Madalyn Rob, MD      . sertraline (ZOLOFT) tablet 100 mg  100 mg Oral Daily Madalyn Rob, MD   100 mg at 06/12/20 2706  . simvastatin (ZOCOR) tablet 40 mg  40 mg Oral QPM Madalyn Rob, MD   40 mg at 06/11/20 1816  . vitamin B-12 (CYANOCOBALAMIN) tablet 1,000 mcg  1,000 mcg Oral Daily Madalyn Rob, MD    1,000 mcg at 06/12/20 2376     Discharge Medications: Please see discharge summary for a list of discharge medications.  Relevant Imaging Results:  Relevant Lab Results:   Additional Information [id number] [place], [place]

## 2020-06-12 NOTE — Progress Notes (Signed)
   Subjective: HD#2 Patient states that her toe pain is significantly worse than it has been chronically in the past. Her nurse told her she has a small red spot on her toe which may be causing some of her symptoms. She also endorses right-sided rib pain. She says she took a pain pill but it did not improve her pain. She is concerned there is not a known cause for her continued symptoms. She notes she occasionally is unable to open one of her eyes in the morning and is unsure why. She says physical therapy has been going okay; however, she says she has a history of fracture in her arm and was unable to move her arm overhead. She continues to say she cannot move her left leg at all and endorses hip pain when her left leg is moved. Denies any troubles moving her left leg. She states her thyroid medication was decreased to 100mg  about 3 months ago and she has been taking it regularly. She states she has taken Lyrica in the past. She states her anxiety is not under control, worried she may never walk again.    Objective:  Vital signs in last 24 hours: Vitals:   06/11/20 1830 06/11/20 1930 06/11/20 2053 06/12/20 0436  BP: (!) 118/46 (!) 96/47 98/76 (!) 107/50  Pulse: 73 66 74 66  Resp: 20 14 16 17   Temp:   98.9 F (37.2 C) (!) 97.5 F (36.4 C)  TempSrc:   Oral Oral  SpO2: 99% 95% 100% 98%  Physical exam: General: well developed, well nourished elderly female sitting comfortably in bed, NAD CV: RRR, S1, S2 normal, No m/r/g Pulmonary: CTAB, normal effort Abdomen: Soft, non-tender, non distended Extremities: R. Foot digital contractures. Pain to palpation, passive movement R leg, unable to flex hip. Hoover test+ Psych: Normal mood and affect    Assessment/Plan:  Ms. Litts is a 74 yo F with a PMHx of HTN, HLD, Type II DM, CKD IIIb, hypothyroidism, GERD, RLE DVT on anticoagulation, left total knee replacement, right tib-fib fracture s/p ORIF in 2010, followed by hardware removal on 03/05/2020 due  to pain, anxiety, who presented with persistent right lower extremity weakness and pain.    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 Precipitating factor might be her ongoing pain, further leading to weakness/ Unilateral, hemiparetic symptoms. Hoover sign is positive -when asked pt to concentrate on good leg, resistence felt in other leg. Gabapentin is not controlling pain at this time which sounds like neuropathic pain, will start Lyrica. Psych has been consulted and they plan to follow up.  - D/C Gabapentin - Start Lyrica 25 mg TID   T2DM w/polyneuropathy HbA1c is 7.9, was 9, six months ago. On levemir 15 units qhs and SSI at home.  -C/w levemir 10 units qhs - SSI -Lyrica 25 mg TID  Hx of Vitamin B12 deficiency Current Vitamin B12 level 540 .   -C/W B12 106mcg daily   Prior to Admission Living Arrangement:Home Anticipated Discharge Location:Home vs SNF Barriers to Discharge: Pending disposition Dispo: Anticipated discharge in approximately 1-2 day(s).   [name], MD 06/12/2020, 2:53 PM Pager: [phone] After 5pm on weekdays and 1pm on weekends: On Call pager [phone]

## 2020-06-12 NOTE — Evaluation (Signed)
Occupational Therapy Evaluation Patient Details Name: Toni Hill MRN: [id number] DOB: 1945/08/08 Today's Date: 06/12/2020    History of Present Illness Pt is a 74 y.o. female admitted 06/10/20 with c/o chronic RLE pain and paralysis for the past 3 months (which started around the time her distal tib-fib fx hardware from 2010 was removed by Dr. Erlinda Hong on 03/05/20; pt started on Eliquis 05/22/20 due to chronic-appearing blood clot in RLE). Lumbar MRI 12/8 showed L5-S1 stenosis, L4-5 foraminal narrowing. Head MRI negative for acute abnormality. PMH includes chronic back pain, CKD, HTN, OA, DM2, polyneuropathy, Bipolar 1 disorder.   Clinical Impression   PTA, pt reports being bed-bound for the past 2 months, requiring assistance for all ADLs and heavy assist for transfers to/from bed, wheelchair and BSC. Pt with high levels of pain in R LE, contractures at all joints of R LE and inability to actively move R LE, but agreeable to attempt EOB activities. Pt demonstrates good UB strength to assist with bed mobility but requires extensive +2 assist to transition to/from EOB. Pt requires Max A up to [place] A to maintain sitting balance EOB with posterior lean. Pt requires Mod A for UB ADLs and Total A for LB ADLs at this time. Pt tearful and anxious about her situation, but reports desire to go to rehab. Recommend SNF for short term rehab.     Follow Up Recommendations  SNF;Supervision/Assistance - 24 hour    Equipment Recommendations  Other (comment) (to be determined; defer to next venue)    Recommendations for Other Services       Precautions / Restrictions Precautions Precautions: Fall;Other (comment) Precaution Comments: R LE contractures/pain Restrictions Weight Bearing Restrictions: No      Mobility Bed Mobility Overal bed mobility: Needs Assistance Bed Mobility: Rolling;Supine to Sit;Sit to Supine Rolling: Max assist   Supine to sit: Max assist;+2 for physical assistance;+2 for  safety/equipment;HOB elevated Sit to supine: Total assist;+2 for physical assistance;+2 for safety/equipment   General bed mobility comments: Max A for rolling to L side for wound care assessment. Pt able to assist in pulling self to EOB using bedrails with R hand primarily, Max A x 2 for safe B LE mgmt and trunk advancement. Total A x 2 to return to bed. With bed in trendelenberg position, pt able to demo ability to pull self up with B UE on headboard without assist    Transfers                 General transfer comment: deferred due to pain. Will need +2 lift assistance for bed <> chair    Balance Overall balance assessment: Needs assistance Sitting-balance support: Bilateral upper extremity supported;Feet supported Sitting balance-Leahy Scale: Poor Sitting balance - Comments: Reliant on B UE support and external support at trunk with posterior lean. Initially Max A progressing to Min A at times Postural control: Posterior lean                                 ADL either performed or assessed with clinical judgement   ADL Overall ADL's : Needs assistance/impaired Eating/Feeding: Set up;Bed level   Grooming: Set up;Bed level   Upper Body Bathing: Moderate assistance;Bed level   Lower Body Bathing: Total assistance;Bed level   Upper Body Dressing : Minimal assistance;Bed level Upper Body Dressing Details (indicate cue type and reason): Min A to don new hospital gown bed level Lower Body Dressing:  Total assistance;Bed level       Toileting- Clothing Manipulation and Hygiene: Total assistance;Bed level         General ADL Comments: Pt with R LE pain, contracture deformities that have worsened over the past 2 months     Vision Baseline Vision/History: Wears glasses Wears Glasses: At all times Patient Visual Report: No change from baseline Vision Assessment?: No apparent visual deficits     Perception     Praxis      Pertinent Vitals/Pain Pain  Assessment: 0-10 Pain Score: 10-Worst pain ever Pain Location: R side from ribs down to feet Pain Descriptors / Indicators: Constant;Sharp;Shooting Pain Intervention(s): Monitored during session;Repositioned;RN gave pain meds during session     Hand Dominance Left   Extremity/Trunk Assessment Upper Extremity Assessment Upper Extremity Assessment: LUE deficits/detail;RUE deficits/detail RUE Deficits / Details: WFL 4+/5 strength LUE Deficits / Details: L UE strong 4+/5. Pt reports hx of broken shoulder, flexion WFL but difficulty reaching across body   Lower Extremity Assessment Lower Extremity Assessment: Defer to PT evaluation   Cervical / Trunk Assessment Cervical / Trunk Assessment: Kyphotic;Other exceptions Cervical / Trunk Exceptions: weak musculature in back, difficulty with trunk flexion   Communication Communication Communication: No difficulties   Cognition Arousal/Alertness: Awake/alert Behavior During Therapy: WFL for tasks assessed/performed;Anxious Overall Cognitive Status: Impaired/Different from baseline Area of Impairment: Attention;Memory;Problem solving                   Current Attention Level: Selective Memory: Decreased short-term memory       Problem Solving: Difficulty sequencing;Requires verbal cues General Comments: Pt pleasant but expectedly anxious about moving R LE due to pain. Pt endorses difficulty with memory, tangential at times but able to be redirected   General Comments  Pt tearful and anxious during session, but responds well to encouragement. Pt reports desire for SNF rehab    Exercises     Shoulder Instructions      Home Living Family/patient expects to be discharged to:: Private residence Living Arrangements: Spouse/significant other Available Help at Discharge: Family;Available PRN/intermittently Type of Home: House Home Access: Stairs to enter Entrance Stairs-Number of Steps: 1   Home Layout: Two level;Laundry or  work area in basement;Able to live on main level with bedroom/bathroom;Other (Comment) (doesnt use basement)     Bathroom Shower/Tub: Teacher, early years/pre: Handicapped height     Home Equipment: Wheelchair - [employer] - 2 wheels;Bedside commode   Additional Comments: Lives with husband who is also not in great health, but ambulatory. Pt's 30 y/o grandson assisted a lot but has recently started a new job and unable to assist as much. Pt's daughter works from home and assists when she can      Prior Functioning/Environment Level of Independence: Needs assistance  Gait / Transfers Assistance Needed: Heavy assist for all transfers (usually assisted by 33 y/o son with autism who is strong per pt). Reports mostly bedbound for past 2 months. Transfers to/from bed, BSC, wheelchair ADL's / Homemaking Assistance Needed: Reports assistance for sponge bathing, dressing, toileting tasks.            OT Problem List: Decreased strength;Decreased activity tolerance;Impaired balance (sitting and/or standing);Decreased range of motion;Decreased knowledge of use of DME or AE;Decreased knowledge of precautions;Impaired tone;Pain      OT Treatment/Interventions: Self-care/ADL training;Therapeutic exercise;DME and/or AE instruction;Energy conservation;Manual therapy;Therapeutic activities;Patient/family education;Balance training    OT Goals(Current goals can be found in the care plan section) Acute Rehab OT Goals Patient  Stated Goal: go to rehab OT Goal Formulation: With patient Time For Goal Achievement: 06/26/20 Potential to Achieve Goals: Good ADL Goals Pt Will Perform Grooming: with modified independence;sitting Pt Will Perform Upper Body Bathing: with set-up;bed level;sitting Pt Will Perform Lower Body Bathing: with mod assist;bed level;sitting/lateral leans Pt Will Transfer to Toilet: with mod assist;bedside commode Additional ADL Goal #1: Pt to demonstrate ability to sit EOB  for > 5 minutes with no more than Supervision required to maintain balance to maximize participation in ADLs  OT Frequency: Min 2X/week   Barriers to D/C:            Co-evaluation PT/OT/SLP Co-Evaluation/Treatment: Yes Reason for Co-Treatment: Complexity of the patient's impairments (multi-system involvement);For patient/therapist safety;To address functional/ADL transfers   OT goals addressed during session: ADL's and self-care;Other (comment) (bed mobility)      AM-PAC OT "6 Clicks" Daily Activity     Outcome Measure Help from another person eating meals?: A Little Help from another person taking care of personal grooming?: A Little Help from another person toileting, which includes using toliet, bedpan, or urinal?: Total Help from another person bathing (including washing, rinsing, drying)?: Total Help from another person to put on and taking off regular upper body clothing?: A Little Help from another person to put on and taking off regular lower body clothing?: Total 6 Click Score: 12   End of Session Nurse Communication: Mobility status;Need for lift equipment  Activity Tolerance: Patient limited by pain Patient left: in bed;with call bell/phone within reach;with bed alarm set  OT Visit Diagnosis: Unsteadiness on feet (R26.81);Other abnormalities of gait and mobility (R26.89);Muscle weakness (generalized) (M62.81);Pain Pain - Right/Left: Right Pain - part of body: Knee;Ankle and joints of foot;Leg;Hip                Time: 5631-4970 OT Time Calculation (min): 32 min Charges:  OT General Charges $OT Visit: 1 Visit OT Evaluation $OT Eval Moderate Complexity: 1 Mod  Layla Maw, OTR/L  Layla Maw 06/12/2020, 9:23 AM

## 2020-06-12 NOTE — Consult Note (Signed)
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 female patient admitted with pain in the leg, new diagnosis of conversion disorder. Psych consult was placed as there is a concern that her depression and anxiety maybe playing a role in her current diagnosis. Patient does endorse some neurovegative depressive symptoms fatigue, decreased appetite, low energy, heightened pain, and hopelessness. She states she has been on the same dose of zoloft for several years, too far to remember. She denies any other previous psychotropic medications. She denies any inpatient admission. She denies any previous suicide attempt or current suicidal thoughts. She reports having a lengthy conversation with her doctor, who told her there is some hope and that she can get therapy. SHe appears very excited about her first PT session she completed earlier this morning. She shows me a picture of her granddaughter prior to leaving the room.   HPI: Toni Hill is a 74 yo F with a PMHx of HTN, HLD, Type II DM, CKD IIIb, hypothyroidism, GERD, RLE DVT on anticoagulation, left total knee replacement, right tib-fib fracture s/p ORIF in 2010, followed by hardware removal on 03/05/2020 due to pain, anxiety, who presented with persistent right lower extremity weakness and pain.  Past Psychiatric History:Depression and anxiety. CUrrently taking zoloft 100mg  po daily for several years possibly more than 20. She denies any current outpatient  behavioral health providers. She denies any previous inpatient behavioral health admission. She denies any substance abuse history or legal charges.   Risk to Self:  Denies Risk to Others:  Denies Prior Inpatient Therapy:  Denies Prior Outpatient Therapy:  Denies  Past Medical History:  Past Medical History:  Diagnosis Date  . Bilateral lower extremity edema    right > left  . Bipolar 1 disorder ([place])   . CKD (chronic kidney disease) stage 3, GFR 30-59 ml/min (HCC)     Dr Lawson Radar, [facility]  . Diabetes, polyneuropathy ([place])    FEET AND FINGER TIPS  . Fibromyalgia   . GERD (gastroesophageal reflux disease)   . History of chronic gastritis   . History of COVID-24 Aug 2019  . Hyperlipidemia   . Hypertension    per pt was take off bp medication because of kidney disease told by her nephrologist  . Hypothyroidism   . Mixed incontinence urge and stress   . OA (osteoarthritis)    KNEES  . OAB (overactive bladder)   . PONV (postoperative nausea and vomiting)   . 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 Type 2 diabetes mellitus treated with insulin ([place])   . Wears glasses     Past Surgical History:  Procedure Laterality Date  . CHOLECYSTECTOMY OPEN  1990's  . COLONOSCOPY  last one 07-17-2014  . CYSTOSCOPY WITH INJECTION N/A 10/29/2016   Procedure: CYSTOSCOPY WITH INJECTION BOTOX 100 UNITS, urethral dilation;  Surgeon: Carolan Clines, MD;  Location: [facility];  Service: Urology;  Laterality: N/A;  . ESOPHAGOGASTRODUODENOSCOPY  last one 07-01-2016  . EXCISIONAL BREAST  BX  1990   BENIGN  . HARDWARE REMOVAL Right 03/05/2020   Procedure: HARDWARE REMOVAL RIGHT ANKLE;  Surgeon: Leandrew Koyanagi, MD;  Location: [facility];  Service: Orthopedics;  Laterality: Right;  . ORIF TIBIA & FIBULA FRACTURES  2010   retained rod  . TONSILLECTOMY  age 5  . TOTAL KNEE ARTHROPLASTY Left 01/15/2019   Procedure: LEFT TOTAL KNEE ARTHROPLASTY;   Surgeon: Leandrew Koyanagi, MD;  Location: [facility];  Service: Orthopedics;  Laterality: Left;  Marland Kitchen VAGINAL HYSTERECTOMY  1979  . VENTRAL HERNIA REPAIR  1990's   Family History:  Family History  Problem Relation Age of Onset  . Breast cancer Mother 23  . Cancer Father        ? type  . Breast cancer Sister 71  . Breast cancer Maternal Grandmother        ? age   Family Psychiatric  History: Noncontributory  Social History:  Social History   Substance and Sexual Activity  Alcohol Use No     Social History   Substance and Sexual Activity  Drug Use No    Social History   Socioeconomic History  . Marital status: Married    Spouse name: Not on file  . Number of children: Not on file  . Years of education: Not on file  . Highest education level: Not on file  Occupational History  . Not on file  Tobacco Use  . Smoking status: Never Smoker  . Smokeless tobacco: Never Used  Vaping Use  . Vaping Use: Never used  Substance and Sexual Activity  . Alcohol use: No  . Drug use: No  . Sexual activity: Not Currently  Other Topics Concern  . Not on file  Social History Narrative  . Not on file   Social Determinants of Health   Financial Resource Strain: Not on file  Food Insecurity: Not on file  Transportation Needs: Not on file  Physical Activity: Not on file  Stress: Not on file  Social Connections: Not on file   Additional Social History:    Allergies:   Allergies  Allergen Reactions  . 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 Strongly recommend against using this antibiotic in the future.   . Cephalexin Itching  . Gabapentin     Kidney issue  . Morphine And Related Nausea And Vomiting  . Vioxx [Rofecoxib]     GI bleeding  . Penicillins Hives and Itching    Has patient had a PCN reaction causing immediate rash, facial/tongue/throat swelling, SOB or lightheadedness with hypotension: No Has patient had a PCN  reaction causing severe rash involving mucus membranes or skin necrosis: No Has patient had a PCN reaction that required hospitalization: no Has patient had a PCN reaction occurring within the last 10 years: No If all of the above answers are "NO", then may proceed with Cephalosporin use.     Labs:  Results for orders placed or performed during the hospital encounter of 06/10/20 (from the past 48 hour(s))  Resp Panel by RT-PCR (Flu A&B, Covid) Nasopharyngeal Swab     Status: None   Collection Time: 06/10/20 10:38 PM   Specimen: Nasopharyngeal Swab; Nasopharyngeal(NP) swabs in vial transport medium  Result Value Ref Range   SARS Coronavirus 2 by RT PCR NEGATIVE NEGATIVE    Comment: (NOTE) SARS-CoV-2 target nucleic acids are NOT DETECTED.  The SARS-CoV-2 RNA is generally detectable in upper respiratory specimens during  the acute phase of infection. The lowest concentration of SARS-CoV-2 viral copies this assay can detect is 138 copies/mL. A negative result does not preclude SARS-Cov-2 infection and should not be used as the sole basis for treatment or other patient management decisions. A negative result may occur with  improper specimen collection/handling, submission of specimen other than nasopharyngeal swab, presence of viral mutation(s) within the areas targeted by this assay, and inadequate number of viral copies(<138 copies/mL). A negative result must be combined with clinical observations, patient history, and epidemiological information. The expected result is Negative.  Fact Sheet for Patients:  EntrepreneurPulse.com.au  Fact Sheet for Healthcare Providers:  IncredibleEmployment.be  This test is no t yet approved or cleared by the [place] FDA and  has been authorized for detection and/or diagnosis of SARS-CoV-2 by FDA under an Emergency Use Authorization (EUA). This EUA will remain  in effect (meaning this test can be used) for  the duration of the COVID-19 declaration under Section 564(b)(1) of the Act, 21 U.S.C.section 360bbb-3(b)(1), unless the authorization is terminated  or revoked sooner.       Influenza A by PCR NEGATIVE NEGATIVE   Influenza B by PCR NEGATIVE NEGATIVE    Comment: (NOTE) The Xpert Xpress SARS-CoV-2/FLU/RSV plus assay is intended as an aid in the diagnosis of influenza from Nasopharyngeal swab specimens and should not be used as a sole basis for treatment. Nasal washings and aspirates are unacceptable for Xpert Xpress SARS-CoV-2/FLU/RSV testing.  Fact Sheet for Patients: EntrepreneurPulse.com.au  Fact Sheet for Healthcare Providers: IncredibleEmployment.be  This test is not yet approved or cleared by the [place] FDA and has been authorized for detection and/or diagnosis of SARS-CoV-2 by FDA under an Emergency Use Authorization (EUA). This EUA will remain in effect (meaning this test can be used) for the duration of the COVID-19 declaration under Section 564(b)(1) of the Act, 21 U.S.C. section 360bbb-3(b)(1), unless the authorization is terminated or revoked.  Performed at [facility], [place] [address] [place], [place] [address]   Basic metabolic panel     Status: Abnormal   Collection Time: 06/10/20 10:39 PM  Result Value Ref Range   Sodium 140 135 - 145 mmol/L   Potassium 4.4 3.5 - 5.1 mmol/L   Chloride 99 98 - 111 mmol/L   CO2 28 22 - 32 mmol/L   Glucose, Bld 205 (H) 70 - 99 mg/dL    Comment: Glucose reference range applies only to samples taken after fasting for at least 8 hours.   BUN 23 8 - 23 mg/dL   Creatinine, Ser 1.58 (H) 0.44 - 1.00 mg/dL   Calcium 9.9 8.9 - 10.3 mg/dL   GFR, Estimated 34 (L) >60 mL/min    Comment: (NOTE) Calculated using the CKD-EPI Creatinine Equation (2021)    Anion gap 13 5 - 15    Comment: Performed at [facility] [address] [place], [place] [address]  CBC with Differential      Status: None   Collection Time: 06/10/20 10:39 PM  Result Value Ref Range   WBC 6.5 4.0 - 10.5 K/uL   RBC 3.99 3.87 - 5.11 MIL/uL   Hemoglobin 13.0 12.0 - 15.0 g/dL   HCT 39.9 36.0 - 46.0 %   MCV 100.0 80.0 - 100.0 fL   MCH 32.6 26.0 - 34.0 pg   MCHC 32.6 30.0 - 36.0 g/dL   RDW 13.2 11.5 - 15.5 %   Platelets 351 150 - 400 K/uL  nRBC 0.0 0.0 - 0.2 %   Neutrophils Relative % 58 %   Neutro Abs 3.9 1.7 - 7.7 K/uL   Lymphocytes Relative 30 %   Lymphs Abs 1.9 0.7 - 4.0 K/uL   Monocytes Relative 9 %   Monocytes Absolute 0.6 0.1 - 1.0 K/uL   Eosinophils Relative 2 %   Eosinophils Absolute 0.1 0.0 - 0.5 K/uL   Basophils Relative 1 %   Basophils Absolute 0.0 0.0 - 0.1 K/uL   Immature Granulocytes 0 %   Abs Immature Granulocytes 0.01 0.00 - 0.07 K/uL    Comment: Performed at [facility] [address], [place], [place] [address]  Protime-INR     Status: Abnormal   Collection Time: 06/10/20 10:39 PM  Result Value Ref Range   Prothrombin Time 15.2 11.4 - 15.2 seconds   INR 1.3 (H) 0.8 - 1.2    Comment: (NOTE) INR goal varies based on device and disease states. Performed at [facility], [place] [address] [place], [place] [address]   CK     Status: None   Collection Time: 06/10/20 10:39 PM  Result Value Ref Range   Total CK 95 38 - 234 U/L    Comment: Performed at [facility], [place] [address], [place], [place] [address]  Hemoglobin A1c     Status: Abnormal   Collection Time: 06/10/20 10:39 PM  Result Value Ref Range   Hgb A1c MFr Bld 7.9 (H) 4.8 - 5.6 %    Comment: (NOTE) Pre diabetes:          5.7%-6.4%  Diabetes:              >6.4%  Glycemic control for   <7.0% adults with diabetes    Mean Plasma Glucose 180.03 mg/dL    Comment: Performed at [facility] [address] [place], [place] [address]  CBG monitoring, ED     Status: Abnormal   Collection Time: 06/11/20  2:55 AM  Result Value Ref Range   Glucose-Capillary 169 (H) 70 - 99 mg/dL     Comment: Glucose reference range applies only to samples taken after fasting for at least 8 hours.  CBG monitoring, ED     Status: Abnormal   Collection Time: 06/11/20  7:37 AM  Result Value Ref Range   Glucose-Capillary 339 (H) 70 - 99 mg/dL    Comment: Glucose reference range applies only to samples taken after fasting for at least 8 hours.  Vitamin B12     Status: None   Collection Time: 06/11/20  9:30 AM  Result Value Ref Range   Vitamin B-12 540 180 - 914 pg/mL    Comment: (NOTE) This assay is not validated for testing neonatal or myeloproliferative syndrome specimens for Vitamin B12 levels. Performed at [facility], [place] [address], [place], [place] [address]   CBG monitoring, ED     Status: Abnormal   Collection Time: 06/11/20 11:32 AM  Result Value Ref Range   Glucose-Capillary 163 (H) 70 - 99 mg/dL    Comment: Glucose reference range applies only to samples taken after fasting for at least 8 hours.  CBG monitoring, ED     Status: Abnormal   Collection Time: 06/11/20  4:54 PM  Result Value Ref Range   Glucose-Capillary 160 (H) 70 - 99 mg/dL    Comment: Glucose reference range applies only to samples taken after fasting for at least 8 hours.  Glucose, capillary  Status: Abnormal   Collection Time: 06/11/20  9:40 PM  Result Value Ref Range   Glucose-Capillary 185 (H) 70 - 99 mg/dL    Comment: Glucose reference range applies only to samples taken after fasting for at least 8 hours.  CBC     Status: Abnormal   Collection Time: 06/12/20  3:21 AM  Result Value Ref Range   WBC 5.4 4.0 - 10.5 K/uL   RBC 3.47 (L) 3.87 - 5.11 MIL/uL   Hemoglobin 11.2 (L) 12.0 - 15.0 g/dL   HCT 34.0 (L) 36.0 - 46.0 %   MCV 98.0 80.0 - 100.0 fL   MCH 32.3 26.0 - 34.0 pg   MCHC 32.9 30.0 - 36.0 g/dL   RDW 13.2 11.5 - 15.5 %   Platelets 291 150 - 400 K/uL   nRBC 0.0 0.0 - 0.2 %    Comment: Performed at [facility], [place] [address], [place], [place] [address]  Basic metabolic  panel     Status: Abnormal   Collection Time: 06/12/20  3:21 AM  Result Value Ref Range   Sodium 139 135 - 145 mmol/L   Potassium 4.1 3.5 - 5.1 mmol/L   Chloride 101 98 - 111 mmol/L   CO2 28 22 - 32 mmol/L   Glucose, Bld 243 (H) 70 - 99 mg/dL    Comment: Glucose reference range applies only to samples taken after fasting for at least 8 hours.   BUN 26 (H) 8 - 23 mg/dL   Creatinine, Ser 1.59 (H) 0.44 - 1.00 mg/dL   Calcium 9.2 8.9 - 10.3 mg/dL   GFR, Estimated 34 (L) >60 mL/min    Comment: (NOTE) Calculated using the CKD-EPI Creatinine Equation (2021)    Anion gap 10 5 - 15    Comment: Performed at [facility] [address] [place], [place] [address]  TSH     Status: Abnormal   Collection Time: 06/12/20  3:21 AM  Result Value Ref Range   TSH 46.431 (H) 0.350 - 4.500 uIU/mL    Comment: Performed by a 3rd Generation assay with a functional sensitivity of <=0.01 uIU/mL. Performed at [facility], [place] [address] [place], [place] [address]   Glucose, capillary     Status: Abnormal   Collection Time: 06/12/20  6:50 AM  Result Value Ref Range   Glucose-Capillary 127 (H) 70 - 99 mg/dL    Comment: Glucose reference range applies only to samples taken after fasting for at least 8 hours.  T4, free     Status: None   Collection Time: 06/12/20  7:34 AM  Result Value Ref Range   Free T4 0.85 0.61 - 1.12 ng/dL    Comment: (NOTE) Biotin ingestion may interfere with free T4 tests. If the results are inconsistent with the TSH level, previous test results, or the clinical presentation, then consider biotin interference. If needed, order repeat testing after stopping biotin. Performed at [facility], [place] [address], [place], [place] [address]   Glucose, capillary     Status: Abnormal   Collection Time: 06/12/20 12:02 PM  Result Value Ref Range   Glucose-Capillary 214 (H) 70 - 99 mg/dL    Comment: Glucose reference range applies only to samples taken after fasting for  at least 8 hours.    Current Facility-Administered Medications  Medication Dose Route Frequency Provider Last Rate Last Admin  . acetaminophen (TYLENOL) tablet 650 mg  650 mg Oral Q6H PRN Madalyn Rob,  MD   650 mg at 06/12/20 1530   Or  . acetaminophen (TYLENOL) suppository 650 mg  650 mg Rectal Q6H PRN Madalyn Rob, MD      . amLODipine (NORVASC) tablet 5 mg  5 mg Oral Daily Madalyn Rob, MD   5 mg at 06/12/20 7793  . apixaban (ELIQUIS) tablet 5 mg  5 mg Oral BID Madalyn Rob, MD   5 mg at 06/12/20 9030  . bisacodyl (DULCOLAX) suppository 10 mg  10 mg Rectal Daily PRN Velna Ochs, MD   10 mg at 06/12/20 1531  . calcium-vitamin D (OSCAL WITH D) 500-200 MG-UNIT per tablet 1 tablet  1 tablet Oral Q breakfast Madalyn Rob, MD   1 tablet at 06/12/20 [phone]  . cholecalciferol (VITAMIN D3) tablet 2,000 Units  2,000 Units Oral Daily Madalyn Rob, MD   2,000 Units at 06/12/20 [phone]  . ezetimibe (ZETIA) tablet 10 mg  10 mg Oral QPM Madalyn Rob, MD      . HYDROcodone-acetaminophen (NORCO/VICODIN) 5-325 MG per tablet 1 tablet  1 tablet Oral Q6H PRN Jose Persia, MD   1 tablet at 06/12/20 1200  . insulin aspart (novoLOG) injection 0-5 Units  0-5 Units Subcutaneous QHS Madalyn Rob, MD      . insulin aspart (novoLOG) injection 0-9 Units  0-9 Units Subcutaneous TID WC Madalyn Rob, MD   3 Units at 06/12/20 1247  . insulin detemir (LEVEMIR) injection 10 Units  10 Units Subcutaneous QHS Madalyn Rob, MD   10 Units at 06/11/20 2252  . [START ON 06/13/2020] levothyroxine (SYNTHROID) tablet 125 mcg  125 mcg Oral Daily Jeralyn Bennett, MD      . pantoprazole (PROTONIX) EC tablet 40 mg  40 mg Oral Daily Madalyn Rob, MD   40 mg at 06/12/20 6226  . polyethylene glycol (MIRALAX / GLYCOLAX) packet 17 g  17 g Oral Daily PRN Madalyn Rob, MD      . pregabalin (LYRICA) capsule 25 mg  25 mg Oral TID Jeralyn Bennett, MD   25 mg at 06/12/20 1041  . senna-docusate (Senokot-S) tablet 1 tablet  1 tablet Oral QHS PRN  Madalyn Rob, MD      . sertraline (ZOLOFT) tablet 100 mg  100 mg Oral Daily Madalyn Rob, MD   100 mg at 06/12/20 3335  . simvastatin (ZOCOR) tablet 40 mg  40 mg Oral QPM Madalyn Rob, MD   40 mg at 06/11/20 1816  . vitamin B-12 (CYANOCOBALAMIN) tablet 1,000 mcg  1,000 mcg Oral Daily Madalyn Rob, MD   1,000 mcg at 06/12/20 4562    Musculoskeletal: Strength & Muscle Tone: within normal limits Gait & Station: normal Patient leans: N/A  Psychiatric Specialty Exam: Physical Exam  Review of Systems  Blood pressure 126/65, pulse 77, temperature 98.4 F (36.9 C), temperature source Oral, resp. rate 18, SpO2 96 %.There is no height or weight on file to calculate BMI.  General Appearance: Fairly Groomed  Eye Contact:  Fair  Speech:  Clear and Coherent and Normal Rate  Volume:  Normal  Mood:  Euthymic  Affect:  Appropriate and Congruent  Thought Process:  Coherent, Goal Directed and Descriptions of Associations: Intact  Orientation:  Full (Time, Place, and Person)  Thought Content:  WDL  Suicidal Thoughts:  No  Homicidal Thoughts:  No  Memory:  Immediate;   Fair Recent;   Fair  Judgement:  Intact  Insight:  Fair  Psychomotor Activity:  Normal  Concentration:  Concentration: Good and Attention Span: Good  Recall:  Smiley Houseman of Knowledge:  Fair  Language:  Fair  Akathisia:  No  Handed:  Right  AIMS (if indicated):     Assets:  Communication Skills Desire for Improvement Financial Resources/Insurance Housing Leisure Time Physical Health Social Support  ADL's:  Intact  Cognition:  WNL  Sleep:        Treatment Plan Summary: Plan Discussed treatment options and modalities with patient to include outpatient behavioral health services. We agreed to increase zoloft as she was currently taking this medication and was comfortable increasing the dose. She also agreed to start behavioral therapy which can be performed via telepsychiatry by meand of her ipad.  -Recommend increasing  zoloft 150mg  po daily to further target depression and anxiety.  -She declined adjunct therapy at this time. -Recommend working closely with SW to place referral to outpatient geriatric providers for medication management and therapy.   Disposition: No evidence of imminent risk to self or others at present.   Patient does not meet criteria for psychiatric inpatient admission. Psychiatry to sign off. Patient does appear to have capacity and does not pose a threat to herself or others therefore she does not meet IVC criteria.  Suella Broad, FNP 06/12/2020

## 2020-06-13 LAB — BASIC METABOLIC PANEL
Anion gap: 9 (ref 5–15)
BUN: 25 mg/dL — ABNORMAL HIGH (ref 8–23)
CO2: 30 mmol/L (ref 22–32)
Calcium: 9.6 mg/dL (ref 8.9–10.3)
Chloride: 101 mmol/L (ref 98–111)
Creatinine, Ser: 1.43 mg/dL — ABNORMAL HIGH (ref 0.44–1.00)
GFR, Estimated: 39 mL/min — ABNORMAL LOW (ref >60)
Glucose, Bld: 232 mg/dL — ABNORMAL HIGH (ref 70–99)
Potassium: 4.1 mmol/L (ref 3.5–5.1)
Sodium: 140 mmol/L (ref 135–145)

## 2020-06-13 LAB — GLUCOSE, CAPILLARY
Glucose-Capillary: 167 mg/dL — ABNORMAL HIGH (ref 70–99)
Glucose-Capillary: 204 mg/dL — ABNORMAL HIGH (ref 70–99)
Glucose-Capillary: 281 mg/dL — ABNORMAL HIGH (ref 70–99)
Glucose-Capillary: 198 mg/dL — ABNORMAL HIGH (ref 70–99)

## 2020-06-13 LAB — CBC
HCT: 34.8 % — ABNORMAL LOW (ref 36.0–46.0)
Hemoglobin: 11 g/dL — ABNORMAL LOW (ref 12.0–15.0)
MCH: 31.5 pg (ref 26.0–34.0)
MCHC: 31.6 g/dL (ref 30.0–36.0)
MCV: 99.7 fL (ref 80.0–100.0)
Platelets: 293 10*3/uL (ref 150–400)
RBC: 3.49 MIL/uL — ABNORMAL LOW (ref 3.87–5.11)
RDW: 13.2 % (ref 11.5–15.5)
WBC: 5.5 10*3/uL (ref 4.0–10.5)
nRBC: 0 % (ref 0.0–0.2)

## 2020-06-13 LAB — T3, FREE: T3, Free: 1.4 pg/mL — ABNORMAL LOW (ref 2.0–4.4)

## 2020-06-13 MED ORDER — ATORVASTATIN CALCIUM 10 MG PO TABS
20.0000 mg | ORAL_TABLET | Freq: Every day | ORAL | Status: DC
  Administered 2020-06-13 – 2020-06-16 (×4): 20 mg via ORAL
  Filled 2020-06-13 (×4): qty 2

## 2020-06-13 MED ORDER — CYCLOBENZAPRINE HCL 5 MG PO TABS
5.0000 mg | ORAL_TABLET | Freq: Two times a day (BID) | ORAL | Status: DC
  Administered 2020-06-13 – 2020-06-16 (×7): 5 mg via ORAL
  Filled 2020-06-13 (×7): qty 1

## 2020-06-13 MED ORDER — CYCLOBENZAPRINE HCL 5 MG PO TABS
5.0000 mg | ORAL_TABLET | Freq: Once | ORAL | Status: AC
  Administered 2020-06-13: 5 mg via ORAL
  Filled 2020-06-13: qty 1

## 2020-06-13 MED ORDER — ACETAMINOPHEN 325 MG PO TABS
650.0000 mg | ORAL_TABLET | Freq: Four times a day (QID) | ORAL | Status: DC
  Administered 2020-06-13 – 2020-06-16 (×15): 650 mg via ORAL
  Filled 2020-06-13 (×15): qty 2

## 2020-06-13 MED ORDER — CYCLOBENZAPRINE HCL 5 MG PO TABS: 5.0000 mg | ORAL_TABLET | Freq: Two times a day (BID) | ORAL | Status: DC

## 2020-06-13 NOTE — Plan of Care (Signed)
  Problem: Pain Managment: Goal: General experience of comfort will improve Outcome: Progressing   

## 2020-06-13 NOTE — Plan of Care (Signed)

## 2020-06-13 NOTE — TOC Progression Note (Signed)
Transition of Care [facility]) - Progression Note    Patient Details  Name: Toni Hill MRN: [id number] Date of Birth: 09-18-1945  [facility] of [facility]) CM/SW Contact  Sharin Mons, RN Phone Number: 06/13/2020, 11:07 AM  Clinical Narrative:    Presents with chronic right leg pain, hx of HTN, HLD, T2DM, CKD stage III, diabetic polyneuropathy, and right tib-fib fracture s/p ORIF (2010).  Per PT eval/recommendation: SNF;Supervision for mobility/OOB. Pt agreeable to SNF/Rehab. Continues to verbalize. Limited family support, " If I go home I would died."' The only thing my husband can do for me is make a phone call for help if i fall."   Pt with one noted SNF bed offer, [facility]. NCM shared with pt and called admission liaison for pt to ask questions. After speaking with liaison pt accepted bed offer. However, SNF bed will not be available until Mon or Tuesday of next week.  Pt will need updated COVID closer to d/c day.   TOC team will continue to monitor and follow ....  Expected Discharge Plan: [facility] SNF) Barriers to Discharge: No SNF bed  Expected Discharge Plan and Services Expected Discharge Plan: [facility] SNF)   Discharge Planning Services: CM Consult   Living arrangements for the past 2 months: Single Family Home                                       Social Determinants of Health (SDOH) Interventions    Readmission Risk Interventions No flowsheet data found.

## 2020-06-13 NOTE — Progress Notes (Signed)
Subjective: HD#3  No acute overnight events. Toni Hill evaluated at bedside this AM. She continues to endorse right foot pain that has not changed significantly since yesterday. She endorses aching worse around her ankle and has some burning over her knee cap and behind her knee. She endorses subjective numbness but is tender to palpation. She is tearful and asking repeatedly to help her and she is informed about the medication changes.   Of note: Patient provided with reading material about Functional Neurological Symptom Disorder. Objective:  Vital signs in last 24 hours: Vitals:   06/12/20 1536 06/12/20 1938 06/13/20 0534 06/13/20 0830  BP: 126/65 (!) 116/54 (!) 129/103 (!) 125/59  Pulse: 77 79 69 74  Resp: 18 17 18 18   Temp: 98.4 F (36.9 C) 98.6 F (37 C) 97.7 F (36.5 C) 98 F (36.7 C)  TempSrc: Oral Oral Oral Oral  SpO2: 96% 98% 99% 98%   CBC Latest Ref Rng & Units 06/13/2020 06/12/2020 06/10/2020  WBC 4.0 - 10.5 K/uL 5.5 5.4 6.5  Hemoglobin 12.0 - 15.0 g/dL 11.0(L) 11.2(L) 13.0  Hematocrit 36.0 - 46.0 % 34.8(L) 34.0(L) 39.9  Platelets 150 - 400 K/uL 293 291 351   BMP Latest Ref Rng & Units 06/13/2020 06/12/2020 06/10/2020  Glucose 70 - 99 mg/dL 232(H) 243(H) 205(H)  BUN 8 - 23 mg/dL 25(H) 26(H) 23  Creatinine 0.44 - 1.00 mg/dL 1.43(H) 1.59(H) 1.58(H)  BUN/Creat Ratio 12 - 28 - - -  Sodium 135 - 145 mmol/L 140 139 140  Potassium 3.5 - 5.1 mmol/L 4.1 4.1 4.4  Chloride 98 - 111 mmol/L 101 101 99  CO2 22 - 32 mmol/L 30 28 28   Calcium 8.9 - 10.3 mg/dL 9.6 9.2 9.9   Physical exam: General: Well developed, well nourished elderly female lying comfortably in bed, NAD CV: RRR, S1, S2 normal, No m/r/g Pulmonary: CTAB, normal effort Abdomen: Soft, non-tender, non distended Extremities: R. Foot digital contractures. Pain to palpation, passive movement R leg.  Psych: Tearful mood and affect    Assessment/Plan:  Toni Hill is a 74 yo F with a PMHx of HTN, HLD, Type II DM,  CKD IIIb, hypothyroidism, GERD, RLE DVT on anticoagulation, left total knee replacement, right tib-fib fracture s/p ORIF in 2010, followed by hardware removal on 03/05/2020 due to pain, anxiety, who presented with persistent right lower extremity weakness and pain.    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 Case manager informed that Toni Hill declined a bed and they will be offering her more options.  - Appreciate the CW assistance.  - C/W Lyrica 25 mg TID - Flexeril 5 mg BID   T2DM w/polyneuropathy Blood glucose 167 this morning. HbA1c is 7.9, was 9, six months ago. On levemir 15 units qhs and SSI at home. Pain in her leg seems like neuropathic pain with burning sensation, unlikely myopathy.  -C/w levemir 10 units qhs - SSI -Lyrica 25 mg TID -C/w Lipitor 20 mg/day  Hx of Vitamin B12 deficiency Current Vitamin B12 level 540 .   -C/W B12 1031mcg daily   Prior to Admission Living Arrangement:Home  Anticipated Discharge Location:Home vs SNF Barriers to Discharge: Pending disposition Dispo: Anticipated discharge in approximately 1-2 day(s).   [name], MD 06/13/2020, 9:21 AM Pager: [phone] After 5pm on weekdays and 1pm on weekends: On Call pager [phone]

## 2020-06-14 DIAGNOSIS — M79661 Pain in right lower leg: Secondary | ICD-10-CM

## 2020-06-14 LAB — GLUCOSE, CAPILLARY
Glucose-Capillary: 225 mg/dL — ABNORMAL HIGH (ref 70–99)
Glucose-Capillary: 169 mg/dL — ABNORMAL HIGH (ref 70–99)
Glucose-Capillary: 190 mg/dL — ABNORMAL HIGH (ref 70–99)
Glucose-Capillary: 181 mg/dL — ABNORMAL HIGH (ref 70–99)

## 2020-06-14 MED ORDER — PREGABALIN 25 MG PO CAPS
50.0000 mg | ORAL_CAPSULE | Freq: Every day | ORAL | Status: DC
  Administered 2020-06-14 – 2020-06-16 (×3): 50 mg via ORAL
  Filled 2020-06-14 (×3): qty 2

## 2020-06-14 MED ORDER — SENNA 8.6 MG PO TABS: 2.0000 | ORAL_TABLET | Freq: Every day | ORAL | Status: DC

## 2020-06-14 MED ORDER — SENNOSIDES-DOCUSATE SODIUM 8.6-50 MG PO TABS
1.0000 | ORAL_TABLET | Freq: Every day | ORAL | Status: DC
  Administered 2020-06-14 – 2020-06-16 (×3): 1 via ORAL
  Filled 2020-06-14 (×3): qty 1

## 2020-06-14 MED ORDER — HYDROCODONE-ACETAMINOPHEN 5-325 MG PO TABS
0.5000 | ORAL_TABLET | Freq: Four times a day (QID) | ORAL | Status: DC | PRN
  Administered 2020-06-14 – 2020-06-16 (×6): 0.5 via ORAL
  Filled 2020-06-14 (×6): qty 1

## 2020-06-14 MED ORDER — PREGABALIN 25 MG PO CAPS
25.0000 mg | ORAL_CAPSULE | Freq: Two times a day (BID) | ORAL | Status: DC
  Administered 2020-06-14 – 2020-06-16 (×5): 25 mg via ORAL
  Filled 2020-06-14 (×5): qty 1

## 2020-06-14 MED ORDER — INSULIN ASPART 100 UNIT/ML ~~LOC~~ SOLN
0.0000 [IU] | Freq: Three times a day (TID) | SUBCUTANEOUS | Status: DC
  Administered 2020-06-14 (×2): 3 [IU] via SUBCUTANEOUS
  Administered 2020-06-14: 5 [IU] via SUBCUTANEOUS
  Administered 2020-06-15: 2 [IU] via SUBCUTANEOUS
  Administered 2020-06-15: 5 [IU] via SUBCUTANEOUS
  Administered 2020-06-15 – 2020-06-16 (×2): 3 [IU] via SUBCUTANEOUS
  Administered 2020-06-16: 5 [IU] via SUBCUTANEOUS
  Administered 2020-06-16: 2 [IU] via SUBCUTANEOUS

## 2020-06-14 NOTE — Plan of Care (Signed)
  Problem: Education: Goal: Knowledge of General Education information will improve Description: Including pain rating scale, medication(s)/side effects and non-pharmacologic comfort measures Outcome: Progressing   Problem: Health Behavior/Discharge Planning: Goal: Ability to manage health-related needs will improve Outcome: Progressing   Problem: Clinical Measurements: Goal: Ability to maintain clinical measurements within normal limits will improve Outcome: Progressing   Problem: Activity: Goal: Risk for activity intolerance will decrease Outcome: Progressing   Problem: Coping: Goal: Level of anxiety will decrease Outcome: Progressing   Problem: Pain Managment: Goal: General experience of comfort will improve Outcome: Progressing   Problem: Safety: Goal: Ability to remain free from injury will improve Outcome: Progressing   

## 2020-06-14 NOTE — Plan of Care (Signed)

## 2020-06-14 NOTE — Progress Notes (Signed)
   Subjective: HD#4  No acute overnight events.  Toni Hill evaluated at bedside this AM. She endorses continued right leg pain that is unchanged. Flexeril helped a little. Notes constipation.  Objective:  Vital signs in last 24 hours: Vitals:   06/13/20 1514 06/13/20 1950 06/14/20 0338 06/14/20 0744  BP: 110/65 (!) 110/52 (!) 124/51 130/65  Pulse: 75 76 69 68  Resp: 18 15 17 15   Temp: 98.4 F (36.9 C) 98.4 F (36.9 C) 97.8 F (36.6 C) 97.7 F (36.5 C)  TempSrc: Oral Oral Oral Oral  SpO2: 96% 98% 99% 97%   CBC Latest Ref Rng & Units 06/13/2020 06/12/2020 06/10/2020  WBC 4.0 - 10.5 K/uL 5.5 5.4 6.5  Hemoglobin 12.0 - 15.0 g/dL 11.0(L) 11.2(L) 13.0  Hematocrit 36.0 - 46.0 % 34.8(L) 34.0(L) 39.9  Platelets 150 - 400 K/uL 293 291 351   BMP Latest Ref Rng & Units 06/13/2020 06/12/2020 06/10/2020  Glucose 70 - 99 mg/dL 232(H) 243(H) 205(H)  BUN 8 - 23 mg/dL 25(H) 26(H) 23  Creatinine 0.44 - 1.00 mg/dL 1.43(H) 1.59(H) 1.58(H)  BUN/Creat Ratio 12 - 28 - - -  Sodium 135 - 145 mmol/L 140 139 140  Potassium 3.5 - 5.1 mmol/L 4.1 4.1 4.4  Chloride 98 - 111 mmol/L 101 101 99  CO2 22 - 32 mmol/L 30 28 28   Calcium 8.9 - 10.3 mg/dL 9.6 9.2 9.9   Physical exam: General: Well developed, well nourished elderly female lying comfortably in bed, NAD CV: RRR, S1, S2 normal, No m/r/g Pulmonary: CTAB, normal effort Abdomen: Soft, non-tender, non distended Extremities: R. Foot digital contractures. Decreased pain to palpation, passive movement R leg, Hoover test +.  Psych: Normal mood and affect    Assessment/Plan:  Toni Hill is a 74 yo F with a PMHx of HTN, HLD, Type II DM, CKD IIIb, hypothyroidism, GERD, RLE DVT on anticoagulation, left total knee replacement, right tib-fib fracture s/p ORIF in 2010, followed by hardware removal on 03/05/2020 due to pain, anxiety, who presented with persistent right lower extremity weakness and pain.    Principal Problem:   Functional neurological symptom  disorder with weakness or paralysis Active Problems:   paralysis of right leg   Right leg weakness  #Chronic Right Leg Pain and Weakness  #Functional Neurological Symptom Disorder Flexeril helped with the pain yesterday and plan to continue. On examination, significantly decreased pain on palpation today, + Hoover test. Case manager found a bed in SNF which will be available Mon/Tues.   - Greatly Appreciate the CW assistance.  - C/W Lyrica 25 mg Bid & 50 mg QHS - Flexeril 5 mg BID   T2DM w/polyneuropathy Blood glucose 225 this morning. HbA1c is 7.9, was 9, six months ago. On levemir 15 units qhs and SSI at home. Pain in her leg seems like neuropathic pain with burning sensation, unlikely myopathy.  -C/w levemir 10 units qhs - Increased SSI -Lyrica 25 mg TID -C/w Lipitor 20 mg/day  Hx of Vitamin B12 deficiency Current Vitamin B12 level 540 .   -C/W B12 1063mcg daily   Prior to Admission Living Arrangement:Home Anticipated Discharge Location:SNF Barriers to Discharge: Pending disposition Dispo: Anticipated discharge in approximately 1-2 day(s).   [name], MD 06/14/2020, 11:32 AM Pager: [phone] After 5pm on weekdays and 1pm on weekends: On Call pager [phone]

## 2020-06-15 LAB — GLUCOSE, CAPILLARY
Glucose-Capillary: 128 mg/dL — ABNORMAL HIGH (ref 70–99)
Glucose-Capillary: 138 mg/dL — ABNORMAL HIGH (ref 70–99)
Glucose-Capillary: 214 mg/dL — ABNORMAL HIGH (ref 70–99)
Glucose-Capillary: 200 mg/dL — ABNORMAL HIGH (ref 70–99)
Glucose-Capillary: 204 mg/dL — ABNORMAL HIGH (ref 70–99)

## 2020-06-15 LAB — BASIC METABOLIC PANEL
Anion gap: 8 (ref 5–15)
BUN: 37 mg/dL — ABNORMAL HIGH (ref 8–23)
CO2: 27 mmol/L (ref 22–32)
Calcium: 9.3 mg/dL (ref 8.9–10.3)
Chloride: 103 mmol/L (ref 98–111)
Creatinine, Ser: 1.4 mg/dL — ABNORMAL HIGH (ref 0.44–1.00)
GFR, Estimated: 40 mL/min — ABNORMAL LOW (ref >60)
Glucose, Bld: 183 mg/dL — ABNORMAL HIGH (ref 70–99)
Potassium: 4.1 mmol/L (ref 3.5–5.1)
Sodium: 138 mmol/L (ref 135–145)

## 2020-06-15 LAB — CBC
HCT: 33.2 % — ABNORMAL LOW (ref 36.0–46.0)
Hemoglobin: 10.9 g/dL — ABNORMAL LOW (ref 12.0–15.0)
MCH: 32.2 pg (ref 26.0–34.0)
MCHC: 32.8 g/dL (ref 30.0–36.0)
MCV: 98.2 fL (ref 80.0–100.0)
Platelets: 296 10*3/uL (ref 150–400)
RBC: 3.38 MIL/uL — ABNORMAL LOW (ref 3.87–5.11)
RDW: 13.4 % (ref 11.5–15.5)
WBC: 5.5 10*3/uL (ref 4.0–10.5)
nRBC: 0 % (ref 0.0–0.2)

## 2020-06-15 NOTE — Progress Notes (Signed)
   Subjective:   Toni Hill states that she continues to have pain in her right leg that feels like a sharp stabbing and only lasts a few seconds but is repetitive. She denies any acute complaints.  Objective:  Vital signs in last 24 hours: Vitals:   06/14/20 0744 06/14/20 1215 06/14/20 1341 06/14/20 2028  BP: 130/65 130/65 (!) 114/47 (!) 124/96  Pulse: 68 68 74 76  Resp: 15 15 17 16   Temp: 97.7 F (36.5 C) (!) 97.5 F (36.4 C) 97.7 F (36.5 C) 98.3 F (36.8 C)  TempSrc: Oral Oral Oral Oral  SpO2: 97%  99% 96%  Weight:  77.1 kg    Height:  5\' 9"  (1.753 m)     Physical exam: General: Well developed, well nourished elderly female lying comfortably in bed, NAD Pulmonary: Normal effort Psych: Normal mood and affect  Assessment/Plan:  [name] is a 74 yo F with a PMHx of HTN, HLD, Type II DM, CKD IIIb, hypothyroidism, GERD, RLE DVT on anticoagulation, left total knee replacement, right tib-fib fracture s/p ORIF in 2010, followed by hardware removal on 03/05/2020 due to pain, anxiety, who presented with persistent right lower extremity weakness and pain.    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 No medication changes planned for today.  - Greatly Appreciate the CW assistance.  - C/W Lyrica 25 mg Bid & 50 mg QHS - Flexeril 5 mg BID  T2DM w/polyneuropathy - C/w levemir 10 units qhs and SSI - Lyrica 25 mg TID - C/w Lipitor 20 mg/day  Hx of Vitamin B12 deficiency Current Vitamin B12 level 540 .   -C/W B12 1088mcg daily  Prior to Admission Living Arrangement:Home Anticipated Discharge Location:SNF Barriers to Discharge: Pending disposition Dispo: Anticipated discharge in approximately 1-2  day(s).   Dr. Jose Persia Internal Medicine PGY-2  Pager: [phone] After 5pm on weekdays and 1pm on weekends: On Call pager [phone]  06/15/2020, 7:04 AM

## 2020-06-16 LAB — GLUCOSE, CAPILLARY
Glucose-Capillary: 133 mg/dL — ABNORMAL HIGH (ref 70–99)
Glucose-Capillary: 163 mg/dL — ABNORMAL HIGH (ref 70–99)
Glucose-Capillary: 227 mg/dL — ABNORMAL HIGH (ref 70–99)
Glucose-Capillary: 236 mg/dL — ABNORMAL HIGH (ref 70–99)

## 2020-06-16 LAB — SARS CORONAVIRUS 2 BY RT PCR (HOSPITAL ORDER, PERFORMED IN ~~LOC~~ HOSPITAL LAB): SARS Coronavirus 2: NEGATIVE

## 2020-06-16 MED ORDER — LEVOTHYROXINE SODIUM 125 MCG PO TABS: 125.0000 ug | ORAL_TABLET | Freq: Every day | ORAL | 0 refills | Status: DC

## 2020-06-16 MED ORDER — PREGABALIN 50 MG PO CAPS: 50.0000 mg | ORAL_CAPSULE | Freq: Every day | ORAL | 0 refills | Status: DC

## 2020-06-16 MED ORDER — ATORVASTATIN CALCIUM 20 MG PO TABS: 20.0000 mg | ORAL_TABLET | Freq: Every day | ORAL | 0 refills | Status: DC

## 2020-06-16 MED ORDER — PREGABALIN 25 MG PO CAPS: 25.0000 mg | ORAL_CAPSULE | Freq: Two times a day (BID) | ORAL | 0 refills | Status: DC

## 2020-06-16 MED ORDER — SERTRALINE HCL 50 MG PO TABS: 150.0000 mg | ORAL_TABLET | Freq: Every day | ORAL | 0 refills | Status: DC

## 2020-06-16 MED ORDER — CYCLOBENZAPRINE HCL 5 MG PO TABS: 5.0000 mg | ORAL_TABLET | Freq: Two times a day (BID) | ORAL | 0 refills | Status: DC

## 2020-06-16 NOTE — TOC Transition Note (Addendum)
Transition of Care [facility]) - CM/SW Discharge Note   Patient Details  Name: LACONDA BASICH MRN: [id number] Date of Birth: 1945-11-02  Transition of Care [facility]) CM/SW Contact:  Sharin Mons, RN Phone Number: 06/16/2020, 12:43 PM   Clinical Narrative:    Patient will DC to: [facility] Anticipated DC date: 06/16/2020 Family notified: Lenna Sciara ( daughter) Transport by: Corey Harold  Per MD patient ready for DC today. RN, patient, patient's family, and facility notified of DC. Discharge Summary and FL2 will be sent to facility once available. RN to call report prior to discharge [phone]. Rm# [id number]. DC packet on chart. Ambulance transport will be requested for patient once updated COVID noted and d/c summary in.    Mikey Kirschner (Daughter) Ranisha Allaire (Son)     [phone]      TOC TEAM WILL CONTINUE TO MONITOR.....  06/16/2020 @ 1453 Discharge Summary and FL2 sent to facility.  Ambulance transport requested, nurse, pt and daughter made aware.  Final next level of care: [place] Barriers to Discharge: No Barriers Identified [facility])   Patient Goals and CMS Choice Patient states their goals for this hospitalization and ongoing recovery are:: to get better CMS Medicare.gov Compare Post Acute Care list provided to:: Patient    Discharge Placement                       Discharge Plan and Services   Discharge Planning Services: CM Consult                                 Social Determinants of Health (SDOH) Interventions     Readmission Risk Interventions No flowsheet data found.

## 2020-06-16 NOTE — Progress Notes (Addendum)
   Subjective: HD# 6 No acute overnight events. Toni Hill evaluated at bedside this AM. She states her pain is improving but she is not able to move her leg at all. Denies nausea, vomiting, SOB, cough or chest pain. Discussion about her acceptance to a short term facility and how physical therapy will possibly help her with movement of her leg, she shows good understanding of the plan and appreciate the help received at the hospital.    Objective:  Vital signs in last 24 hours: Vitals:   06/15/20 0812 06/15/20 1442 06/15/20 2056 06/16/20 0858  BP: (!) 117/57 (!) 122/59 (!) 128/51 (!) 120/59  Pulse: 67 74 75 70  Resp: 16 18 15 20   Temp: 98 F (36.7 C) 98 F (36.7 C) 97.8 F (36.6 C) 97.9 F (36.6 C)  TempSrc:   Oral Oral  SpO2: 97% 99% 97% 99%  Weight:      Height:       Physical exam: General: Well developed, well nourished elderly female lying comfortably in bed, NAD CV: RRR, S1, S2 normal, No m/r/g Pulmonary: Normal effort Abdomen: Soft, non-tender, non-distended, BS + Extremities: R. Foot digital contractures. Decreased pain to palpation, passive movement R leg. Psych: Pleasent mood and affect  Assessment/Plan:  Toni Hill is a 74 yo F with a PMHx of HTN, HLD, Type II DM, CKD IIIb, hypothyroidism, GERD, RLE DVT on anticoagulation, left total knee replacement, right tib-fib fracture s/p ORIF in 2010, followed by hardware removal on 03/05/2020 due to pain, anxiety, admitted for persistent right lower extremity weakness and pain.    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 - Greatly Appreciate the CW assistance.    #Chronic Right Leg pain: Likely  multifactorial from fibromyalgia and DM neuropathy.  - Lyrica 25 mg BID and 50 mg QHS - Flexeril  BID prn   T2DM w/polyneuropathy - C/w levemir 10 units qhs and SSI - Lyrica 25 mg TID - C/w Lipitor 20 mg/day  Hx of Vitamin B12 deficiency Current Vitamin B12 level 540 .   -C/W B12 1027mcg daily  Prior to Admission Living Arrangement:Home Anticipated Discharge Location:SNF Barriers to Discharge: None Dispo: Anticipated discharge in approximately 0 day(s).   [name], MD Pager: [phone] After 5pm on weekdays and 1pm on weekends: On Call pager [phone]  06/16/2020, 1:42 PM

## 2020-06-16 NOTE — Plan of Care (Signed)

## 2020-06-16 NOTE — Progress Notes (Signed)
PTAR at bedside 

## 2020-06-16 NOTE — Care Management Important Message (Signed)
Important Message  Patient Details  Name: Toni Hill MRN: [id number] Date of Birth: 1945/12/10   Medicare Important Message Given:  Yes - Important Message mailed due to current National Emergency   Verbal consent obtained due to current National Emergency  Relationship to patient: Self Contact Name: Arlyss Weathersby Call Date: 06/16/20  Time: [place] Phone: [phone] Outcome: Spoke with contact Important Message mailed to: Patient address on file    Delorse Lek 06/16/2020, 2:57 PM

## 2020-06-16 NOTE — Plan of Care (Signed)

## 2020-06-16 NOTE — Discharge Instructions (Signed)

## 2020-06-16 NOTE — Progress Notes (Signed)
Physical Therapy Treatment Patient Details Name: Toni Hill MRN: [id number] DOB: 1945-11-15 Today's Date: 06/16/2020    History of Present Illness Pt is a 74 y.o. female admitted 06/10/20 with c/o chronic RLE pain and paralysis for the past 3 months (which started around the time her distal tib-fib fx hardware from 2010 was removed by Dr. Erlinda Hong on 03/05/20; pt started on Eliquis 05/22/20 due to chronic-appearing blood clot in RLE). Lumbar MRI 12/8 showed L5-S1 stenosis, L4-5 foraminal narrowing. Head MRI negative for acute abnormality. PMH includes chronic back pain, CKD, HTN, OA, DM2, polyneuropathy, Bipolar 1 disorder.    PT Comments    Pt was seen for attempt to do a scooting transfer but did not have any hip flexion during her bed ex, had limited ROM from neutral and mm reaction to be unable to move. Noted same for knee flexion and DF, a strong mm reaction that was difficult to overcome.  Pt became tearful at end of session, talked with her about MD's working with her to help with RLE.  Will recommend hoyer or maximove to get up as pt is likely to need to be positioned more extended unless she can relax the R hip and allow flexion.   Follow Up Recommendations  SNF     Equipment Recommendations  Hospital bed;Other (comment) (hoyer)    Recommendations for Other Services       Precautions / Restrictions Precautions Precautions: Fall;Other (comment) Precaution Comments: RLE contracture, mm tightness Restrictions Weight Bearing Restrictions: No    Mobility  Bed Mobility Overal bed mobility: Needs Assistance Bed Mobility: Rolling Rolling: Mod assist     Sit to supine: Total assist   General bed mobility comments: pt was unable to flex hip in bed but with HOB elevated has flexion at R hip  Transfers                 General transfer comment: unable to get to sitting posture to attempt a transfer  Ambulation/Gait                 Stairs              Wheelchair Mobility    Modified Rankin (Stroke Patients Only)       Balance     Sitting balance-Leahy Scale: Zero                                      Cognition Arousal/Alertness: Awake/alert Behavior During Therapy: Anxious Overall Cognitive Status: Impaired/Different from baseline Area of Impairment: Problem solving;Awareness;Following commands                   Current Attention Level: Selective Memory: Decreased recall of precautions;Decreased short-term memory Following Commands: Follows one step commands with increased time;Follows one step commands inconsistently   Awareness: Intellectual Problem Solving: Decreased initiation;Requires verbal cues;Requires tactile cues General Comments: pt is noted to have mm resistance for all movement on RLE, able to move toward DF and knee flexion but then mm tightness increases, hip is fully extended with any attempt to flex      Exercises General Exercises - Lower Extremity Ankle Circles/Pumps: PROM Heel Slides: PROM Hip ABduction/ADduction: PROM Straight Leg Raises: PROM Hip Flexion/Marching: PROM    General Comments General comments (skin integrity, edema, etc.): pt was in bed and attempted to move RLE to see how she could be positioned in chair, but  did not allow hip to flex due to mm resistance.      Pertinent Vitals/Pain Pain Assessment: Faces Faces Pain Scale: Hurts even more Pain Location: R side from ribs down to toes (especially pinky toe today) Pain Descriptors / Indicators: Sharp;Shooting;Guarding;Grimacing Pain Intervention(s): Limited activity within patient's tolerance;Monitored during session;Premedicated before session;Repositioned    Home Living                      Prior Function            PT Goals (current goals can now be found in the care plan section) Acute Rehab PT Goals Patient Stated Goal: SNF Progress towards PT goals: Not progressing toward goals -  comment    Frequency    Min 2X/week      PT Plan Current plan remains appropriate    Co-evaluation              AM-PAC PT "6 Clicks" Mobility   Outcome Measure  Help needed turning from your back to your side while in a flat bed without using bedrails?: Total Help needed moving from lying on your back to sitting on the side of a flat bed without using bedrails?: Total Help needed moving to and from a bed to a chair (including a wheelchair)?: Total Help needed standing up from a chair using your arms (e.g., wheelchair or bedside chair)?: Total Help needed to walk in hospital room?: Total Help needed climbing 3-5 steps with a railing? : Total 6 Click Score: 6    End of Session   Activity Tolerance: Patient limited by pain;Treatment limited secondary to medical complications (Comment) (pt was unable to move RLE due to mm resistance and pain complaints) Patient left: in bed;with call bell/phone within reach;with bed alarm set   PT Visit Diagnosis: Other abnormalities of gait and mobility (R26.89);Pain;Muscle weakness (generalized) (M62.81) Pain - Right/Left: Right Pain - part of body: Hip;Leg;Knee;Ankle and joints of foot     Time: 1225-1255 PT Time Calculation (min) (ACUTE ONLY): 30 min  Charges:  $Therapeutic Exercise: 8-22 mins $Therapeutic Activity: 8-22 mins                    Ramond Dial 06/16/2020, 1:56 PM  Mee Hives, PT MS Acute Rehab Dept. Number: [place] and [place]

## 2020-06-16 NOTE — Plan of Care (Signed)

## 2020-06-16 NOTE — Discharge Summary (Addendum)
Name: Toni Hill MRN: [id number] DOB: 26-Sep-1945 74 y.o. PCP: Maudie Mercury, MD  Date of Admission: 06/10/2020  2:39 PM Date of Discharge:  06/16/20 Attending Physician: Velna Ochs, MD  Discharge Diagnosis: 1. Functional Neurological Disorder: 2. Chronic Right leg Pain 3. Diabetes Mellitus 4. Hx of Vitamin B12 deficiency 5. Hypertension 6. Hyperlipidemia 7. Hypothyroidism 8. Depression   Discharge Medications: Allergies as of 06/16/2020       Reactions   Bactrim [sulfamethoxazole-trimethoprim]    Patient experienced nephrotoxicity in the setting of CKD. Strongly recommend against using this antibiotic in the future.    Cephalexin Itching   Gabapentin    Kidney issue   Morphine And Related Nausea And Vomiting   Vioxx [rofecoxib]    GI bleeding   Penicillins Hives, Itching   Has patient had a PCN reaction causing immediate rash, facial/tongue/throat swelling, SOB or lightheadedness with hypotension: No Has patient had a PCN reaction causing severe rash involving mucus membranes or skin necrosis: No Has patient had a PCN reaction that required hospitalization: no Has patient had a PCN reaction occurring within the last 10 years: No If all of the above answers are "NO", then may proceed with Cephalosporin use.        Medication List     STOP taking these medications    gabapentin 300 MG capsule Commonly known as: NEURONTIN   hydrocortisone 2.5 % cream   Lidocaine 4 % Ptch Commonly known as: HM Lidocaine Patch   methocarbamol 500 MG tablet Commonly known as: ROBAXIN   simvastatin 40 MG tablet Commonly known as: ZOCOR       TAKE these medications    acetaminophen 500 MG tablet Commonly known as: TYLENOL Take 1,000 mg by mouth in the morning and at bedtime.   amLODipine 5 MG tablet Commonly known as: NORVASC Take 5 mg by mouth daily.   Apixaban Starter Pack ($RemoveBefor'10mg'[id number]$  and $Re'5mg'bwO$ ) Commonly known as: ELIQUIS STARTER PACK Take 5 mg by mouth in  the morning and at bedtime.   atorvastatin 20 MG tablet Commonly known as: LIPITOR Take 1 tablet (20 mg total) by mouth daily. Start taking on: June 17, 2020   B-D ULTRAFINE III SHORT PEN 31G X 8 MM Misc Generic drug: Insulin Pen Needle 1 each by Does not apply route daily. Diagnosis code: E11.9. Used to inject insulin daily.   Calcium 600/Vitamin D3 600-800 MG-UNIT Tabs Generic drug: Calcium Carb-Cholecalciferol Take 1 tablet by mouth daily.   cyclobenzaprine 5 MG tablet Commonly known as: FLEXERIL Take 1 tablet (5 mg total) by mouth 2 (two) times daily.   EQL Vitamin D3 50 MCG (2000 UT) Caps Generic drug: Cholecalciferol Take 2,000 Units by mouth daily.   ezetimibe 10 MG tablet Commonly known as: ZETIA TAKE 1 TABLET BY MOUTH EVERY DAY IN THE EVENING What changed:  how much to take how to take this when to take this   insulin detemir 100 unit/ml Soln Commonly known as: LEVEMIR Inject 0.15 mLs (15 Units total) into the skin daily. 15 units nightly, aim to keep CBG between 140-180. What changed: additional instructions   levothyroxine 125 MCG tablet Commonly known as: SYNTHROID Take 1 tablet (125 mcg total) by mouth daily. Start taking on: June 17, 2020 What changed:  medication strength how much to take   NovoLOG FlexPen 100 UNIT/ML FlexPen Generic drug: insulin aspart Inject 2-6 Units into the skin 3 (three) times daily with meals. Sliding scale   ondansetron 4 MG tablet Commonly known  as: ZOFRAN Take 1 tablet (4 mg total) by mouth 2 (two) times daily.   OneTouch Delica Lancets 11H Misc Check blood sugar 3 times a day   Psychologist, occupational w/Device Kit Check blood sugar 3 times a day   OneTouch Verio test strip Generic drug: glucose blood Check blood sugar 3 times a day   pantoprazole 40 MG tablet Commonly known as: PROTONIX TAKE 1 TABLET BY MOUTH EVERY DAY   polyethylene glycol 17 g packet Commonly known as: MiraLax Take 17 g by mouth  daily.   pregabalin 25 MG capsule Commonly known as: LYRICA Take 1 capsule (25 mg total) by mouth 2 (two) times daily.   pregabalin 50 MG capsule Commonly known as: LYRICA Take 1 capsule (50 mg total) by mouth at bedtime.   senna-docusate 8.6-50 MG tablet Commonly known as: Senokot-S Take 1 tablet by mouth daily. What changed:  how much to take when to take this   sertraline 50 MG tablet Commonly known as: ZOLOFT Take 3 tablets (150 mg total) by mouth daily. Start taking on: June 17, 2020 What changed:  medication strength how much to take   vitamin B-12 500 MCG tablet Commonly known as: CYANOCOBALAMIN Take 1,000 mcg by mouth in the morning and at bedtime.        Disposition and follow-up:   Toni Hill was discharged from [facility] in Stable condition.  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 Please work with physical therapy, outpatient cpounseling, no further work up at this time. B. Chronic Right Leg pain: Continue Lyrica 25 mg BID and 50 mg at night. Also continue Flexiril 5 mg BID.  C. T2DM w/polyneuropathy: Gabapentin was changed to lyrica.   D.Hx of Vitamin B12 deficiency:Current Vitamin B12 level 540 . Continue on B12 1013mcg daily E.HTN and HLD: Continue home medication norvasc 5mg  daily and  Lipitor 20 mg/day F.Hypothyroidism: Synthroid was increased back to  125 mcg. Will need repeat TSH in 4-6 weeks.  G. Depression: Zoloft increased to 150 mg per psych  2.  Labs / imaging needed at time of follow-up: TSH  3.  Pending labs/ test needing follow-up: None  Follow-up Appointments:  Contact information for after-discharge care     Destination     [facility] Preferred SNF .   Service: Skilled Nursing Contact information: [address] [place] [facility] Course by problem  list: 1. Functional Neurological Disorder: Presented with right leg paralysis from last 2-3 months. Extensive work up with lumbar spine MRI, pelvic CT, CT head, and MRI brain were all negative for findings to explain her deficit. Case was discussed with neurology on the phone who did not feel this was a neurological problem. 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 She was educated on her diagnosis which she was receptive to. Fortunately this should be reversible with on ongoing education, PT, and counseling. Psych was consulted this admission who recommended increasing her Zoloft to 150 mg and referred for outpatient counseling. She was discharged to SNF.[facility]  2. Chronic Right Leg pain: Likely  multifactorial from fibromyalgia and DM neuropathy. She has chronic digital contractions (R worse then L) which predated her leg pain and paralysis and follows with [facility]. Her gabapentin was changed to  Lyrica 25 mg BID and 50 mg QHS. Also started on Flexeril BID prn which significantly helped. There is concern that her uncontrolled pain may have precipitated this FND episode - please follow up her pain and address at follow up.   3. T2DM   On levemir 15 units qhs and SSI at home. Current HbA1c is 7.9. Continue home medications. Started and Lyrica 25 mg BID and 50 mg at night. Also continue Flexiril 5 mg BID.    4. Hx of Vitamin B12 deficiency Current Vitamin B12 level 540 . Continue on B12 1049mcg daily   5.HTN HLD Continued on home medication norvasc 5mg  daily. Simvastatin 40mg  daily changed to Lipitor 20 mg/day   6. Hypothyroidism - uncontrolled after recent decrease in synthroid  Current TSH is 46.431, T3 1.4, T4 0.85. Was on synthroid 171mcg daily at home changed to 125 mcg. Please repeat TSH in 4-6 weeks.    7. Depression On zoloft 100 mg daily at home, changed to Zoloft 150 mg.  Discharge Vitals:   BP (!) 120/59 (BP Location:  Left Arm)   Pulse 70   Temp 97.9 F (36.6 C) (Oral)   Resp 20   Ht 5\' 9"  (1.753 m)   Wt 77.1 kg   SpO2 99%   BMI 25.10 kg/m   Pertinent Labs, Studies, and Procedures:  CBC Latest Ref Rng & Units 06/15/2020 06/13/2020 06/12/2020  WBC 4.0 - 10.5 K/uL 5.5 5.5 5.4  Hemoglobin 12.0 - 15.0 g/dL 10.9(L) 11.0(L) 11.2(L)  Hematocrit 36.0 - 46.0 % 33.2(L) 34.8(L) 34.0(L)  Platelets 150 - 400 K/uL 296 293 291   BMP Latest Ref Rng & Units 06/15/2020 06/13/2020 06/12/2020  Glucose 70 - 99 mg/dL 183(H) 232(H) 243(H)  BUN 8 - 23 mg/dL 37(H) 25(H) 26(H)  Creatinine 0.44 - 1.00 mg/dL 1.40(H) 1.43(H) 1.59(H)  BUN/Creat Ratio 12 - 28 - - -  Sodium 135 - 145 mmol/L 138 140 139  Potassium 3.5 - 5.1 mmol/L 4.1 4.1 4.1  Chloride 98 - 111 mmol/L 103 101 101  CO2 22 - 32 mmol/L 27 30 28   Calcium 8.9 - 10.3 mg/dL 9.3 9.6 9.2   MR lumbar spine:  IMPRESSION: 1. Multilevel degenerative disease without progression from 2020. Instead, there has been regression of a L4-5 right-sided disc extrusion since prior. 2. L5-S1 bilateral subarticular recess stenosis which could affect either S1 nerve root, chronic. 3. L4-5 mild to moderate right foraminal narrowing. 4. Edematous appearance of the partially covered left piriformis, of uncertain importance given contralateral symptoms.  CT Head:  IMPRESSION: No acute intracranial abnormality.  CT Pelvis:  IMPRESSION: 1. No acute osseous abnormality of the pelvis. 2. Mild degenerative changes of the bilateral hips. 3. Circumferential thickening of the urinary bladder wall with mild associated fat stranding. Correlate with urinalysis to exclude cystitis. 4. Mild induration within the subcutaneous fat overlying the right greater trochanter without associated fluid collection or hematoma.   MR Brain:   IMPRESSION: No acute intracranial abnormality or mass.   Discharge Instructions: Discharge Instructions     Call MD for:  difficulty breathing,  headache or visual disturbances   Complete by: As directed    Call MD for:  extreme fatigue   Complete by: As directed    Call MD for:  persistant dizziness or light-headedness   Complete by: As directed    Call MD for:  severe uncontrolled pain   Complete by: As directed    Call MD for:  temperature >100.4   Complete  by: As directed    Diet - low sodium heart healthy   Complete by: As directed    Discharge instructions   Complete by: As directed    Toni Hill: You presented with Right leg pain and paralysis. After extensive work up and imaging studies we found that your pain is due to diabetes leading to nerve damage called diabetic neuropathy. You are started on medication Lyrica to help with this pain along with Flexiril.  You paralysis of Right leg is possibly due to disconnection between your brain and leg. You need to work with Physical therapy to re-establish that connection along with with working actively to establish by yourself.  For example: Whenever you are watching TV and a commercial comes, you should try to move your leg and so on.  We understand your pain is real and happy that is under better control now! We hope you are able to work with rehabilitation and achieve re-gain in your leg movements.   It was pleasure taking care of you!! Happy Holidays and take care!   Increase activity slowly   Complete by: As directed    No wound care   Complete by: As directed        Signed: [name], MD 06/16/2020, 3:03 PM   Pager: [phone]

## 2020-06-17 NOTE — Progress Notes (Signed)
Internal Medicine Clinic Attending  Case discussed with Dr. Krienke at the time of the visit.  We reviewed the resident's history and exam and pertinent patient test results.  I agree with the assessment, diagnosis, and plan of care documented in the resident's note.  Dvid Pendry, M.D., Ph.D.  

## 2020-06-17 NOTE — Chronic Care Management (AMB) (Signed)
  Chronic Care Management   Note  06/17/2020 Name: Toni Hill MRN: [id number] DOB: 1946-02-14  Toni Hill is a 74 y.o. year old female who is a primary care patient of Maudie Mercury, MD. I reached out to [name] by phone today in response to a referral sent by [name] PCP, Maudie Mercury, MD.     [name] was given information about Chronic Care Management services today including:  1. CCM service includes personalized support from designated clinical staff supervised by her physician, including individualized plan of care and coordination with other care providers 2. 24/7 contact phone numbers for assistance for urgent and routine care needs. 3. Service will only be billed when office clinical staff spend 20 minutes or more in a month to coordinate care. 4. Only one practitioner may furnish and bill the service in a calendar month. 5. The patient may stop CCM services at any time (effective at the end of the month) by phone call to the office staff. 6. The patient will be responsible for cost sharing (co-pay) of up to 20% of the service fee (after annual deductible is met).  Patient did not agree to enrollment in care management services and does not wish to consider at this time.  Follow up plan: Patient declines engagement by the care management team at this due to being in a SNF. Appropriate care team members and provider have been notified via electronic communication. The care management team is available to follow up with the patient after provider conversation with the patient regarding recommendation for care management engagement and subsequent re-referral to the care management team. The patient has been provided with contact information for the care management team and has been advised to call with any health related questions or concerns.   [facility]

## 2020-07-07 IMAGING — CT CT ABD-PELV W/O CM
2 of 4 series · 15 of 46 positions shown, 17 images · non-contrast
Comparison: CT scan 11/20/2016

CLINICAL DATA: Epigastric abdominal pain, nausea and vomiting since
colonoscopy prep in [REDACTED].

EXAM:
CT ABDOMEN AND PELVIS WITHOUT CONTRAST
TECHNIQUE: Multidetector CT imaging of the abdomen and pelvis was performed
following the standard protocol without IV contrast.

[Series 3: ap without · axial · non-contrast · 0.83mm/px · z∈[-515,-50]mm · 12 of 105 slices shown, 14 images]
[im 6/105  soft-tissue]
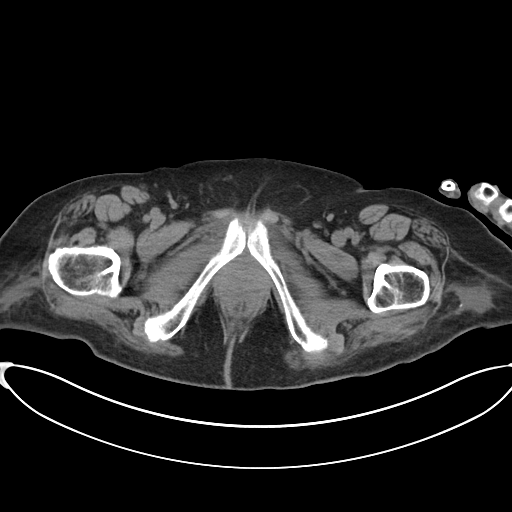
[im 6/105  bone]
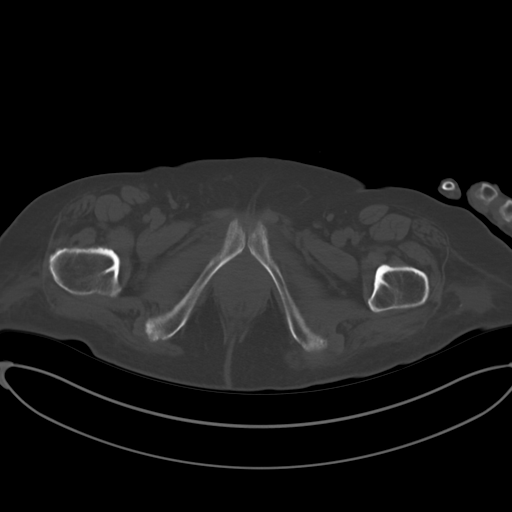
[im 17/105  soft-tissue]
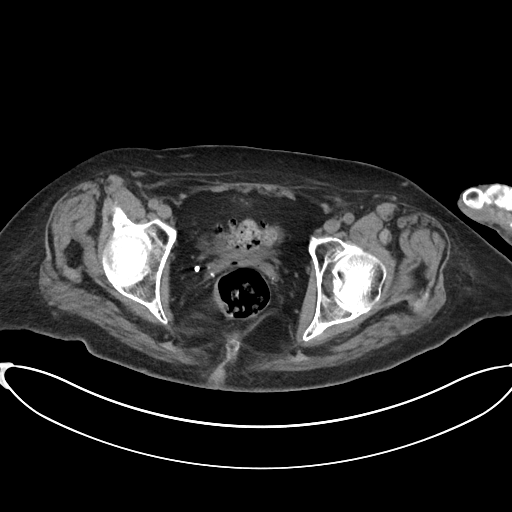
[im 22/105  soft-tissue]
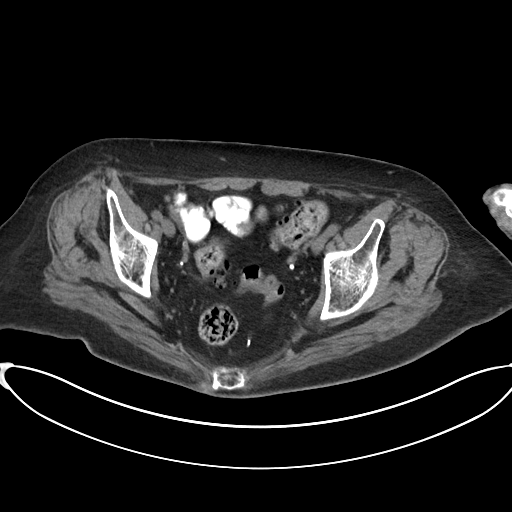
[im 33/105  soft-tissue]
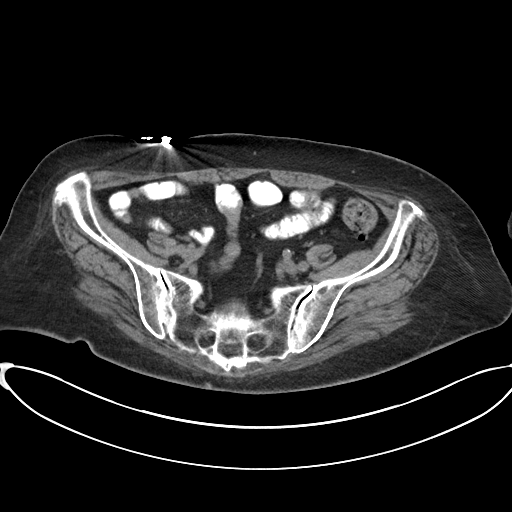
[im 39/105  soft-tissue]
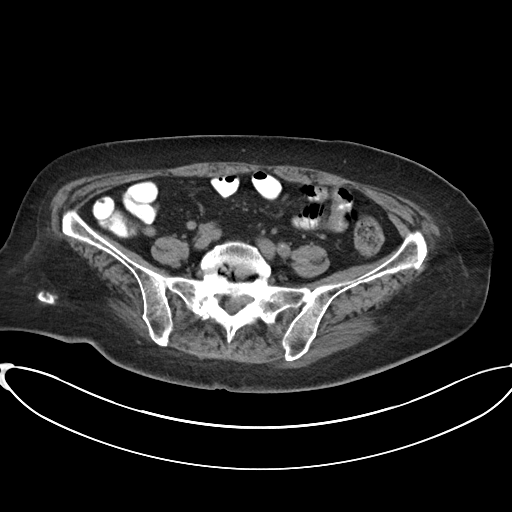
[im 50/105  soft-tissue]
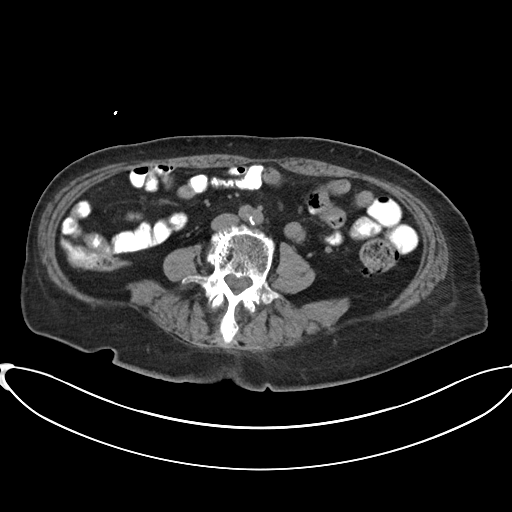
[im 55/105  soft-tissue]
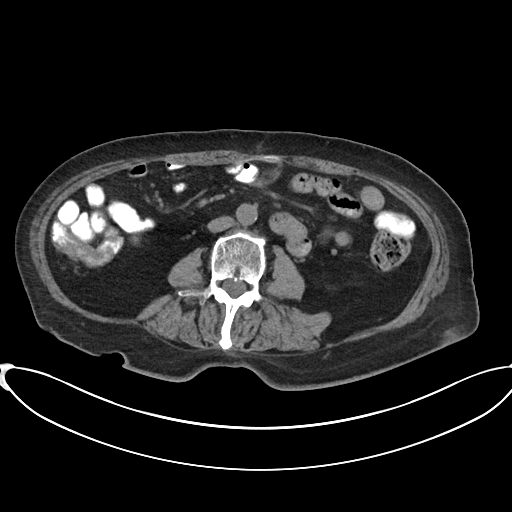
[im 66/105  soft-tissue]
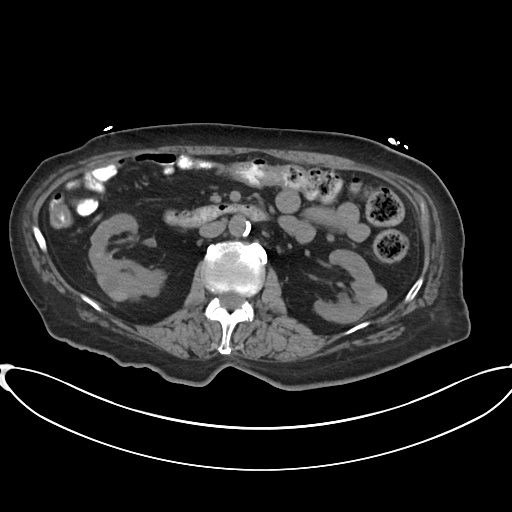
[im 72/105  soft-tissue]
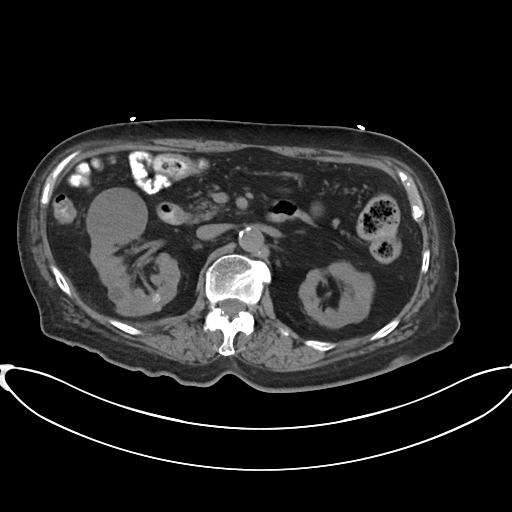
[im 72/105  bone]
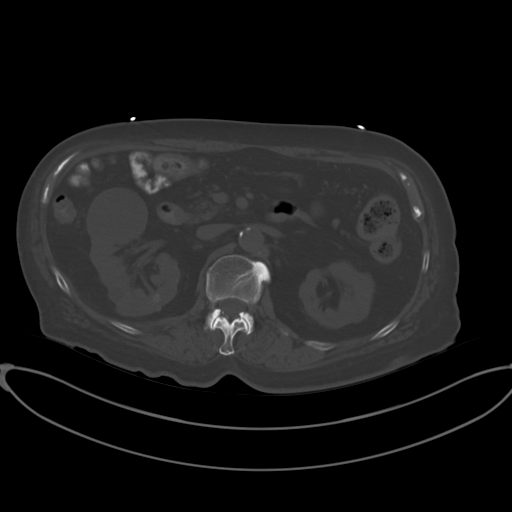
[im 83/105  soft-tissue]
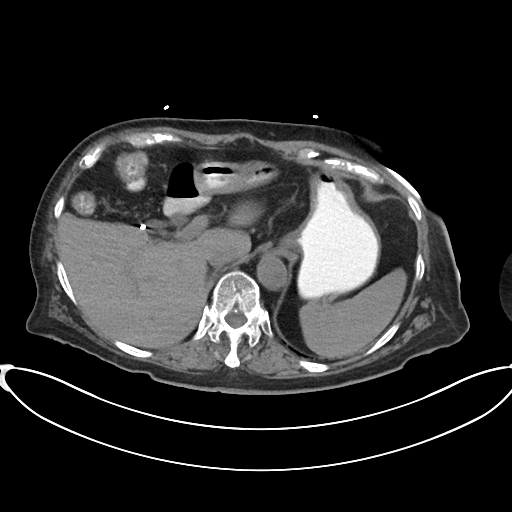
[im 88/105  soft-tissue]
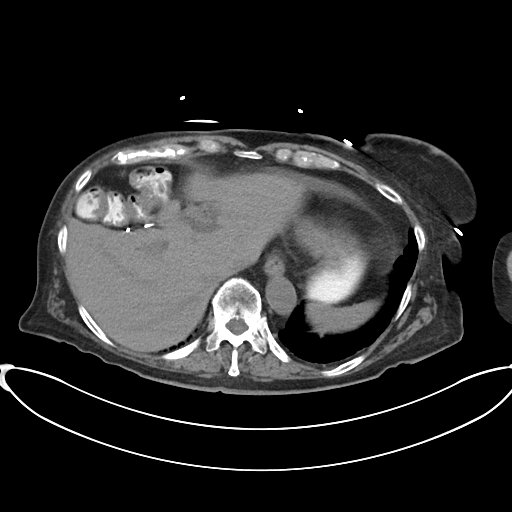
[im 99/105  soft-tissue]
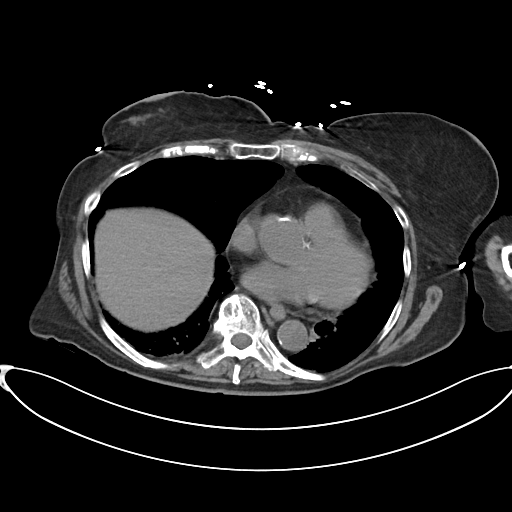

[Series 6: cor · coronal · 0.82mm/px · 3 of 85 slices shown]
[im 29/85  soft-tissue]
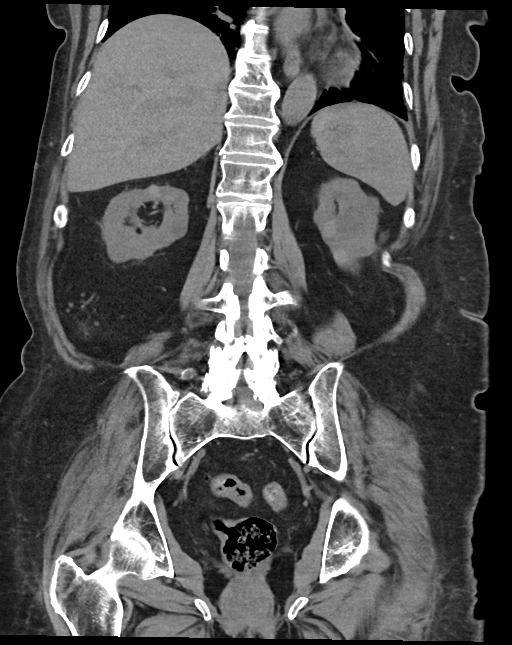
[im 38/85  soft-tissue]
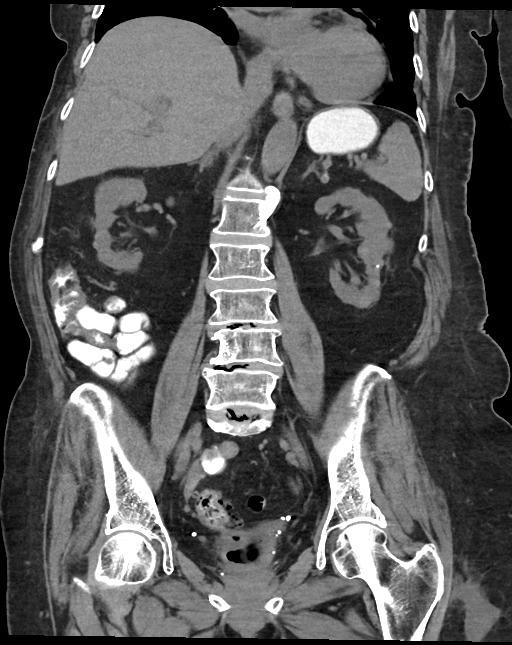
[im 47/85  soft-tissue]
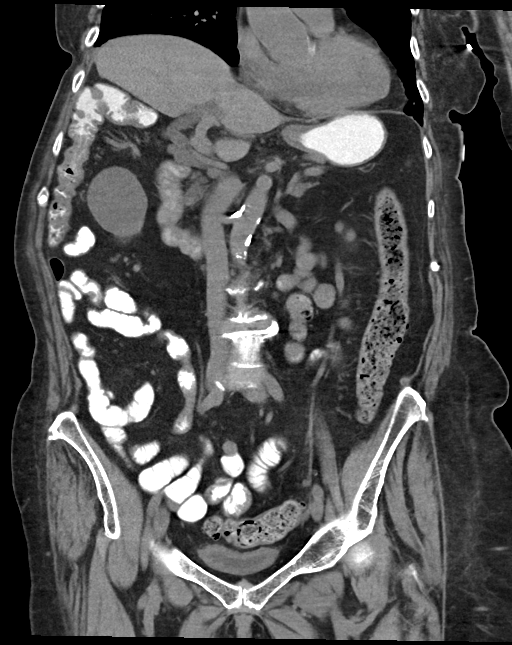

[15 of 46 positions shown; findings below may reference images not displayed]

FINDINGS: Lower chest: Streaky bibasilar atelectasis and basilar scarring
changes. No worrisome pulmonary lesions or pleural effusion. The
heart is normal in size. Prominent pericardial fat. Aortic and
coronary artery calcifications are noted.

Hepatobiliary: No hepatic lesions are identified without contrast.
The gallbladder is surgically absent. Mild associated common bile
duct dilatation.

Pancreas: No mass, inflammation or ductal dilatation. Stable mild to
moderate pancreatic atrophy.

Spleen: Normal size. No focal lesions.

Adrenals/Urinary Tract: Stable 2.5 cm left adrenal gland lesion
consistent with benign adenoma. The right adrenal gland is normal.

Bilateral renal cysts, some of which are hyperdense/hemorrhagic.
Stable right renal parenchymal calcification. No
hydroureteronephrosis.

The bladder is grossly normal.

Stomach/Bowel: The stomach, duodenum, small bowel and colon are
unremarkable. No acute inflammatory changes, mass lesions or
obstructive findings. The terminal ileum is normal. The appendix is
not identified for certain.

Vascular/Lymphatic: Moderate to advanced atherosclerotic
calcifications involving the aorta and branch vessel ostia. No
aneurysm. No mesenteric or retroperitoneal mass or adenopathy.

Reproductive: The uterus is surgically absent. Both ovaries are
still present and appear normal.

Other: No pelvic mass or adenopathy. No free pelvic fluid
collections. No inguinal mass or adenopathy. No abdominal wall
hernia or subcutaneous lesions.

Musculoskeletal: No significant bony findings.
IMPRESSION: 1. No acute abdominal/pelvic findings, mass lesions or adenopathy.
2. Status post cholecystectomy. Mild associated common bile duct
dilatation.
3. Stable left adrenal gland adenoma.
4. Bilateral renal cysts, some of which are hyperdense/hemorrhagic.
5. Aortic atherosclerosis.

Aortic Atherosclerosis ([id number]).

## 2020-07-18 ENCOUNTER — Other Ambulatory Visit: Payer: Self-pay | Admitting: Internal Medicine

## 2020-08-02 ENCOUNTER — Other Ambulatory Visit: Payer: Self-pay | Admitting: Student in an Organized Health Care Education/Training Program

## 2020-08-15 ENCOUNTER — Other Ambulatory Visit: Payer: Self-pay | Admitting: Internal Medicine

## 2020-08-15 ENCOUNTER — Other Ambulatory Visit: Payer: Self-pay | Admitting: Physician Assistant

## 2020-08-15 DIAGNOSIS — Z794 Long term (current) use of insulin: Secondary | ICD-10-CM

## 2020-08-15 DIAGNOSIS — E1140 Type 2 diabetes mellitus with diabetic neuropathy, unspecified: Secondary | ICD-10-CM

## 2020-09-04 ENCOUNTER — Telehealth: Payer: Self-pay | Admitting: *Deleted

## 2020-09-04 NOTE — Telephone Encounter (Signed)
Gave her a phone number for [employer].

## 2020-09-04 NOTE — Telephone Encounter (Addendum)
Patient scheduled appt for tomorrow then was transferred to Triage. Patient states she has no way to get to her appt tomorrow. States she is paralyzed from the waist down on her right side. States she has no ramp or w/c and needs a stretcher. Patient was offered Tele appt but declined. States she needs doctor to see her. Dicussed calling non-emergent EMS for transport. Also, advised patient to contact her Nurse Navigator at [facility] to see if they can provide transportation. She will call [employer] now.

## 2020-09-05 ENCOUNTER — Other Ambulatory Visit: Payer: Self-pay

## 2020-09-05 ENCOUNTER — Other Ambulatory Visit: Payer: Self-pay | Admitting: *Deleted

## 2020-09-05 ENCOUNTER — Encounter: Payer: Self-pay | Admitting: Internal Medicine

## 2020-09-05 ENCOUNTER — Ambulatory Visit (INDEPENDENT_AMBULATORY_CARE_PROVIDER_SITE_OTHER): Payer: Medicare Other | Admitting: Internal Medicine

## 2020-09-05 VITALS — BP 137/74 | HR 81 | Temp 98.1°F | Ht 69.0 in

## 2020-09-05 DIAGNOSIS — R29898 Other symptoms and signs involving the musculoskeletal system: Secondary | ICD-10-CM | POA: Diagnosis not present

## 2020-09-05 DIAGNOSIS — F444 Conversion disorder with motor symptom or deficit: Secondary | ICD-10-CM | POA: Diagnosis not present

## 2020-09-05 DIAGNOSIS — K5900 Constipation, unspecified: Secondary | ICD-10-CM

## 2020-09-05 DIAGNOSIS — E039 Hypothyroidism, unspecified: Secondary | ICD-10-CM | POA: Diagnosis not present

## 2020-09-05 DIAGNOSIS — R112 Nausea with vomiting, unspecified: Secondary | ICD-10-CM | POA: Diagnosis not present

## 2020-09-05 DIAGNOSIS — E1140 Type 2 diabetes mellitus with diabetic neuropathy, unspecified: Secondary | ICD-10-CM | POA: Diagnosis not present

## 2020-09-05 DIAGNOSIS — G6289 Other specified polyneuropathies: Secondary | ICD-10-CM

## 2020-09-05 DIAGNOSIS — I10 Essential (primary) hypertension: Secondary | ICD-10-CM | POA: Diagnosis not present

## 2020-09-05 DIAGNOSIS — Z794 Long term (current) use of insulin: Secondary | ICD-10-CM | POA: Diagnosis not present

## 2020-09-05 LAB — POCT GLYCOSYLATED HEMOGLOBIN (HGB A1C): Hemoglobin A1C: 8.1 % — AB (ref 4.0–5.6)

## 2020-09-05 LAB — GLUCOSE, CAPILLARY: Glucose-Capillary: 192 mg/dL — ABNORMAL HIGH (ref 70–99)

## 2020-09-05 MED ORDER — PREGABALIN 25 MG PO CAPS: 25.0000 mg | ORAL_CAPSULE | Freq: Two times a day (BID) | ORAL | 0 refills | Status: DC

## 2020-09-05 MED ORDER — ONDANSETRON HCL 4 MG PO TABS: 4.0000 mg | ORAL_TABLET | Freq: Two times a day (BID) | ORAL | 0 refills | Status: DC

## 2020-09-05 MED ORDER — PREGABALIN 50 MG PO CAPS: 50.0000 mg | ORAL_CAPSULE | Freq: Every day | ORAL | 0 refills | Status: DC

## 2020-09-05 MED ORDER — ONDANSETRON HCL 4 MG PO TABS
4.0000 mg | ORAL_TABLET | Freq: Two times a day (BID) | ORAL | 0 refills | Status: DC
Start: 2020-09-05 — End: 2021-01-16

## 2020-09-05 NOTE — Telephone Encounter (Signed)
Call from pt - stated she was seen today and her rxs were sent to the wrong pharmacy. Ondansetron and Lyrica need to be sent to [facility] in [place] Also stated Dr Gilford Rile was going to order a laxative. Stated she needs these today. Thanks

## 2020-09-05 NOTE — Progress Notes (Signed)
   CC: Hospital Follow Up  HPI:  Toni Hill is a 75 y.o. M/F, with a PMH noted below, who presents to the clinic Hospital follow up. To see the management of their acute and chronic conditions, please see the A&P note under the Encounters tab.   Past Medical History:  Diagnosis Date  . Bilateral lower extremity edema    right > left  . Bipolar 1 disorder ([place])   . CKD (chronic kidney disease) stage 3, GFR 30-59 ml/min (HCC)     Dr Lawson Radar, [facility]  . Diabetes, polyneuropathy ([place])    FEET AND FINGER TIPS  . Fibromyalgia   . GERD (gastroesophageal reflux disease)   . History of chronic gastritis   . History of COVID-24 Aug 2019  . Hyperlipidemia   . Hypertension    per pt was take off bp medication because of kidney disease told by her nephrologist  . Hypothyroidism   . Mixed incontinence urge and stress   . OA (osteoarthritis)    KNEES  . OAB (overactive bladder)   . PONV (postoperative nausea and vomiting)   . 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 Type 2 diabetes mellitus treated with insulin ([place])   . Wears glasses    Review of Systems:   Review of Systems  Constitutional: Positive for weight loss. Negative for chills, fever and malaise/fatigue.  Cardiovascular: Positive for leg swelling. Negative for chest pain, palpitations and orthopnea.  Gastrointestinal: Positive for constipation. Negative for abdominal pain, diarrhea, nausea and vomiting.     Physical Exam:  Vitals:   09/05/20 1020  BP: 137/74  Pulse: 81  Temp: 98.1 F (36.7 C)  TempSrc: Oral  SpO2: 100%  Height: 5\' 9"  (1.753 m)   Physical Exam Constitutional:      General: She is not in acute distress.    Appearance: She is not toxic-appearing or diaphoretic.     Comments: Stretcher bound, answers questions appropriately.   HENT:     Head: Normocephalic and atraumatic.  Cardiovascular:     Rate and Rhythm: Normal rate and regular rhythm.     Pulses: Normal pulses.      Heart sounds: Normal heart sounds. No murmur heard. No friction rub. No gallop.   Pulmonary:     Effort: Pulmonary effort is normal.     Breath sounds: Normal breath sounds. No wheezing, rhonchi or rales.  Abdominal:     General: Abdomen is flat. Bowel sounds are normal.     Palpations: Abdomen is soft.     Tenderness: There is no abdominal tenderness.  Musculoskeletal:     Comments: Pain elicited on passive of the RLE, strength 1/5 on RLE Trace pitting edema in RLE.    5/5 strength on the LLE.   Neurological:     Mental Status: She is alert and oriented to person, place, and time.  Psychiatric:        Behavior: Behavior normal.      Assessment & Plan:   See Encounters Tab for problem based charting.  Patient discussed with Dr. Jimmye Norman

## 2020-09-05 NOTE — Telephone Encounter (Signed)
Also stated Dr Gilford Rile was going to order a laxative

## 2020-09-05 NOTE — Patient Instructions (Addendum)
To [facility],  I am so glad to see you again! I am sorry to see you having to come in on a stretcher, but it appears that you have good company watching you. Today we discussed your recent hospital visit and stay during your skilled nursing facility. I will be working with our case management team to ensure you are able to get your physical therapy, occupational therapy, and home nurse. I will additionally refill your ondansetron and Lyrica at today's visit.   I hope everything goes well at home, and I hope that your separation and divorce proceedings go as smoothly as they can. Continue working with PT/OT and hopefully we can see progress with your right leg.   I will get some blood work today and call you back with the results when they come back in. Be safe and have a good day!  Sincerely,  Maudie Mercury, MD

## 2020-09-06 LAB — BMP8+ANION GAP
Anion Gap: 17 mmol/L (ref 10.0–18.0)
BUN/Creatinine Ratio: 21 (ref 12–28)
BUN: 27 mg/dL (ref 8–27)
CO2: 25 mmol/L (ref 20–29)
Calcium: 9.7 mg/dL (ref 8.7–10.3)
Chloride: 102 mmol/L (ref 96–106)
Creatinine, Ser: 1.27 mg/dL — ABNORMAL HIGH (ref 0.57–1.00)
Glucose: 219 mg/dL — ABNORMAL HIGH (ref 65–99)
Potassium: 4.6 mmol/L (ref 3.5–5.2)
Sodium: 144 mmol/L (ref 134–144)
eGFR: 45 mL/min/{1.73_m2} — ABNORMAL LOW (ref >59)

## 2020-09-06 LAB — MAGNESIUM: Magnesium: 1.8 mg/dL (ref 1.6–2.3)

## 2020-09-06 LAB — TSH: TSH: 10.7 u[IU]/mL — ABNORMAL HIGH (ref 0.450–4.500)

## 2020-09-08 ENCOUNTER — Encounter: Payer: Self-pay | Admitting: Internal Medicine

## 2020-09-08 MED ORDER — SENNOSIDES-DOCUSATE SODIUM 8.6-50 MG PO TABS: 1.0000 | ORAL_TABLET | Freq: Every day | ORAL | 2 refills | Status: AC

## 2020-09-08 NOTE — Addendum Note (Signed)
Addended by: Maudie Mercury C on: 09/08/2020 09:13 AM   Modules accepted: Orders

## 2020-09-08 NOTE — Assessment & Plan Note (Addendum)
Patient presents after a hospitalization and SNF placement. She is currently on Levemir 15 Units nightly and novolog 2-6 Units three times daily with meals. Her meter is not present today, but will assess her A1c today, may need to titrate her insulin regimen once we assess her glucose readings.  - A1c today - BMP today - Continue current regimen - Follow up 3 months

## 2020-09-08 NOTE — Assessment & Plan Note (Addendum)
Patient presenting for a hospital follow up visit. During her hospital stay, her levothyroxine was increased to 125 mg daily. Will reassess TSH and titrate as needed.  - TSH today - If elevated, will increase dose and have TSH FU in 6 weeks time.  - Continue levothyroxine 125 mg daily.

## 2020-09-08 NOTE — Assessment & Plan Note (Signed)
Patient with a Hx of diabetic polyneuropathy presenting for a hospital follow up. During her hospital course, her gabapentin was changed to Lyrica for which the patient finds symptomatic relief. Will continue current regimen.  - Continue Lyrica 25 mg twice daily  - Continue Lyrica 50 mg nightly

## 2020-09-08 NOTE — Assessment & Plan Note (Addendum)
Found to have an elevated prolonged Qtc during her hospital course. She has taken zofran chronically for her nausea and vomiting. Will check Mg today and replete if low. Refill Zofran - Mg - Refill Zofran 4mg 

## 2020-09-08 NOTE — Assessment & Plan Note (Addendum)
Patient with pressures at today's visit of:  Vitals with BMI 09/05/2020 06/16/2020 06/16/2020  Height 5\' 9"  - -  Weight - - -  BMI - - -  Systolic [phone]  Diastolic 74 70 66  Pulse 81 73 66   Patient currently on amlodipine 5 mg daily. Patient tolerates her BP medication well with no side effects.  - Continue current regimen.

## 2020-09-08 NOTE — Assessment & Plan Note (Signed)
Patient presents to the clinic with complaints of constipation. She is having hard, pellet like stool that has not been relieved with fluids or miralax.  - Senokot S daily

## 2020-09-08 NOTE — Assessment & Plan Note (Addendum)
Patient presents for follow up on her functional neurological symptom disorder with paralysis/weakness of her RLE. She has had an extensive workup with imaging of her Lumbar spine, pelvis, head, and brain with no etiology found. During her hospital stay neurology was consulted and there was no neurological intervention found. Symptoms appeared to align with function neurological symptom order. Psych was consulted and patient's zoloft was increased to 150 mg daily and referred to counseling in the outpatient setting. PT was recommended during her inpatient course.  - Continue Zoloft 150 mg daily -  PT/OT, nurse [facility] referral placed.  - FU counseling appointment

## 2020-09-12 ENCOUNTER — Telehealth: Payer: Self-pay

## 2020-09-12 NOTE — Telephone Encounter (Signed)
pt is needed a call back about her   pregabalin (LYRICA) 50 MG capsule

## 2020-09-12 NOTE — Telephone Encounter (Signed)
Return pt's call - asking about Lyrica;states she only taking once a day and she thought Dr Gilford Rile had changed it. See 3/4 encounter -  " Continue Lyrica 25 mg twice daily  - Continue Lyrica 50 mg nightly " Also states the laxative which was ordered has not worked yet. Thanks

## 2020-09-15 ENCOUNTER — Telehealth: Payer: Self-pay | Admitting: *Deleted

## 2020-09-15 NOTE — Telephone Encounter (Signed)
Toni Hill with [facility] called in requesting VO for [facility] 1 week 1, 2 week 4, and 1 week 3 to work on increasing ROM, and functional flexibility, manual therapy for soft tissue joints (right knee, hip, and ankle), fall prevention, and infection control. Verbal auth given. Will route to Red Team for agreement/denial.

## 2020-09-16 MED ORDER — PREGABALIN 25 MG PO CAPS: 25.0000 mg | ORAL_CAPSULE | Freq: Two times a day (BID) | ORAL | 3 refills | Status: DC

## 2020-09-16 MED ORDER — LEVOTHYROXINE SODIUM 137 MCG PO TABS: 137.0000 ug | ORAL_TABLET | Freq: Every day | ORAL | 3 refills | Status: DC

## 2020-09-16 NOTE — Telephone Encounter (Signed)
Agree with plan 

## 2020-09-16 NOTE — Telephone Encounter (Signed)
[place] Pack, LPN with [facility] called yesterday to let us know patient declined [facility] OT for that day.

## 2020-09-16 NOTE — Progress Notes (Signed)
Internal Medicine Clinic Attending ? ?Case discussed with Dr. Winters  At the time of the visit.  We reviewed the resident?s history and exam and pertinent patient test results.  I agree with the assessment, diagnosis, and plan of care documented in the resident?s note.  ?

## 2020-09-17 ENCOUNTER — Telehealth: Payer: Self-pay

## 2020-09-17 MED ORDER — PREGABALIN 25 MG PO CAPS: 25.0000 mg | ORAL_CAPSULE | Freq: Two times a day (BID) | ORAL | 3 refills | Status: DC

## 2020-09-17 NOTE — Telephone Encounter (Signed)
Received TC from patient who states [facility] [place] [place]  She states all RX's were transferred to the correct [facility] [place], but the Lyrica 25mg  because it is controlled and pharmacist was unable to transfer.  Sending to [facility] [place].  Pt also asking how she should take Lyrica, she has 25mg  and the Lyrica 50mg  capsule.  Per LOV notes, RN instructed patient Lyrica 25mg , take 1 capsule, twice daily and Lyrica 50mg  capsule , take 1 at night/bedtime.  She verbalized understanding. SChaplin, RN,BSN

## 2020-09-23 ENCOUNTER — Telehealth: Payer: Self-pay | Admitting: Dietician

## 2020-09-23 NOTE — Telephone Encounter (Signed)
Sister bringing paperwork today. Needs someone to met her at [facility]. Needs Dr.Winters to complete and return in next 10 days.

## 2020-09-24 ENCOUNTER — Telehealth: Payer: Self-pay

## 2020-09-24 NOTE — Telephone Encounter (Signed)
Pt is requesting  A call back about pain medication that is not on her list  Lidocaine

## 2020-09-24 NOTE — Telephone Encounter (Signed)
Return pt's call - stated when she was in rehab, she had pain patches prescribed ; on for 12 hr and off for 12 hr; she's unsure of name but starts with "L". They were applied to her right knee and ankle for the pain. Requesting to be back on these.

## 2020-09-25 ENCOUNTER — Other Ambulatory Visit: Payer: Self-pay

## 2020-09-25 MED ORDER — CYCLOBENZAPRINE HCL 5 MG PO TABS: 5.0000 mg | ORAL_TABLET | Freq: Two times a day (BID) | ORAL | 0 refills | Status: DC

## 2020-09-25 NOTE — Telephone Encounter (Signed)
Return call to [facility] , [place] states pt is having a lot of muscle spasms which is limiting PT, leg movement. Asking fro a refill on Flexeril. Also requesting verbal order for "SW for medication education and affordability and community resources"  VO given - if inappropriate, let me know. Thanks

## 2020-09-25 NOTE — Telephone Encounter (Signed)
[facility] is requesting a call back

## 2020-09-28 ENCOUNTER — Other Ambulatory Visit: Payer: Self-pay | Admitting: Internal Medicine

## 2020-09-29 ENCOUNTER — Other Ambulatory Visit: Payer: Self-pay

## 2020-09-29 ENCOUNTER — Telehealth: Payer: Self-pay

## 2020-09-29 MED ORDER — CYCLOBENZAPRINE HCL 5 MG PO TABS: 5.0000 mg | ORAL_TABLET | Freq: Two times a day (BID) | ORAL | 3 refills | Status: DC

## 2020-09-29 MED ORDER — LIDOCAINE 5 % EX PTCH: 1.0000 | MEDICATED_PATCH | CUTANEOUS | 0 refills | Status: DC

## 2020-09-29 NOTE — Telephone Encounter (Signed)
Received TC from [place], [place] with [facility], who states she called patient last night to set up a visit for tomorrow and pt was c/o pain and stiffness in her legs & feet, primarily her right leg.  She is asking for pain medication.  Per chart review, [facility] received a TC from [place] on 3/24 asking for flexeril for same reason.  This RN asked if the flexeril was not relieving any of the pain & stiffness.  Selinda Eon states she was unaware the RX had been sent to pharmacy and states pt was also not aware.   This RN informed Selinda Eon it was sent to pharmacy the day PT requested the RX.  RN instructed PT to use the flexeril as directed and if it is not effective to call [facility] back.  She verbalized understanding. SChaplin, RN,BSN

## 2020-09-29 NOTE — Telephone Encounter (Signed)
Requesting to speak with a nurse about getting refill on pain patch. Please call pt back.

## 2020-09-29 NOTE — Telephone Encounter (Signed)
See today's encounter 3/25.

## 2020-09-29 NOTE — Telephone Encounter (Signed)
Return pt's call - states she needs Lidocaine patches for her leg pain (see previous encounter). I asked if she has started taking Flexeril; she states yes but only once a day, she only got #30 tabs from the pharmacy. States she has not notice any difference in the pain yet since only taking it once a day. Order written BID with #30 tabs instead of 60. Also she ask about CAP form; call transfer to [facility].

## 2020-10-01 ENCOUNTER — Telehealth: Payer: Self-pay | Admitting: *Deleted

## 2020-10-01 NOTE — Telephone Encounter (Addendum)
Information was faxed to [facility] for PA for Cyclobenzaprine,  Awaiting determination.  Sander Nephew, RN 10/01/2020 4:45 PM. PA for Cyclobenzaprine was approved 07/05/2020 thru 10/01/2021.  Sander Nephew, RN 10/03/2020 11:2 AM.

## 2020-10-02 ENCOUNTER — Other Ambulatory Visit: Payer: Self-pay | Admitting: *Deleted

## 2020-10-02 MED ORDER — ATORVASTATIN CALCIUM 20 MG PO TABS: 20.0000 mg | ORAL_TABLET | Freq: Every day | ORAL | 2 refills | Status: DC

## 2020-10-03 ENCOUNTER — Telehealth: Payer: Self-pay | Admitting: *Deleted

## 2020-10-03 NOTE — Telephone Encounter (Signed)
PA for Lidocaine Patches was approved 07/05/2020 thru 10/01/2021.  Sander Nephew, RN 10/03/2021 11:8 AM.

## 2020-10-08 ENCOUNTER — Encounter: Payer: Self-pay | Admitting: Student

## 2020-10-08 ENCOUNTER — Other Ambulatory Visit: Payer: Self-pay

## 2020-10-08 ENCOUNTER — Ambulatory Visit (INDEPENDENT_AMBULATORY_CARE_PROVIDER_SITE_OTHER): Payer: Medicare Other | Admitting: Student

## 2020-10-08 ENCOUNTER — Telehealth: Payer: Self-pay | Admitting: *Deleted

## 2020-10-08 DIAGNOSIS — G6289 Other specified polyneuropathies: Secondary | ICD-10-CM

## 2020-10-08 DIAGNOSIS — F444 Conversion disorder with motor symptom or deficit: Secondary | ICD-10-CM | POA: Diagnosis not present

## 2020-10-08 DIAGNOSIS — E039 Hypothyroidism, unspecified: Secondary | ICD-10-CM

## 2020-10-08 NOTE — Progress Notes (Signed)
   CC: Hip pain  This is a telephone encounter between Toni Hill and Toni Hill on 10/08/2020 for worsening hip/right leg pain. The visit was conducted with the patient located at home and Toni Hill at [facility]. The patient's identity was confirmed using their DOB and current address. The patient has consented to being evaluated through a telephone encounter and understands the associated risks (an examination cannot be done and the patient may need to come in for an appointment) / benefits (allows the patient to remain at home, decreasing exposure to coronavirus). I personally spent 22 minutes on medical discussion.   HPI:  [name] is a 75 y.o. with PMH as below.   Please see A&P for assessment of the patient's acute and chronic medical conditions.   Past Medical History:  Diagnosis Date  . Bilateral lower extremity edema    right > left  . Bipolar 1 disorder ([place])   . CKD (chronic kidney disease) stage 3, GFR 30-59 ml/min (HCC)     Dr Lawson Radar, [facility]  . Diabetes, polyneuropathy ([place])    FEET AND FINGER TIPS  . Fibromyalgia   . GERD (gastroesophageal reflux disease)   . History of chronic gastritis   . History of COVID-24 Aug 2019  . Hyperlipidemia   . Hypertension    per pt was take off bp medication because of kidney disease told by her nephrologist  . Hypothyroidism   . Mixed incontinence urge and stress   . OA (osteoarthritis)    KNEES  . OAB (overactive bladder)   . PONV (postoperative nausea and vomiting)   . 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 Type 2 diabetes mellitus treated with insulin ([place])   . Wears glasses    Review of Systems:  Patient endorses burning and tingling, right leg pain, difficulty walking or moving right leg but denies any injury to the leg or swelling.  Assessment & Plan:   See Encounters Tab for problem based charting.  Patient discussed with Dr. Amie Critchley, MD, MPH

## 2020-10-08 NOTE — Telephone Encounter (Signed)
Patient called in crying, c/o severe right hip pain since last night. States she felt well yesterday and had [facility]. Then last night the pain woke her up. It is unrelieved with lyrica. States she is bed bound and cannot come to clinic. Tele appt given with Red Team this afternoon.

## 2020-10-08 NOTE — Assessment & Plan Note (Signed)
Patient continues to endorse worsening burning pain in her right lower extremity.  She has been prescribed Lyrica 50 mg nightly +25 mg twice daily.  However, patient states she only takes Lyrica 50 mg nightly and does not have 25 mg pills.  Her neuropathic pain is likely uncontrolled due to not taking the recommended doses.  Plan: --Continue Lyrica 50 mg nightly --Start taking Lyrica 50 mg daily --Advised to call office if symptoms do not improve.

## 2020-10-08 NOTE — Assessment & Plan Note (Signed)
Patient due for TSH check in about 2 weeks. --Continue levothyroxine 137 mg daily

## 2020-10-08 NOTE — Assessment & Plan Note (Addendum)
[name] telephone encounter, patient states he started having worsening right lower extremity pain after working with PT yesterday. She described the pain as burning and painful from her right thigh all the way down to her toes.  She is unable to move her right lower extremity in any significant movements worsen the pain. She is currently on Lyrica, Flexeril and lidocaine patch but the pain has not improved from current doses of these medications. States she is bedridden and only able to come to the office via ambulance which is too expensive for her.  According to patient, she is only taking Lyrica 50 mg at night and does not have a prescription for 25 mg twice daily.  She would like an increased dose of her Flexeril to help with her pain and spasms.  Plan: --Advised to take 2 tablets of Flexeril 5 mg twice daily and report to the office if this helps her. --Advised to take Lyrica 50 mg daily in addition to her nighttime 50 mg dose.  Will reassess and titrate up as needed to control her neuropathic pain. --Patient will need further evaluation by neurology in the near future.

## 2020-10-08 NOTE — Telephone Encounter (Signed)
Sounds good. Thank you

## 2020-10-10 ENCOUNTER — Telehealth: Payer: Self-pay

## 2020-10-10 NOTE — Telephone Encounter (Signed)
Yes, that is right.

## 2020-10-10 NOTE — Telephone Encounter (Signed)
Pls contact Selinda Eon from [place] regarding medicine

## 2020-10-10 NOTE — Progress Notes (Signed)
Internal Medicine Clinic Attending  Case discussed with Dr. Coy Saunas at the time of the visit.  We reviewed the resident's history and exam and pertinent patient test results.  I agree with the assessment, diagnosis, and plan of care documented in the resident's note.  Still unclear etiology of peripheral neuropathy with symptoms suggestive of radiculopathy (both sensory and motor deficits) but unusual distribution. Has negative MRI of brain and lumbar spine, did not have MRI of C or T spine and no EMG/NCS thus far. Also was not examined by neurology at last hospitalization, just discussed case over phone.   Given severity of symptoms (she is bedbound and having severe pain), important to fully work up her neuropathy, she should be fully evaluated by neurology and likely complete EMG/NCS and possibly nerve biopsy.   In the meantime, reasonable to make the medication changes as per Dr. Coy Saunas to try to manage her symptoms.   Lenice Pressman, M.D., Ph.D.

## 2020-10-10 NOTE — Telephone Encounter (Signed)
Return call to Toni Hill, [place] who stated the pt told her the doctor said she could increase Flexeril to 2 tabs BID. Pt had telehealth visit yesterday w/Dr Theadora Rama; according to chart nurse informed this is correct.   "Plan: --Advised to take 2 tablets of Flexeril 5 mg twice daily and report to the office if this helps her."

## 2020-10-13 ENCOUNTER — Telehealth: Payer: Self-pay | Admitting: Student

## 2020-10-13 ENCOUNTER — Telehealth: Payer: Self-pay

## 2020-10-13 NOTE — Telephone Encounter (Signed)
Pt f/u with a request for  Neurology Appt.  Pt states she had a Telehealth visit on 10/08/2020 and was to be referral to [facility] Neurology.  Please advise if a referral was to be placed.

## 2020-10-13 NOTE — Telephone Encounter (Signed)
Sounds good

## 2020-10-13 NOTE — Telephone Encounter (Signed)
RTC to number provided, received VM from Murrell Redden, PT, message left nurse was returning her call. SChaplin, RN,BSN

## 2020-10-13 NOTE — Telephone Encounter (Signed)
Received TC from [place], [place] with [facility].  She is calling to let us know she is placing the patient on hold right now d/t the patient not being able to tolerate any therapy/stretching at this time.  States she will check back with her at a later date, states they have a nurse going out there and will be getting updates from the nurse. SChaplin, RN,BSN

## 2020-10-13 NOTE — Telephone Encounter (Signed)
Almeta Monas from [facility] is requesting a call back in regards to pt

## 2020-10-14 ENCOUNTER — Other Ambulatory Visit: Payer: Self-pay | Admitting: Student

## 2020-10-14 DIAGNOSIS — R29818 Other symptoms and signs involving the nervous system: Secondary | ICD-10-CM

## 2020-10-14 DIAGNOSIS — F444 Conversion disorder with motor symptom or deficit: Secondary | ICD-10-CM

## 2020-10-14 DIAGNOSIS — G6289 Other specified polyneuropathies: Secondary | ICD-10-CM

## 2020-10-14 MED ORDER — PREGABALIN 50 MG PO CAPS: ORAL_CAPSULE | ORAL | 1 refills | Status: DC

## 2020-10-14 NOTE — Progress Notes (Signed)
This is a patient I had a virtual visit with and staffed with Dr. Rebeca Alert. She needs a full evaluation for her recent weakness/neuropathic pain. I have placed the referral to [facility] Neurology. She was only taking her Lyrica at night so I asked her to take another 50 mg during the day and let us know if that helps.

## 2020-10-14 NOTE — Telephone Encounter (Signed)
Yes, she needs the referral. I just placed one to [facility] Neurology for her.

## 2020-10-14 NOTE — Progress Notes (Signed)
Sent over refills for her Lyrica. Changed frequency to 50 mg twice daily.

## 2020-10-21 ENCOUNTER — Telehealth: Payer: Self-pay

## 2020-10-21 NOTE — Telephone Encounter (Signed)
Return pt's call - requesting an increase of Cyclobenzaprine 5 mg BID but stated she was told by the doctor she could increase to 2 tabs BID. But she's requesting an increase; stated 20 mg total daily is not helping with the pain. Thanks

## 2020-10-21 NOTE — Telephone Encounter (Signed)
Well I have not evaluated this patient so I will have a difficult time determining if she requires an increase in her medication dose. The best I can offer is a follow up appointment to evaluate her concerns.

## 2020-10-21 NOTE — Telephone Encounter (Signed)
Requesting to speak with a nurse about meds.  

## 2020-10-22 NOTE — Telephone Encounter (Signed)
Pt called back to state she was taking Tizanidine 4mg . Cyclobenzaprine is currently on her med list. Informed pt she can discuss this with the doctor tomorrow at telehealth appt.

## 2020-10-22 NOTE — Telephone Encounter (Signed)
Informed pt the doctor would like to discuss her medication. Stated she unable to come in d/t being unable to walk. She's agreeable to a telehealth appt. Call transferred to front office - appt scheduled tomorrow 4/21 with Dr Marianna Payment.

## 2020-10-22 NOTE — Telephone Encounter (Signed)
Called pt - no answer; left message to call the office . 

## 2020-10-22 NOTE — Telephone Encounter (Signed)
I agree

## 2020-10-23 ENCOUNTER — Ambulatory Visit (INDEPENDENT_AMBULATORY_CARE_PROVIDER_SITE_OTHER): Payer: Medicare Other | Admitting: Internal Medicine

## 2020-10-23 ENCOUNTER — Other Ambulatory Visit: Payer: Self-pay

## 2020-10-23 DIAGNOSIS — F444 Conversion disorder with motor symptom or deficit: Secondary | ICD-10-CM | POA: Diagnosis not present

## 2020-10-23 DIAGNOSIS — F319 Bipolar disorder, unspecified: Secondary | ICD-10-CM

## 2020-10-23 MED ORDER — SERTRALINE HCL 100 MG PO TABS: 200.0000 mg | ORAL_TABLET | Freq: Every day | ORAL | 2 refills | Status: DC

## 2020-10-23 MED ORDER — TIZANIDINE HCL 4 MG PO TABS: 4.0000 mg | ORAL_TABLET | Freq: Two times a day (BID) | ORAL | 2 refills | Status: DC

## 2020-10-23 NOTE — Progress Notes (Signed)
I connected with  Toni Hill on 10/23/20 by telephone and verified that I am speaking with the correct person using two identifiers.   I discussed the limitations of evaluation and management by telemedicine. The patient expressed understanding and agreed to proceed.    CC: Right leg pain  This is a telephone encounter between Toni Hill and Toni Hill on 10/23/2020 for right leg pain. The visit was conducted with the patient located at home and Toni Hill at [facility]. The patient's identity was confirmed using their DOB and current address. The patient has consented to being evaluated through a telephone encounter and understands the associated risks (an examination cannot be done and the patient may need to come in for an appointment) / benefits (allows the patient to remain at home, decreasing exposure to coronavirus). I personally spent 12 minutes on medical discussion.   HPI:  [name] is a 75 y.o. with PMH as below.   Please see A&P for assessment of the patient's acute and chronic medical conditions.   Past Medical History:  Diagnosis Date  . Bilateral lower extremity edema    right > left  . Bipolar 1 disorder ([place])   . CKD (chronic kidney disease) stage 3, GFR 30-59 ml/min (HCC)     Dr Lawson Radar, [facility]  . Diabetes, polyneuropathy ([place])    FEET AND FINGER TIPS  . Fibromyalgia   . GERD (gastroesophageal reflux disease)   . History of chronic gastritis   . History of COVID-24 Aug 2019  . Hyperlipidemia   . Hypertension    per pt was take off bp medication because of kidney disease told by her nephrologist  . Hypothyroidism   . Mixed incontinence urge and stress   . OA (osteoarthritis)    KNEES  . OAB (overactive bladder)   . PONV (postoperative nausea and vomiting)   . 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 Type 2 diabetes mellitus treated with insulin ([place])   . Wears glasses    Review of Systems:  Negative except what is noted  on assessment and plan.    Assessment & Plan:   See Encounters Tab for problem based charting.  Patient discussed with Dr. Dareen Piano
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
## 2020-10-25 ENCOUNTER — Other Ambulatory Visit: Payer: Self-pay | Admitting: Internal Medicine

## 2020-10-27 MED ORDER — PREGABALIN 50 MG PO CAPS: 50.0000 mg | ORAL_CAPSULE | Freq: Three times a day (TID) | ORAL | 1 refills | Status: DC

## 2020-10-27 NOTE — Progress Notes (Signed)
Internal Medicine Clinic Attending  Case discussed with Dr. Coe  At the time of the visit.  We reviewed the resident's history and exam and pertinent patient test results.  I agree with the assessment, diagnosis, and plan of care documented in the resident's note.  

## 2020-10-27 NOTE — Telephone Encounter (Signed)
Needs to speak with a nurse about  pregabalin (LYRICA) 50 MG capsule

## 2020-10-29 ENCOUNTER — Telehealth: Payer: Self-pay | Admitting: Internal Medicine

## 2020-10-29 NOTE — Telephone Encounter (Addendum)
Returned call to [employer] will not pay for TID dosing of her lyrica  Requesting to have rx changed/increased to 100mg  BID to help with foot pain (right). has neurology appt on Mon 5/02.   She also states she increased sertraline to 200 mg as instructed, but still have "crying spells" because she cant get good sleep with the foot pain.  Will send dose change request to appropriate team for review.Toni Hidden Cassady4/27/20222:27 PM

## 2020-10-29 NOTE — Telephone Encounter (Signed)
Refill Request  Pt has questions about the medications listed below and would like a call back.  pregabalin (LYRICA) 50 MG capsule  [id number] [place], [place] - [address] (Ph: [phone]

## 2020-10-30 ENCOUNTER — Telehealth: Payer: Self-pay | Admitting: *Deleted

## 2020-10-30 NOTE — Telephone Encounter (Signed)
I spoke with Regino Schultze and it appears that her lyrica was approved. As for her Sertraline, if she continues to have worsening symptoms depression over the next 2 weeks then we could consider medication changes.

## 2020-10-30 NOTE — Telephone Encounter (Addendum)
Information was sent through CoverMyMeds for PA for Pregabalin 50 mg Capsules.  Awaiting determination.    Sander Nephew, RN 10/30/2020 9:27 AM.  PA is approved 07/05/2020 thru 10/30/2021.  [facility] was called at [phone] to notify of approval.  Sander Nephew, RN 10/30/2020 11:41 AM.

## 2020-10-31 ENCOUNTER — Encounter: Payer: Self-pay | Admitting: Internal Medicine

## 2020-11-03 ENCOUNTER — Ambulatory Visit (INDEPENDENT_AMBULATORY_CARE_PROVIDER_SITE_OTHER): Payer: Medicare Other | Admitting: Diagnostic Neuroimaging

## 2020-11-03 ENCOUNTER — Encounter: Payer: Self-pay | Admitting: Diagnostic Neuroimaging

## 2020-11-03 VITALS — BP 141/74 | HR 86

## 2020-11-03 DIAGNOSIS — R252 Cramp and spasm: Secondary | ICD-10-CM

## 2020-11-03 DIAGNOSIS — M79604 Pain in right leg: Secondary | ICD-10-CM

## 2020-11-03 NOTE — Progress Notes (Signed)
[facility]  PATIENT: Toni Hill DOB: [birth year]  REFERRING CLINICIAN: Maudie Mercury, MD HISTORY FROM: PATIENT  REASON FOR VISIT: NEW CONSULT    HISTORICAL  CHIEF COMPLAINT:  Chief Complaint  Patient presents with  . New Patient (Initial Visit)    Rm 6 with EMS transport- Reports she is here to discuss onset of right leg weakness that started 5 months ago. Pt sts prior to 5 months ago she was able to ambulate. Sts now she cannot walk, transported via EMS stretcher today. She reports she has fallen a lot and is not sure of the meds she takes    HISTORY OF PRESENT ILLNESS:   75 year old female here for evaluation of right leg pain and spasms.  Patient has long history of diabetes, diabetic neuropathy, B12 deficiency, hypertension, hypothyroidism, depression, fibromyalgia.  Patient had remote right ankle fracture in 2010 requiring open reduction internal fixation.  In 2021 she was having increasing right ankle pain, possibly related to hardware.  In September 2021 she had hardware removal surgery.  Patient continued to have problems with right foot spasm, pain, rating from her right hip down to her right toes.  Ultimately she was admitted to the hospital in December 2021 for evaluation of pain.  She had MRI of the brain and lumbar spine, CT of the head and pelvis, no specific causes were found.  Possibility of conversion reaction was raised.  Since that time patient is living with her sister.  She is continuing to have severe pain and spasms in her right lower extremity from her hip down to her toes.  She is using Lyrica and tizanidine without relief.   REVIEW OF SYSTEMS: Full 14 system review of systems performed and negative with exception of: as per HPI.  ALLERGIES: Allergies  Allergen Reactions  . 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 Strongly recommend against using this antibiotic in the  future.   . Cephalexin Itching  . Gabapentin     Kidney issue  . Morphine And Related Nausea And Vomiting  . Vioxx [Rofecoxib]     GI bleeding  . Penicillins Hives and Itching    Has patient had a PCN reaction causing immediate rash, facial/tongue/throat swelling, SOB or lightheadedness with hypotension: No Has patient had a PCN reaction causing severe rash involving mucus membranes or skin necrosis: No Has patient had a PCN reaction that required hospitalization: no Has patient had a PCN reaction occurring within the last 10 years: No If all of the above answers are "NO", then may proceed with Cephalosporin use.     HOME MEDICATIONS: Outpatient Medications Prior to Visit  Medication Sig Dispense Refill  . acetaminophen (TYLENOL) 500 MG tablet Take 1,000 mg by mouth in the morning and at bedtime.    Marland Kitchen amLODipine (NORVASC) 5 MG tablet Take 5 mg by mouth daily.    . APIXABAN (ELIQUIS) VTE STARTER PACK (10MG AND 5MG) Take 5 mg by mouth in the morning and at bedtime.    Marland Kitchen atorvastatin (LIPITOR) 20 MG tablet Take 1 tablet (20 mg total) by mouth daily. 30 tablet 2  . Blood Glucose Monitoring Suppl (ONETOUCH VERIO REFLECT) w/Device KIT Check blood sugar 3 times a day 1 kit 0  . Calcium Carb-Cholecalciferol (CALCIUM 600/VITAMIN D3) 600-800 MG-UNIT TABS Take 1 tablet by mouth daily.     . Cholecalciferol 50 MCG (2000 UT) CAPS Take 2,000 Units by mouth daily.     Marland Kitchen  ezetimibe (ZETIA) 10 MG tablet TAKE 1 TABLET BY MOUTH EVERY DAY IN THE EVENING 90 tablet 1  . glucose blood (ONETOUCH VERIO) test strip Check blood sugar 3 times a day 300 each 3  . insulin detemir (LEVEMIR FLEXTOUCH) 100 UNIT/ML FlexPen Inject 15 Units into the skin daily. 15 mL 1  . Insulin Pen Needle (B-D ULTRAFINE III SHORT PEN) 31G X 8 MM MISC 1 each by Does not apply route daily. Diagnosis code: E11.9. Used to inject insulin daily. 100 each 2  . levothyroxine (SYNTHROID) 137 MCG tablet Take 1 tablet (137 mcg total) by mouth daily  before breakfast. (Patient taking differently: Take 137 mcg by mouth daily before breakfast. Brand name only) 90 tablet 3  . lidocaine (LIDODERM) 5 % PLACE 1 PATCH ONTO THE SKIN DAILY. REMOVE & DISCARD PATCH WITHIN 12 HOURS OR AS DIRECTED BY MD 30 patch 0  . NOVOLOG FLEXPEN 100 UNIT/ML FlexPen Inject 2-6 Units into the skin 3 (three) times daily with meals. Sliding scale    . ondansetron (ZOFRAN) 4 MG tablet Take 1 tablet (4 mg total) by mouth 2 (two) times daily. 180 tablet 0  . OneTouch Delica Lancets 60Y MISC Check blood sugar 3 times a day 300 each 3  . pantoprazole (PROTONIX) 40 MG tablet TAKE 1 TABLET BY MOUTH EVERY DAY 90 tablet 1  . polyethylene glycol (MIRALAX) 17 g packet Take 17 g by mouth daily. 14 each 0  . pregabalin (LYRICA) 50 MG capsule Take 1 capsule (50 mg total) by mouth 3 (three) times daily. 90 capsule 1  . senna-docusate (SENOKOT-S) 8.6-50 MG tablet Take 1 tablet by mouth daily. 90 tablet 2  . sertraline (ZOLOFT) 100 MG tablet Take 2 tablets (200 mg total) by mouth daily. 60 tablet 2  . tiZANidine (ZANAFLEX) 4 MG tablet Take 1 tablet (4 mg total) by mouth in the morning and at bedtime. 60 tablet 2  . vitamin B-12 (CYANOCOBALAMIN) 500 MCG tablet Take 1,000 mcg by mouth in the morning and at bedtime.      No facility-administered medications prior to visit.    PAST MEDICAL HISTORY: Past Medical History:  Diagnosis Date  . Bilateral lower extremity edema    right > left  . Bipolar 1 disorder ([place])   . CKD (chronic kidney disease) stage 3, GFR 30-59 ml/min (HCC)     Dr Lawson Radar, [facility]  . Diabetes, polyneuropathy ([place])    FEET AND FINGER TIPS  . Fibromyalgia   . GERD (gastroesophageal reflux disease)   . History of chronic gastritis   . History of COVID-24 Aug 2019  . Hyperlipidemia   . Hypertension    per pt was take off bp medication because of kidney disease told by her nephrologist  . Hypothyroidism   . Mixed incontinence urge and stress   .  OA (osteoarthritis)    KNEES  . OAB (overactive bladder)   . PONV (postoperative nausea and vomiting)   . 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 Type 2 diabetes mellitus treated with insulin ([place])   . Wears glasses     PAST SURGICAL HISTORY: Past Surgical History:  Procedure Laterality Date  . CHOLECYSTECTOMY OPEN  1990's  . COLONOSCOPY  last one 07-17-2014  . CYSTOSCOPY WITH INJECTION N/A 10/29/2016   Procedure: CYSTOSCOPY WITH INJECTION BOTOX 100 UNITS, urethral dilation;  Surgeon: Carolan Clines, MD;  Location: [facility];  Service: Urology;  Laterality: N/A;  .  ESOPHAGOGASTRODUODENOSCOPY  last one 07-01-2016  . EXCISIONAL BREAST BX  1990   BENIGN  . HARDWARE REMOVAL Right 03/05/2020   Procedure: HARDWARE REMOVAL RIGHT ANKLE;  Surgeon: Leandrew Koyanagi, MD;  Location: [facility];  Service: Orthopedics;  Laterality: Right;  . ORIF TIBIA & FIBULA FRACTURES  2010   retained rod  . TONSILLECTOMY  age 70  . TOTAL KNEE ARTHROPLASTY Left 01/15/2019   Procedure: LEFT TOTAL KNEE ARTHROPLASTY;  Surgeon: Leandrew Koyanagi, MD;  Location: [facility];  Service: Orthopedics;  Laterality: Left;  Marland Kitchen VAGINAL HYSTERECTOMY  1979  . VENTRAL HERNIA REPAIR  1990's    FAMILY HISTORY: Family History  Problem Relation Age of Onset  . Breast cancer Mother 81  . Cancer Father        ? type  . Breast cancer Sister 65  . Breast cancer Maternal Grandmother        ? age    SOCIAL HISTORY: Social History   Socioeconomic History  . Marital status: Married    Spouse name: Not on file  . Number of children: Not on file  . Years of education: Not on file  . Highest education level: Not on file  Occupational History  . Not on file  Tobacco Use  . Smoking status: Never Smoker  . Smokeless tobacco: Never Used  Vaping Use  . Vaping Use: Never used  Substance and Sexual Activity  . Alcohol use: No  . Drug use: No  . Sexual activity: Not Currently  Other Topics Concern   . Not on file  Social History Narrative  . Not on file   Social Determinants of Health   Financial Resource Strain: Not on file  Food Insecurity: Not on file  Transportation Needs: Not on file  Physical Activity: Not on file  Stress: Not on file  Social Connections: Not on file  Intimate Partner Violence: Not on file     PHYSICAL EXAM  GENERAL EXAM/CONSTITUTIONAL: Vitals:  Vitals:   11/03/20 1105  BP: (!) 141/74  Pulse: 86     There is no height or weight on file to calculate BMI. Wt Readings from Last 3 Encounters:  06/14/20 170 lb (77.1 kg)  03/20/20 170 lb 9.6 oz (77.4 kg)  03/05/20 174 lb 13.2 oz (79.3 kg)     Patient is in no distress; well developed, nourished and groomed; neck is supple  CARDIOVASCULAR:  Examination of carotid arteries is normal; no carotid bruits  Regular rate and rhythm, no murmurs  Examination of peripheral vascular system by observation and palpation is normal  EYES:  Ophthalmoscopic exam of optic discs and posterior segments is normal; no papilledema or hemorrhages  No exam data present  MUSCULOSKELETAL:  Gait, strength, tone, movements noted in Neurologic exam below  NEUROLOGIC: MENTAL STATUS:  No flowsheet data found.  awake, alert, oriented to person, place and time  recent and remote memory intact  normal attention and concentration  language fluent, comprehension intact, naming intact  fund of knowledge appropriate  CRANIAL NERVE:   2nd - no papilledema on fundoscopic exam  2nd, 3rd, 4th, 6th - pupils equal and reactive to light, visual fields full to confrontation, extraocular muscles intact, no nystagmus  5th - facial sensation symmetric  7th - facial strength symmetric  8th - hearing intact  9th - palate elevates symmetrically, uvula midline  11th - shoulder shrug symmetric  12th - tongue protrusion midline  MOTOR:   normal bulk and tone, full  strength in the BUE, LLE  RLE INCREASE TONE  (EXTENSOR SPASM; RIGHT FOOT PLANTAR FLEXED , TOES FLEXED, INVERTED); NOT ABLE TO MOVE HER RIGHT LEG; SIGNIFICANT PAIN WITH I CAREFULLY RAISE OR MOVE HER RIGHT LEG / FOOT  SENSORY:   normal and symmetric to light touch, temperature, vibration  COORDINATION:   finger-nose-finger, fine finger movements normal  REFLEXES:   deep tendon reflexes TRACE and symmetric  GAIT/STATION:   IN TRANSPORT STRETCHER       DIAGNOSTIC DATA (LABS, IMAGING, TESTING) - I reviewed patient records, labs, notes, testing and imaging myself where available.  Lab Results  Component Value Date   WBC 5.5 06/15/2020   HGB 10.9 (L) 06/15/2020   HCT 33.2 (L) 06/15/2020   MCV 98.2 06/15/2020   PLT 296 06/15/2020      Component Value Date/Time   NA 144 09/05/2020 1135   K 4.6 09/05/2020 1135   CL 102 09/05/2020 1135   CO2 25 09/05/2020 1135   GLUCOSE 219 (H) 09/05/2020 1135   GLUCOSE 183 (H) 06/15/2020 0319   BUN 27 09/05/2020 1135   CREATININE 1.27 (H) 09/05/2020 1135   CALCIUM 9.7 09/05/2020 1135   PROT 5.6 (L) 01/02/2020 0845   ALBUMIN 3.1 (L) 01/02/2020 0845   AST 24 01/02/2020 0845   ALT 16 01/02/2020 0845   ALKPHOS 55 01/02/2020 0845   BILITOT 0.9 01/02/2020 0845   GFRNONAA 40 (L) 06/15/2020 0319   GFRAA 50 (L) 06/05/2020 1652   No results found for: CHOL, HDL, LDLCALC, LDLDIRECT, TRIG, CHOLHDL Lab Results  Component Value Date   HGBA1C 8.1 (A) 09/05/2020   Lab Results  Component Value Date   [id number] 664 06/11/2020   Lab Results  Component Value Date   TSH 10.700 (H) 09/05/2020    05/11/20 right ankle xray - Status post ORIF with healed fracture deformity of the distal tibia and fibula. No acute osseous abnormality.   06/11/2020 MRI brain (with and without) [I reviewed images myself and agree with interpretation. -VRP]  - No acute intracranial abnormality or mass.   06/11/20 MRI lumbar spine 1. Multilevel degenerative disease without progression from 2020. Instead, there  has been regression of a L4-5 right-sided disc extrusion since prior. 2. L5-S1 bilateral subarticular recess stenosis which could affect either S1 nerve root, chronic. 3. L4-5 mild to moderate right foraminal narrowing. 4. Edematous appearance of the partially covered left piriformis, of uncertain importance given contralateral symptoms.    ASSESSMENT AND PLAN  75 y.o. year old female here with:  Dx:  1. Spasticity   2. Right leg pain      PLAN:  RIGHT LEG SPASM / PAIN (since Aug 2021; h/o right tib/fib fx and ORIF in 2010; hardware removal in Sept 2022) - unclear etiology; pain could be related to prior fracture, hardware complication, complex regional pain syndrome, and diabetic lumbosacral plexopathy; resultant involuntary spasms may be due to uncontrolled pain; weakness is related to severe pain; some psychogenic overlay is possible, but not likely explaining the full spectrum of patient's symptoms - refer to [facility] for pain mgmt and consideration of botox for spasticity  Orders Placed This Encounter  Procedures  . Ambulatory referral to Physical Medicine Rehab   Return for pending if symptoms worsen or fail to improve, return to PCP.    Penni Bombard, MD 4/0/3474, 25:95 AM Certified in Neurology, Neurophysiology and Neuroimaging  [facility] [address], [place], [place] [address] [phone]

## 2020-11-10 ENCOUNTER — Telehealth: Payer: Self-pay | Admitting: Internal Medicine

## 2020-11-10 NOTE — Telephone Encounter (Signed)
RTC, patient crying on phone, states she was supposed to have MRI on her right leg ordered from [facility] resident and this was never ordered.  Per chart review of LOV's, RN does not see where this was discussed.  Informed patient that per Dr. Sammie Bench last office note, patient needs to come in for an in-person visit.  Patient continues to cry and refuses to come in.  She is asking to talk to MD over the phone again at telehealth appt.  It is also noted that patient had an appt w/ Neuro on 11/03/20 regarding right leg pain and a referral was placed by Neuro to Physical Med Rehab which the patient states she cannot go to this appt. Telehealth appt made for 11/17/20 w/ Dr. Allyson Sabal, patient made aware that he may recommend in-person appt after discussion with her.  She was instructed to call 911 and present to ED if needed for right leg pain. SChaplin, RN,BSN

## 2020-11-10 NOTE — Telephone Encounter (Signed)
Pls call pt back about having having a New MRI. Pt states she can no longer move her Leg.  The patient has been offered appointments but states she is not coming in because she can't move and comes by Ambulance only.

## 2020-11-13 ENCOUNTER — Telehealth: Payer: Self-pay | Admitting: *Deleted

## 2020-11-13 NOTE — Telephone Encounter (Signed)
Call from [facility] with [facility] who stated pt continues to c/o a lot of pain esp 1-2 days after PT then back to "[facility]". So PT is placed on hold.

## 2020-11-14 ENCOUNTER — Other Ambulatory Visit: Payer: Self-pay | Admitting: Internal Medicine

## 2020-11-14 DIAGNOSIS — F444 Conversion disorder with motor symptom or deficit: Secondary | ICD-10-CM

## 2020-11-15 ENCOUNTER — Other Ambulatory Visit: Payer: Self-pay | Admitting: Internal Medicine

## 2020-11-17 ENCOUNTER — Encounter: Payer: Medicare Other | Admitting: Student

## 2020-11-19 ENCOUNTER — Other Ambulatory Visit: Payer: Self-pay

## 2020-11-19 ENCOUNTER — Ambulatory Visit (INDEPENDENT_AMBULATORY_CARE_PROVIDER_SITE_OTHER): Payer: Medicare Other | Admitting: Student

## 2020-11-19 DIAGNOSIS — R29898 Other symptoms and signs involving the musculoskeletal system: Secondary | ICD-10-CM

## 2020-11-19 DIAGNOSIS — F444 Conversion disorder with motor symptom or deficit: Secondary | ICD-10-CM

## 2020-11-19 DIAGNOSIS — E1140 Type 2 diabetes mellitus with diabetic neuropathy, unspecified: Secondary | ICD-10-CM | POA: Diagnosis not present

## 2020-11-19 DIAGNOSIS — M5440 Lumbago with sciatica, unspecified side: Secondary | ICD-10-CM

## 2020-11-19 DIAGNOSIS — Z794 Long term (current) use of insulin: Secondary | ICD-10-CM

## 2020-11-19 NOTE — Assessment & Plan Note (Signed)
She reports no new changes in her right leg pain and weakness, but reports that there has been no improvement.  She reports that she saw a neurologist a couple weeks ago as well but no notable changes were made.  Clinic note from 09/05/2020 details extensive work-up including imaging of her brain/spinal cord and an inpatient neurology consult without any notable findings or identifiable cause of her symptoms.  She was started on Lyrica and Flexeril for her pain.  She was later transitioned from Flexeril to tizanidine, which she states has been slightly helpful in improving her symptoms.  She saw [facility] earlier this month who noted that she has increased tone in her right lower extremity and is unable to move her right leg without significant pain.  Neurology evaluation did not result in an identifiable cause of her pain and spasms but neurology advised that her pain could be related to hardware complication, complex regional pain syndrome, or diabetic lumbosacral plexopathy.  Explained to the patient each of these diagnoses and discussed that the cause of her pain would not be completely managed with medications alone.  She understands this.    Neurology recommended referral to physical medicine and rehabilitation for further management, but patient was unable to make it to the appointment.  Discussed that it would be very beneficial for her to see PM&R as physical therapy will be very important along with medication management in controlling her pain and muscle spasms.  She agrees with referral to PM&R.  Also discussed referral to chronic care management for continued long-term management of her medical conditions.  She agrees with this as well.  Plan: -continue lyrica and tizanidine -referral to PM&R for further evaluation and management

## 2020-11-19 NOTE — Progress Notes (Signed)
[facility] Telephone Encounter Continuity Care Appointment  HPI:   This telephone encounter was created for [name] on 11/19/2020 for the following purpose/cc: right leg pain and weakness.  Spoke with patient via telephone after verifying identity with DOB and address. She is complaining of chronic right leg pain and weakness.  Explained that an in person visit would be more helpful in terms of evaluating her right leg, however there was an issue with her transportation service this morning and thus had to change to telehealth visit.  She reports no new changes in her right leg pain and weakness, but reports that there has been no improvement.  She reports that she saw a neurologist a couple weeks ago as well but no notable changes were made.  Clinic note from 09/05/2020 details extensive work-up including imaging of her brain/spinal cord and an inpatient neurology consult without any notable findings or identifiable cause of her symptoms.  She was started on Zoloft 150 mg daily along with Lyrica and Flexeril for her pain.  She was later transition from Flexeril to tizanidine, which she states has been slightly helpful in improving her symptoms.  She saw [facility] earlier this month who noted that she has increased tone in her right lower extremity and is unable to move her right leg without significant pain.  Neurology evaluation did not result in an identifiable cause of her pain and spasms but neurology advised that her pain could be related to hardware complication, complex regional pain syndrome, or diabetic lumbosacral plexopathy.  Explained to the patient each of these diagnoses and discussed that the cause of her pain would not be completely managed with medications alone.  She understands this.    Neurology recommended referral to physical medicine and rehabilitation for further management, but patient was unable to make it to the  appointment.  Discussed that it would be very beneficial for her to see PM&R as physical therapy will be very important along with medication management in controlling her pain and muscle spasms.  She agrees with referral to PM&R.  Also discussed referral to chronic care management for continued long-term management of her medical conditions.  She agrees with this as well.   Past Medical History:  Past Medical History:  Diagnosis Date  . Bilateral lower extremity edema    right > left  . Bipolar 1 disorder ([place])   . CKD (chronic kidney disease) stage 3, GFR 30-59 ml/min (HCC)     Dr Lawson Radar, [facility]  . Diabetes, polyneuropathy ([place])    FEET AND FINGER TIPS  . Fibromyalgia   . GERD (gastroesophageal reflux disease)   . History of chronic gastritis   . History of COVID-24 Aug 2019  . Hyperlipidemia   . Hypertension    per pt was take off bp medication because of kidney disease told by her nephrologist  . Hypothyroidism   . Mixed incontinence urge and stress   . OA (osteoarthritis)    KNEES  . OAB (overactive bladder)   . PONV (postoperative nausea and vomiting)   . 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 Type 2 diabetes mellitus treated with insulin ([place])   . Wears glasses       ROS:   Negative ROS aside from that listed in HPI.   Assessment / Plan / Recommendations:   Please see A&P under problem oriented charting for assessment of the patient's acute  and chronic medical conditions.   As always, pt is advised that if symptoms worsen or new symptoms arise, they should go to an urgent care facility or to to ER for further evaluation.   Consent and Medical Decision Making:   Patient discussed with Dr. Evette Doffing  This is a telephone encounter between Toni Hill and Virl Axe on 11/19/2020 for chronic right leg pain and weakness. The visit was conducted with the patient located at home and Virl Axe at [facility]. The patient's identity was confirmed  using their DOB and current address. The patient has consented to being evaluated through a telephone encounter and understands the associated risks (an examination cannot be done and the patient may need to come in for an appointment) / benefits (allows the patient to remain at home, decreasing exposure to coronavirus). I personally spent 24 minutes on medical discussion.

## 2020-11-20 ENCOUNTER — Ambulatory Visit: Payer: Medicare Other | Admitting: Behavioral Health

## 2020-11-20 ENCOUNTER — Encounter: Payer: Self-pay | Admitting: *Deleted

## 2020-11-20 ENCOUNTER — Other Ambulatory Visit: Payer: Self-pay

## 2020-11-20 DIAGNOSIS — F419 Anxiety disorder, unspecified: Secondary | ICD-10-CM

## 2020-11-20 DIAGNOSIS — F331 Major depressive disorder, recurrent, moderate: Secondary | ICD-10-CM

## 2020-11-20 NOTE — BH Specialist Note (Signed)
Integrated Behavioral Health via Telemedicine Visit  11/20/2020 Toni Hill [id number]  Number of [place] visits: 1/6 Session Start time: 2:00pm  Session End time: 2:30pm Total time: 30  Referring Provider: Dr. Maudie Mercury, MD Patient/Family location: Pt @ home w/[name] helping her on speaker phone [facility] Provider location: [facility] Office All persons participating in visit: Pt, her 75yo Str Toni Hill, & Clinician Types of Service: Collaborative care, Health Promotion and Introduction only  I connected with Toni Hill and/or Toni Hill's Str Toni Hill via  Telephone or [employer]  (Video is Caregility application) and verified that I am speaking with the correct person using two identifiers. Discussed confidentiality: Yes   I discussed the limitations of telemedicine and the availability of in person appointments.  Discussed there is a possibility of technology failure and discussed alternative modes of communication if that failure occurs.  I discussed that engaging in this telemedicine visit, they consent to the provision of behavioral healthcare and the services will be billed under their insurance.  Patient and/or legal guardian expressed understanding and consented to Telemedicine visit: Yes   Presenting Concerns: Patient and/or family reports the following symptoms/concerns: elevated levels of pain in R foot where hardware has recently been removed Duration of problem: one week-acute; Severity of problem: moderate  & causing sleep disruption  Patient and/or Family's Strengths/Protective Factors: Social connections, Social and Emotional competence, Concrete supports in place (healthy food, safe environments, etc.) and Physical Health (exercise, healthy diet, medication compliance, etc.)  Goals Addressed: Patient will: 1.  Reduce symptoms of: anxiety, depression and pain  2.  Increase knowledge and/or ability of:  coping skills, stress reduction and pain reduction techniques  3.  Demonstrate ability to: Increase healthy adjustment to current life circumstances and Increase adequate support systems for patient/family  Progress towards Goals: Estb'd today: Pt will meet every 3 wks w/Clinician for check-ins  Interventions: Interventions utilized:  Mindfulness or Relaxation Training, Behavioral Activation and Supportive Counseling Standardized Assessments completed: Not Needed - use prn   Patient and/or Family Response: Pt receptive to call & requests future appts  Assessment: Patient currently experiencing elevated levels of foot pain in her R foot. Pt sleep has been disrupted by these pain issues; she iw awakened by upper leg spasms & spasms in her R foot. Her [place] tells her she needs a new prescription written to cover the Lyrica for 100mg  BID.  Patient may benefit from Physician orders to medically justify transportation since she cannot sit up right. Pt expedited fax from [place] to [facility] Office for this reason. Given to Dr. Maudie Mercury, MD  Plan: 1. Follow up with behavioral health clinician on : 3 wks from now on telehealth for 30 min chk-in 2. Behavioral recommendations: Call [facility] for resource needs. We will fax the form back to them for your appt once scheduled @ [facility] 3. Referral(s): [place] (In Clinic) and [employer]  I discussed the assessment and treatment plan with the patient and/or parent/guardian. They were provided an opportunity to ask questions and all were answered. They agreed with the plan and demonstrated an understanding of the instructions.   They were advised to call back or seek an in-person evaluation if the symptoms worsen or if the condition fails to improve as anticipated.  Toni Hutching, LMFT

## 2020-11-20 NOTE — Addendum Note (Signed)
Addended by: Lalla Brothers T on: 11/20/2020 08:46 AM   Modules accepted: Level of Service

## 2020-11-20 NOTE — Progress Notes (Signed)
Internal Medicine Clinic Attending  Case discussed with Dr. Jinwala  At the time of the visit.  We reviewed the resident's history and exam and pertinent patient test results.  I agree with the assessment, diagnosis, and plan of care documented in the resident's note.  

## 2020-11-20 NOTE — Progress Notes (Signed)

## 2020-11-21 ENCOUNTER — Emergency Department (HOSPITAL_COMMUNITY)
Admission: EM | Admit: 2020-11-21 | Discharge: 2020-11-22 | Disposition: A | Discharge status: Home / Self Care | Payer: Medicare Other | Attending: Emergency Medicine | Admitting: Emergency Medicine

## 2020-11-21 ENCOUNTER — Other Ambulatory Visit: Payer: Self-pay

## 2020-11-21 ENCOUNTER — Encounter: Payer: Medicare Other | Admitting: Student

## 2020-11-21 ENCOUNTER — Emergency Department (HOSPITAL_COMMUNITY): Payer: Medicare Other

## 2020-11-21 DIAGNOSIS — N183 Chronic kidney disease, stage 3 unspecified: Secondary | ICD-10-CM | POA: Insufficient documentation

## 2020-11-21 DIAGNOSIS — R630 Anorexia: Secondary | ICD-10-CM | POA: Insufficient documentation

## 2020-11-21 DIAGNOSIS — E1140 Type 2 diabetes mellitus with diabetic neuropathy, unspecified: Secondary | ICD-10-CM | POA: Insufficient documentation

## 2020-11-21 DIAGNOSIS — Z8616 Personal history of COVID-19: Secondary | ICD-10-CM | POA: Diagnosis not present

## 2020-11-21 DIAGNOSIS — I129 Hypertensive chronic kidney disease with stage 1 through stage 4 chronic kidney disease, or unspecified chronic kidney disease: Secondary | ICD-10-CM | POA: Diagnosis not present

## 2020-11-21 DIAGNOSIS — R1013 Epigastric pain: Secondary | ICD-10-CM | POA: Insufficient documentation

## 2020-11-21 DIAGNOSIS — Z8719 Personal history of other diseases of the digestive system: Secondary | ICD-10-CM | POA: Insufficient documentation

## 2020-11-21 DIAGNOSIS — R1030 Lower abdominal pain, unspecified: Secondary | ICD-10-CM | POA: Diagnosis not present

## 2020-11-21 DIAGNOSIS — Z79899 Other long term (current) drug therapy: Secondary | ICD-10-CM | POA: Insufficient documentation

## 2020-11-21 DIAGNOSIS — E039 Hypothyroidism, unspecified: Secondary | ICD-10-CM | POA: Insufficient documentation

## 2020-11-21 DIAGNOSIS — E1122 Type 2 diabetes mellitus with diabetic chronic kidney disease: Secondary | ICD-10-CM | POA: Insufficient documentation

## 2020-11-21 DIAGNOSIS — R350 Frequency of micturition: Secondary | ICD-10-CM | POA: Diagnosis not present

## 2020-11-21 DIAGNOSIS — Z96652 Presence of left artificial knee joint: Secondary | ICD-10-CM | POA: Insufficient documentation

## 2020-11-21 DIAGNOSIS — R109 Unspecified abdominal pain: Secondary | ICD-10-CM

## 2020-11-21 LAB — CBC WITH DIFFERENTIAL/PLATELET
Abs Immature Granulocytes: 0.02 10*3/uL (ref 0.00–0.07)
Basophils Absolute: 0 10*3/uL (ref 0.0–0.1)
Basophils Relative: 0 %
Eosinophils Absolute: 0 10*3/uL (ref 0.0–0.5)
Eosinophils Relative: 0 %
HCT: 40 % (ref 36.0–46.0)
Hemoglobin: 12.9 g/dL (ref 12.0–15.0)
Immature Granulocytes: 0 %
Lymphocytes Relative: 32 %
Lymphs Abs: 2 10*3/uL (ref 0.7–4.0)
MCH: 31.5 pg (ref 26.0–34.0)
MCHC: 32.3 g/dL (ref 30.0–36.0)
MCV: 97.8 fL (ref 80.0–100.0)
Monocytes Absolute: 0.6 10*3/uL (ref 0.1–1.0)
Monocytes Relative: 9 %
Neutro Abs: 3.6 10*3/uL (ref 1.7–7.7)
Neutrophils Relative %: 59 %
Platelets: 228 10*3/uL (ref 150–400)
RBC: 4.09 MIL/uL (ref 3.87–5.11)
RDW: 12.4 % (ref 11.5–15.5)
WBC: 6.3 10*3/uL (ref 4.0–10.5)
nRBC: 0 % (ref 0.0–0.2)

## 2020-11-21 LAB — COMPREHENSIVE METABOLIC PANEL
ALT: 14 U/L (ref 0–44)
AST: 17 U/L (ref 15–41)
Albumin: 3.3 g/dL — ABNORMAL LOW (ref 3.5–5.0)
Alkaline Phosphatase: 51 U/L (ref 38–126)
Anion gap: 6 (ref 5–15)
BUN: 31 mg/dL — ABNORMAL HIGH (ref 8–23)
CO2: 29 mmol/L (ref 22–32)
Calcium: 9.5 mg/dL (ref 8.9–10.3)
Chloride: 105 mmol/L (ref 98–111)
Creatinine, Ser: 1.08 mg/dL — ABNORMAL HIGH (ref 0.44–1.00)
GFR, Estimated: 54 mL/min — ABNORMAL LOW (ref >60)
Glucose, Bld: 184 mg/dL — ABNORMAL HIGH (ref 70–99)
Potassium: 4.3 mmol/L (ref 3.5–5.1)
Sodium: 140 mmol/L (ref 135–145)
Total Bilirubin: 0.3 mg/dL (ref 0.3–1.2)
Total Protein: 5.9 g/dL — ABNORMAL LOW (ref 6.5–8.1)

## 2020-11-21 LAB — URINALYSIS, ROUTINE W REFLEX MICROSCOPIC
Bilirubin Urine: NEGATIVE
Glucose, UA: NEGATIVE mg/dL
Ketones, ur: NEGATIVE mg/dL
Leukocytes,Ua: NEGATIVE
Nitrite: NEGATIVE
Protein, ur: NEGATIVE mg/dL
Specific Gravity, Urine: 1.012 (ref 1.005–1.030)
pH: 6 (ref 5.0–8.0)

## 2020-11-21 LAB — CBG MONITORING, ED: Glucose-Capillary: 134 mg/dL — ABNORMAL HIGH (ref 70–99)

## 2020-11-21 LAB — LIPASE, BLOOD: Lipase: 31 U/L (ref 11–51)

## 2020-11-21 MED ORDER — FENTANYL CITRATE (PF) 100 MCG/2ML IJ SOLN
50.0000 ug | Freq: Once | INTRAMUSCULAR | Status: AC
Start: 2020-11-21 — End: 2020-11-21
  Administered 2020-11-21: 50 ug via INTRAVENOUS
  Filled 2020-11-21: qty 2

## 2020-11-21 NOTE — Discharge Instructions (Signed)
Follow-up with your primary care doctor for further management of the abdominal pain.

## 2020-11-21 NOTE — ED Provider Notes (Signed)
[facility] EMERGENCY DEPARTMENT Provider Note   CSN: [id number] Arrival date & time: 11/21/20  1458     History Chief Complaint  Patient presents with  . Abdominal Pain    Toni Hill is a 75 y.o. female presenting for evaluation of abd pain.   Pt states she has been having abd pain for at least a few days (unblet o state when exactly it started).  She states initially her pain was more epigastric, but now is more towards the lower abdomen.  She describes it as a pressure, which is constant.  She reports associated urinary frequency.  No dysuria or hematuria.  She denies fevers, chills, nausea, vomiting.  She reports larger and harder stool balls recently, but notes significant constipation.  She reports pain worsens after eating or drinking, and she has a decreased appetite.   Additional history obtained from chart review.  History of bipolar, CKD, diabetes, fibromyalgia, GERD,, gastritis, hypertension, hyperlipidemia, renal cysts  HPI     Past Medical History:  Diagnosis Date  . Bilateral lower extremity edema    right > left  . Bipolar 1 disorder ([place])   . CKD (chronic kidney disease) stage 3, GFR 30-59 ml/min (HCC)     Dr Lawson Radar, [facility]  . Diabetes, polyneuropathy ([place])    FEET AND FINGER TIPS  . Fibromyalgia   . GERD (gastroesophageal reflux disease)   . History of chronic gastritis   . History of COVID-24 Aug 2019  . Hyperlipidemia   . Hypertension    per pt was take off bp medication because of kidney disease told by her nephrologist  . Hypothyroidism   . Mixed incontinence urge and stress   . OA (osteoarthritis)    KNEES  . OAB (overactive bladder)   . PONV (postoperative nausea and vomiting)   . 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 Type 2 diabetes mellitus treated with insulin ([place])   . Wears glasses     Patient Active Problem List   Diagnosis Date Noted  . Right leg weakness 06/12/2020  . 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 Constipation 06/06/2020  . Deep vein thrombosis (DVT) of proximal vein of right lower extremity ([place]) 05/26/2020  . Protein-calorie malnutrition, severe 01/04/2020  . Intractable nausea and vomiting 01/02/2020  . Nausea & vomiting 01/01/2020  . Preoperative evaluation to rule out surgical contraindication 11/19/2019  . Bilateral low back pain with sciatica 09/06/2019  . Peripheral neuropathy 09/06/2019  . Seborrheic keratoses 08/20/2019  . Left breast mass 08/20/2019  . Macrocytic anemia 03/08/2019  . Leg swelling 01/25/2019  . QT prolongation 01/25/2019  . At risk for adverse drug event 01/22/2019  . Bipolar 1 disorder ([place])   . Osteoarthritis of left knee 12/30/2018  . Anxiety 12/30/2018  . Stage 3 chronic kidney disease 06/28/2016  . Hypercholesterolemia 08/20/2015  . Type 2 diabetes mellitus ([place]) 02/28/2014  . Esophageal reflux 02/28/2014  . Stress incontinence, female 02/28/2014  . Hypothyroidism (acquired) 02/28/2014  . Essential hypertension 11/17/2011    Past Surgical History:  Procedure Laterality Date  . CHOLECYSTECTOMY OPEN  1990's  . COLONOSCOPY  last one 07-17-2014  . CYSTOSCOPY WITH INJECTION N/A 10/29/2016   Procedure: CYSTOSCOPY WITH INJECTION BOTOX 100 UNITS, urethral dilation;  Surgeon: Carolan Clines, MD;  Location: [place];  Service: Urology;  Laterality: N/A;  . ESOPHAGOGASTRODUODENOSCOPY  last one 07-01-2016  . EXCISIONAL  BREAST BX  1990   BENIGN  . HARDWARE REMOVAL Right 03/05/2020   Procedure: HARDWARE REMOVAL RIGHT ANKLE;  Surgeon: Leandrew Koyanagi, MD;  Location: [place];  Service: Orthopedics;  Laterality: Right;  . ORIF TIBIA & FIBULA FRACTURES  2010   retained rod  . TONSILLECTOMY  age 35  . TOTAL KNEE ARTHROPLASTY Left 01/15/2019   Procedure: LEFT TOTAL KNEE ARTHROPLASTY;  Surgeon: Leandrew Koyanagi, MD;  Location: [place];  Service: Orthopedics;   Laterality: Left;  Marland Kitchen VAGINAL HYSTERECTOMY  1979  . VENTRAL HERNIA REPAIR  37's     OB History    Gravida  2   Para  2   Term      Preterm      AB      Living  2     SAB      IAB      Ectopic      Multiple      Live Births              Family History  Problem Relation Age of Onset  . Breast cancer Mother 1  . Cancer Father        ? type  . Breast cancer Sister 32  . Breast cancer Maternal Grandmother        ? age    Social History   Tobacco Use  . Smoking status: Never Smoker  . Smokeless tobacco: Never Used  Vaping Use  . Vaping Use: Never used  Substance Use Topics  . Alcohol use: No  . Drug use: No    Home Medications Prior to Admission medications   Medication Sig Start Date End Date Taking? Authorizing Provider  acetaminophen (TYLENOL) 500 MG tablet Take 1,000 mg by mouth in the morning and at bedtime.    [provider]  amLODipine (NORVASC) 5 MG tablet Take 5 mg by mouth daily. 05/03/19   [provider]  APIXABAN Arne Cleveland) VTE STARTER PACK (10MG AND 5MG) Take 5 mg by mouth in the morning and at bedtime. 05/23/20   [provider]  atorvastatin (LIPITOR) 20 MG tablet Take 1 tablet (20 mg total) by mouth daily. 10/02/20   Seawell, Jaimie A, DO  Blood Glucose Monitoring Suppl (ONETOUCH VERIO REFLECT) w/Device KIT Check blood sugar 3 times a day 03/14/20   Maudie Mercury, MD  Calcium Carb-Cholecalciferol (CALCIUM 600/VITAMIN D3) 600-800 MG-UNIT TABS Take 1 tablet by mouth daily.     [provider]  Cholecalciferol 50 MCG (2000 UT) CAPS Take 2,000 Units by mouth daily.     [provider]  ezetimibe (ZETIA) 10 MG tablet TAKE 1 TABLET BY MOUTH EVERY DAY IN THE EVENING 08/18/20   Cato Mulligan, MD  glucose blood [facility] VERIO) test strip Check blood sugar 3 times a day 03/14/20   Maudie Mercury, MD  insulin detemir (LEVEMIR FLEXTOUCH) 100 UNIT/ML FlexPen Inject 15 Units into the skin daily. 08/18/20    Cato Mulligan, MD  Insulin Pen Needle (B-D ULTRAFINE III SHORT PEN) 31G X 8 MM MISC 1 each by Does not apply route daily. Diagnosis code: E11.9. Used to inject insulin daily. 02/20/20   Asencion Noble, MD  levothyroxine (SYNTHROID) 137 MCG tablet Take 1 tablet (137 mcg total) by mouth daily before breakfast. Patient taking differently: Take 137 mcg by mouth daily before breakfast. Brand name only 09/16/20   Maudie Mercury, MD  lidocaine (LIDODERM) 5 % PLACE 1 PATCH ONTO THE SKIN DAILY.  REMOVE & DISCARD PATCH WITHIN 12 HOURS OR AS DIRECTED BY MD 10/27/20   Lacinda Axon, MD  NOVOLOG FLEXPEN 100 UNIT/ML FlexPen Inject 2-6 Units into the skin 3 (three) times daily with meals. Sliding scale 05/12/20   [provider]  ondansetron (ZOFRAN) 4 MG tablet Take 1 tablet (4 mg total) by mouth 2 (two) times daily. 09/05/20   Sid Falcon, MD  OneTouch Delica Lancets 79T MISC Check blood sugar 3 times a day 03/14/20   Maudie Mercury, MD  pantoprazole (PROTONIX) 40 MG tablet TAKE 1 TABLET BY MOUTH EVERY DAY 07/18/20   Lacinda Axon, MD  polyethylene glycol (MIRALAX) 17 g packet Take 17 g by mouth daily. 06/05/20   Asencion Noble, MD  pregabalin (LYRICA) 50 MG capsule Take 1 capsule (50 mg total) by mouth 3 (three) times daily. 10/27/20   Lacinda Axon, MD  senna-docusate (SENOKOT-S) 8.6-50 MG tablet Take 1 tablet by mouth daily. 09/08/20 12/07/20  Maudie Mercury, MD  sertraline (ZOLOFT) 100 MG tablet TAKE 2 TABLETS BY MOUTH EVERY DAY 11/15/20   Asencion Noble, MD  tiZANidine (ZANAFLEX) 4 MG tablet Take 1 tablet (4 mg total) by mouth in the morning and at bedtime. 10/23/20 01/21/21  Marianna Payment, MD  vitamin B-12 (CYANOCOBALAMIN) 500 MCG tablet Take 1,000 mcg by mouth in the morning and at bedtime.     [provider]    Allergies    Bactrim [sulfamethoxazole-trimethoprim], Cephalexin, Gabapentin, Morphine and related, Vioxx [rofecoxib], and Penicillins  Review of  Systems   Review of Systems  Constitutional: Positive for appetite change.  Gastrointestinal: Positive for abdominal pain.  Genitourinary: Positive for frequency.  All other systems reviewed and are negative.   Physical Exam Updated Vital Signs BP (!) 147/73 (BP Location: Right Arm)   Pulse 77   Temp 98 F (36.7 C) (Oral)   Resp 16   Ht 5' 10" (1.778 m)   Wt 68 kg   SpO2 98%   BMI 21.52 kg/m   Physical Exam Vitals and nursing note reviewed.  Constitutional:      General: She is not in acute distress.    Appearance: She is well-developed.     Comments: Resting in the bed in NAD  HENT:     Head: Normocephalic and atraumatic.  Eyes:     Conjunctiva/sclera: Conjunctivae normal.     Pupils: Pupils are equal, round, and reactive to light.  Cardiovascular:     Rate and Rhythm: Normal rate and regular rhythm.     Pulses: Normal pulses.  Pulmonary:     Effort: Pulmonary effort is normal. No respiratory distress.     Breath sounds: Normal breath sounds. No wheezing.  Abdominal:     General: There is no distension.     Palpations: Abdomen is soft. There is mass.     Tenderness: There is abdominal tenderness. There is no guarding or rebound.     Comments: Tenderness palpation of epigastric and suprapubic abdomen.  No rigidity, guarding or distention.  Negative rebound.  No peritonitis.  No CVA tenderness.  Small mass noted just above the umbilicus, which patient states has been there for years  Musculoskeletal:        General: Normal range of motion.     Cervical back: Normal range of motion and neck supple.  Skin:    General: Skin is warm and dry.     Capillary Refill: Capillary refill takes less than 2 seconds.  Neurological:  Mental Status: She is alert and oriented to person, place, and time.     ED Results / Procedures / Treatments   Labs (all labs ordered are listed, but only abnormal results are displayed) Labs Reviewed  CBC WITH DIFFERENTIAL/PLATELET   COMPREHENSIVE METABOLIC PANEL  LIPASE, BLOOD  URINALYSIS, ROUTINE W REFLEX MICROSCOPIC    EKG None  Radiology No results found.  Procedures Procedures   Medications Ordered in ED Medications - No data to display  ED Course  I have reviewed the triage vital signs and the nursing notes.  Pertinent labs & imaging results that were available during my care of the patient were reviewed by me and considered in my medical decision making (see chart for details).    MDM Rules/Calculators/A&P                          Patient presenting for evaluation of abdominal pain, pressure, and urinary frequency.  On exam, patient appears nontoxic.  She does report acute decreased appetite.  Eating and drinking makes her pain worse.  Consider gastritis versus GERD versus pancreatitis versus gastroenteritis versus obstruction.  Will obtain labs and CT abdomen pelvis for further evaluation.  CT negative for acute findings.  Labs interpreted by me, overall reassuring.  Urine is pending.  Patient signed out to Robyn Haber, MD for follow-up on urine and ultimate disposition.  Final Clinical Impression(s) / ED Diagnoses Final diagnoses:  None    Rx / DC Orders ED Discharge Orders    None       Franchot Heidelberg, PA-C 11/21/20 1840    Davonna Belling, MD 11/21/20 2236

## 2020-11-21 NOTE — ED Notes (Signed)
[facility] called for transport home. 

## 2020-11-21 NOTE — ED Triage Notes (Signed)
Pt arrives via [facility] from home. Urology told her to come. Pt states she has abdominal pain that increasing gets worse. Pain 8/10   128/68 95% RA  HR 75 CBG 159

## 2020-11-24 NOTE — Progress Notes (Signed)
Things That May Be Affecting Your Health:  Alcohol  Hearing loss  Pain    Depression  Home Safety  Sexual Health  x Diabetes  Lack of physical activity x Stress   Difficulty with daily activities  Loneliness  Tiredness   Drug use x Medicines  Tobacco use   Falls  Motor Vehicle Safety  Weight   Food choices  Oral Health  Other    YOUR PERSONALIZED HEALTH PLAN : 1. Schedule your next subsequent Medicare Wellness visit in one year 2. Attend all of your regular appointments to address your medical issues 3. Complete the preventative screenings and services   Annual Wellness Visit   Medicare Covered Preventative Screenings and [place] Men and Women Who How Often Need? Date of Last Service Action  Abdominal Aortic Aneurysm Adults with AAA risk factors Once      Alcohol Misuse and Counseling All Adults Screening once a year if no alcohol misuse. Counseling up to 4 face to face sessions.     Bone Density Measurement  Adults at risk for osteoporosis Once every 2 yrs x     Lipid Panel Z13.6 All adults without CV disease Once every 5 yrs       Colorectal Cancer   Stool sample or  Colonoscopy All adults 34 and older   Once every year  Every 10 years x       Depression All Adults Once a year  Today   Diabetes Screening Blood glucose, post glucose load, or GTT Z13.1  All adults at risk  Pre-diabetics  Once per year  Twice per year      Diabetes  Self-Management Training All adults Diabetics 10 hrs first year; 2 hours subsequent years. Requires Copay     Glaucoma  Diabetics  Family history of glaucoma  African Americans 36 yrs +  Hispanic Americans 59 yrs + Annually - requires coppay      Hepatitis C Z72.89 or F19.20  High Risk for HCV  Born between 1945 and 1965  Annually  Once      HIV Z11.4 All adults based on risk  Annually btw ages 33 & 20 regardless of risk  Annually > 65 yrs if at increased risk      Lung Cancer Screening  Asymptomatic adults aged 31-77 with 30 pack yr history and current smoker OR quit within the last 15 yrs Annually Must have counseling and shared decision making documentation before first screen      Medical Nutrition Therapy Adults with   Diabetes  Renal disease  Kidney transplant within past 3 yrs 3 hours first year; 2 hours subsequent years     Obesity and Counseling All adults Screening once a year Counseling if BMI 30 or higher  Today   Tobacco Use Counseling Adults who use tobacco  Up to 8 visits in one year     Vaccines Z23  Hepatitis B  Influenza   Pneumonia  Adults   Once  Once every flu season  Two different vaccines separated by one year     Next Annual Wellness Visit People with Medicare Every year  Today     Services & Screenings Women Who How Often Need  Date of Last Service Action  Mammogram  Z12.31 Women over 70 One baseline ages 49-39. Annually ager 40 yrs+ x     Pap tests All women Annually if high risk. Every 2 yrs for normal risk women  Screening for cervical cancer with   Pap (Z01.419 nl or Z01.411abnl) &  HPV Z11.51 Women aged 56 to 7 Once every 5 yrs     Screening pelvic and breast exams All women Annually if high risk. Every 2 yrs for normal risk women     Sexually Transmitted Diseases  Chlamydia  Gonorrhea  Syphilis All at risk adults Annually for non pregnant females at increased risk         [place] Men Who How Ofter Need  Date of Last Service Action  Prostate Cancer - DRE & PSA Men over 50 Annually.  DRE might require a copay.        Sexually Transmitted Diseases  Syphilis All at risk adults Annually for men at increased risk      Health Maintenance List Health Maintenance  Topic Date Due  . URINE MICROALBUMIN  Never done  . COVID-19 Vaccine (1) Never done  . Hepatitis C Screening  Never done  . TETANUS/TDAP  Never done  . COLONOSCOPY (Pts 45-36yrs Insurance coverage will need to be confirmed)   Never done  . MAMMOGRAM  08/15/2000  . DEXA SCAN  Never done  . FOOT EXAM  04/02/2020  . OPHTHALMOLOGY EXAM  08/16/2020  . INFLUENZA VACCINE  02/02/2021  . HEMOGLOBIN A1C  03/08/2021  . PNA vac Low Risk Adult  Completed  . HPV VACCINES  Aged Out

## 2020-11-25 ENCOUNTER — Telehealth: Payer: Self-pay

## 2020-11-25 NOTE — Telephone Encounter (Signed)
   Telephone encounter was:  Successful.  11/25/2020 Name: Toni Hill MRN: [id number] DOB: 08/07/1945  Toni Hill is a 75 y.o. year old female who is a primary care patient of Maudie Mercury, MD . The community resource team was consulted for assistance with Transportation Needs   Care guide performed the following interventions: Patient provided with information about care guide support team and interviewed to confirm resource needs Spoke with patient about [facility].  No further assistance is needed at this time.   .  Follow Up Plan:  No further follow up planned at this time. The patient has been provided with needed resources.  Leylah Tarnow, AAS Paralegal, [place] . Embedded Care Coordination [facility]  Care Management  [address], [place] [address] [email or web address]  ?? [phone]   [email or web address]

## 2020-11-26 ENCOUNTER — Telehealth: Payer: Self-pay

## 2020-11-26 NOTE — Telephone Encounter (Signed)
Requesting to speak with a nurse about having leg pain. Please call back.

## 2020-11-26 NOTE — Telephone Encounter (Signed)
Return pt's call. Stated she's c/o right leg pain and muscle relaxants are not helping. Requesting something else for pain. Stated when she went to the ED on 5/20, the doctor picked up her right leg by her big toe and dropped it and her leg has been in excrurciating pain  Ever since this happened. Thanks

## 2020-11-28 ENCOUNTER — Telehealth: Payer: Self-pay | Admitting: Internal Medicine

## 2020-11-28 NOTE — Telephone Encounter (Signed)
Pls contact pt regarding pain medicine [phone]

## 2020-11-28 NOTE — Telephone Encounter (Signed)
Thank you - I agree with that plan.

## 2020-11-28 NOTE — Telephone Encounter (Signed)
Toni Hill, patient states her right leg is continuing to hurt and the muscle relaxer is not helping.  She states in the past she has taken hydrocodone 10/325 and it has helped her leg pain, she is requesting a RX for this now. Per chart review, LOV was 11/19/20 with Dr. Allyson Sabal where it is noted that both Neurology and Dr. Allyson Sabal recommended referral to Physical Medicine and Rehabilitation for further management.  Pt was gently reminded of this by RN.  It is also noted that Physical Medicine and Rehab also reached out to patient again today via telephone, but there was no answer.  RN informed patient about this and she states she does not answer the phone if she doesn't recognize the number.  RN gave patient the phone number for Physical Medicine and rehab and pt states she will call them today to schedule an appt. Offered pt an appt next week at [facility] if she wants to discuss pain management further, she declines at this time. SChaplin, RN,BSN

## 2020-12-02 ENCOUNTER — Telehealth: Payer: Self-pay

## 2020-12-02 ENCOUNTER — Encounter (HOSPITAL_COMMUNITY): Payer: Self-pay | Admitting: Emergency Medicine

## 2020-12-02 ENCOUNTER — Emergency Department (HOSPITAL_COMMUNITY)
Admission: EM | Admit: 2020-12-02 | Discharge: 2020-12-02 | Disposition: A | Discharge status: Home / Self Care | Payer: Medicare Other | Attending: Emergency Medicine | Admitting: Emergency Medicine

## 2020-12-02 ENCOUNTER — Other Ambulatory Visit: Payer: Self-pay

## 2020-12-02 ENCOUNTER — Emergency Department (HOSPITAL_COMMUNITY): Payer: Medicare Other

## 2020-12-02 DIAGNOSIS — Z794 Long term (current) use of insulin: Secondary | ICD-10-CM | POA: Diagnosis not present

## 2020-12-02 DIAGNOSIS — Z8616 Personal history of COVID-19: Secondary | ICD-10-CM | POA: Diagnosis not present

## 2020-12-02 DIAGNOSIS — N183 Chronic kidney disease, stage 3 unspecified: Secondary | ICD-10-CM | POA: Diagnosis not present

## 2020-12-02 DIAGNOSIS — Z96652 Presence of left artificial knee joint: Secondary | ICD-10-CM | POA: Diagnosis not present

## 2020-12-02 DIAGNOSIS — Z7982 Long term (current) use of aspirin: Secondary | ICD-10-CM | POA: Insufficient documentation

## 2020-12-02 DIAGNOSIS — E1122 Type 2 diabetes mellitus with diabetic chronic kidney disease: Secondary | ICD-10-CM | POA: Insufficient documentation

## 2020-12-02 DIAGNOSIS — M25571 Pain in right ankle and joints of right foot: Secondary | ICD-10-CM | POA: Diagnosis not present

## 2020-12-02 DIAGNOSIS — Z7901 Long term (current) use of anticoagulants: Secondary | ICD-10-CM | POA: Insufficient documentation

## 2020-12-02 DIAGNOSIS — Z79899 Other long term (current) drug therapy: Secondary | ICD-10-CM | POA: Diagnosis not present

## 2020-12-02 DIAGNOSIS — E039 Hypothyroidism, unspecified: Secondary | ICD-10-CM | POA: Diagnosis not present

## 2020-12-02 DIAGNOSIS — I129 Hypertensive chronic kidney disease with stage 1 through stage 4 chronic kidney disease, or unspecified chronic kidney disease: Secondary | ICD-10-CM | POA: Insufficient documentation

## 2020-12-02 DIAGNOSIS — E1142 Type 2 diabetes mellitus with diabetic polyneuropathy: Secondary | ICD-10-CM | POA: Insufficient documentation

## 2020-12-02 DIAGNOSIS — M79671 Pain in right foot: Secondary | ICD-10-CM

## 2020-12-02 MED ORDER — OXYCODONE-ACETAMINOPHEN 5-325 MG PO TABS
1.0000 | ORAL_TABLET | Freq: Once | ORAL | Status: AC
Start: 2020-12-02 — End: 2020-12-02
  Administered 2020-12-02: 1 via ORAL
  Filled 2020-12-02: qty 1

## 2020-12-02 MED ORDER — HYDROCODONE-ACETAMINOPHEN 5-325 MG PO TABS: 1.0000 | ORAL_TABLET | Freq: Four times a day (QID) | ORAL | 0 refills | Status: DC | PRN

## 2020-12-02 NOTE — Discharge Instructions (Addendum)
It was a pleasure taking care of you today. As discussed, your x-ray showed osteopenia, but no broken bones. I suspect your symptoms are related to your spasms you have been having in your right leg. Continue to take your pain medication and muscle relaxer as prescribed. I am sending you home with a short course of pain medication. Take as needed for pain. Follow-up with PCP if symptoms do not improve within the next week. I have contacted our social workers to call you this week to help with transportation to your doctor's appointments. Return to the ER for new or worsening symptoms.

## 2020-12-02 NOTE — ED Provider Notes (Signed)
[facility] EMERGENCY DEPARTMENT Provider Note   CSN: [id number] Arrival date & time: 12/02/20  1845     History Chief Complaint  Patient presents with  . Ankle Pain    Toni Hill is a 75 y.o. female with a past medical history as noted below who presents to the ED due to persistent right foot pain x1 week.  Patient denies known injury.  She is currently bedbound due to right lower extremity pain. She has been evaluated by neurology who noted patient to have increased tone in right lower extremity which was believed to be related to hardware complication, complex regional pain syndrome, or diabetic lumbosacral plexopathy. Patient had a remote right ankle fracture in 2010 requiring open reduction internal fixation.  In 2021 patient was having increased right ankle pain which is thought to be related to the hardware when she had it removed in September 2021. Patient states her right foot has become "deformed" over the past week where her toes are curled under. She has been taking hydrocodone with no relief. Denies fever and chills. No erythema, edema, or warmth.   History obtained from patient and past medical records. No interpreter used during encounter.      Past Medical History:  Diagnosis Date  . Bilateral lower extremity edema    right > left  . Bipolar 1 disorder (HCC)   . CKD (chronic kidney disease) stage 3, GFR 30-59 ml/min (HCC)     Dr Crista Elliot, [facility]  . Diabetes, polyneuropathy (HCC)    FEET AND FINGER TIPS  . Fibromyalgia   . GERD (gastroesophageal reflux disease)   . History of chronic gastritis   . History of COVID-24 Aug 2019  . Hyperlipidemia   . Hypertension    per pt was take off bp medication because of kidney disease told by her nephrologist  . Hypothyroidism   . Mixed incontinence urge and stress   . OA (osteoarthritis)    KNEES  . OAB (overactive bladder)   . PONV (postoperative nausea and vomiting)   . Renal cyst    bilateral per  CT 06-27-2016  . Type 2 diabetes mellitus treated with insulin (HCC)   . Wears glasses     Patient Active Problem List   Diagnosis Date Noted  . Right leg weakness 06/12/2020  . 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 Constipation 06/06/2020  . Deep vein thrombosis (DVT) of proximal vein of right lower extremity (HCC) 05/26/2020  . Protein-calorie malnutrition, severe 01/04/2020  . Intractable nausea and vomiting 01/02/2020  . Nausea & vomiting 01/01/2020  . Preoperative evaluation to rule out surgical contraindication 11/19/2019  . Bilateral low back pain with sciatica 09/06/2019  . Peripheral neuropathy 09/06/2019  . Seborrheic keratoses 08/20/2019  . Left breast mass 08/20/2019  . Macrocytic anemia 03/08/2019  . Leg swelling 01/25/2019  . QT prolongation 01/25/2019  . At risk for adverse drug event 01/22/2019  . Bipolar 1 disorder (HCC)   . Osteoarthritis of left knee 12/30/2018  . Anxiety 12/30/2018  . Stage 3 chronic kidney disease 06/28/2016  . Hypercholesterolemia 08/20/2015  . Type 2 diabetes mellitus (HCC) 02/28/2014  . Esophageal reflux 02/28/2014  . Stress incontinence, female 02/28/2014  . Hypothyroidism (acquired) 02/28/2014  . Essential hypertension 11/17/2011    Past Surgical History:  Procedure Laterality Date  . CHOLECYSTECTOMY OPEN  1990's  . COLONOSCOPY  last one 07-17-2014  . CYSTOSCOPY WITH INJECTION N/A 10/29/2016  Procedure: CYSTOSCOPY WITH INJECTION BOTOX 100 UNITS, urethral dilation;  Surgeon: Carolan Clines, MD;  Location: [place];  Service: Urology;  Laterality: N/A;  . ESOPHAGOGASTRODUODENOSCOPY  last one 07-01-2016  . EXCISIONAL BREAST BX  1990   BENIGN  . HARDWARE REMOVAL Right 03/05/2020   Procedure: HARDWARE REMOVAL RIGHT ANKLE;  Surgeon: Leandrew Koyanagi, MD;  Location: [place];  Service: Orthopedics;  Laterality: Right;  . ORIF TIBIA & FIBULA FRACTURES  2010   retained  rod  . TONSILLECTOMY  age 43  . TOTAL KNEE ARTHROPLASTY Left 01/15/2019   Procedure: LEFT TOTAL KNEE ARTHROPLASTY;  Surgeon: Leandrew Koyanagi, MD;  Location: [place];  Service: Orthopedics;  Laterality: Left;  Marland Kitchen VAGINAL HYSTERECTOMY  1979  . VENTRAL HERNIA REPAIR  50's     OB History    Gravida  2   Para  2   Term      Preterm      AB      Living  2     SAB      IAB      Ectopic      Multiple      Live Births              Family History  Problem Relation Age of Onset  . Breast cancer Mother 47  . Cancer Father        ? type  . Breast cancer Sister 84  . Breast cancer Maternal Grandmother        ? age    Social History   Tobacco Use  . Smoking status: Never Smoker  . Smokeless tobacco: Never Used  Vaping Use  . Vaping Use: Never used  Substance Use Topics  . Alcohol use: No  . Drug use: No    Home Medications Prior to Admission medications   Medication Sig Start Date End Date Taking? Authorizing Provider  acetaminophen (TYLENOL) 500 MG tablet Take 1,000 mg by mouth in the morning and at bedtime.   Yes [provider]  amLODipine (NORVASC) 5 MG tablet Take 5 mg by mouth daily. 05/03/19  Yes [provider]  Calcium Carb-Cholecalciferol (CALCIUM 600/VITAMIN D3) 600-800 MG-UNIT TABS Take 1 tablet by mouth daily.    Yes [provider]  ezetimibe (ZETIA) 10 MG tablet TAKE 1 TABLET BY MOUTH EVERY DAY IN THE EVENING 08/18/20  Yes Cato Mulligan, MD  HYDROcodone-acetaminophen (NORCO/VICODIN) 5-325 MG tablet Take 1 tablet by mouth every 6 (six) hours as needed for severe pain. 12/02/20  Yes Tacha Manni C, PA-C  insulin detemir (LEVEMIR FLEXTOUCH) 100 UNIT/ML FlexPen Inject 15 Units into the skin daily. 08/18/20  Yes Cato Mulligan, MD  levothyroxine (SYNTHROID) 137 MCG tablet Take 1 tablet (137 mcg total) by mouth daily before breakfast. Patient taking differently: Take 137 mcg by mouth daily before breakfast. Brand name only  09/16/20  Yes Maudie Mercury, MD  lidocaine (LIDODERM) 5 % PLACE 1 PATCH ONTO THE SKIN DAILY. REMOVE & DISCARD PATCH WITHIN 12 HOURS OR AS DIRECTED BY MD 10/27/20  Yes Amponsah, [name] March, MD  NOVOLOG FLEXPEN 100 UNIT/ML FlexPen Inject 2-6 Units into the skin 3 (three) times daily with meals. Sliding scale 05/12/20  Yes [provider]  ondansetron (ZOFRAN) 4 MG tablet Take 1 tablet (4 mg total) by mouth 2 (two) times daily. 09/05/20  Yes Sid Falcon, MD  pantoprazole (PROTONIX) 40 MG tablet TAKE 1 TABLET BY MOUTH EVERY DAY 07/18/20  Yes Lacinda Axon, MD  pregabalin (LYRICA) 50 MG capsule Take 1 capsule (50 mg total) by mouth 3 (three) times daily. 10/27/20  Yes Lacinda Axon, MD  senna-docusate (SENOKOT-S) 8.6-50 MG tablet Take 1 tablet by mouth daily. 09/08/20 12/07/20 Yes Maudie Mercury, MD  sertraline (ZOLOFT) 100 MG tablet TAKE 2 TABLETS BY MOUTH EVERY DAY Patient taking differently: Take 200 mg by mouth daily. 11/15/20  Yes Asencion Noble, MD  simvastatin (ZOCOR) 40 MG tablet Take 40 mg by mouth daily. 02/11/17  Yes [provider]  tiZANidine (ZANAFLEX) 4 MG tablet Take 1 tablet (4 mg total) by mouth in the morning and at bedtime. 10/23/20 01/21/21 Yes Marianna Payment, MD  VITAMIN E PO Take 4,000 Units by mouth 2 (two) times daily.   Yes [provider]  APIXABAN Arne Cleveland) VTE STARTER PACK ($RemoveBefor'10MG'ugqfKqRQZPbo$  AND $Re'5MG'Ega$ ) Take 5 mg by mouth in the morning and at bedtime. Patient not taking: No sig reported 05/23/20   [provider]  aspirin 81 MG EC tablet Take 1 tablet by mouth daily. Patient not taking: No sig reported 02/11/17   [provider]  atorvastatin (LIPITOR) 20 MG tablet Take 1 tablet (20 mg total) by mouth daily. Patient not taking: No sig reported 10/02/20   Seawell, Jaimie A, DO  baclofen (LIORESAL) 10 MG tablet Take by mouth. Patient not taking: No sig reported    [provider]  Blood Glucose Monitoring Suppl (ONETOUCH VERIO  REFLECT) w/Device KIT Check blood sugar 3 times a day 03/14/20   Maudie Mercury, MD  Cholecalciferol 50 MCG (2000 UT) CAPS Take 2,000 Units by mouth daily.  Patient not taking: No sig reported    [provider]  gabapentin (NEURONTIN) 300 MG capsule Take by mouth. Patient not taking: No sig reported    [provider]  glucose blood (ONETOUCH VERIO) test strip Check blood sugar 3 times a day 03/14/20   Maudie Mercury, MD  HYDROcodone-acetaminophen (NORCO/VICODIN) 5-325 MG tablet Take 1-2 tablets by mouth every 6 (six) hours as needed. Patient not taking: No sig reported 08/27/20   [provider]  Insulin Pen Needle (B-D ULTRAFINE III SHORT PEN) 31G X 8 MM MISC 1 each by Does not apply route daily. Diagnosis code: E11.9. Used to inject insulin daily. 02/20/20   Asencion Noble, MD  OneTouch Delica Lancets 94H MISC Check blood sugar 3 times a day 03/14/20   Maudie Mercury, MD  polyethylene glycol (MIRALAX) 17 g packet Take 17 g by mouth daily. Patient not taking: No sig reported 06/05/20   Asencion Noble, MD  vitamin B-12 (CYANOCOBALAMIN) 500 MCG tablet Take 1,000 mcg by mouth in the morning and at bedtime.  Patient not taking: No sig reported    [provider]    Allergies    Bactrim [sulfamethoxazole-trimethoprim], Cephalexin, Gabapentin, Morphine and related, Vioxx [rofecoxib], and Penicillins  Review of Systems   Review of Systems  Constitutional: Negative for chills and fever.  Musculoskeletal: Positive for arthralgias and gait problem.  Neurological: Negative for numbness.  All other systems reviewed and are negative.   Physical Exam Updated Vital Signs BP 128/71 (BP Location: Right Arm)   Pulse 79   Temp 97.7 F (36.5 C) (Oral)   Resp 20   Ht $R'5\' 10"'tg$  (1.778 m)   Wt 68 kg   SpO2 98%   BMI 21.52 kg/m   Physical Exam Vitals and nursing note reviewed.  Constitutional:      General:  She is not in acute distress.    Appearance: She  is not ill-appearing.  HENT:     Head: Normocephalic.  Eyes:     Pupils: Pupils are equal, round, and reactive to light.  Cardiovascular:     Rate and Rhythm: Normal rate and regular rhythm.     Pulses: Normal pulses.     Heart sounds: Normal heart sounds. No murmur heard. No friction rub. No gallop.   Pulmonary:     Effort: Pulmonary effort is normal.     Breath sounds: Normal breath sounds.  Abdominal:     General: Abdomen is flat. There is no distension.     Palpations: Abdomen is soft.     Tenderness: There is no abdominal tenderness. There is no guarding or rebound.  Musculoskeletal:        General: Normal range of motion.     Cervical back: Neck supple.     Comments: Unable to move RLE (chronic). Deformity to right foot with tenderness throughout. Pedal pulses palpable. No surrounding edema, erythema, or warmth.   Skin:    General: Skin is warm and dry.  Neurological:     General: No focal deficit present.     Mental Status: She is alert.  Psychiatric:        Mood and Affect: Mood normal.        Behavior: Behavior normal.     ED Results / Procedures / Treatments   Labs (all labs ordered are listed, but only abnormal results are displayed) Labs Reviewed - No data to display  EKG None  Radiology DG Ankle Complete Right  Result Date: 12/02/2020 CLINICAL DATA:  Right ankle pain EXAM: RIGHT ANKLE - COMPLETE 2 VIEW COMPARISON:  05/11/2020 FINDINGS: Disuse osteopenia is noted. Medullary rod is noted in the distal tibia. Healed distal fibular and tibial fractures are noted. No other focal abnormality is noted. IMPRESSION: No acute abnormality seen. Electronically Signed   By: Inez Catalina M.D.   On: 12/02/2020 20:06   DG Foot Complete Right  Result Date: 12/02/2020 CLINICAL DATA:  Right foot pain, initial encounter EXAM: RIGHT FOOT COMPLETE - 3+ VIEW COMPARISON:  None. FINDINGS: Medullary rod is noted with healed distal tibial and fibular fractures. Disuse osteopenia is  noted. No acute fracture or dislocation is noted. No soft tissue abnormality is seen. 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 Electronically Signed   By: Inez Catalina M.D.   On: 12/02/2020 20:05    Procedures Procedures   Medications Ordered in ED Medications  oxyCODONE-acetaminophen (PERCOCET/ROXICET) 5-325 MG per tablet 1 tablet (1 tablet Oral Given 12/02/20 1943)    ED Course  I have reviewed the triage vital signs and the nursing notes.  Pertinent labs & imaging results that were available during my care of the patient were reviewed by me and considered in my medical decision making (see chart for details).  Clinical Course as of 12/02/20 2053  Tue Dec 02, 2020  2022 She is here for continued spasm and pain in her right foot since she had her hardware removed.  She feels that she needs an MRI of her foot to figure out what is going on with it.  She is has difficulty with transportation to doctors appointments due to the paralysis of her right side.  No new trauma.  X-rays do not show any acute fractures.  We will put in a social work consult to see if they can help with transportation issues.  No indications  for admission or transfer at this time. [MB]    Clinical Course User Index [MB] Hayden Rasmussen, MD   MDM Rules/Calculators/A&P                         75 year old female presents to the ED due to persistent right foot pain x1 week.  Patient has a complicated history of right lower extremity spasms and pain for the past 8 months and has been evaluated by neurology with unclear etiology.  Patient states right foot has become deformed over the past week.  No known injury.  Patient is currently bedbound.  Vitals all within normal limits.  Noticeable deformity to right foot without erythema, edema, or warmth.  Doubt septic joint. Pedal pulses palpable. Patient unable to move RLE with is chronic. 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  Suspect symptoms related to increased tone and spasticity of RLE which is a chronic issue. Patient discharged with short course of pain medication. Advised patient to continue taking medications as previously prescribed. Patient stable for discharge with PCP follow-up. Strict ED precautions discussed with patient. Patient states understanding and agrees to plan. Patient discharged home in no acute distress and stable vitals.  Discussed case with Dr. Melina Copa who evaluated patient at bedside and agrees with assessment and plan.  Final Clinical Impression(s) / ED Diagnoses Final diagnoses:  Right foot pain    Rx / DC Orders ED Discharge Orders         Ordered    HYDROcodone-acetaminophen (NORCO/VICODIN) 5-325 MG tablet  Every 6 hours PRN        12/02/20 2024           Karie Kirks 12/02/20 2053    Hayden Rasmussen, MD 12/03/20 1015

## 2020-12-02 NOTE — Telephone Encounter (Signed)
t is requesting a call back she is crying stating that her feet are hurting her fr the past 6 months now and her medication is that working

## 2020-12-02 NOTE — Telephone Encounter (Signed)
Received TC from patient who is crying and states she is having a lot of pain in her right foot.  She states she cannot see her foot, but her sister looked at it and told her "it looks like the foot has collapsed on itself from the underneath".  Patient states her sister noticed this 1 week ago, but didn't tell the patient because she did not want to upset the patient.  RN noted patient has DM and is on insulin.  Informed patient there is a serious condition called Charcot foot that she needs to be evaluated for and to present to ED for evaluation.  Pt informed she needs an in-person evaluation for diagnosis, this cannot be diagnosed over the phone.  Pt continues to cry and states she has to call an ambulance because she cannot walk.  She was instructed to call 911 for transport and she verbalized understanding. SChaplin, RN,BSN

## 2020-12-02 NOTE — ED Notes (Signed)
EMS transport called.

## 2020-12-02 NOTE — ED Triage Notes (Signed)
Pt arrives EMS from home c/o severe right ankle pain. Pt is paralyzed on the right side. PT noticed today that her left foot was starting to turn inwards, called PCP and told to come to ED for eval.

## 2020-12-03 ENCOUNTER — Telehealth: Payer: Self-pay

## 2020-12-03 DIAGNOSIS — F444 Conversion disorder with motor symptom or deficit: Secondary | ICD-10-CM

## 2020-12-03 DIAGNOSIS — R29898 Other symptoms and signs involving the musculoskeletal system: Secondary | ICD-10-CM

## 2020-12-03 NOTE — Telephone Encounter (Signed)
Agree with recommendation for presentation to ED for evaluation.

## 2020-12-03 NOTE — Telephone Encounter (Signed)
Pt is requesting a call back she stated that she did go to the Er as directed yesterday but the EMS wont bring her down to [place] . They could not cross the city lines and took her to [employer] . Pt stated that she is  Still in pain and she is crying

## 2020-12-03 NOTE — Telephone Encounter (Signed)
Dr. Gilford Rile,  Can you refer this patient to [facility] and [facility] social worker for help with chronic disease management and transportation issues?  She was referred to Physical Pain and Rehab by [facility] and Neuro and there are several notes on the referral that patient will not schedule at this time or she will not answer the phone.    Do you want to have her added to your telehealth schedule?  I am not sure what else to do. Thanks, SChaplin, RN,BSN

## 2020-12-04 NOTE — Telephone Encounter (Signed)
Patient called back regarding pain in her leg, requesting call back from PCP. States she was not aware of referral to  [facility]. Explained they could help her manage the pain she is having. Gave her their contact number [phone] with instructions to call them as soon as we end call. She wrote the number down and repeated it back correctly. She will call them now. She was very Patent attorney. Also, confirmed tele appt with Dr. Theodis Shove for 6/6 at 1 PM.

## 2020-12-04 NOTE — Telephone Encounter (Signed)
As of note, there is also a call documented on 11/28/20 where this RN made patient aware of referral to Physical Medicine and Rehabilitation and provided patient with their phone number.  Patient stated on that day, she was going to call and make an appointment that afternoon.  Please see note from 11/28/20.

## 2020-12-05 NOTE — Telephone Encounter (Signed)
Spoke with Toni Hill this morning. She states that she continues to have leg and foot pain. We discussed following up with PM&R and scheduling the appointment yesterday after speaking with RN. She has not yet called to schedule an appointment, highly recommended that she should schedule that appointment ASAP. She agreed to call today.   Additionally, I have filled out transport papers for her, provided by Dr. Theodis Shove, so hopefully she will be able to make appointments. I will additionally place a referral to [facility] to assist with any other transportation needs. I appreciate everyone's work with assisting [name] healthcare needs.

## 2020-12-08 ENCOUNTER — Ambulatory Visit: Payer: Medicare Other | Admitting: Behavioral Health

## 2020-12-08 ENCOUNTER — Telehealth: Payer: Self-pay | Admitting: *Deleted

## 2020-12-08 ENCOUNTER — Other Ambulatory Visit: Payer: Self-pay

## 2020-12-08 DIAGNOSIS — F331 Major depressive disorder, recurrent, moderate: Secondary | ICD-10-CM

## 2020-12-08 DIAGNOSIS — F419 Anxiety disorder, unspecified: Secondary | ICD-10-CM

## 2020-12-08 NOTE — BH Specialist Note (Signed)
Integrated Behavioral Health via Telemedicine Visit  12/08/2020 HARA MILHOLLAND [id number]  Number of [place] visits: 2/6 Session Start time: 1:00pm  Session End time: 1:30pm Total time: 30  Referring Provider: Dr. Maudie Mercury, MD Patient/Family location: Pt is home w/her Str in private [facility] Provider location: [facility] Office All persons participating in visit: Pt & Clinician Types of Service: Individual psychotherapy  I connected with Toni Hill and/or Toni Hill's self via  Telephone or Video Enabled Telemedicine Application  (Video is Caregility application) and verified that I am speaking with the correct person using two identifiers. Discussed confidentiality: Yes   I discussed the limitations of telemedicine and the availability of in person appointments.  Discussed there is a possibility of technology failure and discussed alternative modes of communication if that failure occurs.  I discussed that engaging in this telemedicine visit, they consent to the provision of behavioral healthcare and the services will be billed under their insurance.  Patient and/or legal guardian expressed understanding and consented to Telemedicine visit: Yes   Presenting Concerns: Patient and/or family reports the following symptoms/concerns: elevated anx due to pain in  R foot; esp'ly her big & small toes. Pt is fearful she will never be able to walk again. Duration of problem: wks - months; Severity of problem: moderate to severe  Patient and/or Family's Strengths/Protective Factors: Social connections, Social and Emotional competence and [place] supports in place (healthy food, safe environments, etc.)  Goals Addressed: Patient will: 1.  Reduce symptoms of: anxiety, depression, stress and crying  2.  Increase knowledge and/or ability of: coping skills, stress reduction and address pain issues w/coping skills  3.  Demonstrate ability to: Increase healthy adjustment to  current life circumstances and Increase adequate support systems for patient/family  Progress towards Goals: Ongoing  Interventions: Interventions utilized:  Mindfulness or Relaxation Training and Supportive Counseling Standardized Assessments completed: screeners prn  Patient and/or Family Response: Pt receptive to call today, c/o constant R foot pain exacerbated on great toe & small toe. Pt is fearful & crying during the session  Assessment: Patient currently experiencing pain in R foot. Pt has completed her last 8 pain pills & is totally out today. She is in pain, scared & fearful.  Patient may benefit from supportive pain reduction techniques. Reminded Pt what we discussed to assist in pain mgmt. Pt will try these coping skills more today.  Plan: 1. Follow up with behavioral health clinician on : 2-3 wks on telehealth for 30 min ck-in 2. Behavioral recommendations: Implement pain reductions techniques today. Clinician will alert Triage RN. 3. Referral(s): [place] (In Clinic)  I discussed the assessment and treatment plan with the patient and/or parent/guardian. They were provided an opportunity to ask questions and all were answered. They agreed with the plan and demonstrated an understanding of the instructions.   They were advised to call back or seek an in-person evaluation if the symptoms worsen or if the condition fails to improve as anticipated.  Donnetta Hutching, LMFT

## 2020-12-08 NOTE — Telephone Encounter (Signed)
Requesting to speak with Dr. Gilford Rile, please call pt back.

## 2020-12-08 NOTE — Telephone Encounter (Signed)
Pt had told Dr Theodis Shove she needed pain medication refills. She was given Hydrocodone on 5/31 when she went to the ED. I called Ms Schubring- I asked again if she had called pain management (telephone given to pt twice before); stated she has not, unsure if she has the number. Telephone # given to her sister to write down; stated she will call.

## 2020-12-08 NOTE — Telephone Encounter (Signed)
-----   Message from Donnetta Hutching, [place] sent at 12/08/2020  1:36 PM EDT ----- Al Corpus-  This is the Pt we spoke about. She was crying the entire 30 min I spoke w/her due to pain levels. She felt R-side foot pain, esp'ly on her big & small toes as her foot is turned.  I asked her if she has Tylenol to tide her over until the prescription can be addressed. "It does not touch the pain" she replied. This Pt is 74yo-I hope we can do something to assist her-  Dr. Theodis Shove

## 2020-12-09 ENCOUNTER — Encounter: Payer: Self-pay | Admitting: *Deleted

## 2020-12-09 ENCOUNTER — Ambulatory Visit: Payer: Medicare Other | Admitting: Neurology

## 2020-12-09 NOTE — Telephone Encounter (Signed)
Please call pt back.

## 2020-12-10 ENCOUNTER — Other Ambulatory Visit: Payer: Self-pay

## 2020-12-10 ENCOUNTER — Telehealth: Payer: Self-pay

## 2020-12-10 ENCOUNTER — Ambulatory Visit: Payer: Medicare Other | Admitting: Behavioral Health

## 2020-12-10 DIAGNOSIS — F331 Major depressive disorder, recurrent, moderate: Secondary | ICD-10-CM

## 2020-12-10 DIAGNOSIS — F419 Anxiety disorder, unspecified: Secondary | ICD-10-CM

## 2020-12-10 NOTE — Telephone Encounter (Signed)
Dr. Theodis Shove called patient yesterday. In-person appt given for 6/14 at 0945 with PCP.
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
## 2020-12-10 NOTE — Telephone Encounter (Signed)
   Telephone encounter was:  Unsuccessful.  12/10/2020 Name: Toni Hill MRN: [id number] DOB: 1945/07/24  Unsuccessful outbound call made today to assist with:  Transportation Needs  and Left message on voicemail for patient to return my call regarding Medicaid transportation.  Outreach Attempt:  1st Attempt  A HIPAA compliant voice message was left requesting a return call.  Instructed patient to call back at [phone].  Khadijatou Borak, AAS Paralegal, [place] . Embedded Care Coordination [facility]  Care Management  [address], [place] [address] ??[email or web address]  ?? [phone]   [email or web address]

## 2020-12-15 ENCOUNTER — Encounter: Payer: Self-pay | Admitting: Physical Medicine and Rehabilitation

## 2020-12-15 ENCOUNTER — Telehealth: Payer: Self-pay

## 2020-12-15 NOTE — Telephone Encounter (Signed)
   Telephone encounter was:  Successful.  12/15/2020 Name: Toni Hill MRN: [id number] DOB: 03/06/1946  Toni Hill is a 75 y.o. year old female who is a primary care patient of Maudie Mercury, MD . The community resource team was consulted for assistance with Transportation Needs   Care guide performed the following interventions: Spoke with patient about [facility] [phone] asked patient if she needed assistance calling and she said she could call without assistance.  Told patient she would need to call at least 3 days in advance of appointments. .  Follow Up Plan:  No further follow up planned at this time. The patient has been provided with needed resources.  [name], AAS Paralegal, [place] [facility]  [address], [place] [address] ??[email or web address]  ?? [phone]   [email or web address]

## 2020-12-15 NOTE — Telephone Encounter (Signed)
   Telephone encounter was:  Unsuccessful.  12/15/2020 Name: Toni Hill MRN: [id number] DOB: 1945-11-23  Unsuccessful outbound call made today to assist with:  Returned patient's call she asked that I call her sister's number [phone] since she is having trouble with her phone. Left message on voicemail for patient to return my call regarding Medicaid transportation.   Outreach Attempt:  2nd Attempt  A HIPAA compliant voice message was left requesting a return call.  Instructed patient to call back at [phone].  Karesha Trzcinski, AAS Paralegal, [place] [facility]  [address], [place] [address] ??[email or web address]  ?? [phone]   [email or web address]

## 2020-12-16 ENCOUNTER — Ambulatory Visit (INDEPENDENT_AMBULATORY_CARE_PROVIDER_SITE_OTHER): Payer: Medicare Other | Admitting: Internal Medicine

## 2020-12-16 ENCOUNTER — Encounter: Payer: Self-pay | Admitting: Internal Medicine

## 2020-12-16 ENCOUNTER — Other Ambulatory Visit: Payer: Self-pay

## 2020-12-16 VITALS — BP 109/63 | HR 79 | Temp 97.8°F | Ht 69.0 in

## 2020-12-16 DIAGNOSIS — M85879 Other specified disorders of bone density and structure, unspecified ankle and foot: Secondary | ICD-10-CM | POA: Diagnosis not present

## 2020-12-16 DIAGNOSIS — E039 Hypothyroidism, unspecified: Secondary | ICD-10-CM

## 2020-12-16 DIAGNOSIS — E559 Vitamin D deficiency, unspecified: Secondary | ICD-10-CM

## 2020-12-16 DIAGNOSIS — E1140 Type 2 diabetes mellitus with diabetic neuropathy, unspecified: Secondary | ICD-10-CM | POA: Diagnosis present

## 2020-12-16 DIAGNOSIS — Z794 Long term (current) use of insulin: Secondary | ICD-10-CM | POA: Diagnosis not present

## 2020-12-16 DIAGNOSIS — F444 Conversion disorder with motor symptom or deficit: Secondary | ICD-10-CM

## 2020-12-16 DIAGNOSIS — N1832 Chronic kidney disease, stage 3b: Secondary | ICD-10-CM | POA: Diagnosis not present

## 2020-12-16 LAB — POCT GLYCOSYLATED HEMOGLOBIN (HGB A1C): Hemoglobin A1C: 7.4 % — AB (ref 4.0–5.6)

## 2020-12-16 LAB — GLUCOSE, CAPILLARY: Glucose-Capillary: 299 mg/dL — ABNORMAL HIGH (ref 70–99)

## 2020-12-16 MED ORDER — PREGABALIN 75 MG PO CAPS: 75.0000 mg | ORAL_CAPSULE | Freq: Three times a day (TID) | ORAL | 1 refills | Status: DC

## 2020-12-16 NOTE — Patient Instructions (Addendum)
To Toni Hill,   It was a pleasure seeing you today! Today we discussed the pain in your leg/foot, diabetes, and thyroid.   For your foot and leg pain, we will increase your Lyrica to 75 mg three times a day instead of the 50 mg. I will reach out to the pain specialist to see if we can get you an earlier appointment. Please continue to take your tizanidine as well for muscle spasms.   For your diabetes, I will check your A1c today to see if we need to make any adjustments in your medication.   For your thyroid, I am going to check your levels today to see if there are any adjustments that need to be made. You are currently taking synthroid 137 mcg.

## 2020-12-17 ENCOUNTER — Telehealth: Payer: Self-pay

## 2020-12-17 ENCOUNTER — Emergency Department (HOSPITAL_COMMUNITY): Payer: Medicare Other

## 2020-12-17 ENCOUNTER — Other Ambulatory Visit: Payer: Self-pay

## 2020-12-17 ENCOUNTER — Encounter (HOSPITAL_COMMUNITY): Payer: Self-pay

## 2020-12-17 ENCOUNTER — Emergency Department (HOSPITAL_COMMUNITY)
Admission: EM | Admit: 2020-12-17 | Discharge: 2020-12-17 | Disposition: A | Discharge status: Home / Self Care | Payer: Medicare Other | Attending: Emergency Medicine | Admitting: Emergency Medicine

## 2020-12-17 ENCOUNTER — Other Ambulatory Visit: Payer: Self-pay | Admitting: Nephrology

## 2020-12-17 DIAGNOSIS — E1122 Type 2 diabetes mellitus with diabetic chronic kidney disease: Secondary | ICD-10-CM | POA: Diagnosis not present

## 2020-12-17 DIAGNOSIS — R1030 Lower abdominal pain, unspecified: Secondary | ICD-10-CM | POA: Insufficient documentation

## 2020-12-17 DIAGNOSIS — Z794 Long term (current) use of insulin: Secondary | ICD-10-CM | POA: Diagnosis not present

## 2020-12-17 DIAGNOSIS — N183 Chronic kidney disease, stage 3 unspecified: Secondary | ICD-10-CM | POA: Insufficient documentation

## 2020-12-17 DIAGNOSIS — R300 Dysuria: Secondary | ICD-10-CM | POA: Diagnosis not present

## 2020-12-17 DIAGNOSIS — Z96652 Presence of left artificial knee joint: Secondary | ICD-10-CM | POA: Insufficient documentation

## 2020-12-17 DIAGNOSIS — Z8616 Personal history of COVID-19: Secondary | ICD-10-CM | POA: Insufficient documentation

## 2020-12-17 DIAGNOSIS — I129 Hypertensive chronic kidney disease with stage 1 through stage 4 chronic kidney disease, or unspecified chronic kidney disease: Secondary | ICD-10-CM | POA: Diagnosis not present

## 2020-12-17 DIAGNOSIS — E1142 Type 2 diabetes mellitus with diabetic polyneuropathy: Secondary | ICD-10-CM | POA: Insufficient documentation

## 2020-12-17 DIAGNOSIS — Z79899 Other long term (current) drug therapy: Secondary | ICD-10-CM | POA: Diagnosis not present

## 2020-12-17 DIAGNOSIS — M545 Low back pain, unspecified: Secondary | ICD-10-CM | POA: Diagnosis not present

## 2020-12-17 DIAGNOSIS — R339 Retention of urine, unspecified: Secondary | ICD-10-CM | POA: Insufficient documentation

## 2020-12-17 DIAGNOSIS — N1832 Chronic kidney disease, stage 3b: Secondary | ICD-10-CM

## 2020-12-17 DIAGNOSIS — E039 Hypothyroidism, unspecified: Secondary | ICD-10-CM | POA: Insufficient documentation

## 2020-12-17 LAB — URINALYSIS, ROUTINE W REFLEX MICROSCOPIC
Bacteria, UA: NONE SEEN
Bilirubin Urine: NEGATIVE
Glucose, UA: >=500 mg/dL — AB
Ketones, ur: NEGATIVE mg/dL
Leukocytes,Ua: NEGATIVE
Nitrite: NEGATIVE
Protein, ur: NEGATIVE mg/dL
Specific Gravity, Urine: 1.014 (ref 1.005–1.030)
pH: 5 (ref 5.0–8.0)

## 2020-12-17 LAB — CBC WITH DIFFERENTIAL/PLATELET
Abs Immature Granulocytes: 0.01 10*3/uL (ref 0.00–0.07)
Basophils Absolute: 0 10*3/uL (ref 0.0–0.1)
Basophils Relative: 0 %
Eosinophils Absolute: 0 10*3/uL (ref 0.0–0.5)
Eosinophils Relative: 0 %
HCT: 42.1 % (ref 36.0–46.0)
Hemoglobin: 13.3 g/dL (ref 12.0–15.0)
Immature Granulocytes: 0 %
Lymphocytes Relative: 23 %
Lymphs Abs: 1.3 10*3/uL (ref 0.7–4.0)
MCH: 31.1 pg (ref 26.0–34.0)
MCHC: 31.6 g/dL (ref 30.0–36.0)
MCV: 98.4 fL (ref 80.0–100.0)
Monocytes Absolute: 0.5 10*3/uL (ref 0.1–1.0)
Monocytes Relative: 9 %
Neutro Abs: 3.9 10*3/uL (ref 1.7–7.7)
Neutrophils Relative %: 68 %
Platelets: 200 10*3/uL (ref 150–400)
RBC: 4.28 MIL/uL (ref 3.87–5.11)
RDW: 12.5 % (ref 11.5–15.5)
WBC: 5.8 10*3/uL (ref 4.0–10.5)
nRBC: 0 % (ref 0.0–0.2)

## 2020-12-17 LAB — LIPID PANEL
Chol/HDL Ratio: 3.2 ratio (ref 0.0–4.4)
Cholesterol, Total: 133 mg/dL (ref 100–199)
HDL: 42 mg/dL (ref >39)
LDL Chol Calc (NIH): 62 mg/dL (ref 0–99)
Triglycerides: 175 mg/dL — ABNORMAL HIGH (ref 0–149)
VLDL Cholesterol Cal: 29 mg/dL (ref 5–40)

## 2020-12-17 LAB — COMPREHENSIVE METABOLIC PANEL
ALT: 16 U/L (ref 0–44)
AST: 16 U/L (ref 15–41)
Albumin: 3.8 g/dL (ref 3.5–5.0)
Alkaline Phosphatase: 65 U/L (ref 38–126)
Anion gap: 7 (ref 5–15)
BUN: 46 mg/dL — ABNORMAL HIGH (ref 8–23)
CO2: 30 mmol/L (ref 22–32)
Calcium: 9.5 mg/dL (ref 8.9–10.3)
Chloride: 103 mmol/L (ref 98–111)
Creatinine, Ser: 1.32 mg/dL — ABNORMAL HIGH (ref 0.44–1.00)
GFR, Estimated: 43 mL/min — ABNORMAL LOW (ref >60)
Glucose, Bld: 278 mg/dL — ABNORMAL HIGH (ref 70–99)
Potassium: 4.3 mmol/L (ref 3.5–5.1)
Sodium: 140 mmol/L (ref 135–145)
Total Bilirubin: 0.4 mg/dL (ref 0.3–1.2)
Total Protein: 6.7 g/dL (ref 6.5–8.1)

## 2020-12-17 LAB — VITAMIN D 25 HYDROXY (VIT D DEFICIENCY, FRACTURES): Vit D, 25-Hydroxy: 49.5 ng/mL (ref 30.0–100.0)

## 2020-12-17 LAB — TSH: TSH: 0.104 u[IU]/mL — ABNORMAL LOW (ref 0.450–4.500)

## 2020-12-17 MED ORDER — SODIUM CHLORIDE 0.9 % IV BOLUS
1000.0000 mL | Freq: Once | INTRAVENOUS | Status: AC
  Administered 2020-12-17: 1000 mL via INTRAVENOUS

## 2020-12-17 NOTE — Assessment & Plan Note (Signed)
Patient on Synthroid 137 mcg daily. Last TSH of 10.  - TSH, will readjust as needed - Continue current regimen

## 2020-12-17 NOTE — ED Triage Notes (Signed)
Pt presents to ED with complaints of difficulty urinating since last night and lower back pain.

## 2020-12-17 NOTE — Assessment & Plan Note (Signed)
Last A1c of 8.1. Currently on 15 units of nightly Levemir and Novolog 2-6 TID with meals. She did not bring her meter with her today, but states that she thinks her sugars have been elevated. Will collect A1c today.  - A1c - Continue current regimen

## 2020-12-17 NOTE — Telephone Encounter (Signed)
Received a TC from [place] at [facility] who is asking if it is possible to add a renal panel and urinalysis to labwork from patient's visit yesterday with PCP.  RN spoke with Linus Orn in the lab who states she does not have any urine on the patient and only 1 tube of blood was collected for the 3 tests which were ordered yesterday.  She states she can add a renal panel to this, but there is no guarantee there will be enough left to result the renal panel.  Estill Bamberg was informed of above and states she would like [place] to try and add the renal panel.  She states she will fax over an order from Dr. Carolin Sicks. SChaplin, RN,BSN

## 2020-12-17 NOTE — ED Notes (Signed)
Patient transported to CT 

## 2020-12-17 NOTE — ED Provider Notes (Signed)
[facility] EMERGENCY DEPARTMENT Provider Note   CSN: [id number] Arrival date & time: 12/17/20  1347     History Chief Complaint  Patient presents with  . Dysuria    Toni Hill is a 75 y.o. female.  HPI  Patient with significant medical history of bipolar, diabetes, fibromyalgia, hyperlipidemia, type 2 diabetes presents with chief complaint of difficulty to urinate.  Patient states this morning she was unable to urinate, endorses pain in her lower abdomen, patient states pain has started this morning, does not radiate, states she feels if she has to pee but is unable to.  She also endorses lower back pain, states this pain is chronic, has not gotten any worse, she denies dysuria, hematuria, discharge or vaginal bleeding, she denies nausea, vomiting, diarrhea, admits to some constipation.  She denies alleviating factors, never had this in the past.  Patient denies headaches, fevers, chills, chest pain or shortness of breath.  Past Medical History:  Diagnosis Date  . Bilateral lower extremity edema    right > left  . Bipolar 1 disorder ([place])   . CKD (chronic kidney disease) stage 3, GFR 30-59 ml/min (HCC)     Dr Lawson Radar, [facility]  . Diabetes, polyneuropathy ([place])    FEET AND FINGER TIPS  . Fibromyalgia   . GERD (gastroesophageal reflux disease)   . History of chronic gastritis   . History of COVID-24 Aug 2019  . Hyperlipidemia   . Hypertension    per pt was take off bp medication because of kidney disease told by her nephrologist  . Hypothyroidism   . Mixed incontinence urge and stress   . OA (osteoarthritis)    KNEES  . OAB (overactive bladder)   . PONV (postoperative nausea and vomiting)   . 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 Type 2 diabetes mellitus treated with insulin ([place])   . Wears glasses     Patient Active Problem List   Diagnosis Date Noted  . Right leg weakness 06/12/2020  . 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 Constipation 06/06/2020  . Deep vein thrombosis (DVT) of proximal vein of right lower extremity ([place]) 05/26/2020  . Protein-calorie malnutrition, severe 01/04/2020  . Intractable nausea and vomiting 01/02/2020  . Nausea & vomiting 01/01/2020  . Preoperative evaluation to rule out surgical contraindication 11/19/2019  . Bilateral low back pain with sciatica 09/06/2019  . Peripheral neuropathy 09/06/2019  . Seborrheic keratoses 08/20/2019  . Left breast mass 08/20/2019  . Macrocytic anemia 03/08/2019  . Leg swelling 01/25/2019  . QT prolongation 01/25/2019  . At risk for adverse drug event 01/22/2019  . Bipolar 1 disorder ([place])   . Osteoarthritis of left knee 12/30/2018  . Anxiety 12/30/2018  . Stage 3 chronic kidney disease 06/28/2016  . Hypercholesterolemia 08/20/2015  . Type 2 diabetes mellitus ([place]) 02/28/2014  . Esophageal reflux 02/28/2014  . Stress incontinence, female 02/28/2014  . Hypothyroidism (acquired) 02/28/2014  . Essential hypertension 11/17/2011    Past Surgical History:  Procedure Laterality Date  . CHOLECYSTECTOMY OPEN  1990's  . COLONOSCOPY  last one 07-17-2014  . CYSTOSCOPY WITH INJECTION N/A 10/29/2016   Procedure: CYSTOSCOPY WITH INJECTION BOTOX 100 UNITS, urethral dilation;  Surgeon: Carolan Clines, MD;  Location: [facility];  Service: Urology;  Laterality: N/A;  . ESOPHAGOGASTRODUODENOSCOPY  last one 07-01-2016  . EXCISIONAL BREAST BX  1990   BENIGN  . HARDWARE REMOVAL Right  03/05/2020   Procedure: HARDWARE REMOVAL RIGHT ANKLE;  Surgeon: Leandrew Koyanagi, MD;  Location: [facility];  Service: Orthopedics;  Laterality: Right;  . ORIF TIBIA & FIBULA FRACTURES  2010   retained rod  . TONSILLECTOMY  age 33  . TOTAL KNEE ARTHROPLASTY Left 01/15/2019   Procedure: LEFT TOTAL KNEE ARTHROPLASTY;  Surgeon: Leandrew Koyanagi, MD;  Location: [facility];  Service: Orthopedics;  Laterality: Left;  Marland Kitchen VAGINAL HYSTERECTOMY   1979  . VENTRAL HERNIA REPAIR  57's     OB History     Gravida  2   Para  2   Term      Preterm      AB      Living  2      SAB      IAB      Ectopic      Multiple      Live Births              Family History  Problem Relation Age of Onset  . Breast cancer Mother 50  . Cancer Father        ? type  . Breast cancer Sister 64  . Breast cancer Maternal Grandmother        ? age    Social History   Tobacco Use  . Smoking status: Never  . Smokeless tobacco: Never  Vaping Use  . Vaping Use: Never used  Substance Use Topics  . Alcohol use: No  . Drug use: No    Home Medications Prior to Admission medications   Medication Sig Start Date End Date Taking? Authorizing Provider  amLODipine (NORVASC) 5 MG tablet Take 5 mg by mouth daily. 05/03/19  Yes [provider]  Calcium Carb-Cholecalciferol (CALCIUM 600/VITAMIN D3) 600-800 MG-UNIT TABS Take 1 tablet by mouth daily.    Yes [provider]  ezetimibe (ZETIA) 10 MG tablet TAKE 1 TABLET BY MOUTH EVERY DAY IN THE EVENING 08/18/20  Yes Cato Mulligan, MD  HYDROcodone-acetaminophen (NORCO/VICODIN) 5-325 MG tablet Take 1 tablet by mouth every 6 (six) hours as needed for severe pain. 12/02/20  Yes Aberman, Caroline C, PA-C  insulin detemir (LEVEMIR FLEXTOUCH) 100 UNIT/ML FlexPen Inject 15 Units into the skin daily. 08/18/20  Yes Cato Mulligan, MD  levothyroxine (SYNTHROID) 137 MCG tablet Take 1 tablet (137 mcg total) by mouth daily before breakfast. Patient taking differently: Take 137 mcg by mouth daily before breakfast. Brand name only 09/16/20  Yes Maudie Mercury, MD  lidocaine (LIDODERM) 5 % PLACE 1 PATCH ONTO THE SKIN DAILY. REMOVE & DISCARD PATCH WITHIN 12 HOURS OR AS DIRECTED BY MD 10/27/20  Yes A[place], [name] March, MD  NOVOLOG FLEXPEN 100 UNIT/ML FlexPen Inject 2-6 Units into the skin 3 (three) times daily with meals. Sliding scale 05/12/20  Yes [provider]  ondansetron  (ZOFRAN) 4 MG tablet Take 1 tablet (4 mg total) by mouth 2 (two) times daily. 09/05/20  Yes Sid Falcon, MD  pantoprazole (PROTONIX) 40 MG tablet TAKE 1 TABLET BY MOUTH EVERY DAY 07/18/20  Yes Lacinda Axon, MD  pregabalin (LYRICA) 75 MG capsule Take 1 capsule (75 mg total) by mouth 3 (three) times daily. 12/16/20  Yes Maudie Mercury, MD  sertraline (ZOLOFT) 100 MG tablet TAKE 2 TABLETS BY MOUTH EVERY DAY Patient taking differently: Take 200 mg by mouth daily. 11/15/20  Yes Asencion Noble, MD  simvastatin (ZOCOR) 40 MG tablet Take 40 mg by mouth daily. 02/11/17  Yes [provider]  tiZANidine (ZANAFLEX) 4 MG tablet Take 1 tablet (4 mg total) by mouth in the morning and at bedtime. 10/23/20 01/21/21 Yes Dellia Cloud, MD  VITAMIN E PO Take 4,000 Units by mouth 2 (two) times daily.   Yes [provider]  acetaminophen (TYLENOL) 500 MG tablet Take 1,000 mg by mouth in the morning and at bedtime.    [provider]  Blood Glucose Monitoring Suppl (ONETOUCH VERIO REFLECT) w/Device KIT Check blood sugar 3 times a day 03/14/20   Dolan Amen, MD  glucose blood [facility] VERIO) test strip Check blood sugar 3 times a day 03/14/20   Dolan Amen, MD  Insulin Pen Needle (B-D ULTRAFINE III SHORT PEN) 31G X 8 MM MISC 1 each by Does not apply route daily. Diagnosis code: E11.9. Used to inject insulin daily. 02/20/20   Claudean Severance, MD  OneTouch Delica Lancets 33G MISC Check blood sugar 3 times a day 03/14/20   Dolan Amen, MD  polyethylene glycol (MIRALAX) 17 g packet Take 17 g by mouth daily. Patient not taking: No sig reported 06/05/20   Claudean Severance, MD    Allergies    Bactrim [sulfamethoxazole-trimethoprim], Cephalexin, Gabapentin, Morphine and related, Vioxx [rofecoxib], and Penicillins  Review of Systems   Review of Systems  Constitutional:  Negative for chills and fever.  HENT:  Negative for congestion.   Respiratory:  Negative for shortness of  breath.   Cardiovascular:  Negative for chest pain.  Gastrointestinal:  Positive for abdominal pain. Negative for diarrhea, nausea and vomiting.  Genitourinary:  Positive for difficulty urinating. Negative for dyspareunia, dysuria, enuresis, flank pain, vaginal bleeding and vaginal discharge.  Musculoskeletal:  Negative for back pain.  Skin:  Negative for rash.  Neurological:  Negative for headaches.  Hematological:  Does not bruise/bleed easily.   Physical Exam Updated Vital Signs BP (!) 154/60   Pulse 74   Temp 98 F (36.7 C) (Oral)   Resp 17   Ht 5\' 9"  (1.753 m)   Wt 68 kg   SpO2 100%   BMI 22.15 kg/m   Physical Exam Vitals and nursing note reviewed.  Constitutional:      General: She is not in acute distress.    Appearance: She is not ill-appearing.  HENT:     Head: Normocephalic and atraumatic.     Nose: No congestion.  Eyes:     Conjunctiva/sclera: Conjunctivae normal.  Cardiovascular:     Rate and Rhythm: Normal rate and regular rhythm.     Pulses: Normal pulses.     Heart sounds: No murmur heard.   No friction rub. No gallop.  Pulmonary:     Effort: No respiratory distress.     Breath sounds: No wheezing, rhonchi or rales.  Abdominal:     General: There is no distension.     Palpations: Abdomen is soft.     Tenderness: There is abdominal tenderness.     Comments: Abdomen was visualized, is nondistended, normative bowel sounds, dull to percussion, she is tender to palpation in her lower abdomen, negative Murphy sign, negative guarding, rebound tenderness, peritoneal sign, she had no McBurney point.  Musculoskeletal:     Right lower leg: No edema.     Left lower leg: No edema.  Skin:    General: Skin is warm and dry.  Neurological:     Mental Status: She is alert.  Psychiatric:        Mood and Affect: Mood normal.  ED Results / Procedures / Treatments   Labs (all labs ordered are listed, but only abnormal results are displayed) Labs Reviewed   COMPREHENSIVE METABOLIC PANEL - Abnormal; Notable for the following components:      Result Value   Glucose, Bld 278 (*)    BUN 46 (*)    Creatinine, Ser 1.32 (*)    GFR, Estimated 43 (*)    All other components within normal limits  URINALYSIS, ROUTINE W REFLEX MICROSCOPIC - Abnormal; Notable for the following components:   Glucose, UA >=500 (*)    Hgb urine dipstick SMALL (*)    All other components within normal limits  CBC WITH DIFFERENTIAL/PLATELET    EKG None  Radiology CT ABDOMEN PELVIS WO CONTRAST  Result Date: 12/17/2020 CLINICAL DATA:  Low back pain with difficulty urinating starting yesterday. Abdominal distention. EXAM: CT ABDOMEN AND PELVIS WITHOUT CONTRAST TECHNIQUE: Multidetector CT imaging of the abdomen and pelvis was performed following the standard protocol without IV contrast. COMPARISON:  11/21/2020 FINDINGS: Lower chest: Mild fibrosis and bronchiectasis in the lung bases. Mild cardiac enlargement. Hepatobiliary: No focal liver abnormality is seen. Status post cholecystectomy. No biliary dilatation. Pancreas: Unremarkable. No pancreatic ductal dilatation or surrounding inflammatory changes. Spleen: Normal in size without focal abnormality. Adrenals/Urinary Tract: Left adrenal gland nodule measuring 2.1 cm diameter. Density measurements are consistent with benign fat containing adenoma. Multiple bilateral renal cysts are identified, some with hyperattenuation suggesting hemorrhagic cysts. Multiple calcifications in both kidneys consistent with small stones. Some calcifications appear to be dystrophic rather than calculi. No hydronephrosis or hydroureter. No ureteral stones. Bladder is decompressed with a Foley catheter. Stomach/Bowel: Stomach, small bowel, and colon are not abnormally distended. Scattered stool throughout the colon. No wall thickening or inflammatory changes. Appendix is normal. Vascular/Lymphatic: Aortic atherosclerosis. No enlarged abdominal or pelvic  lymph nodes. Reproductive: Status post hysterectomy. No adnexal masses. Other: No abdominal wall hernia or abnormality. No abdominopelvic ascites. Musculoskeletal: Degenerative changes in the spine and hips. No destructive bone lesions. IMPRESSION: 1. No acute process demonstrated in the abdomen or pelvis. No evidence of bowel obstruction or inflammation. 2. Left adrenal gland nodule appears benign and is stable since prior study. 3. Multiple bilateral intrarenal stones and dystrophic calcifications. Multiple bilateral renal cysts. No change. 4. Aortic atherosclerosis. 5. Mild fibrosis and bronchiectasis in the lung bases. 6. Foley catheter decompresses the bladder. Electronically Signed   By: Lucienne Capers M.D.   On: 12/17/2020 17:46    Procedures Procedures   Medications Ordered in ED Medications  sodium chloride 0.9 % bolus 1,000 mL (0 mLs Intravenous Stopped 12/17/20 1632)    ED Course  I have reviewed the triage vital signs and the nursing notes.  Pertinent labs & imaging results that were available during my care of the patient were reviewed by me and considered in my medical decision making (see chart for details).    MDM Rules/Calculators/A&P                         Initial impression-patient presents with difficulty urinate.  She is alert, does not appear in acute distress, vital signs reassuring.  Will obtain basic lab work-up, provide patient fluids, obtain bladder scan and reassess.  Work-up-CBC unremarkable, CMP shows slight hyperglycemia 278, increased BUN of 46, creatinine 1.32.  UA negative for nitrates, leukocytes, hematuria or bacteria.  CT abdomen pelvis reveals no acute abnormalities, left adrenal gland nodule appears to be benign, multiple bilateral intrarenal stones multiple  bilateral renal cysts multiple fibrosis brought atelectasis in the lung base  Reassessment-bladder scan reveals 87 mL of fluid in the bladder.  Patient reassessed after a liter of fluids, she  continues to be unable to void, still has tenderness to palpation her lower abdomen.  Bladder scan performed shows 318 mL of fluids, concern for possible postrenal blockage.  Will order Foley cath, and send patient on for CT abdomen pelvis for further evaluation.  Patient was reassessed after Foley catheter replaced drained out 500 mL of fluid.  Patient states she feels better, has no other complaints at this time.  Updated on lab work and imaging patient is agreement for discharge.  Rule out-low suspicion for UTI, pyelonephritis, kidney stones as there is no noted stone seen on CT imaging, UA negative for nitrates, leukocytes, hematuria.  Low suspicion for diverticulitis as there is no diverticulosis seen on CT abdomen pelvis, no  leukocytosis seen on CBC.  Low suspicion for AAA patient has no history of this no bulging mass present on exam.  Plan-  Lower abdominal pain resolved-suspect this secondary due to postrenal bladder obstruction, will leave Foley catheter in place, refer her to urology for further recommendations.  Vital signs have remained stable, no indication for hospital admission.  Patient discussed with attending and they agreed with assessment and plan.  Patient given at home care as well strict return precautions.  Patient verbalized that they understood agreed to said plan.  Final Clinical Impression(s) / ED Diagnoses Final diagnoses:  Urinary retention    Rx / DC Orders ED Discharge Orders          Ordered    Ambulatory referral to Urology        12/17/20 1821             Aron Baba 12/17/20 1823    Long, Wonda Olds, MD 12/22/20 [id number]

## 2020-12-17 NOTE — Progress Notes (Signed)
   CC: Foot Pain, Thyroid, Diabetes Follow Up  HPI:  [name] is a 75 y.o. F, with a PMH noted below, who presents to the clinic for follow up on her foot pain, diabetes, and thyroid. To see the management of their acute and chronic conditions, please see the A&P note under the Encounters tab.   Past Medical History:  Diagnosis Date   Bilateral lower extremity edema    right > left   Bipolar 1 disorder (HCC)    CKD (chronic kidney disease) stage 3, GFR 30-59 ml/min (HCC)     Dr Lawson Radar, [facility]   Diabetes, polyneuropathy ([place])    FEET AND FINGER TIPS   Fibromyalgia    GERD (gastroesophageal reflux disease)    History of chronic gastritis    History of COVID-24 Aug 2019   Hyperlipidemia    Hypertension    per pt was take off bp medication because of kidney disease told by her nephrologist   Hypothyroidism    Mixed incontinence urge and stress    OA (osteoarthritis)    KNEES   OAB (overactive bladder)    PONV (postoperative nausea and vomiting)    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   Type 2 diabetes mellitus treated with insulin ([place])    Wears glasses    Review of Systems:   Review of Systems  Constitutional:  Negative for chills, fever, malaise/fatigue and weight loss.  Cardiovascular:  Negative for chest pain, palpitations, orthopnea, claudication and leg swelling.  Gastrointestinal:  Negative for abdominal pain, constipation, diarrhea, nausea and vomiting.  Musculoskeletal:  Positive for back pain. Negative for falls, joint pain, myalgias and neck pain.       Foot pain  Neurological:  Negative for dizziness, tingling and headaches.    Physical Exam:  Vitals:   12/16/20 1008  BP: 109/63  Pulse: 79  Temp: 97.8 F (36.6 C)  TempSrc: Oral  SpO2: 99%  Height: 5\' 9"  (1.753 m)   Physical Exam Constitutional:      General: She is not in acute distress.    Appearance: She is not ill-appearing, toxic-appearing or diaphoretic.   Cardiovascular:     Rate and Rhythm: Normal rate and regular rhythm.     Pulses: Normal pulses.     Heart sounds: Normal heart sounds. No murmur heard.   No friction rub. No gallop.  Pulmonary:     Effort: Pulmonary effort is normal.     Breath sounds: Normal breath sounds. No wheezing, rhonchi or rales.  Abdominal:     General: Abdomen is flat. Bowel sounds are normal.     Palpations: Abdomen is soft.     Tenderness: There is no abdominal tenderness. There is no guarding.  Musculoskeletal:     Comments: R foot spastic in appearance, great toe through the fourth digit flexed, non tender to palpation.  Neurological:     Mental Status: She is alert.     Assessment & Plan:   See Encounters Tab for problem based charting.  Patient discussed with Dr. Evette Doffing

## 2020-12-17 NOTE — Assessment & Plan Note (Signed)
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 She underwent an extensive work up by neurology, which returned negative. She continues to have pain in her leg and foot, and has noted that her foot constantly spasms. She also notes that she is mostly bed bound, and is unable to make it to the bathroom in time due to her condition and urinates and stools on herself. Received paperwork for incontinent program which has been signed. Will also place DME for bedside commode.    She was referred to PM&R, but did not make the appointment until this month to be evaluated. She has noted that the pain in the foot and spasms are constant. She is on Lyrica 50 mg TID and her muscle relaxant was changed to Zanaflex 4 mg BID.   She has canceled a follow up Neurology appointment, and is no longer being seen by physical therapy at Ms. Kerstetter request. Discussed continuing follow up with her neurologist, and to go to her PM&R appointment as her symptoms could be due to possible complex regional pain syndrome or diabetic lumbosacral plexopathy. Patient voiced understanding. Given her continued complaints and the spasms in her leg and foot, will increase Lyrica today.  - F/U with PMR - F/U with Neurology  - Increase Lyrica to 75mg  TID - Continue Tizanidine 4 mg BID

## 2020-12-17 NOTE — Discharge Instructions (Addendum)
Lab work and imaging were reassuring.  I suspect you have urinary retention.  I have placed a Foley catheter in please leave in.  Please continue all home medications as prescribed.  I like you to follow-up with urology for further evaluation given contact information above please call  Come back to the emergency department if you develop chest pain, shortness of breath, severe abdominal pain, uncontrolled nausea, vomiting, diarrhea.

## 2020-12-18 NOTE — Progress Notes (Signed)
Internal Medicine Clinic Attending ? ?Case discussed with Dr. Winters  At the time of the visit.  We reviewed the resident?s history and exam and pertinent patient test results.  I agree with the assessment, diagnosis, and plan of care documented in the resident?s note.  ?

## 2020-12-19 ENCOUNTER — Encounter: Payer: Self-pay | Admitting: *Deleted

## 2020-12-19 LAB — RENAL FUNCTION PANEL
Albumin: 4.1 g/dL (ref 3.7–4.7)
BUN/Creatinine Ratio: 24 (ref 12–28)
BUN: 32 mg/dL — ABNORMAL HIGH (ref 8–27)
CO2: 21 mmol/L (ref 20–29)
Calcium: 9.7 mg/dL (ref 8.7–10.3)
Chloride: 105 mmol/L (ref 96–106)
Creatinine, Ser: 1.34 mg/dL — ABNORMAL HIGH (ref 0.57–1.00)
Glucose: 344 mg/dL — ABNORMAL HIGH (ref 65–99)
Phosphorus: 3.9 mg/dL (ref 3.0–4.3)
Potassium: 4.5 mmol/L (ref 3.5–5.2)
Sodium: 146 mmol/L — ABNORMAL HIGH (ref 134–144)
eGFR: 42 mL/min/{1.73_m2} — ABNORMAL LOW (ref >59)

## 2020-12-19 LAB — SPECIMEN STATUS REPORT

## 2020-12-19 NOTE — Progress Notes (Signed)
12-19-2020 10:15  Faxed a copy of  12/16/2020 Renal Function results to  Dr Memory Argue, [facility], [phone].    Maryan Rued, PBT Clinic Lab

## 2020-12-22 ENCOUNTER — Telehealth: Payer: Self-pay

## 2020-12-22 NOTE — Telephone Encounter (Signed)
Returned call to patient. Has questions about when foley cath will be removed. It was placed on 12/17/20 by [facility] for urinary retention. Patient has appt on 6/30 with Urology in [place]. Advised patient to contact them. Attempted to relay their contact info but she didn't have any way to write it down. States, "Im bedridden." Had no pen or paper nearby. Asked to speak with patient's sister but she was on another call. Patient will google their number and call them today [phone].

## 2020-12-22 NOTE — Telephone Encounter (Signed)
Pt would like to know what should she do with the catheter. Please call pt back.

## 2020-12-23 ENCOUNTER — Ambulatory Visit: Payer: Medicare Other | Admitting: Behavioral Health

## 2020-12-23 DIAGNOSIS — F331 Major depressive disorder, recurrent, moderate: Secondary | ICD-10-CM

## 2020-12-23 DIAGNOSIS — F419 Anxiety disorder, unspecified: Secondary | ICD-10-CM

## 2020-12-23 NOTE — BH Specialist Note (Signed)
Integrated Behavioral Health via Telemedicine Visit  12/23/2020 Toni Hill [id number]  Number of [place] visits: 4/6 Session Start time: 11:30am  Session End time: 12:00pm Total time: 30  Referring Provider: Dr. Gilford Rile, MD Patient/Family location: Pt is home in private [facility] Provider location: [facility] Office All persons participating in visit: Pt & Clinician Types of Service: Individual psychotherapy  I connected with Toni Hill and/or Toni Hill's  self  via  Telephone or Video Enabled Telemedicine Application  (Video is Caregility application) and verified that I am speaking with the correct person using two identifiers. Discussed confidentiality: Yes   I discussed the limitations of telemedicine and the availability of in person appointments.  Discussed there is a possibility of technology failure and discussed alternative modes of communication if that failure occurs.  I discussed that engaging in this telemedicine visit, they consent to the provision of behavioral healthcare and the services will be billed under their insurance.  Patient and/or legal guardian expressed understanding and consented to Telemedicine visit: Yes   Presenting Concerns: Patient and/or family reports the following symptoms/concerns: elevated anx/dep due to recent health status issues  Duration of problem: months; Severity of problem: moderate  Patient and/or Family's Strengths/Protective Factors: Social connections, Social and Emotional competence, and Concrete supports in place (healthy food, safe environments, etc.)  Goals Addressed: Patient will:  Reduce symptoms of: anxiety, depression, and stress   Increase knowledge and/or ability of: coping skills and stress reduction   Demonstrate ability to: Increase healthy adjustment to current life circumstances  Progress towards Goals: Ongoing  Interventions: Interventions utilized:  Supportive Counseling Standardized  Assessments completed:  screeners prn  Patient and/or Family Response: Pt receptive to call today & requests future appt  Assessment: Patient currently experiencing elevated anxiety due to health status changes.   Patient may benefit from cont'd ck-ins for mental health wellness.  Plan: Follow up with behavioral health clinician on : 2-3 wks telehealth for 30 min Behavioral recommendations: None @ this time Referral(s): [place] (In Clinic)  I discussed the assessment and treatment plan with the patient and/or parent/guardian. They were provided an opportunity to ask questions and all were answered. They agreed with the plan and demonstrated an understanding of the instructions.   They were advised to call back or seek an in-person evaluation if the symptoms worsen or if the condition fails to improve as anticipated.  Donnetta Hutching, LMFT

## 2020-12-24 ENCOUNTER — Telehealth: Payer: Self-pay | Admitting: *Deleted

## 2020-12-24 NOTE — Telephone Encounter (Signed)
Patient called back stating that Dr. Jeffie Pollock is in [place] not in [place]. Relayed contact info for [facility]:  [address], [place], [place] [address] Phone: [phone]  Patient wrote number down and repeated it back correctly. She is advised to call them today for next steps with catheter. Dr. Theodis Shove was present for call.

## 2020-12-24 NOTE — Telephone Encounter (Signed)
Completed [facility] (CAP) LOC Request worksheet faxed to [facility] [employer] at [phone]. Fax confirmation receipt received.

## 2020-12-25 ENCOUNTER — Encounter (HOSPITAL_COMMUNITY): Payer: Self-pay | Admitting: Emergency Medicine

## 2020-12-25 ENCOUNTER — Ambulatory Visit (INDEPENDENT_AMBULATORY_CARE_PROVIDER_SITE_OTHER): Payer: Medicare Other | Admitting: Urology

## 2020-12-25 ENCOUNTER — Emergency Department (HOSPITAL_COMMUNITY)
Admission: EM | Admit: 2020-12-25 | Discharge: 2020-12-25 | Disposition: A | Discharge status: Home / Self Care | Payer: Medicare Other | Attending: Emergency Medicine | Admitting: Emergency Medicine

## 2020-12-25 ENCOUNTER — Other Ambulatory Visit: Payer: Self-pay

## 2020-12-25 VITALS — BP 125/74 | HR 83 | Ht 69.0 in | Wt 152.0 lb

## 2020-12-25 DIAGNOSIS — Z96652 Presence of left artificial knee joint: Secondary | ICD-10-CM | POA: Diagnosis not present

## 2020-12-25 DIAGNOSIS — Z794 Long term (current) use of insulin: Secondary | ICD-10-CM | POA: Diagnosis not present

## 2020-12-25 DIAGNOSIS — N3001 Acute cystitis with hematuria: Secondary | ICD-10-CM | POA: Diagnosis not present

## 2020-12-25 DIAGNOSIS — R339 Retention of urine, unspecified: Secondary | ICD-10-CM

## 2020-12-25 DIAGNOSIS — E039 Hypothyroidism, unspecified: Secondary | ICD-10-CM | POA: Insufficient documentation

## 2020-12-25 DIAGNOSIS — E1122 Type 2 diabetes mellitus with diabetic chronic kidney disease: Secondary | ICD-10-CM | POA: Diagnosis not present

## 2020-12-25 DIAGNOSIS — N200 Calculus of kidney: Secondary | ICD-10-CM

## 2020-12-25 DIAGNOSIS — I129 Hypertensive chronic kidney disease with stage 1 through stage 4 chronic kidney disease, or unspecified chronic kidney disease: Secondary | ICD-10-CM | POA: Insufficient documentation

## 2020-12-25 DIAGNOSIS — N3281 Overactive bladder: Secondary | ICD-10-CM

## 2020-12-25 DIAGNOSIS — Z8616 Personal history of COVID-19: Secondary | ICD-10-CM | POA: Diagnosis not present

## 2020-12-25 DIAGNOSIS — R1030 Lower abdominal pain, unspecified: Secondary | ICD-10-CM | POA: Diagnosis present

## 2020-12-25 DIAGNOSIS — N183 Chronic kidney disease, stage 3 unspecified: Secondary | ICD-10-CM | POA: Diagnosis not present

## 2020-12-25 DIAGNOSIS — Z79899 Other long term (current) drug therapy: Secondary | ICD-10-CM | POA: Insufficient documentation

## 2020-12-25 LAB — COMPREHENSIVE METABOLIC PANEL WITH GFR
ALT: 12 U/L (ref 0–44)
AST: 14 U/L — ABNORMAL LOW (ref 15–41)
Albumin: 3.7 g/dL (ref 3.5–5.0)
Alkaline Phosphatase: 63 U/L (ref 38–126)
Anion gap: 8 (ref 5–15)
BUN: 40 mg/dL — ABNORMAL HIGH (ref 8–23)
CO2: 29 mmol/L (ref 22–32)
Calcium: 9.6 mg/dL (ref 8.9–10.3)
Chloride: 103 mmol/L (ref 98–111)
Creatinine, Ser: 1.19 mg/dL — ABNORMAL HIGH (ref 0.44–1.00)
GFR, Estimated: 48 mL/min — ABNORMAL LOW (ref >60)
Glucose, Bld: 221 mg/dL — ABNORMAL HIGH (ref 70–99)
Potassium: 4.4 mmol/L (ref 3.5–5.1)
Sodium: 140 mmol/L (ref 135–145)
Total Bilirubin: 0.5 mg/dL (ref 0.3–1.2)
Total Protein: 6.5 g/dL (ref 6.5–8.1)

## 2020-12-25 LAB — CBC WITH DIFFERENTIAL/PLATELET
Abs Immature Granulocytes: 0.03 K/uL (ref 0.00–0.07)
Basophils Absolute: 0 K/uL (ref 0.0–0.1)
Basophils Relative: 0 %
Eosinophils Absolute: 0 K/uL (ref 0.0–0.5)
Eosinophils Relative: 0 %
HCT: 40.4 % (ref 36.0–46.0)
Hemoglobin: 13 g/dL (ref 12.0–15.0)
Immature Granulocytes: 0 %
Lymphocytes Relative: 22 %
Lymphs Abs: 1.7 K/uL (ref 0.7–4.0)
MCH: 31.6 pg (ref 26.0–34.0)
MCHC: 32.2 g/dL (ref 30.0–36.0)
MCV: 98.3 fL (ref 80.0–100.0)
Monocytes Absolute: 0.7 K/uL (ref 0.1–1.0)
Monocytes Relative: 9 %
Neutro Abs: 5.4 K/uL (ref 1.7–7.7)
Neutrophils Relative %: 69 %
Platelets: 253 K/uL (ref 150–400)
RBC: 4.11 MIL/uL (ref 3.87–5.11)
RDW: 12.3 % (ref 11.5–15.5)
WBC: 7.8 K/uL (ref 4.0–10.5)
nRBC: 0 % (ref 0.0–0.2)

## 2020-12-25 LAB — URINALYSIS, ROUTINE W REFLEX MICROSCOPIC
Bilirubin Urine: NEGATIVE
Glucose, UA: NEGATIVE mg/dL
Ketones, ur: NEGATIVE mg/dL
Nitrite: NEGATIVE
Protein, ur: NEGATIVE mg/dL
Specific Gravity, Urine: 1.023 (ref 1.005–1.030)
pH: 5 (ref 5.0–8.0)

## 2020-12-25 MED ORDER — CEPHALEXIN 500 MG PO CAPS
500.0000 mg | ORAL_CAPSULE | Freq: Once | ORAL | Status: AC
  Administered 2020-12-25: 500 mg via ORAL
  Filled 2020-12-25: qty 1

## 2020-12-25 MED ORDER — CEPHALEXIN 500 MG PO CAPS: 500.0000 mg | ORAL_CAPSULE | Freq: Four times a day (QID) | ORAL | 0 refills | Status: DC

## 2020-12-25 NOTE — Discharge Instructions (Addendum)
Your urine test today shows that you have a urinary tract infection.  You have been prescribed cephalexin for this infection.  If this medication causes itching, you may take over-the-counter Benadryl 30 minutes prior to taking the antibiotic.  If you develop hives, swelling of your face lips or tongue or difficulty breathing, stop the antibiotic immediately and return to the emergency department.  Follow-up with your primary care provider for recheck of your urine in 1 week.  Also return to the emergency department if you continue to have difficulty voiding.

## 2020-12-25 NOTE — ED Provider Notes (Signed)
[facility] EMERGENCY DEPARTMENT Provider Note   CSN: [id number] Arrival date & time: 12/25/20  1527     History Chief Complaint  Patient presents with   Abdominal Pain    Toni Hill is a 75 y.o. female.   Abdominal Pain Associated symptoms: no chest pain, no chills, no cough, no dysuria, no fatigue, no fever, no hematuria, no nausea, no shortness of breath and no vomiting       Toni Hill is a 75 y.o. 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 she presents to the Emergency Department complaining of suprapubic pain and she is concerned she has a UTI.  She was seen and treated here on 12/17/2020 for urinary retention.  She had a Foley catheter placed.  She was seen by urology earlier today and the Foley catheter was removed.  She complains of persistent pain while the catheter was in place, but pain now worse since catheter was removed.  She states that she has had a urinary tract infection before and current pain feels similar.  She denies any pain to her upper abdomen, fever, chills, flank pain, nausea or vomiting.  Past Medical History:  Diagnosis Date   Bilateral lower extremity edema    right > left   Bipolar 1 disorder (HCC)    CKD (chronic kidney disease) stage 3, GFR 30-59 ml/min (HCC)     Dr Lawson Radar, [facility]   Diabetes, polyneuropathy ([place])    FEET AND FINGER TIPS   Fibromyalgia    GERD (gastroesophageal reflux disease)    History of chronic gastritis    History of COVID-24 Aug 2019   Hyperlipidemia    Hypertension    per pt was take off bp medication because of kidney disease told by her nephrologist   Hypothyroidism    Mixed incontinence urge and stress    OA (osteoarthritis)    KNEES   OAB (overactive bladder)    PONV (postoperative nausea and vomiting)    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   Type 2 diabetes mellitus treated with insulin  ([place])    Wears glasses     Patient Active Problem List   Diagnosis Date Noted   Right leg weakness 06/12/2020   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    Constipation 06/06/2020   Deep vein thrombosis (DVT) of proximal vein of right lower extremity ([place]) 05/26/2020   Protein-calorie malnutrition, severe 01/04/2020   Intractable nausea and vomiting 01/02/2020   Nausea & vomiting 01/01/2020   Preoperative evaluation to rule out surgical contraindication 11/19/2019   Bilateral low back pain with sciatica 09/06/2019   Peripheral neuropathy 09/06/2019   Seborrheic keratoses 08/20/2019   Left breast mass 08/20/2019   Macrocytic anemia 03/08/2019   Leg swelling 01/25/2019   QT prolongation 01/25/2019   At risk for adverse drug event 01/22/2019   Bipolar 1 disorder ([place])    Osteoarthritis of left knee 12/30/2018   Anxiety 12/30/2018   Stage 3 chronic kidney disease 06/28/2016   Hypercholesterolemia 08/20/2015   Type 2 diabetes mellitus ([place]) 02/28/2014   Esophageal reflux 02/28/2014   Stress incontinence, female 02/28/2014   Hypothyroidism (acquired) 02/28/2014   Essential hypertension 11/17/2011    Past Surgical History:  Procedure Laterality Date   CHOLECYSTECTOMY OPEN  1990's   COLONOSCOPY  last one 07-17-2014   CYSTOSCOPY WITH INJECTION N/A  10/29/2016   Procedure: CYSTOSCOPY WITH INJECTION BOTOX 100 UNITS, urethral dilation;  Surgeon: Sigmund Tannenbaum, MD;  Location: [facility];  Service: Urology;  Laterality: N/A;   ESOPHAGOGASTRODUODENOSCOPY  last one 07-01-2016   EXCISIONAL BREAST BX  1990   BENIGN   HARDWARE REMOVAL Right 03/05/2020   Procedure: HARDWARE REMOVAL RIGHT ANKLE;  Surgeon: Xu, Naiping M, MD;  Location: [facility];  Service: Orthopedics;  Laterality: Right;   ORIF TIBIA & FIBULA FRACTURES  2010   retained rod   TONSILLECTOMY  age 6   TOTAL KNEE ARTHROPLASTY Left 01/15/2019   Procedure: LEFT TOTAL  KNEE ARTHROPLASTY;  Surgeon: Xu, Naiping M, MD;  Location: [facility] OR;  Service: Orthopedics;  Laterality: Left;   VAGINAL HYSTERECTOMY  1979   VENTRAL HERNIA REPAIR  1990's     OB History     Gravida  2   Para  2   Term      Preterm      AB      Living  2      SAB      IAB      Ectopic      Multiple      Live Births              Family History  Problem Relation Age of Onset   Breast cancer Mother 65   Cancer Father        ? type   Breast cancer Sister 60   Breast cancer Maternal Grandmother        ? age    Social History   Tobacco Use   Smoking status: Never   Smokeless tobacco: Never  Vaping Use   Vaping Use: Never used  Substance Use Topics   Alcohol use: No   Drug use: No    Home Medications Prior to Admission medications   Medication Sig Start Date End Date Taking? Authorizing Provider  acetaminophen (TYLENOL) 500 MG tablet Take 1,000 mg by mouth in the morning and at bedtime.    [provider]  amLODipine (NORVASC) 5 MG tablet Take 5 mg by mouth daily. 05/03/19   [provider]  Blood Glucose Monitoring Suppl (ONETOUCH VERIO REFLECT) w/Device KIT Check blood sugar 3 times a day 03/14/20   Winters, Steven, MD  Calcium Carb-Cholecalciferol (CALCIUM 600/VITAMIN D3) 600-800 MG-UNIT TABS Take 1 tablet by mouth daily.     [provider]  ezetimibe (ZETIA) 10 MG tablet TAKE 1 TABLET BY MOUTH EVERY DAY IN THE EVENING 08/18/20   Johnson, Matthew, MD  glucose blood (ONETOUCH VERIO) test strip Check blood sugar 3 times a day 03/14/20   Winters, Steven, MD  HYDROcodone-acetaminophen (NORCO/VICODIN) 5-325 MG tablet Take 1 tablet by mouth every 6 (six) hours as needed for severe pain. 12/02/20   Aberman, Caroline C, PA-C  insulin detemir (LEVEMIR FLEXTOUCH) 100 UNIT/ML FlexPen Inject 15 Units into the skin daily. 08/18/20   Johnson, Matthew, MD  Insulin Pen Needle (B-D ULTRAFINE III SHORT PEN) 31G X 8 MM MISC 1 each by Does not apply  route daily. Diagnosis code: E11.9. Used to inject insulin daily. 02/20/20   Krienke, Marissa M, MD  levothyroxine (SYNTHROID) 137 MCG tablet Take 1 tablet (137 mcg total) by mouth daily before breakfast. Patient taking differently: Take 137 mcg by mouth daily before breakfast. Brand name only 09/16/20   Winters, Steven, MD  lidocaine (LIDODERM) 5 % PLACE 1 PATCH ONTO THE SKIN DAILY. REMOVE &   DISCARD PATCH WITHIN 12 HOURS OR AS DIRECTED BY MD 10/27/20   Lacinda Axon, MD  NOVOLOG FLEXPEN 100 UNIT/ML FlexPen Inject 2-6 Units into the skin 3 (three) times daily with meals. Sliding scale 05/12/20   [provider]  ondansetron (ZOFRAN) 4 MG tablet Take 1 tablet (4 mg total) by mouth 2 (two) times daily. 09/05/20   Sid Falcon, MD  OneTouch Delica Lancets 63W MISC Check blood sugar 3 times a day 03/14/20   Maudie Mercury, MD  pantoprazole (PROTONIX) 40 MG tablet TAKE 1 TABLET BY MOUTH EVERY DAY 07/18/20   Lacinda Axon, MD  polyethylene glycol (MIRALAX) 17 g packet Take 17 g by mouth daily. 06/05/20   Asencion Noble, MD  pregabalin (LYRICA) 75 MG capsule Take 1 capsule (75 mg total) by mouth 3 (three) times daily. 12/16/20   Maudie Mercury, MD  sertraline (ZOLOFT) 100 MG tablet TAKE 2 TABLETS BY MOUTH EVERY DAY Patient taking differently: Take 200 mg by mouth daily. 11/15/20   Asencion Noble, MD  simvastatin (ZOCOR) 40 MG tablet Take 40 mg by mouth daily. 02/11/17   [provider]  tiZANidine (ZANAFLEX) 4 MG tablet Take 1 tablet (4 mg total) by mouth in the morning and at bedtime. 10/23/20 01/21/21  Marianna Payment, MD  VITAMIN E PO Take 4,000 Units by mouth 2 (two) times daily.    [provider]    Allergies    Bactrim [sulfamethoxazole-trimethoprim], Cephalexin, Gabapentin, Morphine and related, Vioxx [rofecoxib], and Penicillins  Review of Systems   Review of Systems  Constitutional:  Negative for chills, fatigue and fever.  Respiratory:  Negative for  cough, shortness of breath and wheezing.   Cardiovascular:  Negative for chest pain and palpitations.  Gastrointestinal:  Positive for abdominal pain. Negative for blood in stool, nausea and vomiting.  Genitourinary:  Positive for difficulty urinating. Negative for dysuria, flank pain and hematuria.  Musculoskeletal:  Negative for arthralgias, back pain, myalgias, neck pain and neck stiffness.  Skin:  Negative for rash.  Neurological:  Negative for dizziness, weakness and numbness.  Hematological:  Does not bruise/bleed easily.   Physical Exam Updated Vital Signs BP (!) 109/54 (BP Location: Right Arm)   Pulse 73   Temp 97.7 F (36.5 C)   Resp 16   Ht 5' 9" (1.753 m)   Wt 68.9 kg   SpO2 99%   BMI 22.45 kg/m   Physical Exam Vitals and nursing note reviewed.  Constitutional:      General: She is not in acute distress.    Appearance: She is well-developed. She is not toxic-appearing.  Cardiovascular:     Rate and Rhythm: Normal rate and regular rhythm.     Pulses: Normal pulses.  Pulmonary:     Effort: Pulmonary effort is normal. No respiratory distress.  Abdominal:     General: There is no distension.     Palpations: Abdomen is soft.     Tenderness: There is abdominal tenderness. There is no right CVA tenderness or left CVA tenderness.     Comments: ttp of the suprapubic area.    Musculoskeletal:        General: No swelling.     Comments: Spastic appearing right foot.  Non tender  Skin:    General: Skin is warm.     Capillary Refill: Capillary refill takes less than 2 seconds.  Neurological:     Mental Status: She is alert and oriented to person, place, and time.  ED Results / Procedures / Treatments   Labs (all labs ordered are listed, but only abnormal results are displayed) Labs Reviewed  COMPREHENSIVE METABOLIC PANEL - Abnormal; Notable for the following components:      Result Value   Glucose, Bld 221 (*)    BUN 40 (*)    Creatinine, Ser 1.19 (*)    AST 14  (*)    GFR, Estimated 48 (*)    All other components within normal limits  URINALYSIS, ROUTINE W REFLEX MICROSCOPIC - Abnormal; Notable for the following components:   APPearance HAZY (*)    Hgb urine dipstick MODERATE (*)    Leukocytes,Ua LARGE (*)    Bacteria, UA RARE (*)    All other components within normal limits  URINE CULTURE  CBC WITH DIFFERENTIAL/PLATELET    EKG None  Radiology No results found.  Procedures Procedures   Medications Ordered in ED Medications - No data to display  ED Course  I have reviewed the triage vital signs and the nursing notes.  Pertinent labs & imaging results that were available during my care of the patient were reviewed by me and considered in my medical decision making (see chart for details).    MDM Rules/Calculators/A&P                          Patient here with concerns of lower abdominal pain and concerned that she may have a UTI.  She was recently treated here for urinary retention and a Foley catheter was placed.  She was seen by urology earlier today and the Foley was removed.  Urinalysis on her prior ER visit was unremarkable.  CT A/P from prior visit showed bilateral infrarenal stones with bilateral renal cysts  On exam, patient is well-appearing.  No acute distress.  She has some mild supra pubic tenderness to palpation.  No CVA tenderness.  Nontoxic-appearing.  We will recheck labs and urinalysis.  Pt feels that she can urinate on her own, although she has not voided since catheter has been removed.    Labs interpreted by me, no leukocytosis, patient does have kidney disease, creatinine 1.19 improved from baseline.  Urinalysis shows moderate hemoglobin, large leukocytes and 21-50 white cells.  This is a change from her previous urinalysis 8 days ago. Urine culture pending.    Pt has allergy to PCN, Cephalexin and Sulfa.  States Cephalexin causes itching only.  Pt agreeable to try.  I have recommended that she take Benadryl 30  minutes prior if needed.  Strict return precautions discussed.    Final Clinical Impression(s) / ED Diagnoses Final diagnoses:  Acute cystitis with hematuria    Rx / DC Orders ED Discharge Orders     None        Bufford Lope 12/25/20 1913    Daleen Bo, MD 12/26/20 1042

## 2020-12-25 NOTE — ED Triage Notes (Signed)
Pt to the ED with lower abdominal pain and c/o a UTI.

## 2020-12-25 NOTE — ED Provider Notes (Signed)
  Face-to-face evaluation   History: She presents for evaluation of lower abdominal pain that has been present for about a week despite having a catheter in.  She was at the urologist office today for removal of the Foley catheter.  She decided to come here because of the abdominal pain.  She probably did not have a urinalysis done today.  She denies fever, chills, vomiting or dizziness.  She has paralysis of her right leg, for the last 8 months.  She does not ambulate.  Physical exam: Alert, elderly female.  She is conversant and calm.  Abdomen is soft but tender in the suprapubic region.  No palpable mass.  Medical screening examination/treatment/procedure(s) were conducted as a shared visit with non-physician practitioner(s) and myself.  I personally evaluated the patient during the encounter    Daleen Bo, MD 12/26/20 1042

## 2020-12-25 NOTE — Progress Notes (Signed)
Catheter Removal ? ?Patient is present today for a catheter removal.  10ml of water was drained from the balloon. A 16FR foley cath was removed from the bladder no complications were noted . Patient tolerated well. ? ?Performed by: Derenda Giddings LPN ? ?Follow up/ Additional notes: Per MD note  ?

## 2020-12-25 NOTE — Progress Notes (Signed)
Subjective: 1. Urinary retention   2. Overactive bladder   3. Renal stones      Toni Hill has been a patient of the [facility] practice for many years with a history of OAB that has been previously treated with BOTOX in 2018 and multiple medications.  She was in the ER on 12/17/20 and was found to be in retention with about 320m in the bladder.  She had CT that showed some small renal stones and complex and simples cysts.   Her UA was unremarkable.   She had  been voiding ok but with OAB prior to the onset of difficulty voiding.  She has long standing diabetes with neuropathy and has had a paralyzed right leg for 8 months and is unable to stand.    She had a foley place and is in today with bladder spasms and pain with the foley.  ROS:  ROS  Allergies  Allergen Reactions   Bactrim [Sulfamethoxazole-Trimethoprim]     Patient experienced nephrotoxicity in the setting of CKD. Strongly recommend against using this antibiotic in the future.    Cephalexin Itching   Gabapentin     Kidney issue   Morphine And Related Nausea And Vomiting   Vioxx [Rofecoxib]     GI bleeding   Penicillins Hives and Itching    Has patient had a PCN reaction causing immediate rash, facial/tongue/throat swelling, SOB or lightheadedness with hypotension: No Has patient had a PCN reaction causing severe rash involving mucus membranes or skin necrosis: No Has patient had a PCN reaction that required hospitalization: no Has patient had a PCN reaction occurring within the last 10 years: No If all of the above answers are "NO", then may proceed with Cephalosporin use.     Past Medical History:  Diagnosis Date   Bilateral lower extremity edema    right > left   Bipolar 1 disorder (HCC)    CKD (chronic kidney disease) stage 3, GFR 30-59 ml/min (HCC)     [facility]   Diabetes[place]neuropathy ([place]    FEET AND FINGER TIPS   Fibromyalgia    GERD (gastroesophageal reflux disease)    History of  chronic gastritis    History of COVID-24 Aug 2019   Hyperlipidemia    Hypertension    per pt was take off bp medication because of kidney disease told by her nephrologist   Hypothyroidism    Mixed incontinence urge and stress    OA (osteoarthritis)    KNEES   OAB (overactive bladder)    PONV (postoperative nausea and vomiting)    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   Type 2 diabetes mellitus treated with insulin ([place]    Wears glasses     Past Surgical History:  Procedure Laterality Date   CHOLECYSTECTOMY OPEN  1990's   COLONOSCOPY  last one 07-17-2014   CYSTOSCOPY WITH INJECTION N/A 10/29/2016   Procedure: CYSTOSCOPY WITH INJECTION BOTOX 100 UNITS, urethral dilation;  Surgeon: SCarolan Clines MD;  [place] [facility]  Service: Urology;  Laterality: N/A;   ESOPHAGOGASTRODUODENOSCOPY  last one 07-01-2016   EXCISIONAL BREAST BX  1990   BENIGN   HARDWARE REMOVAL Right 03/05/2020   Procedure: HARDWARE REMOVAL RIGHT ANKLE;  Surgeon: XLeandrew Koyanagi MD;  [place] [facility]  Service: Orthopedics;  Laterality: Right;   ORIF TIBIA & FIBULA FRACTURES  2010   retained rod   TONSILLECTOMY  age 75  TOTAL KNEE ARTHROPLASTY Left 01/15/2019   Procedure: LEFT TOTAL KNEE ARTHROPLASTY;  Surgeon: Leandrew Koyanagi, MD;  Location: [place];  Service: Orthopedics;  Laterality: Left;   VAGINAL HYSTERECTOMY  1979   VENTRAL HERNIA REPAIR  1990's    Social History   Socioeconomic History   Marital status: Married    Spouse name: Not on file   Number of children: Not on file   Years of education: Not on file   Highest education level: Not on file  Occupational History   Not on file  Tobacco Use   Smoking status: Never   Smokeless tobacco: Never  Vaping Use   Vaping Use: Never used  Substance and Sexual Activity   Alcohol use: No   Drug use: No   Sexual activity: Not Currently  Other Topics Concern   Not on file  Social History Narrative   Not on  file   Social Determinants of Health   Financial Resource Strain: Not on file  Food Insecurity: Not on file  Transportation Needs: Not on file  Physical Activity: Not on file  Stress: Not on file  Social Connections: Not on file  Intimate Partner Violence: Not on file    Family History  Problem Relation Age of Onset   Breast cancer Mother 44   Cancer Father        ? type   Breast cancer Sister 77   Breast cancer Maternal Grandmother        ? age    Anti-infectives: Anti-infectives (From admission, onward)    None       Current Outpatient Medications  Medication Sig Dispense Refill   acetaminophen (TYLENOL) 500 MG tablet Take 1,000 mg by mouth in the morning and at bedtime.     amLODipine (NORVASC) 5 MG tablet Take 5 mg by mouth daily.     Blood Glucose Monitoring Suppl (ONETOUCH VERIO REFLECT) w/Device KIT Check blood sugar 3 times a day 1 kit 0   Calcium Carb-Cholecalciferol (CALCIUM 600/VITAMIN D3) 600-800 MG-UNIT TABS Take 1 tablet by mouth daily.      ezetimibe (ZETIA) 10 MG tablet TAKE 1 TABLET BY MOUTH EVERY DAY IN THE EVENING 90 tablet 1   glucose blood (ONETOUCH VERIO) test strip Check blood sugar 3 times a day 300 each 3   HYDROcodone-acetaminophen (NORCO/VICODIN) 5-325 MG tablet Take 1 tablet by mouth every 6 (six) hours as needed for severe pain. 8 tablet 0   insulin detemir (LEVEMIR FLEXTOUCH) 100 UNIT/ML FlexPen Inject 15 Units into the skin daily. 15 mL 1   Insulin Pen Needle (B-D ULTRAFINE III SHORT PEN) 31G X 8 MM MISC 1 each by Does not apply route daily. Diagnosis code: E11.9. Used to inject insulin daily. 100 each 2   levothyroxine (SYNTHROID) 137 MCG tablet Take 1 tablet (137 mcg total) by mouth daily before breakfast. (Patient taking differently: Take 137 mcg by mouth daily before breakfast. Brand name only) 90 tablet 3   lidocaine (LIDODERM) 5 % PLACE 1 PATCH ONTO THE SKIN DAILY. REMOVE & DISCARD PATCH WITHIN 12 HOURS OR AS DIRECTED BY MD 30 patch 0    NOVOLOG FLEXPEN 100 UNIT/ML FlexPen Inject 2-6 Units into the skin 3 (three) times daily with meals. Sliding scale     ondansetron (ZOFRAN) 4 MG tablet Take 1 tablet (4 mg total) by mouth 2 (two) times daily. 180 tablet 0   OneTouch Delica Lancets 65L MISC Check blood sugar 3 times a day 300 each 3  pantoprazole (PROTONIX) 40 MG tablet TAKE 1 TABLET BY MOUTH EVERY DAY 90 tablet 1   polyethylene glycol (MIRALAX) 17 g packet Take 17 g by mouth daily. 14 each 0   pregabalin (LYRICA) 75 MG capsule Take 1 capsule (75 mg total) by mouth 3 (three) times daily. 180 capsule 1   sertraline (ZOLOFT) 100 MG tablet TAKE 2 TABLETS BY MOUTH EVERY DAY (Patient taking differently: Take 200 mg by mouth daily.) 180 tablet 1   simvastatin (ZOCOR) 40 MG tablet Take 40 mg by mouth daily.     tiZANidine (ZANAFLEX) 4 MG tablet Take 1 tablet (4 mg total) by mouth in the morning and at bedtime. 60 tablet 2   VITAMIN E PO Take 4,000 Units by mouth 2 (two) times daily.     No current facility-administered medications for this visit.     Objective: Vital signs in last 24 hours: BP 125/74   Pulse 83   Ht _0  (1.753 m)   Wt 152 lb (68.9 kg)   BMI 22.45 kg/m   Intake/Output from previous day: No intake/output data recorded. Intake/Output this shift: _1 @   Physical Exam Vitals reviewed.  Constitutional:      Appearance: Normal appearance.     Comments: On stretcher.      Neurological:     Mental Status: She is alert.     Comments: RLE paralysis with contracture.    Lab Results:  No results found for this or any previous visit (from the past 24 hour(s)).  BMET No results for input(s): NA, K, CL, CO2, GLUCOSE, BUN, CREATININE, CALCIUM in the last 72 hours. PT/INR No results for input(s): LABPROT, INR in the last 72 hours. ABG No results for input(s): PHART, HCO3 in the last 72 hours.  Invalid input(s): PCO2, PO2  Studies/Results: Procedure.  Foley catheter removed today.     Assessment/Plan: Urinary retention.   She had urinary retention of uncertain etiology but is not tolerating the foley because of spasms.  I had the foley removed today and will have her return for a PVR in a week and if she is voiding she will return in 3 months.  Renal stones.  She had small non-obstructing stones on CT.   No intervention needed at this time.  OAB.  She has a history of OAB but had been doing ok on no current therapy.      No orders of the defined types were placed in this encounter.    No orders of the defined types were placed in this encounter.    Return in about 1 week (around 01/01/2021) for Needs NV for PVR in a week and then will need f/u with a PVR in 2-3 months. .    CC: Dr. Maudie Mercury.      Irine Seal 12/25/2020 [phone]

## 2020-12-25 NOTE — Progress Notes (Signed)
Urological Symptom Review  Patient is experiencing the following symptoms: Hard to postpone urination Trouble starting stream Weak stream   Review of Systems  Gastrointestinal (upper)  : Negative for upper GI symptoms  Gastrointestinal (lower) : Negative for lower GI symptoms  Constitutional : Negative for symptoms  Skin: Negative for skin symptoms  Eyes: Negative for eye symptoms  Ear/Nose/Throat : Negative for Ear/Nose/Throat symptoms  Hematologic/Lymphatic: Negative for Hematologic/Lymphatic symptoms  Cardiovascular :   Paralyzed right leg   Respiratory : Negative for respiratory symptoms  Endocrine: Negative for endocrine symptoms  Musculoskeletal: Negative for musculoskeletal symptoms  Neurological: Negative for neurological symptoms  Psychologic: Negative for psychiatric symptoms

## 2020-12-26 ENCOUNTER — Ambulatory Visit: Payer: Medicare Other | Admitting: Urology

## 2020-12-26 ENCOUNTER — Telehealth: Payer: Self-pay

## 2020-12-26 NOTE — Telephone Encounter (Signed)
Patient does not want to be a patient of Dr. Jeffie Pollock. She says she has a UTI. She wants to change to another doctor at our practice.  Wanting a call back at [phone] (H)   Please advise.  Thanks, Helene Kelp

## 2020-12-30 NOTE — Telephone Encounter (Signed)
Patient called back.  She is wanting a call back to let her know if she can switch to another urologist doctor in our practice.  Please advise.  Call back:  [phone]   Thanks, Helene Kelp

## 2020-12-31 NOTE — Telephone Encounter (Signed)
Returned call back to patient.  Patient stated she was not feeling well today and return our call at a later date to discuss a future appointment.

## 2021-01-01 ENCOUNTER — Ambulatory Visit: Payer: Medicare Other | Admitting: Urology

## 2021-01-06 ENCOUNTER — Encounter: Payer: Self-pay | Admitting: *Deleted

## 2021-01-08 ENCOUNTER — Telehealth: Payer: Self-pay

## 2021-01-08 NOTE — Telephone Encounter (Signed)
Pt is requesting a call back .. she stated that she is needing to speak to her Dr to see if she can get an MRI to find out what is going on with her leg and that 8 months is to long for her not to know what is going no

## 2021-01-08 NOTE — Telephone Encounter (Signed)
MRI of spine ordered 05/27/20 for patient's right leg pain. Will forward to [place] to see if this can be scheduled.

## 2021-01-09 ENCOUNTER — Ambulatory Visit (INDEPENDENT_AMBULATORY_CARE_PROVIDER_SITE_OTHER): Payer: Medicare Other | Admitting: Student

## 2021-01-09 DIAGNOSIS — F444 Conversion disorder with motor symptom or deficit: Secondary | ICD-10-CM

## 2021-01-09 MED ORDER — PREGABALIN 100 MG PO CAPS
100.0000 mg | ORAL_CAPSULE | Freq: Three times a day (TID) | ORAL | 0 refills | Status: DC
Start: 2021-01-09 — End: 2021-02-13

## 2021-01-09 NOTE — Telephone Encounter (Signed)
Placed on Provider's schedule for tele visit.

## 2021-01-09 NOTE — Progress Notes (Signed)
  [facility] Internal Medicine Residency Telephone Encounter Continuity Care Appointment  HPI:  This telephone encounter was created for [name] on 01/09/2021 for the following purpose/cc chronic right leg/foot pain.   Past Medical History:  Past Medical History:  Diagnosis Date   Bilateral lower extremity edema    right > left   Bipolar 1 disorder (HCC)    CKD (chronic kidney disease) stage 3, GFR 30-59 ml/min (HCC)     Dr Toni Hill, [facility]   Diabetes, polyneuropathy ([place])    FEET AND FINGER TIPS   Fibromyalgia    GERD (gastroesophageal reflux disease)    History of chronic gastritis    History of COVID-24 Aug 2019   Hyperlipidemia    Hypertension    per pt was take off bp medication because of kidney disease told by her nephrologist   Hypothyroidism    Mixed incontinence urge and stress    OA (osteoarthritis)    KNEES   OAB (overactive bladder)    PONV (postoperative nausea and vomiting)    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   Type 2 diabetes mellitus treated with insulin ([place])    Wears glasses      ROS:  As per HPI   Assessment / Plan / Recommendations:  Please see A&P under problem oriented charting for assessment of the patient's acute and chronic medical conditions.  As always, pt is advised that if symptoms worsen or new symptoms arise, they should go to an urgent care facility or to to ER for further evaluation.   Consent and Medical Decision Making:  Patient discussed with Dr. Angelia Mould This is a telephone encounter between [place] on 01/09/2021 for chronic right leg pain. The visit was conducted with the patient located at home and Toni Hill at [facility]. The patient's identity was confirmed using their DOB and current address. The patient has consented to being evaluated through a telephone encounter and understands the associated risks (an examination cannot be done and the patient may need to come  in for an appointment) / benefits (allows the patient to remain at home, decreasing exposure to coronavirus). I personally spent 17 minutes on medical discussion.

## 2021-01-11 NOTE — Assessment & Plan Note (Signed)
Ms. Garrow presents via telephone to discuss ongoing chronic right lower extremity pain and weakness. Per chart review, she has had an extensive work-up for this issue, including MRI, CT, x-rays, and neurology referral, all of which has been negative. Today she endorses frustration over this ongoing issue. Mentions that this has been occurring for eight months and she has not had any relief. Her symptoms of weakness and right lower extremity pain has progressively worsened over this time. Currently she is largely bed bound due to this and lives with her sister who helps with her ADL's. States that pregabalin has not helped her pain, but she is also unsure of what other medications she has been taking. She denies any acute changes in her symptoms or new neurologic symptoms. She is tearful when discussing future prognosis, noting that she is afraid she will never recover from this.  I discussed with Ms. Sitton that it is difficult to accurately assess her, especially given I have not previously talked to her or seen her in person. Further discussed that following up with PM&R likely will be the next best step given the resources we can provide here in the clinic are limited after her extensive work-up thus far. I have given Ms. Ambrocio the time and date of her upcoming appointment with PM&R along with the phone number and address of the office. Per her request, I have also sent her a letter with this information along with information regarding her upcoming appointment with Dr. Theodis Shove. Of note, Ms. Griffy mentions that she has difficult leaving the house as an ambulance has to pick her up and take her downstairs on a stretcher. Encouraged Ms. Glassner that evaluating her in person with PM&R will be extremely important. She verbalized understanding.  For her symptom control I discussed with Ms. Tabor that I do not feel a short course of opioid medication would ultimately help her in the long run. I also  explained that I do not feel comfortable adjusting many of her medications given she has been established and is well-known to her primary care physician. I did offer to increase the pregabalin until she can see PM&R. In the future, she possibly could benefit from pain clinic referral, although this could prove difficult given her transportation issues.  - Increase pregabalin 100mg  three times daily - Follow-up with PM&R

## 2021-01-12 ENCOUNTER — Telehealth: Payer: Self-pay | Admitting: Dietician

## 2021-01-12 NOTE — Telephone Encounter (Signed)
She cannot find her reader. When I called she had found it. Advised her that her insurance may only cover a new reader every 3 - 5 years. She can call her CGM supplier.

## 2021-01-14 ENCOUNTER — Other Ambulatory Visit: Payer: Self-pay | Admitting: Student

## 2021-01-14 ENCOUNTER — Ambulatory Visit: Payer: Medicare Other | Admitting: Behavioral Health

## 2021-01-14 ENCOUNTER — Ambulatory Visit: Payer: Self-pay | Admitting: Licensed Clinical Social Worker

## 2021-01-14 DIAGNOSIS — F331 Major depressive disorder, recurrent, moderate: Secondary | ICD-10-CM

## 2021-01-14 DIAGNOSIS — F319 Bipolar disorder, unspecified: Secondary | ICD-10-CM

## 2021-01-14 DIAGNOSIS — R29818 Other symptoms and signs involving the nervous system: Secondary | ICD-10-CM

## 2021-01-14 DIAGNOSIS — F419 Anxiety disorder, unspecified: Secondary | ICD-10-CM

## 2021-01-14 NOTE — Chronic Care Management (AMB) (Signed)
  Care Management   Social Work Visit Note  01/14/2021 Name: Toni Hill MRN: [id number] DOB: [birth year]  SW reviewed chart with Donnetta Hutching, LMFT ( therapist). SW requested a referral to be placed to initiate outreach.    Milus Height, [place]  Social Worker [facility] Care Management  [phone]

## 2021-01-14 NOTE — BH Specialist Note (Signed)
Integrated Behavioral Health Follow Up In-Person Visit  MRN: [id number] Name: Toni Hill  Number of [place] Clinician visits: 5/6 Session Start time: 2:00pm  Session End time: 2:50pm Total time: 50 minutes  Types of Service: Individual psychotherapy  Interpretor:No. Interpretor Name and Language: self   Subjective: Toni Hill is a 75 y.o. female accompanied by  self Patient was referred by Dr. Maudie Mercury, MD for mental health wellness & other health status changes. Patient reports the following symptoms/concerns: elevated anx/dep due to health status changes. Duration of problem: 8 mos; Severity of problem: moderate  Objective: Mood: Anxious, Depressed, and acerbic  and Affect:  animated emotionality Risk of harm to self or others: No plan to harm self or others  Life Context: Family and Social: Pt lives w/her 74yo str & her Husb since her health status changes to her R foot since March 2022. Pt moved from the residence she shared w/her Husb during this initial time bc he could not care for her. Husb suffers from [place] has dealt w/this health issue for years. Her children seem to be angry she lft as she reports, "they do not speak to me". Pt has stated for months, "I want a divorce". Pt sts, "I want to figure out my foot problem & get it fixed. I will give up if I cannot & this continues". School/Work: Pt is not employed & does not attend school @ this time.  Self-Care: Pt is limited mobility & is essentially bed-bound for the past 8 mos. She has poor adjustment to this health status change & cannot accept her circumstances w/o much anguish, despair, & emotionality. 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 Pt is a GGM to her Dtr's [place]. Life Changes: Pt is struggling to adjust to health status changes; she is advocating for herself today stating she  needs: -Referral to a Neurologist -Supply of PureWick catheters she can insert herself free from the burden of waiting for older Str to coordinate w/her -medication for a believed UTI due to the above limitations -a possible front door ramp -a possible Home Healthcare Nurse  Is there [place] coverage for any supplies, the ramp, HHCare?  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  Demonstrate ability to: Increase healthy adjustment to current life circumstances, Increase adequate support systems for patient/family, Increase motivation to adhere to plan of care, and Begin healthy grieving over loss if full health & ROM for R foot is unobtainable  Progress towards Goals: Ongoing  Interventions: Interventions utilized:  Solution-Focused Strategies, Supportive Counseling, and Preventative Services/Health Promotion Standardized Assessments completed:  screeners prn  Patient and/or Family Response: Pt receptive to f:f appt today, although it was not scheduled as f:f. Pt has [facility] transport this day & Front Desk assisted in moving scheduled telehealth around for other Pt scheduled @ 2:00pm  Patient Centered Plan: Patient is on the following Treatment Plan(s): Pt will promote her own patience by keeping herself busy in bed. Discussed ways to use her time  when she is bedbound. Assessment: Patient currently experiencing foot pain, discomfort w/her lack of mobility & her poor relations w/family.   Patient may benefit from cont'd support for situation & creative ideas to keep  Pt mentally active.  Plan: Follow up with behavioral health clinician on : 2-3 wks on telehealth for 30 min Behavioral recommendations: Write & Journal to keep track of concerns since memory is a challenge Referral(s): [place] (In Clinic) "From scale of 1-10, how likely are you to follow plan?": [place], LMFT

## 2021-01-14 NOTE — BH Specialist Note (Signed)
, °

## 2021-01-15 ENCOUNTER — Other Ambulatory Visit: Payer: Self-pay | Admitting: Internal Medicine

## 2021-01-15 ENCOUNTER — Telehealth: Payer: Self-pay | Admitting: *Deleted

## 2021-01-15 ENCOUNTER — Other Ambulatory Visit: Payer: Self-pay | Admitting: Student

## 2021-01-15 ENCOUNTER — Ambulatory Visit (INDEPENDENT_AMBULATORY_CARE_PROVIDER_SITE_OTHER): Payer: Medicare Other | Admitting: Student

## 2021-01-15 ENCOUNTER — Other Ambulatory Visit: Payer: Self-pay

## 2021-01-15 DIAGNOSIS — N390 Urinary tract infection, site not specified: Secondary | ICD-10-CM | POA: Diagnosis not present

## 2021-01-15 DIAGNOSIS — F444 Conversion disorder with motor symptom or deficit: Secondary | ICD-10-CM

## 2021-01-15 NOTE — Chronic Care Management (AMB) (Signed)
  Care Management   Note  01/15/2021 Name: Toni Hill MRN: [id number] DOB: [birth year]  Toni Hill is a 75 y.o. year old female who is a primary care patient of Maudie Mercury, MD. I reached out to [name] by phone today in response to a referral sent by Ms. [name]'s PCP, Dr. Gilford Rile..    [name] was given information about care management services today including:  Care management services include personalized support from designated clinical staff supervised by her physician, including individualized plan of care and coordination with other care providers 24/7 contact phone numbers for assistance for urgent and routine care needs. The patient may stop care management services at any time by phone call to the office staff.  Patient agreed to services and verbal consent obtained.   Follow up plan: Telephone appointment with care management team member scheduled for:01/16/2021  [place] [facility]

## 2021-01-15 NOTE — Telephone Encounter (Signed)
Called pt - stated it feels like she has an UTI. I asked about her symptoms - stated having pressure, odor, slight discharge but not burning. Frequency - stated she has to go every 2 hrs.but sometimes she has wait longer b/c she has to wait for her sister to help her. Stated she's unable to come in b/c she was just here yesterday and she comes by ambulance. Thanks

## 2021-01-15 NOTE — Assessment & Plan Note (Addendum)
Assessment: Patient recently evaluated in the emergency department for lower abdominal pain after having a Foley catheter removed secondary to retention/overactive bladder.  She is found to have leukocytes as well as RBC, WBC and mucus.  It looks like a culture was ordered however do not see one resulted.  Patient was started on Keflex 500 mg every 6 hours for 7 days.  She states after finishing this she did have some improvement but continued to have burning and then 4 days ago her lower abdominal pain returned.  Patient denies systemic symptoms but she is complaining of some back pain.  With her history of CKD I am concerned for possible pyelonephritis and incomplete treatment of her urinary tract infection.  I also suspect that she may still be retaining.  I have requested that the patient come in person for an inpatient evaluation that way we are able to BladderScan her and possibly place another Foley catheter.  I would also like to have a new urinary culture prior to represcribe antibiotics.  She is resistant to many antibiotics including what appears to be first and second generation cephalosporins, this may be why her antibiotic was not fully effective.  It appears after the Foley catheter was removed patient was post to follow-up with urology to determine if she continued to retain however it does not appear as though she had a follow-up appointment with them.  Unfortunately patient has difficult social situation and is unable to drive.  She is bedbound and has to rely on EMS to bring her to and from appointments.  I am hoping patient will qualify for home health services to resolve her transportation difficulties.  Plan: -Patient to be evaluated in person as soon as possible, preferably tomorrow -Repeat urinary cultures and treat based on these -Bladder scan, possibly place Foley catheter -Sitter reconsulting urology or have patient follow-up with them again

## 2021-01-15 NOTE — Telephone Encounter (Signed)
Called pt - agreeable to telehealth appt to discuss UTI symptoms. Appt scehule for today @ [id number] Pm w/Dr Johnney Ou.

## 2021-01-15 NOTE — Progress Notes (Signed)
   CC: Lower abdominal pain, burning with urination  This is a telephone encounter between Toni Hill and Toni Hill on 01/15/2021 for 25. The visit was conducted with the patient located at home and Toni Hill at [facility]. The patient's identity was confirmed using their DOB and current address. The patient has consented to being evaluated through a telephone encounter and understands the associated risks (an examination cannot be done and the patient may need to come in for an appointment) / benefits (allows the patient to remain at home, decreasing exposure to coronavirus). I personally spent 25 minutes on medical discussion.   HPI:  Toni Hill is a 75 y.o. with PMH as below.   Please see A&P for assessment of the patient's acute and chronic medical conditions.   Past Medical History:  Diagnosis Date   Bilateral lower extremity edema    right > left   Bipolar 1 disorder (HCC)    CKD (chronic kidney disease) stage 3, GFR 30-59 ml/min (HCC)     Dr Lawson Radar, [facility]   Diabetes, polyneuropathy ([place])    FEET AND FINGER TIPS   Fibromyalgia    GERD (gastroesophageal reflux disease)    History of chronic gastritis    History of COVID-24 Aug 2019   Hyperlipidemia    Hypertension    per pt was take off bp medication because of kidney disease told by her nephrologist   Hypothyroidism    Mixed incontinence urge and stress    OA (osteoarthritis)    KNEES   OAB (overactive bladder)    PONV (postoperative nausea and vomiting)    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  Denies systemic symptoms of fever, chills, nausea, vomiting.  Assessment & Plan:   See Encounters Tab for problem based charting.  Patient discussed with Dr. Fanny Bien DO  Internal Medicine Resident PGY-2 [place]  Pager: [phone]

## 2021-01-15 NOTE — Telephone Encounter (Signed)
-----   Message from Sanjuan Dame, MD sent at 01/14/2021  5:02 PM EDT ----- Regarding: FW: Pt seen today Hello! Would you mind calling Ms. Teachey to see if she needs to be seen for this? I appreciate it!!  Thanks, [email or web address] ----- Message ----- From: Donnetta Hutching, LMFT Sent: 01/14/2021   4:52 PM EDT To: Sanjuan Dame, MD Subject: RE: Pt seen today                              Pt also reported she has a UTI now-  I knew I forgot something.  TY!   ----- Message ----- From: Sanjuan Dame, MD Sent: 01/14/2021   4:49 PM EDT To: Donnetta Hutching, LMFT Subject: RE: Pt seen today                              Will do! I talked to her Friday, I can definitely understand that. I believe she has seen neurology in the recent past, not sure she would need a referral. I will leave that up to Dr. Gilford Rile' discretion.  Thank you! Doren Custard ----- Message ----- From: Donnetta Hutching, LMFT Sent: 01/14/2021   4:48 PM EDT To: Maudie Mercury, MD, Sanjuan Dame, MD Subject: Pt seen today                                  Hello,  I saw this Pt today & need for you to place a Referral to CCM so the SW Team can route this Pt's needs specifically. My note today covers her needs which include:   PureWick catheters via [facility] [place] [facility] support A possible ramp for her Str's home where she resides A Neurology Referral-per Pt reporting (?)  Dr. Gilford Rile & I have discussed this Pt extensively in the recent past. He will be most familiar w/her needs.  Thx so much,  Dr. Theodis Shove

## 2021-01-16 ENCOUNTER — Encounter: Payer: Self-pay | Admitting: Student

## 2021-01-16 ENCOUNTER — Ambulatory Visit (INDEPENDENT_AMBULATORY_CARE_PROVIDER_SITE_OTHER): Payer: Medicare Other | Admitting: Student

## 2021-01-16 ENCOUNTER — Telehealth: Payer: Medicare Other | Admitting: Licensed Clinical Social Worker

## 2021-01-16 VITALS — BP 130/56 | HR 75 | Temp 97.9°F | Ht 69.0 in

## 2021-01-16 DIAGNOSIS — N390 Urinary tract infection, site not specified: Secondary | ICD-10-CM | POA: Diagnosis present

## 2021-01-16 LAB — COMPREHENSIVE METABOLIC PANEL
ALT: 12 U/L (ref 0–44)
AST: 15 U/L (ref 15–41)
Albumin: 3.3 g/dL — ABNORMAL LOW (ref 3.5–5.0)
Alkaline Phosphatase: 64 U/L (ref 38–126)
Anion gap: 8 (ref 5–15)
BUN: 37 mg/dL — ABNORMAL HIGH (ref 8–23)
CO2: 32 mmol/L (ref 22–32)
Calcium: 9.6 mg/dL (ref 8.9–10.3)
Chloride: 100 mmol/L (ref 98–111)
Creatinine, Ser: 1.18 mg/dL — ABNORMAL HIGH (ref 0.44–1.00)
GFR, Estimated: 48 mL/min — ABNORMAL LOW (ref >60)
Glucose, Bld: 172 mg/dL — ABNORMAL HIGH (ref 70–99)
Potassium: 3.8 mmol/L (ref 3.5–5.1)
Sodium: 140 mmol/L (ref 135–145)
Total Bilirubin: 0.6 mg/dL (ref 0.3–1.2)
Total Protein: 6.2 g/dL — ABNORMAL LOW (ref 6.5–8.1)

## 2021-01-16 LAB — CBC WITH DIFFERENTIAL/PLATELET
Abs Immature Granulocytes: 0.01 10*3/uL (ref 0.00–0.07)
Basophils Absolute: 0 10*3/uL (ref 0.0–0.1)
Basophils Relative: 0 %
Eosinophils Absolute: 0 10*3/uL (ref 0.0–0.5)
Eosinophils Relative: 0 %
HCT: 42 % (ref 36.0–46.0)
Hemoglobin: 13.2 g/dL (ref 12.0–15.0)
Immature Granulocytes: 0 %
Lymphocytes Relative: 34 %
Lymphs Abs: 1.7 10*3/uL (ref 0.7–4.0)
MCH: 30.2 pg (ref 26.0–34.0)
MCHC: 31.4 g/dL (ref 30.0–36.0)
MCV: 96.1 fL (ref 80.0–100.0)
Monocytes Absolute: 0.5 10*3/uL (ref 0.1–1.0)
Monocytes Relative: 10 %
Neutro Abs: 2.8 10*3/uL (ref 1.7–7.7)
Neutrophils Relative %: 56 %
Platelets: 238 10*3/uL (ref 150–400)
RBC: 4.37 MIL/uL (ref 3.87–5.11)
RDW: 12.2 % (ref 11.5–15.5)
WBC: 5.1 10*3/uL (ref 4.0–10.5)
nRBC: 0 % (ref 0.0–0.2)

## 2021-01-16 NOTE — Progress Notes (Signed)
CC: Lower abdominal pain  HPI:  Toni Hill is a 75 y.o. female with a past medical history stated below and presents today for lower abdominal pain, back pain. Please see problem based assessment and plan for additional details.  Past Medical History:  Diagnosis Date   Bilateral lower extremity edema    right > left   Bipolar 1 disorder (HCC)    CKD (chronic kidney disease) stage 3, GFR 30-59 ml/min (HCC)     Dr Lawson Radar, [facility]   Diabetes, polyneuropathy ([place])    FEET AND FINGER TIPS   Fibromyalgia    GERD (gastroesophageal reflux disease)    History of chronic gastritis    History of COVID-24 Aug 2019   Hyperlipidemia    Hypertension    per pt was take off bp medication because of kidney disease told by her nephrologist   Hypothyroidism    Mixed incontinence urge and stress    OA (osteoarthritis)    KNEES   OAB (overactive bladder)    PONV (postoperative nausea and vomiting)    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   Type 2 diabetes mellitus treated with insulin ([place])    Wears glasses     Current Outpatient Medications on File Prior to Visit  Medication Sig Dispense Refill   acetaminophen (TYLENOL) 500 MG tablet Take 1,000 mg by mouth in the morning and at bedtime.     amLODipine (NORVASC) 5 MG tablet Take 5 mg by mouth daily.     Blood Glucose Monitoring Suppl (ONETOUCH VERIO REFLECT) w/Device KIT Check blood sugar 3 times a day 1 kit 0   Calcium Carb-Cholecalciferol (CALCIUM 600/VITAMIN D3) 600-800 MG-UNIT TABS Take 1 tablet by mouth daily.      cephALEXin (KEFLEX) 500 MG capsule Take 1 capsule (500 mg total) by mouth 4 (four) times daily. 28 capsule 0   ezetimibe (ZETIA) 10 MG tablet TAKE 1 TABLET BY MOUTH EVERY DAY IN THE EVENING 90 tablet 1   glucose blood (ONETOUCH VERIO) test strip Check blood sugar 3 times a day 300 each 3   HYDROcodone-acetaminophen (NORCO/VICODIN) 5-325 MG tablet Take 1 tablet by mouth every 6 (six) hours as  needed for severe pain. 8 tablet 0   insulin detemir (LEVEMIR FLEXTOUCH) 100 UNIT/ML FlexPen Inject 15 Units into the skin daily. 15 mL 1   Insulin Pen Needle (B-D ULTRAFINE III SHORT PEN) 31G X 8 MM MISC 1 each by Does not apply route daily. Diagnosis code: E11.9. Used to inject insulin daily. 100 each 2   levothyroxine (SYNTHROID) 137 MCG tablet Take 1 tablet (137 mcg total) by mouth daily before breakfast. (Patient taking differently: Take 137 mcg by mouth daily before breakfast. Brand name only) 90 tablet 3   lidocaine (LIDODERM) 5 % PLACE 1 PATCH ONTO THE SKIN DAILY. REMOVE & DISCARD PATCH WITHIN 12 HOURS OR AS DIRECTED BY MD 30 patch 0   NOVOLOG FLEXPEN 100 UNIT/ML FlexPen Inject 2-6 Units into the skin 3 (three) times daily with meals. Sliding scale     ondansetron (ZOFRAN) 4 MG tablet Take 1 tablet (4 mg total) by mouth 2 (two) times daily. 180 tablet 0   OneTouch Delica Lancets 84O MISC Check blood sugar 3 times a day 300 each 3   pantoprazole (PROTONIX) 40 MG tablet TAKE 1 TABLET BY MOUTH EVERY DAY 90 tablet 1   polyethylene glycol (MIRALAX) 17 g packet Take 17 g by mouth daily.  14 each 0   pregabalin (LYRICA) 100 MG capsule Take 1 capsule (100 mg total) by mouth 3 (three) times daily. 90 capsule 0   sertraline (ZOLOFT) 100 MG tablet TAKE 2 TABLETS BY MOUTH EVERY DAY (Patient taking differently: Take 200 mg by mouth daily.) 180 tablet 1   simvastatin (ZOCOR) 40 MG tablet Take 40 mg by mouth daily.     tiZANidine (ZANAFLEX) 4 MG tablet Take 1 tablet (4 mg total) by mouth in the morning and at bedtime. 60 tablet 2   VITAMIN E PO Take 4,000 Units by mouth 2 (two) times daily.     No current facility-administered medications on file prior to visit.    Family History  Problem Relation Age of Onset   Breast cancer Mother 48   Cancer Father        ? type   Breast cancer Sister 48   Breast cancer Maternal Grandmother        ? age    Social History   Socioeconomic History   Marital  status: Married    Spouse name: Not on file   Number of children: Not on file   Years of education: Not on file   Highest education level: Not on file  Occupational History   Not on file  Tobacco Use   Smoking status: Never   Smokeless tobacco: Never  Vaping Use   Vaping Use: Never used  Substance and Sexual Activity   Alcohol use: No   Drug use: No   Sexual activity: Not Currently  Other Topics Concern   Not on file  Social History Narrative   Not on file   Social Determinants of Health   Financial Resource Strain: Not on file  Food Insecurity: Not on file  Transportation Needs: Not on file  Physical Activity: Not on file  Stress: Not on file  Social Connections: Not on file  Intimate Partner Violence: Not on file    Review of Systems: ROS negative except for what is noted on the assessment and plan.  Vitals:   01/16/21 0948  BP: (!) 130/56  Pulse: 75  Temp: 97.9 F (36.6 C)  TempSrc: Oral  SpO2: 99%  Height: $Remove'5\' 9"'QjKsGBb$  (1.753 m)   Physical Exam: Constitutional: Well-appearing, no acute distress HENT: normocephalic atraumatic Eyes: conjunctiva non-erythematous Neck: supple Cardiovascular: regular rate and rhythm, no m/r/g Pulmonary/Chest: normal work of breathing on room air Abdominal: Soft, nondistended.  Suprapubic tenderness with palpation MSK: normal bulk and tone.  Left-sided CVA tenderness Neurological: alert & oriented x 3 Skin: warm and dry Psych: Normal mood and thought process  Assessment & Plan:   See Encounters Tab for problem based charting.  Patient discussed with Dr. Fanny Bien, D.O. [facility], PGY-2 Pager: [phone], Phone: [phone] Date 01/16/2021 Time 5:16 PM

## 2021-01-16 NOTE — Assessment & Plan Note (Addendum)
Assessment: Patient presented today for evaluation concerning for urinary tract infection.  Please assessment deal from yesterday's televisit, in brief patient was seen in the ED and treated for you during tract infection, had some improvement but her symptoms returned.  In the past she was needed a Foley catheter for an overactive bladder spasms and urinary retention  On exam today she has suprapubic tenderness as well as left CVA tenderness.  She continues to deny systemic symptoms of fever or chills.  She actually states that she feels better today.  CBC unremarkable for leukocytosis.  Creatinine at baseline.  Pending UA as well as urine culture.  Post void residual 83 mL.  Patient states that sometimes it is still difficult to urinate.  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  We will recommend that she follow-up with urology for further management of her overactive bladder.  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  I also believe that patient would benefit from pure wick's as she is bedbound and relies on her sister to move to and from the bed to urinate.  I do believe that patient at times does not want to bother her sister and will hold her urine and which can lead to increased urinary tract infections.  Plan: -Pending urine cultures -Follow-up with urology -Chronic care management following patient, will discuss with them patient having Paroex at home.  Addendum: Cultures e.coli, only resistant to ampicillin.  Because patient is having back pain and this would be her second round of antibiotics, will prescribe cefdinir 300 mg twice daily for 10 days.

## 2021-01-16 NOTE — Patient Instructions (Signed)
Thank you, Ms.[name] for allowing [place] to provide your care today. Today we discussed   Urinary Tract Infection We will continue to work to get to your [place].  I will call you with results of your test and discuss medications with you over the phone.  I have ordered the following labs for you:   Lab Orders  Culture, Urine  Urinalysis, Complete (81001)  CMP w Anion Gap (STAT/Sunquest-performed on-site)  CBC with Diff     Referrals ordered today:   Referral Orders  No referral(s) requested today     I have ordered the following medication/changed the following medications:   Stop the following medications: There are no discontinued medications.   Start the following medications: No orders of the defined types were placed in this encounter.    Follow up:  2 weeks follow-up due to urinary tract   Should you have any questions or concerns please call the internal medicine clinic at [phone].     Sanjuana Letters, D.O. [place]

## 2021-01-16 NOTE — Progress Notes (Signed)
Bladder scanner - 302 ml ; pt voided,specimen collected. Post void residual 83 ML.

## 2021-01-16 NOTE — Telephone Encounter (Signed)
  Care Management   Follow Up Note   01/16/2021 Name: Toni Hill MRN: [id number] DOB: Apr 14, 1946   Referred by: Maudie Mercury, MD Reason for referral : No chief complaint on file.   An unsuccessful telephone outreach was attempted today. The patient was referred to the case management team for assistance with care management and care coordination.   Follow Up Plan: The care management team will reach out to the patient again over the next 7 days.   Milus Height, [place]  Social Worker [facility] Care Management  [phone]

## 2021-01-17 LAB — URINALYSIS, COMPLETE
Bilirubin, UA: NEGATIVE
Glucose, UA: NEGATIVE
Ketones, UA: NEGATIVE
Nitrite, UA: NEGATIVE
Protein,UA: NEGATIVE
RBC, UA: NEGATIVE
Specific Gravity, UA: 1.018 (ref 1.005–1.030)
Urobilinogen, Ur: 1 mg/dL (ref 0.2–1.0)
pH, UA: 6.5 (ref 5.0–7.5)

## 2021-01-17 LAB — MICROSCOPIC EXAMINATION: Casts: NONE SEEN /lpf

## 2021-01-19 LAB — URINE CULTURE

## 2021-01-19 MED ORDER — CEFDINIR 300 MG PO CAPS: 300.0000 mg | ORAL_CAPSULE | Freq: Two times a day (BID) | ORAL | 0 refills | Status: AC

## 2021-01-19 NOTE — Addendum Note (Signed)
Addended by: Riesa Pope on: 01/19/2021 04:46 PM   Modules accepted: Orders

## 2021-01-19 NOTE — Progress Notes (Signed)
Internal Medicine Clinic Attending ? ?Case discussed with Dr. Braswell  At the time of the visit.  We reviewed the resident?s history and exam and pertinent patient test results.  I agree with the assessment, diagnosis, and plan of care documented in the resident?s note.  ?

## 2021-01-23 ENCOUNTER — Telehealth: Payer: Self-pay | Admitting: *Deleted

## 2021-01-23 NOTE — Telephone Encounter (Signed)
   Telephone encounter was:  Unsuccessful.  01/23/2021 Name: DIASIA HENKEN MRN: [id number] DOB: [birth year]  Unsuccessful outbound call made today to assist with:  Transportation Needs  and Food Insecurity  Outreach Attempt:  2nd Attempt  A HIPAA compliant voice message was left requesting a return call.  Instructed patient to call back at mailbox full not accepting messages      [place], Care Management  [phone] [address] , [place] [address] Email : [email or web address]

## 2021-01-23 NOTE — Telephone Encounter (Signed)
PCS FORM FAXED TO [facility] / [place] (Ph) [phone]   / (FAX) [phone].Marland Kitchen SPOKE WITH PATIENT, SHE IS AWARE THAT FORM IS BEING FAXED AND SHE WILL BE CONTACTED BY SOMEONE CONCERNING PCS.

## 2021-01-26 ENCOUNTER — Telehealth: Payer: Self-pay | Admitting: *Deleted

## 2021-01-26 NOTE — Telephone Encounter (Signed)
   Telephone encounter was:  Successful.  01/26/2021 Name: CRISTOL ENGDAHL MRN: [id number] DOB: 03-Aug-1945  Toni Hill is a 75 y.o. year old female who is a primary care patient of Maudie Mercury, MD . The community resource team was consulted for assistance with Transportation Needs , Food Insecurity, Home Modifications, and Caregiver Stress  Care guide performed the following interventions: Patient provided with information about care guide support team and interviewed to confirm resource needs.talked with patient about transportation and is in  a great deal of pain and will send her information on her community resources in [place].   Follow Up Plan:  No further follow up planned at this time. The patient has been provided with needed resources.  [place], Care Management  [phone] [address] , [place] [address] Email : [email or web address]

## 2021-01-26 NOTE — Telephone Encounter (Signed)
PCS forms faxed to [employer] / [place]  [phone] / (Fax) [phone] . Faxed at 11:46 am with confirmation sheet.  Patient contacted and is aware that forms been faxed and she will be contacted.

## 2021-01-26 NOTE — Progress Notes (Signed)
Internal Medicine Clinic Attending  Case discussed with Dr. Johnney Ou  At the time of the visit.  We reviewed the resident's history pertinent patient test results.  I agree with the assessment, diagnosis, and plan of care documented in the resident's note.

## 2021-01-27 NOTE — Progress Notes (Signed)
Internal Medicine Clinic Attending  Case discussed with Dr. Katsadouros  At the time of the visit.  We reviewed the resident's history and exam and pertinent patient test results.  I agree with the assessment, diagnosis, and plan of care documented in the resident's note.  

## 2021-01-28 ENCOUNTER — Encounter: Payer: Medicare Other | Admitting: Student

## 2021-01-30 ENCOUNTER — Encounter: Payer: Medicare Other | Admitting: Student

## 2021-02-03 ENCOUNTER — Other Ambulatory Visit: Payer: Self-pay

## 2021-02-03 ENCOUNTER — Ambulatory Visit (INDEPENDENT_AMBULATORY_CARE_PROVIDER_SITE_OTHER): Payer: Medicare Other | Admitting: Student

## 2021-02-03 ENCOUNTER — Encounter: Payer: Self-pay | Admitting: Student

## 2021-02-03 VITALS — BP 97/63 | HR 86 | Temp 97.6°F

## 2021-02-03 DIAGNOSIS — I10 Essential (primary) hypertension: Secondary | ICD-10-CM | POA: Diagnosis not present

## 2021-02-03 DIAGNOSIS — Z794 Long term (current) use of insulin: Secondary | ICD-10-CM | POA: Diagnosis not present

## 2021-02-03 DIAGNOSIS — E1140 Type 2 diabetes mellitus with diabetic neuropathy, unspecified: Secondary | ICD-10-CM | POA: Diagnosis not present

## 2021-02-03 DIAGNOSIS — N390 Urinary tract infection, site not specified: Secondary | ICD-10-CM | POA: Diagnosis not present

## 2021-02-03 MED ORDER — TRESIBA FLEXTOUCH 100 UNIT/ML ~~LOC~~ SOPN: 9.0000 [IU] | PEN_INJECTOR | Freq: Every day | SUBCUTANEOUS | 2 refills | Status: DC

## 2021-02-03 MED ORDER — LEVEMIR FLEXTOUCH 100 UNIT/ML ~~LOC~~ SOPN: 8.0000 [IU] | PEN_INJECTOR | Freq: Every day | SUBCUTANEOUS | 1 refills | Status: DC

## 2021-02-03 MED ORDER — TRESIBA FLEXTOUCH 100 UNIT/ML ~~LOC~~ SOPN: 10.0000 [IU] | PEN_INJECTOR | Freq: Every day | SUBCUTANEOUS | 2 refills | Status: DC

## 2021-02-03 NOTE — Progress Notes (Signed)
   CC: 2 week follow-up for UTI  HPI:  [name] is a 75 y.o. with past medical history of hypertension, type 2 diabetes who presented to the clinic for follow-up for her UTI symptoms.  Please see problem based charting for details.  Past Medical History:  Diagnosis Date   Bilateral lower extremity edema    right > left   Bipolar 1 disorder (HCC)    CKD (chronic kidney disease) stage 3, GFR 30-59 ml/min (HCC)     Dr Lawson Radar, [facility]   Diabetes, polyneuropathy ([place])    FEET AND FINGER TIPS   Fibromyalgia    GERD (gastroesophageal reflux disease)    History of chronic gastritis    History of COVID-24 Aug 2019   Hyperlipidemia    Hypertension    per pt was take off bp medication because of kidney disease told by her nephrologist   Hypothyroidism    Mixed incontinence urge and stress    OA (osteoarthritis)    KNEES   OAB (overactive bladder)    PONV (postoperative nausea and vomiting)    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   Type 2 diabetes mellitus treated with insulin ([place])    Wears glasses    Review of Systems:   Per HPI  Physical Exam:  Vitals:   02/03/21 0922  BP: 97/63  Pulse: 86  Temp: 97.6 F (36.4 C)  SpO2: 100%   Physical Exam Constitutional:      General: She is not in acute distress. HENT:     Head: Normocephalic.  Eyes:     Conjunctiva/sclera: Conjunctivae normal.  Cardiovascular:     Rate and Rhythm: Normal rate and regular rhythm.  Pulmonary:     Effort: Pulmonary effort is normal. No respiratory distress.  Abdominal:     General: Bowel sounds are normal. There is no distension.     Comments: Suprapubic tenderness  Skin:    General: Skin is warm.  Neurological:     Mental Status: She is alert and oriented to person, place, and time.  Psychiatric:        Mood and Affect: Mood normal.        Behavior: Behavior normal.     Assessment & Plan:   See Encounters Tab for problem based charting.  Patient  discussed with Dr. Heber [place]

## 2021-02-03 NOTE — Patient Instructions (Addendum)
Ms. Bowe,  It was nice seeing you in the clinic today.  Here is a summary what we talked about:  1.  Urinary tract infection: I sent out a referral to urology.  I will call you for the result of your urine test.  2.  Low blood pressure: Please stop taking amlodipine.  3.  Type 2 diabetes: Blood sugar is low in the morning.  I will stop Levemir and NovoLog.  I will start a ultra long-acting insulin called Tyler Aas, please give 9 units at bedtime.  Butch Penny will call you in a 2 weeks for follow-up.  Please do a telehealth visit in 4 weeks.  Take care,   Dr. Alfonse Spruce

## 2021-02-03 NOTE — Assessment & Plan Note (Addendum)
Patient is here for 2 weeks follow-up on her UTI.  Patient was originally in the ED on 6/15 for urinary retention, Foley was placed.  She was seen again in the ED on 6/23 for lower abdominal pain.  The Foley was removed by urologist earlier.  CT scan remarkable for multiple intrarenal stones.  She was started on cephalexin for 7 days.  Patient was seen on 7/15 in the office with persistent of her lower abdominal pain.  Her bladder scan was negative for retention.  Urine culture grew E. coli and she was started on cefdinir for 10 days.  Renal function and CBC were unremarkable.  Today, she reports persistent suprapubic pressure pain.  She denies fever, chills, new back pain, nausea, vomiting, hematuria or dysuria.  Reports good urine every day.  Her sister typically changed her diaper but she has been in the hospital thinking husband so patient is changing her own diaper.  She is concerned about hygiene due to difficulty changing diaper herself.   Assessment and plan Bladder scan was performed at bedside with only showed 70 cc.  I do not believe that her UTI persisted with 2 courses of antibiotics.  She is however prone for infection due to hygiene issue.  Will obtain UA to confirm eradication.  Patient was seen by urologist but does not want to come back to the office.  Referral to different urologist sent.  Addendum UA is positive for leukocyte esterases, pyuria and bacteriuria.  Urine culture grew E. coli only resistant to ampicillin.  After speaking to patient, she said that she has not picked up her last prescription of cefdinir.    We will send out another prescription of cefdinir 300 mg twice daily to her pharmacy.  Advised patient to call us if she does not get the prescription.  Patient verbalizes understanding.

## 2021-02-03 NOTE — Assessment & Plan Note (Signed)
Initial blood pressure 97/64, repeat 112/60.  Patient states that she has to take care of her own food by her sister in the hospital.  She reports eating junk food and some fruit probably by family.  Her mobility is limited at baseline.  Her hypotension can be from her poor p.o. intake.  Low suspicion for sepsis.  - Will discontinue amlodipine.   - Encourage fluid intake.

## 2021-02-03 NOTE — Assessment & Plan Note (Signed)
A1c 1 month ago was 7.4.  Patient's CBG was 44 when first arrived in the clinic.  She was given juice and crackers which improved her CBG to 86.  Patient said that she administer her own insulin.  Said that she administered 15 units of Levemir last night and 5 units of NovoLog this morning with a bagel for breakfast.  She denies any symptoms of hypoglycemia.  Assessment and plan Hypoglycemia can be secondary to too much basal insulin vs incorrect dose administration.  CGM record showed low blood glucose in the morning and high during the day, this suggests overcorrection of basal insulin and not enough short acting.  I am also worried about her ability to correctly administer insulins.  To simplify for her regimen and prevent hypoglycemic events, will stop Levemir and NovoLog.  Start Tresiba 9 units at bedtime.  She will follow-up with Butch Penny in 2 weeks to make sure her regimen is doing okay.  Hopefully we can download her CGM record remotely.  -Stop Levemir and NovoLog -Start Tresiba 9 units at bedtime -Follow-up with Butch Penny in 2 weeks -Follow-up with PCP in 4 weeks

## 2021-02-04 ENCOUNTER — Ambulatory Visit: Payer: Medicare Other | Admitting: Behavioral Health

## 2021-02-04 DIAGNOSIS — F331 Major depressive disorder, recurrent, moderate: Secondary | ICD-10-CM

## 2021-02-04 DIAGNOSIS — F319 Bipolar disorder, unspecified: Secondary | ICD-10-CM

## 2021-02-04 DIAGNOSIS — F419 Anxiety disorder, unspecified: Secondary | ICD-10-CM

## 2021-02-04 LAB — MICROSCOPIC EXAMINATION
Casts: NONE SEEN /lpf
WBC, UA: >30 /hpf — AB (ref 0–5)

## 2021-02-04 LAB — URINALYSIS, ROUTINE W REFLEX MICROSCOPIC
Bilirubin, UA: NEGATIVE
Ketones, UA: NEGATIVE
Nitrite, UA: NEGATIVE
Protein,UA: NEGATIVE
RBC, UA: NEGATIVE
Specific Gravity, UA: 1.023 (ref 1.005–1.030)
Urobilinogen, Ur: 0.2 mg/dL (ref 0.2–1.0)
pH, UA: 5 (ref 5.0–7.5)

## 2021-02-04 NOTE — Progress Notes (Signed)
Internal Medicine Clinic Attending  Case discussed with Dr. Alfonse Spruce    At the time of the visit.  We reviewed the resident's history and exam and pertinent patient test results.  I agree with the assessment, diagnosis, and plan of care documented in the resident's note. As noted main concern here is her hypoglycemia.  Review of CGM data shows hypoglycemia happening in AM with PM hyperglycemia and rapid correction overnight.  She will be better served by an ultra long acting insulin (tresebia) and dose reduction to remove hypoglycemia, we will then need to work on readding mealtime as needed.

## 2021-02-04 NOTE — Addendum Note (Signed)
Addended byGaylan Gerold on: 02/04/2021 09:37 AM   Modules accepted: Orders

## 2021-02-04 NOTE — BH Specialist Note (Signed)
Integrated Behavioral Health via Telemedicine Visit  02/04/2021 Toni Hill [id number]  Number of [place] visits: 6/6 Session Start time: 1:30pm  Session End time: 2:00pm Total time: 30  Referring Provider: Dr. Maudie Mercury, MD Patient/Family location: Pt is home in private [facility] Provider location: [facility] Office All persons participating in visit: Pt & Clinician Types of Service: Individual psychotherapy  I connected with Toni Hill and/or Toni Hill's  self  via  Telephone or Video Enabled Telemedicine Application  (Video is Caregility application) and verified that I am speaking with the correct person using two identifiers. Discussed confidentiality:  6th visit  I discussed the limitations of telemedicine and the availability of in person appointments.  Discussed there is a possibility of technology failure and discussed alternative modes of communication if that failure occurs.  I discussed that engaging in this telemedicine visit, they consent to the provision of behavioral healthcare and the services will be billed under their insurance.  Patient and/or legal guardian expressed understanding and consented to Telemedicine visit:  6th visit  Presenting Concerns: Patient and/or family reports the following symptoms/concerns: elevated anx/dep & pain Duration of problem: 2022; Severity of problem: moderate  Patient and/or Family's Strengths/Protective Factors: Social connections and Concrete supports in place (healthy food, safe environments, etc.)  Goals Addressed: Patient will:  Reduce symptoms of: anxiety, depression, and stress   Increase knowledge and/or ability of: coping skills and stress reduction   Demonstrate ability to: Increase healthy adjustment to current life circumstances, Increase adequate support systems for patient/family, Increase motivation to adhere to plan of care, and Begin healthy grieving over loss of marriage &  health  Progress towards Goals: Ongoing  Interventions: Interventions utilized:  Behavioral Activation and Supportive Counseling Standardized Assessments completed:  screeners prn  Patient and/or Family Response: Pt receptive to call  Assessment: Patient currently experiencing anx/dep & distress due to her current situation w/health status changes & living w/her Str. Str is caring for Husb who is d/c'd from [facility] recently so he can die @ his home.  Pt in need of Homehealth care Referral that is already in process to care for her Px needs due to immobility.   Pt is emot'ly labile today & unable to speak clearly.   Patient may benefit from fllw-through on her homehealth needs, improved emot'l regulation skills, & activities to occupy her when she is alone due to Str's need to care for Spouse who is dying.  Plan: Follow up with behavioral health clinician on : 2-3 wks on telehealth for 60 min Behavioral recommendations: Use [email or web address] app. Take more time to read hard cc materials to occupy your mind. Referral(s): [place] (In Clinic)  I discussed the assessment and treatment plan with the patient and/or parent/guardian. They were provided an opportunity to ask questions and all were answered. They agreed with the plan and demonstrated an understanding of the instructions.   They were advised to call back or seek an in-person evaluation if the symptoms worsen or if the condition fails to improve as anticipated.  Toni Hutching, LMFT

## 2021-02-05 ENCOUNTER — Telehealth: Payer: Self-pay | Admitting: Licensed Clinical Social Worker

## 2021-02-05 ENCOUNTER — Telehealth: Payer: Medicare Other | Admitting: Licensed Clinical Social Worker

## 2021-02-05 NOTE — Telephone Encounter (Signed)
  Care Management   Follow Up Note   02/05/2021 Name: Toni Hill MRN: [id number] DOB: 1946/05/15   Referred by: Maudie Mercury, MD Reason for referral : No chief complaint on file.   An unsuccessful telephone outreach was attempted today. The patient was referred to the case management team for assistance with care management and care coordination.   Follow Up Plan: No further follow up required:    Milus Height, [place] Worker [facility] Care Management  [phone]

## 2021-02-06 LAB — URINE CULTURE

## 2021-02-06 LAB — SPECIMEN STATUS REPORT

## 2021-02-09 ENCOUNTER — Telehealth: Payer: Self-pay | Admitting: Internal Medicine

## 2021-02-09 ENCOUNTER — Telehealth: Payer: Self-pay | Admitting: Licensed Clinical Social Worker

## 2021-02-09 ENCOUNTER — Telehealth: Payer: Medicare Other | Admitting: Licensed Clinical Social Worker

## 2021-02-09 MED ORDER — CEFDINIR 300 MG PO CAPS: 300.0000 mg | ORAL_CAPSULE | Freq: Two times a day (BID) | ORAL | 0 refills | Status: AC

## 2021-02-09 NOTE — Telephone Encounter (Signed)
Pt requesting a call back about her legs.

## 2021-02-09 NOTE — Addendum Note (Signed)
Addended byGaylan Gerold on: 02/09/2021 02:19 PM   Modules accepted: Orders

## 2021-02-09 NOTE — Telephone Encounter (Signed)
  Care Management   Follow Up Note   02/09/2021 Name: Toni Hill MRN: [id number] DOB: 10/18/1945   Referred by: Maudie Mercury, MD Reason for referral : No chief complaint on file.   A second unsuccessful telephone outreach was attempted today. The patient was referred to the case management team for assistance with care management and care coordination.   Follow Up Plan: The care management team will reach out to the patient again over the next 30 days.   Milus Height, [place]  Social Worker [facility] Care Management  [phone]

## 2021-02-09 NOTE — Telephone Encounter (Signed)
Return pt's call who stated she needs to go back to see the neurologist about her legs. Stated she had called but was told the doctor needs orders. Informed pt I will call the office.  I called [facility], talked to the referral coordinator since a referral was placed on April. She stated  a June appt was canceled but unsure why. Stated she will call pt to re-schedule the appt.  Called pt back to let her know [facility] will be calling her. Also gave her their telephone #.

## 2021-02-10 ENCOUNTER — Telehealth: Payer: Self-pay | Admitting: Internal Medicine

## 2021-02-10 NOTE — Telephone Encounter (Signed)
Pt requesting a call back about her legs being in a lot of pain.

## 2021-02-10 NOTE — Telephone Encounter (Signed)
Return pt's call. Stated she continues to have leg pain and could not sleep last night. I asked if [facility] called her; stated they have not. She has their phone #, informed her to call their office-stated she will. Pt's requesting something for the pain and spasms which she states is getting worse. Thanks

## 2021-02-12 ENCOUNTER — Telehealth: Payer: Self-pay | Admitting: *Deleted

## 2021-02-12 ENCOUNTER — Ambulatory Visit (INDEPENDENT_AMBULATORY_CARE_PROVIDER_SITE_OTHER): Payer: Medicare Other | Admitting: Dietician

## 2021-02-12 ENCOUNTER — Other Ambulatory Visit: Payer: Self-pay | Admitting: Student

## 2021-02-12 ENCOUNTER — Encounter: Payer: Self-pay | Admitting: Dietician

## 2021-02-12 DIAGNOSIS — E1140 Type 2 diabetes mellitus with diabetic neuropathy, unspecified: Secondary | ICD-10-CM | POA: Diagnosis not present

## 2021-02-12 DIAGNOSIS — Z794 Long term (current) use of insulin: Secondary | ICD-10-CM

## 2021-02-12 DIAGNOSIS — Z713 Dietary counseling and surveillance: Secondary | ICD-10-CM | POA: Diagnosis not present

## 2021-02-12 DIAGNOSIS — F444 Conversion disorder with motor symptom or deficit: Secondary | ICD-10-CM

## 2021-02-12 NOTE — Telephone Encounter (Signed)
On chart review, patient has had two referrals placed in the past for her leg pain. One to [facility] and the other to neurology. It appears as though they attempted to contact patient but were unable to. She will need to call these offices to schedule appointments.   If the pain is worsening and she feels as though she needs to be seen immediately, we can see her in the clinic, but from the notes it does not appear as though Dr. Collene Gobble thought opioids or other pain medications would help her. Because this has been a chronic concern for some time, I think she would best benefit from the specialists and possibly a pain management consult if neither of those referrals help her.    If she feels like she is having a medical emergency she should go to the ED or call 911.

## 2021-02-12 NOTE — Telephone Encounter (Signed)
Talked to pt - stated she talked to someone at [facility] who recommended a MRI of her spine. Also she has an appt [facility] on Monday 8/15.

## 2021-02-12 NOTE — Telephone Encounter (Signed)
SPOKE WITH PATIENT REGARDING HER PCS / PATIENT HAS A NURSE HOME VISIT ON 02-17-2021 @12 :30PM

## 2021-02-12 NOTE — Patient Instructions (Addendum)
Delainey,  I recommend a glucose control liquid supplement (GLUCERNA or BOOST Glucose Control)  for you at least one can per day. Drinking milk and eating yogurt are  also  good source of protein.   To help keep your blood sugars stable throughout the day:   You can eat more:  - healthy fats (nuts, olives, oil, peanut butter, cheese)  - protein (cottage cheese, chicken, tuna or egg salad, meat and greek yogurt)  - vegetables - green beans, greens, peas and carrots, broccoli  these do not increase your blood sugar as much.    It is important to limit these because they raise your blood sugar a lot.    - starches -cereal, bagel, crackers, bread, potatoes, rice, corn -  sugary foods- fruit , sweets  For constipation:  o Drink 64 fl oz or more of water and other low-calorie (10 calories or less per 8 fl oz) beverages  o You can add 1-3 teaspoons per day of Metamucil or Benefiber, 1 teaspoon at a time, to any fluids you choose  o Add chia seeds or freshly ground flax seeds to yogurt or oatmeal  o Eat some prunes, can have a little prune juice  o Eat as much leafy green vegetables as possible (spinach, kale, collards, turnip and mustard greens); they provided a lot of fiber, and are a good source of magnesium as well. Add kale or spinach to smoothies with frozen berries (more fiber) if you do not like to eat greens.  o Other magnesium-rich foods are: legumes (beans and lentils - also great sources of fiber), nuts like almonds and cashews, avocado, yogurt, and milk.  o Over time try to increase intake of vegetables and fruits to 1 or more cups each per day. This will probably take 6+ months.  Butch Penny [phone]

## 2021-02-12 NOTE — Progress Notes (Signed)
Medical Nutrition Therapy Via Telemedicine (Telephone):  Appt start time: 0920 end time:  0940. Total time: 20 Visit # 1  This is a telephone encounter between [name]  and Butch Penny Aikam Hellickson  on 02/12/2021 for Diabetes Self Management Education & Support . The visit was conducted with the patient located at home and [name]  at [facility]. The patient's identity was confirmed using their DOB and current address. The patient has consented to being evaluated through a telephone encounter and understands the associated risks / benefits (allows the patient to remain at home, decreasing exposure to coronavirus). I personally spent 20 minutes on medical nutrition therapy discussion.   The following statements were read to the patient and/or legal guardian that are established with the Nutritional Health Provider.   "The purpose of this phone visit is to provide behavioral health care while limiting exposure to the coronavirus (COVID19).  There is a possibility of technology failure and discussed alternative modes of communication if that failure occurs."   "By engaging in this telephone visit, you consent to the provision of healthcare.  Additionally, you authorize for your insurance to be billed for the services provided during this telephone visit."    Patient and/or legal guardian consented to telephone visit: yes  Assessment:  She had a 11% weight loss in 5 months this year and is currently maintaining her BMI at the lowest acceptable range for her age. She likely has moderate to severe malnutrition. Improved blood sugars would help maintain her nutritional status and prevent bedsores.  Her primary concerns today: "Leg is really hurting. Sleeping a lot."  "I don't think the way he changed my medicines is working". Thinks blood sugars are too high- see reported values below ranging from 89- ~ 400 mg/dL She states she is constipated. This stops her from urinating. Wearing a diapers, but does not  change it often.  Just started antibiotic last Friday for UTI.( Has been on it ~ 7 days)  She wants to know why she has not gotten pain pills for her leg.   Agrees that fluctuating blood sugars is probably being caused by the food she is eating. Her sister is shopping and preparing foods and "does not know about carbohydrates".   Preferred Learning Style: No preference indicated  Learning Readiness: Contemplating/Ready  ANTHROPOMETRICS: Estimated body mass index is 22.45 kg/m as calculated from the following:   Height as of 01/16/21: 5\' 9"  (1.753 m).   Weight as of 12/25/20: 152 lb (68.9 kg).  WEIGHT HISTORY:  Wt Readings from Last 10 Encounters:  12/25/20 152 lb (68.9 kg)  12/25/20 152 lb (68.9 kg)  12/17/20 150 lb (68 kg)  12/02/20 150 lb (68 kg)  11/21/20 150 lb (68 kg)  06/14/20 170 lb (77.1 kg)  03/20/20 170 lb 9.6 oz (77.4 kg)  03/05/20 174 lb 13.2 oz (79.3 kg)  01/10/20 180 lb 9.6 oz (81.9 kg)  01/05/20 162 lb 0.6 oz (73.5 kg)   MEDICATIONS: Meds- is taking 9 units insulin once a day BLOOD SUGAR:89 this am, 113 last night, 902 pm-363 326 pm- 295 1015 on 8-8    400s 1215 am 259 1119 am 159 1214 am  272 533 pm 281 828 am  221 157 am 246 246 pm 394 1229 pm 259 715 am 146  DIETARY INTAKE: Usual eating pattern includes 3 meals and 1-2 snacks per day.  Avoided foods include sweets, Constipation: yes Dining Out (times/week): 0 24-hr recall:  Breakfast- big bagel, cream  cheese Lunch- egg salad Dinner- 1 slice pizza Drinks only water Snacks- Nectarines, banana, strawberries  Usual physical activity: limited to bed  Progress Towards Goal(s):  In progress.   Nutritional Diagnosis:  [place]-3.2 Unintentional weight loss As related to her debilitated state and illnesses.  As evidenced by her report and change in documented weight. .  NB 1.4 Nutrition and food related knowledge deficit as related to  her fluctuating blood sugars and not realizing what foods she can eat  more of to help stabilize them as evidenced by her report.     Intervention:  Nutrition education about what to do about high blood sugars and review of what foods she can eat mor of. Action Goal:eat more lower carb foods to help control blood sugars  Outcome goal: more stable blood sugars Coordination of care: discuss with her doctors  Teaching Method Utilized: Visual, Auditory,Hands on Handouts given during visit include:what to do about high blood sugars, what to eat.  After visit summary Barriers to learning/adherence to lifestyle change: debilitated state, competing values Demonstrated degree of understanding via:  Teach Back   Monitoring/Evaluation:  Dietary intake, exercise, reader and phone, and body weight in 4 week(s). [name], RD 02/12/2021 10:31 AM.

## 2021-02-16 ENCOUNTER — Telehealth: Payer: Self-pay | Admitting: Internal Medicine

## 2021-02-16 ENCOUNTER — Encounter: Payer: Medicare Other | Admitting: Physical Medicine and Rehabilitation

## 2021-02-16 NOTE — Telephone Encounter (Signed)
Return pt's call. C/o pain in her toes; stated she wants to walk again - requesting an Ortho referral. Her pain management appointment was canceled today - pt stated transportation did not pick her up so she had to re-schedule for Oct 26, first available. I asked about her calling neurologist , stated "It's my bone" "I need to see an orthopedist". Informed pt,according to chart, there's a nurse visit schedule for tomorrow for PCS - stated "good" her sister is 70 yo and needs some help with her. Thanks

## 2021-02-16 NOTE — Telephone Encounter (Signed)
Pt requesting a call back about her foot.

## 2021-02-17 ENCOUNTER — Ambulatory Visit (INDEPENDENT_AMBULATORY_CARE_PROVIDER_SITE_OTHER): Payer: Medicare Other | Admitting: Internal Medicine

## 2021-02-17 DIAGNOSIS — F444 Conversion disorder with motor symptom or deficit: Secondary | ICD-10-CM

## 2021-02-17 DIAGNOSIS — M5136 Other intervertebral disc degeneration, lumbar region: Secondary | ICD-10-CM

## 2021-02-17 DIAGNOSIS — M48061 Spinal stenosis, lumbar region without neurogenic claudication: Secondary | ICD-10-CM

## 2021-02-17 DIAGNOSIS — M5440 Lumbago with sciatica, unspecified side: Secondary | ICD-10-CM

## 2021-02-17 NOTE — Telephone Encounter (Signed)
Call from pt - inform pt she needs to be evaluated before ortho referral can be ordered. States she's unable to come in b/c she has to come by ambulance. Agreedto telehealth appt - appt schedule today w/Dr Marianna Payment @ 2197 PM.

## 2021-02-17 NOTE — Telephone Encounter (Signed)
I will have to evaluate her before submitting an ortho referral.

## 2021-02-17 NOTE — Progress Notes (Signed)
   I connected with  Toni Hill on 02/18/21 by telephone and verified that I am speaking with the correct person using two identifiers.   I discussed the limitations of evaluation and management by telemedicine. The patient expressed understanding and agreed to proceed.  CC: DDD  This is a telephone encounter between Toni Hill and Marianna Payment on 02/18/2021 for DDD. The visit was conducted with the patient located at home and Marianna Payment at [facility]. The patient's identity was confirmed using their DOB and current address. The patient has consented to being evaluated through a telephone encounter and understands the associated risks (an examination cannot be done and the patient may need to come in for an appointment) / benefits (allows the patient to remain at home, decreasing exposure to coronavirus). I personally spent 10 minutes on medical discussion.   HPI:  [name] is a 75 y.o. with PMH as below.   Please see A&P for assessment of the patient's acute and chronic medical conditions.   Past Medical History:  Diagnosis Date   At risk for adverse drug event 01/22/2019     RIOSORD (Risk Index for Overdose or Serious Opioid -induced Respiratory Depression Risk ) is a tool which determines that risk over the  ensuing 6 months . Based on point total for personal medical co-morbidities and present drug therapies ; the risk : benefit ratio of any drug regimen can be defined and informed consent documented. Reference: [facility] Hospitalist October 2017    Bilateral lower extremity edema    right > left   Bipolar 1 disorder ([place])    CKD (chronic kidney disease) stage 3, GFR 30-59 ml/min (HCC)     Dr Lawson Radar, [facility]   Constipation 06/06/2020   Diabetes, polyneuropathy ([place])    FEET AND FINGER TIPS   Fibromyalgia    GERD (gastroesophageal reflux disease)    History of chronic gastritis    History of COVID-24 Aug 2019   Hyperlipidemia     Hypertension    per pt was take off bp medication because of kidney disease told by her nephrologist   Hypothyroidism    Intractable nausea and vomiting 01/02/2020   Mixed incontinence urge and stress    OA (osteoarthritis)    KNEES   OAB (overactive bladder)    PONV (postoperative nausea and vomiting)    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   Assessment & Plan:   See Encounters Tab for problem based charting.  Patient discussed with Dr.  Jimmye [place]

## 2021-02-17 NOTE — Telephone Encounter (Signed)
Called pt - no answer; left message to give me a call back. 

## 2021-02-18 ENCOUNTER — Encounter: Payer: Self-pay | Admitting: Internal Medicine

## 2021-02-18 NOTE — Assessment & Plan Note (Signed)
Assessment: Patient presents today for further evaluation and management of her right leg pain and weakness.  Patient previously had an MRI performed in 2021 which showed multilevel degenerative disc disease without significant progression.  She does have L5-S1 bilateral subarticular recess stenosis with possible impingement on the S1 nerve root and L4-L5 mild to moderate right foraminal narrowing.  There was some edematous changes of the left piriformis that seem to be subclinical, her symptoms are primarily in the right lower extremity.  Patient was scheduled for repeat MRI yesterday 02/17/2021 to further evaluate her symptoms but was unable able to attend due to a complication with her transportation.  Patient states that her pain and weakness limits her mobility and is requesting further evaluation and management with an orthopedic surgeon.  I discussed the option of possible referral to orthopedist/PMNR for possible spinal injections.  The patient was receptive to this plan.   Plan: - Referral to Ortho/PMNR for spinal injections - Reschedule MRI of lumbar spine

## 2021-02-18 NOTE — Progress Notes (Signed)
Internal Medicine Clinic Attending  Case discussed with Dr. Coe  At the time of the visit.  We reviewed the resident's history and exam and pertinent patient test results.  I agree with the assessment, diagnosis, and plan of care documented in the resident's note.  

## 2021-02-20 ENCOUNTER — Telehealth: Payer: Self-pay | Admitting: Internal Medicine

## 2021-02-20 NOTE — Telephone Encounter (Signed)
Pt is requesting a call back about a letter needing to be written.  Please call the patient back.

## 2021-02-20 NOTE — Telephone Encounter (Signed)
Returned call to patient. States her Case Manager at [employer], Archie Endo, advised her to have her doctor write a letter stating she needs a ramp built on to her sister's home so she will not have to pay $1100 for the lumber. States they will install it for free but she cannot afford the lumber. Call placed to Angie at [employer]. No answer. Left message on VM requesting return call.

## 2021-02-23 NOTE — Telephone Encounter (Addendum)
Angie from [employer] returned call.  States she did an appeal for [facility] and PCS as patient was denied for both. She does not know anything about a ramp or lumber. Call placed to [facility] at [facility] [phone]). States at the assessment for [facility] services, the sister stated she was able to take care of the patient and did not want any outside help. Call placed to patient. No answer. Left message on VM requesting return call.

## 2021-02-23 NOTE — Telephone Encounter (Signed)
Patient called back and several things were discussed:  She agrees that PCS is not needed as her sister does everything for her. She wants her sister to get paid for helping. She is advised to call Angie back to see where they are in this process. She was made aware that Angie did not know about the ramp. Patient will call back when she remembers who asked for the letter. Patient stated she saw orthopedist today and he told her he could not help. He advised referral to Neuro. Confirmed tele appt with Dr. Theodis Shove for 8/24 at 1100.

## 2021-02-23 NOTE — Telephone Encounter (Signed)
Does the patient still need a note for the ramp?

## 2021-02-25 ENCOUNTER — Ambulatory Visit: Payer: Medicare Other | Admitting: Behavioral Health

## 2021-02-25 ENCOUNTER — Telehealth: Payer: Self-pay

## 2021-02-25 DIAGNOSIS — F331 Major depressive disorder, recurrent, moderate: Secondary | ICD-10-CM

## 2021-02-25 DIAGNOSIS — F419 Anxiety disorder, unspecified: Secondary | ICD-10-CM

## 2021-02-25 NOTE — Telephone Encounter (Signed)
Dr. Theodis Shove has requested Toni Hill. [place] to be placed on the wait list. If she can be sooner.  However she will need a 2 day notice because of transportation. Patient's call back phone number is [phone].

## 2021-02-25 NOTE — BH Specialist Note (Signed)
Integrated Behavioral Health via Telemedicine Visit  02/25/2021 Toni Hill [id number]  Number of [place] visits: 7 Session Start time: 11:00am  Session End time: 11:30am Total time: 30  Referring Provider: Dr. Maudie Mercury, MD Patient/Family location: Pt is home in private [facility] Provider location: [place] [place] Office All persons participating in visit: Pt & Clinician Types of Service: Individual psychotherapy  I connected with Toni Hill and/or Toni Hill's  self  via  Telephone or Video Enabled Telemedicine Application  (Video is Caregility application) and verified that I am speaking with the correct person using two identifiers. Discussed confidentiality:  7th visit  I discussed the limitations of telemedicine and the availability of in person appointments.  Discussed there is a possibility of technology failure and discussed alternative modes of communication if that failure occurs.  I discussed that engaging in this telemedicine visit, they consent to the provision of behavioral healthcare and the services will be billed under their insurance.  Patient and/or legal guardian expressed understanding and consented to Telemedicine visit:  7th visit  Presenting Concerns: Patient and/or family reports the following symptoms/concerns: elevated anx/dep due to health status changes this year Duration of problem: since March 2022; Severity of problem: moderate  Patient and/or Family's Strengths/Protective Factors: Social connections and Concrete supports in place (healthy food, safe environments, etc.)  Goals Addressed: Patient will:  Reduce symptoms of: anxiety, depression, mood instability, and stress   Increase knowledge and/or ability of: coping skills, healthy habits, and stress reduction   Demonstrate ability to: Increase healthy adjustment to current life circumstances and Increase adequate support systems for patient/family  Progress towards  Goals: Ongoing  Interventions: Interventions utilized:  Solution-Focused Strategies and Behavioral Activation Standardized Assessments completed:  screeners prn  Patient and/or Family Response: Pt receptive to call today & requests future ck-in appt  Assessment: Patient currently experiencing anx/dep due to health status changes this week. Pt feels multiple losses in Family, her marital relationship, & her own children.  Patient may benefit from cont'd ck-in sessions for mental health wellness.  Plan: Follow up with behavioral health clinician on : 2-3 wks on telehealth ck-in appt Behavioral recommendations: None @ this time Referral(s): [place] (In Clinic)  I discussed the assessment and treatment plan with the patient and/or parent/guardian. They were provided an opportunity to ask questions and all were answered. They agreed with the plan and demonstrated an understanding of the instructions.   They were advised to call back or seek an in-person evaluation if the symptoms worsen or if the condition fails to improve as anticipated.  Donnetta Hutching, LMFT

## 2021-02-27 ENCOUNTER — Encounter: Payer: Self-pay | Admitting: Neurology

## 2021-03-05 ENCOUNTER — Encounter: Payer: Self-pay | Admitting: Internal Medicine

## 2021-03-05 ENCOUNTER — Other Ambulatory Visit: Payer: Self-pay | Admitting: *Deleted

## 2021-03-07 MED ORDER — SIMVASTATIN 40 MG PO TABS: 40.0000 mg | ORAL_TABLET | Freq: Every day | ORAL | 3 refills | Status: DC

## 2021-03-12 ENCOUNTER — Encounter: Payer: Self-pay | Admitting: Dietician

## 2021-03-12 ENCOUNTER — Ambulatory Visit (INDEPENDENT_AMBULATORY_CARE_PROVIDER_SITE_OTHER): Payer: Medicare Other | Admitting: Dietician

## 2021-03-12 DIAGNOSIS — Z794 Long term (current) use of insulin: Secondary | ICD-10-CM

## 2021-03-12 DIAGNOSIS — Z713 Dietary counseling and surveillance: Secondary | ICD-10-CM | POA: Diagnosis not present

## 2021-03-12 DIAGNOSIS — E1140 Type 2 diabetes mellitus with diabetic neuropathy, unspecified: Secondary | ICD-10-CM

## 2021-03-12 NOTE — Patient Instructions (Addendum)
For your constipation:  I suggest trying the Metamucil Fiber Gummies  1- start by eating 1-2 gummies a day. 2- Increase to 3 gummy per week, then increase 1 gummy each week until you have soft stools and no more constipation!  You should be getting the supplements in the next week. I suggest you begin by drinking 1-2 bottles daily as we discussed.   I will let the doctors know you do not think your UTI is better.   I hope your leg pain is better soon!   Butch Penny [phone]

## 2021-03-12 NOTE — Progress Notes (Addendum)
Medical Nutrition Therapy Via Telemedicine (Telephone):  Appt start time: 2637 end time: 8588 Total time: 41 Visit # 2  This is a telephone encounter between [name]  and Butch Penny Matison Nuccio  on 02/12/2021 for Diabetes Self Management Education & Support . The visit was conducted with the patient located at home and [name]  at [facility]. The patient's identity was confirmed using their DOB and current address. The patient has consented to being evaluated through a telephone encounter and understands the associated risks / benefits (allows the patient to remain at home, decreasing exposure to coronavirus). I personally spent 41 minutes on medical nutrition therapy discussion.   The following statements were read to the patient and/or legal guardian that are established with the Nutritional Health Provider.   "The purpose of this phone visit is to provide nutritional health care while limiting exposure to the coronavirus (COVID19).  There is a possibility of technology failure and discussed alternative modes of communication if that failure occurs."   "By engaging in this telephone visit, you consent to the provision of healthcare.  Additionally, you authorize for your insurance to be billed for the services provided during this telephone visit."    Patient and/or legal guardian consented to telephone visit: yes  Assessment:  This is a follow up call for her diabetes and to encourage optimal nutritional status. Improved blood sugars would help maintain her nutritional status and prevent bedsores.    Her primary concerns today: "Leg is really hurting."   She states she is constipated. Wears diapers.  Bladder hurts- thinks she still has a bladder infection. Sees kidney doctor next Thursday. She states that her Leg hurts - has MRI scheduled for 03/30/21. Slept 2-3 hours last night due to pain, oct 25 to see neurologist for leg pain, taking lyrica and does not think it is helping   ANTHROPOMETRICS:  guesses 160#  MEDICATIONS: Meds- switched to Levemir because the other once a day medicne wa snot wokring her blood sugars were "too high"- states she takes 10-15 units insulin at 9-10 pm; usually takes 10 units but f her blood sugar is in the  300s takes 15 units; Takes the other one twice a day before she eats per sliding scale  BLOOD SUGAR: she states thy are "better"; Denies low blood sugars. Values per her report Today [id number]  [phone]  [phone]  [phone]   [phone]  [phone] pm [phone]     '905 pm 463 02/27/21  DIETARY INTAKE: Usual eating pattern includes 3 meals and 1-2 snacks per day.  Avoided foods include sweets, Constipation: yes- BM 2-3x/week Dining Out (times/week): 0 24-hr recall:  Breakfast- 1/2 big bagel, cream cheese, water Lunch- cauliflower  w/ ranch, orange, ham, water Dinner- chicken, potatoes, cauliflower Snacks- Nectarines, banana, strawberries, ice cream, popsicle  Usual physical activity: limited to bed  Progress Towards Goal(s):  In progress.   Nutritional Diagnosis:  Maybee-3.2 Unintentional weight loss As related to her debilitated state and illnesses.  As evidenced by her report and change in documented weight. .  NB 1.4 Nutrition and food related knowledge deficit as related to  her fluctuating blood sugars and not realizing what foods she can eat more of to help stabilize them as evidenced by her report.     Intervention:  Nutrition education about higher protein foods and how to best relieve constipation. Action  Goal: buy and begin taking gummies and when she gets nutritional supplements to start drinking them 1-2 a day Coordination of care: discuss with her doctors  Teaching Method Utilized: Visual, Auditory,Hands on Handouts given during visit include:what to do about high blood sugars, what to eat.  After visit summary Barriers to learning/adherence to lifestyle change: debilitated state,  competing values Demonstrated degree of understanding via:  Teach Back   Monitoring/Evaluation:  Dietary intake, exercise, reader and phone, and body weight in 4 week(s). [name], [place] 03/12/2021 2:01 PM.

## 2021-03-13 ENCOUNTER — Telehealth: Payer: Self-pay

## 2021-03-13 NOTE — Telephone Encounter (Signed)
Pt states she is having leg pain and bladder infection. Requesting to speak with a nurse. Please call back.

## 2021-03-13 NOTE — Telephone Encounter (Signed)
Returned call to patient. States this AM she had very little urine come out when she thought her "bladder was going to burst." She is advised to contact her urologist's office for this. States she will. Also, c/o right leg and right toes hurting. She wanted to confirm her upcoming appts with [facility] and  [facility]. She stated she didn't have a pen to write with and she didn't want to disturb her sister. She needs this information to relay to ambulance company who will be taking her to these appts. Explained she can have ambulance call us and we can relay addresses and appt times. She was very Patent attorney.

## 2021-03-16 ENCOUNTER — Telehealth: Payer: Self-pay

## 2021-03-16 NOTE — Telephone Encounter (Signed)
Return pt's call - no answer. Mailbox is full - unable to leave a message.

## 2021-03-16 NOTE — Telephone Encounter (Signed)
Requesting to speak with a nurse about having leg pain, need med for pain. Please call pt back.

## 2021-03-17 ENCOUNTER — Other Ambulatory Visit: Payer: Self-pay | Admitting: Internal Medicine

## 2021-03-17 DIAGNOSIS — F444 Conversion disorder with motor symptom or deficit: Secondary | ICD-10-CM

## 2021-03-19 ENCOUNTER — Ambulatory Visit: Payer: Medicare Other | Admitting: Neurology

## 2021-03-20 ENCOUNTER — Other Ambulatory Visit: Payer: Self-pay

## 2021-03-20 ENCOUNTER — Ambulatory Visit (INDEPENDENT_AMBULATORY_CARE_PROVIDER_SITE_OTHER): Payer: Medicare Other | Admitting: Internal Medicine

## 2021-03-20 DIAGNOSIS — G6289 Other specified polyneuropathies: Secondary | ICD-10-CM

## 2021-03-20 DIAGNOSIS — M79673 Pain in unspecified foot: Secondary | ICD-10-CM | POA: Diagnosis not present

## 2021-03-20 MED ORDER — PREGABALIN 150 MG PO CAPS
150.0000 mg | ORAL_CAPSULE | Freq: Three times a day (TID) | ORAL | 0 refills | Status: DC
Start: 2021-03-20 — End: 2021-04-28

## 2021-03-20 NOTE — Progress Notes (Signed)
  [facility] Internal [facility] Residency Telephone Encounter Continuity Care Appointment  HPI:  This telephone encounter was created for [name] on 03/20/2021 for the following purpose/cc Leg Pain.   Past Medical History:  Past Medical History:  Diagnosis Date   At risk for adverse drug event 01/22/2019     RIOSORD (Risk Index for Overdose or Serious Opioid -induced Respiratory Depression Risk ) is a tool which determines that risk over the  ensuing 6 months . Based on point total for personal medical co-morbidities and present drug therapies ; the risk : benefit ratio of any drug regimen can be defined and informed consent documented. Reference: [facility] Hospitalist October 2017    Bilateral lower extremity edema    right > left   Bipolar 1 disorder ([place])    CKD (chronic kidney disease) stage 3, GFR 30-59 ml/min (HCC)     Dr Lawson Radar, [facility]   Constipation 06/06/2020   Diabetes, polyneuropathy ([place])    FEET AND FINGER TIPS   Fibromyalgia    GERD (gastroesophageal reflux disease)    History of chronic gastritis    History of COVID-24 Aug 2019   Hyperlipidemia    Hypertension    per pt was take off bp medication because of kidney disease told by her nephrologist   Hypothyroidism    Intractable nausea and vomiting 01/02/2020   Mixed incontinence urge and stress    OA (osteoarthritis)    KNEES   OAB (overactive bladder)    PONV (postoperative nausea and vomiting)    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  Cardiovascular:  Negative for chest pain, palpitations and orthopnea.  Gastrointestinal:  Negative for abdominal pain, constipation, diarrhea, nausea and vomiting.  Musculoskeletal:        Foot pain     Assessment / Plan / Recommendations:  Please see A&P under  problem oriented charting for assessment of the patient's acute and chronic medical conditions.  As always, pt is advised that if symptoms worsen or new symptoms arise, they should go to an urgent care facility or to to ER for further evaluation.   Consent and Medical Decision Making:  Patient discussed with Dr. Jimmye Norman This is a telephone encounter between Toni Hill and Maudie Mercury on 03/20/2021 for Leg pain. The visit was conducted with the patient located at home and Maudie Mercury at [facility]. The patient's identity was confirmed using their DOB and current address. The patient has consented to being evaluated through a telephone encounter and understands the associated risks (an examination cannot be done and the patient may need to come in for an appointment) / benefits (allows the patient to remain at home, decreasing exposure to coronavirus). I personally spent 14 minutes on medical discussion.

## 2021-03-23 NOTE — Progress Notes (Signed)
Internal Medicine Clinic Attending ? ?Case discussed with Dr. Winters  At the time of the visit.  We reviewed the resident?s history and exam and pertinent patient test results.  I agree with the assessment, diagnosis, and plan of care documented in the resident?s note.  ?

## 2021-03-23 NOTE — Assessment & Plan Note (Addendum)
Schedule telehealth visit today to discuss leg pain.  [name] states that her current regimen has not improved her pain.  She was meant to follow-up with PM&R in August, but missed her appointment.  We discussed the importance of keeping with these appointments, to help address her pain.  In the interim, we will increase her Lyrica to 150 3 times daily. - Provided date for next PMR appointment, highly advised patient to present for appointment - Increase Lyrica to 100 mg 3 times daily

## 2021-03-24 ENCOUNTER — Ambulatory Visit: Payer: Medicare Other | Admitting: Behavioral Health

## 2021-04-02 ENCOUNTER — Telehealth: Payer: Self-pay | Admitting: Internal Medicine

## 2021-04-06 NOTE — Telephone Encounter (Signed)
OPEN IN ERROR 

## 2021-04-09 ENCOUNTER — Telehealth: Payer: Self-pay | Admitting: *Deleted

## 2021-04-09 ENCOUNTER — Ambulatory Visit: Payer: Medicare Other | Admitting: Dietician

## 2021-04-09 NOTE — Telephone Encounter (Signed)
Called pt - stated tylenol is helping her leg pain and lyrica does not last. Also stated she has not voided since last night; drinking fluids. She thinks she has an UTI. Stated she has pain bladder area and she had "a very bad infection", went to [facility] vis EMS.

## 2021-04-09 NOTE — Telephone Encounter (Signed)
-----   Message from Resa Miner, RD sent at 04/09/2021  1:49 PM EDT ----- Corrin Parker, I did not start a telephone note. Thank you for helping me with this patient. Butch Penny

## 2021-04-09 NOTE — Progress Notes (Signed)
  Medical Nutrition Therapy Via Telemedicine (Telephone):  I called Toni Hill for our 3rd visit for diabetes support and unintentional weight loss   Her primary concerns today: "Leg is killing me." Increase in Lyrica and tylenol not helping.  This morning couldn't pee. She is tearful. Agrees to triage nurse calling. We rescheduled our visit to 04-23-21 telehealth. Spoke to triage nurse who will follow up with this patient.  [name], RD 04/09/2021 1:27 PM.

## 2021-04-13 ENCOUNTER — Ambulatory Visit: Payer: Medicare Other | Admitting: Behavioral Health

## 2021-04-13 DIAGNOSIS — F319 Bipolar disorder, unspecified: Secondary | ICD-10-CM

## 2021-04-13 NOTE — BH Specialist Note (Signed)
Integrated Behavioral Health via Telemedicine Visit  04/13/2021 Toni Hill [id number]  Number of [place] visits: 8 Session Start time: 2:30pm  Session End time: 3:00pm Total time: 30  Referring Provider: Dr. Thomes Dinning, MD Patient/Family location: Pt is home in private [facility] Provider location: [facility] Office All persons participating in visit: Pt & Clinician Types of Service: Individual psychotherapy  I connected with [name] and/or [name]'s  self  via  Telephone or Video Enabled Telemedicine Application  (Video is Caregility application) and verified that I am speaking with the correct person using two identifiers. Discussed confidentiality:  8th visit  I discussed the limitations of telemedicine and the availability of in person appointments.  Discussed there is a possibility of technology failure and discussed alternative modes of communication if that failure occurs.  I discussed that engaging in this telemedicine visit, they consent to the provision of behavioral healthcare and the services will be billed under their insurance.  Patient and/or legal guardian expressed understanding and consented to Telemedicine visit:  8th visit  Presenting Concerns: Patient and/or family reports the following symptoms/concerns: concerns for cond of R leg Duration of problem: 8 mos; Severity of problem: moderate  Patient and/or Family's Strengths/Protective Factors: Social connections, Concrete supports in place (healthy food, safe environments, etc.), and Sense of purpose  Goals Addressed: Patient will:  Reduce symptoms of: anxiety, depression, and stress   Increase knowledge and/or ability of: coping skills and stress reduction   Demonstrate ability to: Increase healthy adjustment to current life circumstances  Progress towards Goals: Ongoing  Interventions: Interventions utilized:  Behavioral Activation and Supportive Counseling Standardized  Assessments completed:  screeners prn  Patient and/or Family Response: Pt receptive to call today & requests future appt  Assessment: Patient currently experiencing elevated health status changes that concern her daily. Pt expresses, "disgust over her R leg".  Patient may benefit from cont'd psychotherapy sessions for support/encouragement for her mental health wellness.  Plan: Follow up with behavioral health clinician on : 2-3 wks for telehealth visit Behavioral recommendations: Follow instructions of Providers for the health of your leg Referral(s): [place] (In Clinic)  I discussed the assessment and treatment plan with the patient and/or parent/guardian. They were provided an opportunity to ask questions and all were answered. They agreed with the plan and demonstrated an understanding of the instructions.   They were advised to call back or seek an in-person evaluation if the symptoms worsen or if the condition fails to improve as anticipated.  Donnetta Hutching, LMFT

## 2021-04-14 ENCOUNTER — Other Ambulatory Visit: Payer: Self-pay | Admitting: Student

## 2021-04-14 DIAGNOSIS — F444 Conversion disorder with motor symptom or deficit: Secondary | ICD-10-CM

## 2021-04-22 ENCOUNTER — Other Ambulatory Visit: Payer: Self-pay | Admitting: Internal Medicine

## 2021-04-23 ENCOUNTER — Other Ambulatory Visit: Payer: Self-pay | Admitting: Internal Medicine

## 2021-04-23 ENCOUNTER — Ambulatory Visit: Payer: Medicare Other | Admitting: Dietician

## 2021-04-23 NOTE — Progress Notes (Signed)
Medical Nutrition Therapy Via Telemedicine (Telephone):  I called Toni Hill for our 3rd visit for diabetes support and unintentional weight loss:  no answer. I  left her a message for return call [name], RD 04/23/2021 2:10 PM.

## 2021-04-28 ENCOUNTER — Telehealth: Payer: Self-pay

## 2021-04-28 NOTE — Telephone Encounter (Signed)
Pt is requesting an increase from 150 mg Lyrica to 200 mg.

## 2021-04-28 NOTE — Telephone Encounter (Signed)
Requesting pregabalin (LYRICA) 150 MG capsule (Expired to be increased to 200mg , please call pt back.

## 2021-04-28 NOTE — Telephone Encounter (Signed)
Looks like Dr. Gilford Rile just sent her the refills.

## 2021-04-29 ENCOUNTER — Ambulatory Visit: Payer: Medicare Other | Admitting: Neurology

## 2021-04-29 ENCOUNTER — Ambulatory Visit: Payer: Medicare Other | Admitting: Physical Medicine and Rehabilitation

## 2021-04-30 ENCOUNTER — Ambulatory Visit: Payer: Medicare Other | Admitting: Behavioral Health

## 2021-04-30 DIAGNOSIS — F419 Anxiety disorder, unspecified: Secondary | ICD-10-CM

## 2021-04-30 DIAGNOSIS — F331 Major depressive disorder, recurrent, moderate: Secondary | ICD-10-CM

## 2021-04-30 NOTE — BH Specialist Note (Signed)
Integrated Behavioral Health via Telemedicine Visit  04/30/2021 Toni Hill [id number]  Number of [place] visits: 9 Session Start time: 10:40am  Session End time: 11:00am Total time: 20  Referring Provider: Dr. Thomes Dinning, MD Patient/Family location: Pt is in bed @ her Str's home in private [facility] Provider location: [facility] Office All persons participating in visit: Pt & Clinician Types of Service: Individual psychotherapy  I connected with Toni Hill and/or Toni Hill's  self  via  Telephone or Video Enabled Telemedicine Application  (Video is Caregility application) and verified that I am speaking with the correct person using two identifiers. Discussed confidentiality:  9th visit  I discussed the limitations of telemedicine and the availability of in person appointments.  Discussed there is a possibility of technology failure and discussed alternative modes of communication if that failure occurs.  I discussed that engaging in this telemedicine visit, they consent to the provision of behavioral healthcare and the services will be billed under their insurance.  Patient and/or legal guardian expressed understanding and consented to Telemedicine visit:  9th visit  Presenting Concerns: Patient and/or family reports the following symptoms/concerns: elevated lability/emotionality due to the situation w/her L leg Duration of problem: 9+ months; Severity of problem: moderate  Patient and/or Family's Strengths/Protective Factors: Social connections, Concrete supports in place (healthy food, safe environments, etc.), Physical Health (exercise, healthy diet, medication compliance, etc.), and Pt is struggling to find out what is wrong w/her leg-she is unable to ambulate & is confined to bed @ her Str [name]  Goals Addressed: Patient will:  Reduce symptoms of: anxiety, depression, mood instability, and stress   Increase knowledge and/or ability of: coping  skills, stress reduction, and adjustment to the speed of healthcare & all the complications being unable to ambulate have produced for her. Pt has added stressor of living w/her older 74yo str who is also a caregiver for her Husb, as well as Pt    Demonstrate ability to: Increase healthy adjustment to current life circumstances and Begin healthy grieving over loss  Progress towards Goals: Ongoing  Interventions: Interventions utilized:  Supportive Counseling and Psychoeducation and/or Health Education Standardized Assessments completed:  screeners prn  Patient and/or Family Response: Pt is receptive to call today & requests future appt   Assessment: Patient currently experiencing inability to regulate her emotions & inc'd sadness for her current health status.   Patient may benefit from addt'l activities to keep her mind occupied & to f/u with all scheduled appts w/her healthcare Providers.  Plan: Follow up with behavioral health clinician on : 2-3 wks on telehealth for 30 min Behavioral recommendations: Cont to journal as able, do puzzles & find other interesting activities to do while non-ambulatory to reduce stress Referral(s): [place] (In Clinic)  I discussed the assessment and treatment plan with the patient and/or parent/guardian. They were provided an opportunity to ask questions and all were answered. They agreed with the plan and demonstrated an understanding of the instructions.   They were advised to call back or seek an in-person evaluation if the symptoms worsen or if the condition fails to improve as anticipated.  Donnetta Hutching, LMFT

## 2021-05-04 ENCOUNTER — Telehealth: Payer: Self-pay

## 2021-05-04 NOTE — Telephone Encounter (Signed)
Requesting to speak with a nurse about getting medication for leg pain. Please call pt back.

## 2021-05-04 NOTE — Telephone Encounter (Signed)
RTC, patient states the muscle relaxers aren't helping her right leg pain.  She states her "niece takes muscle relaxers and a celebrex together and gets relief from her pain.  She would like to know if PCP thinks this might be a possibility for her. Will send to PCP/team to advise. SChaplin, RN,BSN

## 2021-05-05 NOTE — Telephone Encounter (Signed)
Patient notified of Dr. Lorelee Cover instructions, she verbalized understanding.  Appt offered and she declined. SChaplin, RN,BSN

## 2021-05-08 ENCOUNTER — Ambulatory Visit: Payer: Medicare Other | Admitting: Neurology

## 2021-05-11 ENCOUNTER — Other Ambulatory Visit: Payer: Self-pay | Admitting: Internal Medicine

## 2021-05-11 DIAGNOSIS — F444 Conversion disorder with motor symptom or deficit: Secondary | ICD-10-CM

## 2021-05-21 ENCOUNTER — Ambulatory Visit: Payer: Medicare Other | Admitting: Behavioral Health

## 2021-05-21 DIAGNOSIS — F419 Anxiety disorder, unspecified: Secondary | ICD-10-CM

## 2021-05-21 DIAGNOSIS — F319 Bipolar disorder, unspecified: Secondary | ICD-10-CM

## 2021-05-21 NOTE — BH Specialist Note (Signed)
Integrated Behavioral Health via Telemedicine Visit  05/21/2021 Toni Hill [id number]  Number of [place] visits: 10 Session Start time: 10:00am  Session End time: 10:45am Total time: 45   Referring Provider: Dr. Thomes Dinning, MD Patient/Family location: Pt is home in private; living w/her Str [facility] Provider location: [facility] Office All persons participating in visit: Pt & Clinician Types of Service: Individual psychotherapy  I connected with [name] and/or Karen Chafe Huie's  self  via  Telephone or Video Enabled Telemedicine Application  (Video is Caregility application) and verified that I am speaking with the correct person using two identifiers. Discussed confidentiality:  10th visit  I discussed the limitations of telemedicine and the availability of in person appointments.  Discussed there is a possibility of technology failure and discussed alternative modes of communication if that failure occurs.  I discussed that engaging in this telemedicine visit, they consent to the provision of behavioral healthcare and the services will be billed under their insurance.  Patient and/or legal guardian expressed understanding and consented to Telemedicine visit:  10th visit  Presenting Concerns: Patient and/or family reports the following symptoms/concerns: Pt is worried for the condition of her leg & fearful she may never walk again. Pt was tearful near conclusion of the session due to this constant concern. Pt has Neurology appt in early Dec & hopes to learn more @ that time.  Duration of problem: 9 mos-Pt sts she started having issues w/her leg after the screws in her ankle were removed; Severity of problem: moderate  Patient and/or Family's Strengths/Protective Factors: Social connections, Concrete supports in place (healthy food, safe environments, etc.), and Physical Health (exercise, healthy diet, medication compliance, etc.)  Goals Addressed: Patient will:   Reduce symptoms of: anxiety, depression, and mood instability   Increase knowledge and/or ability of: coping skills, stress reduction, and adjustment to current bed-bound circumstances that have lft Pt anxious, dep'd & despairing w/no knowledge of future outlook as yet    Demonstrate ability to: Increase healthy adjustment to current life circumstances and Begin healthy grieving over loss, inc frustration tolerance & cont to explore childhood Hx  Progress towards Goals: Ongoing  Interventions: Interventions utilized:  CBT Cognitive Behavioral Therapy and Supportive Counseling Standardized Assessments completed:  screeners prn  Patient and/or Family Response: Pt receptive to call today & recounting how transportation was unhelpful in early Nov to get her to her Neurology appt. This has been an appt she was in fair need of to gain some understanding of the situation w/her R leg parallysis. Pt upset due to the confusion the date of this appt & the Transportation Co's lack of understanding.   Pt requests future calls from Clinician.  Assessment: Patient currently experiencing elevated anx/dep while waiting to learn more about the cond of her R leg..   Patient may benefit from cont'd sessions to process the evolving nature of her leg & its functioning.  Plan: Follow up with behavioral health clinician on : 2-3 wks for 30 min on telehealth Behavioral recommendations: Make sure you remind the Transportation Co of your needs the week before your Neurology appt & the day prior. Referral(s): [place] (In Clinic)  I discussed the assessment and treatment plan with the patient and/or parent/guardian. They were provided an opportunity to ask questions and all were answered. They agreed with the plan and demonstrated an understanding of the instructions.   They were advised to call back or seek an in-person evaluation if the  symptoms worsen or if the condition fails to  improve as anticipated.  Donnetta Hutching, LMFT

## 2021-06-02 ENCOUNTER — Telehealth: Payer: Self-pay | Admitting: *Deleted

## 2021-06-02 NOTE — Telephone Encounter (Signed)
Call to patient regarding request for refills from [place].  Need to know if patient is changing Pharmacies.Left message for patient to call the Clinics as soon as Possible Dallas Torok , RN 11.288/2022 2:58 PM.

## 2021-06-02 NOTE — Telephone Encounter (Signed)
RTC from patient.  Patient stated has not changed her Pharmacy to [facility].  Will continue with previous pharmacy in [place].  Sander Nephew, RN 06/02/2021 3:16 PM.

## 2021-06-03 ENCOUNTER — Other Ambulatory Visit: Payer: Self-pay | Admitting: Internal Medicine

## 2021-06-05 ENCOUNTER — Ambulatory Visit (INDEPENDENT_AMBULATORY_CARE_PROVIDER_SITE_OTHER): Payer: Medicare Other | Admitting: Neurology

## 2021-06-05 ENCOUNTER — Other Ambulatory Visit: Payer: Self-pay

## 2021-06-05 ENCOUNTER — Encounter: Payer: Self-pay | Admitting: Neurology

## 2021-06-05 VITALS — BP 108/52 | HR 65 | Ht 69.0 in

## 2021-06-05 DIAGNOSIS — M79604 Pain in right leg: Secondary | ICD-10-CM

## 2021-06-05 DIAGNOSIS — G8311 Monoplegia of lower limb affecting right dominant side: Secondary | ICD-10-CM

## 2021-06-05 DIAGNOSIS — R292 Abnormal reflex: Secondary | ICD-10-CM

## 2021-06-05 NOTE — Progress Notes (Signed)
[facility] Clinic Note - Initial Visit   Date: 06/05/21  [name] MRN: [id number] DOB: 01/16/1946   Dear Dr. Thea Alken:  Thank you for your kind referral of Toni Hill for consultation of right leg weakness. Although her history is well known to you, please allow [place] to reiterate it for the purpose of our medical record. The patient was accompanied to the clinic by self.  She was transported by EMS.    History of Present Illness: Toni Hill is a 75 y.o. female with bipolar disorder, CKD, GERD, fibromyalgia, hypothyroidism, hyperlipidemia, diabetes mellitus, and chronic pain presenting for evaluation of right leg weakness and pain.   Over the past year, she had developed right lower extremity pain and weakness.  Her symptoms started suddenly, she woke up one day with her right leg dysfunctional.  Overtime, she has developed contracture in the right foot. She had MRI brain which was normal, MRI lumbar spine which shows L5-S1 subarticular recess stenosis, otherwise nothing to explain her diffuse symptoms. She saw Dr. Leta Baptist at [facility] whose felt symptoms were more consistent with spasticity and referred to PM&R for management.  She has seen orthopeadics and had CT and x-ray which has been nondiagnostic. Patient has been bed-bound for the past year because she is unable to move her right leg and her foot now has a contracture. She states that her right knee does not bend. She lives at home with her sister who is her primary caregiver.   Out-side paper records, electronic medical record, and images have been reviewed where available and summarized as:  MRI lumbar spine wwocontrast 06/11/2020: 1. Multilevel degenerative disease without progression from 2020. Instead, there has been regression of a L4-5 right-sided disc extrusion since prior. 2. L5-S1 bilateral subarticular recess stenosis which could affect either S1 nerve root, chronic. 3. L4-5 mild to moderate  right foraminal narrowing. 4. Edematous appearance of the partially covered left piriformis, of uncertain importance given contralateral symptoms.   MRI brain wwo contrast 06/11/2020: No acute intracranial abnormality or mass.  Lab Results  Component Value Date   HGBA1C 7.4 (A) 12/16/2020   Lab Results  Component Value Date   VITAMINB12 540 06/11/2020   Lab Results  Component Value Date   TSH 0.104 (L) 12/16/2020   Lab Results  Component Value Date   ESRSEDRATE 25 (H) 03/08/2019    Past Medical History:  Diagnosis Date   At risk for adverse drug event 01/22/2019     RIOSORD (Risk Index for Overdose or Serious Opioid -induced Respiratory Depression Risk ) is a tool which determines that risk over the  ensuing 6 months . Based on point total for personal medical co-morbidities and present drug therapies ; the risk : benefit ratio of any drug regimen can be defined and informed consent documented. Reference: [facility] Hospitalist October 2017    Bilateral lower extremity edema    right > left   Bipolar 1 disorder (HCC)    CKD (chronic kidney disease) stage 3, GFR 30-59 ml/min (HCC)     Dr Lawson Radar, [facility]   Constipation 06/06/2020   Diabetes, polyneuropathy ([place])    FEET AND FINGER TIPS   Fibromyalgia    GERD (gastroesophageal reflux disease)    History of chronic gastritis    History of COVID-24 Aug 2019   Hyperlipidemia    Hypertension    per pt was take off bp medication because of  kidney disease told by her nephrologist   Hypothyroidism    Intractable nausea and vomiting 01/02/2020   Mixed incontinence urge and stress    OA (osteoarthritis)    KNEES   OAB (overactive bladder)    PONV (postoperative nausea and vomiting)    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   Type 2 diabetes mellitus treated with insulin ([place])    Wears glasses     Past Surgical History:  Procedure Laterality Date   CHOLECYSTECTOMY OPEN  1990's    COLONOSCOPY  last one 07-17-2014   CYSTOSCOPY WITH INJECTION N/A 10/29/2016   Procedure: CYSTOSCOPY WITH INJECTION BOTOX 100 UNITS, urethral dilation;  Surgeon: Carolan Clines, MD;  Location: [place];  Service: Urology;  Laterality: N/A;   ESOPHAGOGASTRODUODENOSCOPY  last one 07-01-2016   EXCISIONAL BREAST BX  1990   BENIGN   HARDWARE REMOVAL Right 03/05/2020   Procedure: HARDWARE REMOVAL RIGHT ANKLE;  Surgeon: Leandrew Koyanagi, MD;  Location: [place];  Service: Orthopedics;  Laterality: Right;   ORIF TIBIA & FIBULA FRACTURES  2010   retained rod   TONSILLECTOMY  age 37   TOTAL KNEE ARTHROPLASTY Left 01/15/2019   Procedure: LEFT TOTAL KNEE ARTHROPLASTY;  Surgeon: Leandrew Koyanagi, MD;  Location: [place];  Service: Orthopedics;  Laterality: Left;   VAGINAL HYSTERECTOMY  1979   VENTRAL HERNIA REPAIR  1990's     Medications:  Outpatient Encounter Medications as of 06/05/2021  Medication Sig   acetaminophen (TYLENOL) 500 MG tablet Take 1,000 mg by mouth in the morning and at bedtime.   B-D ULTRAFINE III SHORT PEN 31G X 8 MM MISC 1 EACH BY DOES NOT APPLY ROUTE DAILY. DIAGNOSIS CODE: E11.9. USED TO INJECT INSULIN DAILY.   Blood Glucose Monitoring Suppl (ONETOUCH VERIO REFLECT) w/Device KIT Check blood sugar 3 times a day   Calcium Carb-Cholecalciferol (CALCIUM 600/VITAMIN D3) 600-800 MG-UNIT TABS Take 1 tablet by mouth daily.    ezetimibe (ZETIA) 10 MG tablet TAKE 1 TABLET BY MOUTH EVERY DAY IN THE EVENING   glucose blood (ONETOUCH VERIO) test strip Check blood sugar 3 times a day   insulin degludec (TRESIBA FLEXTOUCH) 100 UNIT/ML FlexTouch Pen Inject 9 Units into the skin daily.   levothyroxine (SYNTHROID) 137 MCG tablet Take 1 tablet (137 mcg total) by mouth daily before breakfast. (Patient taking differently: Take 137 mcg by mouth daily before breakfast. Brand name only)   lidocaine (LIDODERM) 5 % PLACE 1 PATCH ONTO THE SKIN DAILY. REMOVE & DISCARD PATCH WITHIN 12  HOURS OR AS DIRECTED BY MD   ondansetron (ZOFRAN) 4 MG tablet TAKE 1 TABLET BY MOUTH 2 TIMES DAILY.   OneTouch Delica Lancets 41L MISC Check blood sugar 3 times a day   pantoprazole (PROTONIX) 40 MG tablet TAKE 1 TABLET BY MOUTH EVERY DAY   pregabalin (LYRICA) 150 MG capsule TAKE 1 CAPSULE BY MOUTH THREE TIMES A DAY   sertraline (ZOLOFT) 100 MG tablet TAKE 2 TABLETS BY MOUTH EVERY DAY   simvastatin (ZOCOR) 40 MG tablet Take 1 tablet (40 mg total) by mouth daily.   tiZANidine (ZANAFLEX) 4 MG tablet TAKE 1 TABLET (4 MG TOTAL) BY MOUTH IN THE MORNING AND AT BEDTIME.   VITAMIN E PO Take 4,000 Units by mouth 2 (two) times daily.   [DISCONTINUED] HYDROcodone-acetaminophen (NORCO/VICODIN) 5-325 MG tablet Take 1 tablet by mouth every 6 (six) hours as needed for severe pain. (Patient not taking: Reported on 06/05/2021)   [  DISCONTINUED] polyethylene glycol (MIRALAX) 17 g packet Take 17 g by mouth daily. (Patient not taking: Reported on 06/05/2021)   No facility-administered encounter medications on file as of 06/05/2021.    Allergies:  Allergies  Allergen Reactions   Bactrim [Sulfamethoxazole-Trimethoprim]     Patient experienced nephrotoxicity in the setting of CKD. Strongly recommend against using this antibiotic in the future.    Cephalexin Itching   Gabapentin     Kidney issue   Morphine And Related Nausea And Vomiting   Vioxx [Rofecoxib]     GI bleeding   Penicillins Hives and Itching    Has patient had a PCN reaction causing immediate rash, facial/tongue/throat swelling, SOB or lightheadedness with hypotension: No Has patient had a PCN reaction causing severe rash involving mucus membranes or skin necrosis: No Has patient had a PCN reaction that required hospitalization: no Has patient had a PCN reaction occurring within the last 10 years: No If all of the above answers are "NO", then may proceed with Cephalosporin use.     Family History: Family History  Problem Relation Age of Onset    Breast cancer Mother 52   Cancer Father        ? type   Breast cancer Sister 47   Breast cancer Maternal Grandmother        ? age    Social History: Social History   Tobacco Use   Smoking status: Never   Smokeless tobacco: Never  Vaping Use   Vaping Use: Never used  Substance Use Topics   Alcohol use: No   Drug use: No   Social History   Social History Narrative   Left Handed    Lives in a one story home.    Lives with sister    Vital Signs:  BP (!) 108/52   Pulse 65   Ht _0  (1.753 m)   SpO2 96%   BMI 22.45 kg/m   Neurological Exam: MENTAL STATUS including orientation to time, place, person, recent and remote memory, attention span and concentration, language, and fund of knowledge is normal.  Speech is not dysarthric.  CRANIAL NERVES: III-IV-VI:  Normal conjugate, extra-ocular eye movements in all directions of gaze.  No nystagmus.  No ptosis.     VII:  Normal facial symmetry and movements.   VIII:  Normal hearing and vestibular function.   IX-X:  Normal palatal movement.   XI:  Normal shoulder shrug and head rotation.    MOTOR:  No atrophy in the upper extremities. There is significant edema in the legs. No fasciculations or abnormal movements.  No pronator drift.  Upper extremities strength is 5/5 RLE 1/5 proximally, 0/5 below the knee and in the foot. Prominent right foot contracture.  Difficult to assess tone because of pain with any tactile stimuli of the leg and foot LLE 5/5 proximally and distally  MSRs:  Right                          Left                  brachioradialis 2+  2+  biceps 2+  2+  triceps 2+  2+  patellar Unable to assess  3+  ankle jerk Unable to assess  2+  Hoffman no  no  plantar response down  down  Unable to assess reflexes in the RLE Due to pain  SENSORY:  Hyperesthesia to any tactile stimuli in the right  foot andleg. Sensation the arms and LLE intact to vibration.   COORDINATION/GAIT: Unable to assess gait, patient  non-ambulatory and arrived in stretcher   IMPRESSION: Diffuse right leg weakness and pain with right foot contracture. Sudden onset of symptoms is always concerning for vascular event, however MRI brain does not show a stroke and there was no clear evidence of spinal cord infarct. Doubt lumbosacral plexopathy tends to be patchy at this tends to be patchy.  Exam is limited due to pain in the right leg and I cannot accurately assess reflexes or tone.  I will check MRI lumbar spine and MRI thoracic spine to evaluate for cord pathology.  Her right foot contracture is most likely due to nonfunctional limb from shortening of the tendons, I do not think there is underlying spasticity, but again, I was not able to assess her tone.  Unfortunately, her deficits are most likely permanent and unlikely to improve to a point where she is independently functional. She is scheduled to see PM&R for supportive management.  Total time spent reviewing records, interview, history/exam, documentation, counseling, and coordination of care on day of encounter:  45 min    Thank you for allowing me to participate in patient's care.  If I can answer any additional questions, I would be pleased to do so.    Sincerely,    Lakoda Raske K. Posey Pronto, DO

## 2021-06-12 ENCOUNTER — Telehealth: Payer: Self-pay | Admitting: Internal Medicine

## 2021-06-12 DIAGNOSIS — F444 Conversion disorder with motor symptom or deficit: Secondary | ICD-10-CM

## 2021-06-12 NOTE — Telephone Encounter (Signed)
Return pt's call. Stated she only take Tylenol for pain / muscle spasms. Stated she saw the neurologist and she does want to see her again b/c "she act like she didn't want to touch me". Stated she's also using lidocaine patches for pain. Requesting something else for the pain/mus spasms. Stated she prefers to talk to Dr Gilford Rile - informed he will be available on Monday. Thanks

## 2021-06-12 NOTE — Telephone Encounter (Signed)
Pt is requesting a call back about a Pain medication being called in.

## 2021-06-12 NOTE — Telephone Encounter (Signed)
Rec'd a Call from the patient requesting a [facility] Office that is closer to her Home and she is unhappy with her care @ [facility]. The patient was recently seen on 06/05/2021 with their office.  Please advise if a new referral can be placed.

## 2021-06-13 ENCOUNTER — Other Ambulatory Visit (HOSPITAL_COMMUNITY): Payer: Medicare Other

## 2021-06-13 ENCOUNTER — Ambulatory Visit (HOSPITAL_COMMUNITY): Payer: Medicare Other

## 2021-06-15 ENCOUNTER — Telehealth: Payer: Self-pay

## 2021-06-15 NOTE — Telephone Encounter (Signed)
pregabalin (LYRICA) 150 MG capsule, refill request @ [id number] - [place], [place] - [address]  Requesting a call back, want to discuss about something with the nurse. Please call pt back.

## 2021-06-15 NOTE — Telephone Encounter (Signed)
Return pt's call. Informed Lyrica was refilled 06/03/21 x 90 caps. She stated her bottle has a date for Sept; I asked if she had called the pharmacy, stated she did and was told too early. Also informed pt of Dr Jackson Latino response this am about PM&R and of new neuro referral.  [facility] - [facility] [place] stated it's ready for pick-up. Pt was called and informed.

## 2021-06-17 ENCOUNTER — Ambulatory Visit: Payer: Medicare Other | Admitting: Behavioral Health

## 2021-06-17 DIAGNOSIS — F419 Anxiety disorder, unspecified: Secondary | ICD-10-CM

## 2021-06-17 DIAGNOSIS — F319 Bipolar disorder, unspecified: Secondary | ICD-10-CM

## 2021-06-17 DIAGNOSIS — F331 Major depressive disorder, recurrent, moderate: Secondary | ICD-10-CM

## 2021-06-17 NOTE — BH Specialist Note (Signed)
Integrated Behavioral Health via Telemedicine Visit  06/17/2021 Toni Hill [id number]  Number of [place] visits: 11 Session Start time: 2:00pm  Session End time: 2:20pm Total time: 20  Referring Provider: dr. Thomes Dinning, MD Patient/Family location: Pt is in private @ her Str's home [facility] Provider location: [facility] All persons participating in visit: Pt & Clinician Types of Service: Health Promotion  I connected with [name] and/or Karen Chafe Blauvelt's  self  via  Telephone or Video Enabled Telemedicine Application  (Video is Caregility application) and verified that I am speaking with the correct person using two identifiers. Discussed confidentiality:  11th visit  I discussed the limitations of telemedicine and the availability of in person appointments.  Discussed there is a possibility of technology failure and discussed alternative modes of communication if that failure occurs.  I discussed that engaging in this telemedicine visit, they consent to the provision of behavioral healthcare and the services will be billed under their insurance.  Patient and/or legal guardian expressed understanding and consented to Telemedicine visit:  11th visit  Presenting Concerns: Patient and/or family reports the following symptoms/concerns: concerns for upcoming Neuro appt where Pt thinks an MRI will be done w/Provider & Practice she does not like. Pt cond was treated painfully & Pt did not speak up to communicate w/Provider the manipulation of her toe hurt. Duration of problem: 9 mos; Severity of problem: moderate trending severe; Pt is fearful she, "will be crippled the rest of her life".  Patient and/or Family's Strengths/Protective Factors: Concrete supports in place (healthy food, safe environments, etc.) and Pt is persistent w/her requests.  Goals Addressed: Patient will:  Reduce symptoms of: anxiety, depression, mood instability, and stress   Increase  knowledge and/or ability of: coping skills, stress reduction, and emot'l regulation skills    Demonstrate ability to: Increase healthy adjustment to current life circumstances and Begin healthy grieving over loss  Progress towards Goals: Ongoing  Interventions: Interventions utilized:  Behavioral Activation and Supportive Counseling Standardized Assessments completed:  screeners prn  Patient and/or Family Response: Pt receptive to call & requests future ck-in appts  Assessment: Patient currently experiencing inc'd emotional lability which seems consistent w/her presentation of Bipolar d/o.   Patient may benefit from cont'd treatment to teach & implement skills that complement her coping w/current Px condition.   Plan: Follow up with behavioral health clinician on : First avail in Jan for 30 min telehealth visit Behavioral recommendations: Keep your mind busy & occupied using the tools we have discussed. Referral(s): [place] (In Clinic)  I discussed the assessment and treatment plan with the patient and/or parent/guardian. They were provided an opportunity to ask questions and all were answered. They agreed with the plan and demonstrated an understanding of the instructions.   They were advised to call back or seek an in-person evaluation if the symptoms worsen or if the condition fails to improve as anticipated.  Donnetta Hutching, LMFT

## 2021-06-19 ENCOUNTER — Ambulatory Visit (HOSPITAL_COMMUNITY)
Admission: RE | Admit: 2021-06-19 | Discharge: 2021-06-19 | Disposition: A | Discharge status: Home / Self Care | Payer: Medicare Other | Source: Ambulatory Visit | Attending: Neurology | Admitting: Neurology

## 2021-06-19 ENCOUNTER — Other Ambulatory Visit: Payer: Self-pay

## 2021-06-19 DIAGNOSIS — G8311 Monoplegia of lower limb affecting right dominant side: Secondary | ICD-10-CM | POA: Diagnosis present

## 2021-06-19 DIAGNOSIS — M79604 Pain in right leg: Secondary | ICD-10-CM

## 2021-06-19 DIAGNOSIS — R292 Abnormal reflex: Secondary | ICD-10-CM

## 2021-06-21 ENCOUNTER — Encounter: Payer: Self-pay | Admitting: Diagnostic Neuroimaging

## 2021-06-22 ENCOUNTER — Encounter: Payer: Medicare Other | Admitting: Physical Medicine and Rehabilitation

## 2021-06-22 ENCOUNTER — Ambulatory Visit: Payer: Medicare Other | Admitting: Physical Medicine and Rehabilitation

## 2021-06-23 ENCOUNTER — Telehealth: Payer: Self-pay

## 2021-06-23 DIAGNOSIS — F444 Conversion disorder with motor symptom or deficit: Secondary | ICD-10-CM

## 2021-06-23 NOTE — Telephone Encounter (Signed)
Requesting MRI results, please call pt back.  

## 2021-06-25 NOTE — Telephone Encounter (Signed)
Called to discuss results with Ms. Caldas.  Confirmed identity with name and DOB.   Discussed MRI results.  Patient is not pleased with her recent evaluation at [facility] neurology.  Reviewed not from that physician and patient was not able to participate in exam based on the note.  MRI of the L and T spine did not show any new pathology.  Patient feels she should have an MRI of the leg itself and I recommended she see Orthopedics.  She notes that she has seen them and they thought she should see Neurology.  She has also been referred to PM&R but cannot be seen until March.  She is in pain and requesting pain medication.  She was not aware that her lyrica was a neuropathic pain medication and that this is the appropriate therapy based on my review of the chart.  Narcotics are not indicated.  She is requesting a second opinion from a different neurology group and I will order that today.  Overall, it does not appear that there will be a clear answer in this case.  We would need to see her in person for a more thorough evaluation and change in medications.   Gilles Chiquito, MD

## 2021-06-25 NOTE — Telephone Encounter (Signed)
Pt rtn call . Pt states no one has called her about her MRi results and it has been 2 days .  Please call the patient back.

## 2021-06-30 ENCOUNTER — Telehealth: Payer: Self-pay | Admitting: Neurology

## 2021-06-30 ENCOUNTER — Telehealth: Payer: Self-pay | Admitting: *Deleted

## 2021-06-30 NOTE — Telephone Encounter (Signed)
Patient called requesting a MRI of her leg.  MRI of back was done patient stated  if she had known she would not have gotten that 1 done..  C/O of severe pain in right leg.  When asked if there is any color changes in leg.  Stated is unable to see.   Has an appointment with pain management in March.  Marland Kitchen

## 2021-06-30 NOTE — Telephone Encounter (Signed)
Patient was extremely overwhelmed on the phone when she called. She said she missed a call but doesn't know who it was from. She stated she hit the button and we were the last ones called. She was crying on the phone, said she needed an MRI of her leg not back.

## 2021-07-01 NOTE — Telephone Encounter (Signed)
See result note.  

## 2021-07-13 ENCOUNTER — Ambulatory Visit (INDEPENDENT_AMBULATORY_CARE_PROVIDER_SITE_OTHER): Payer: Medicare Other | Admitting: Internal Medicine

## 2021-07-13 DIAGNOSIS — Z0000 Encounter for general adult medical examination without abnormal findings: Secondary | ICD-10-CM

## 2021-07-13 DIAGNOSIS — R112 Nausea with vomiting, unspecified: Secondary | ICD-10-CM | POA: Diagnosis not present

## 2021-07-13 NOTE — Progress Notes (Signed)
°  [facility] Residency Telephone Encounter Continuity Care Appointment  HPI:  This telephone encounter was created for [name] on 07/13/2021 for the following purpose/cc Vomiting.    Past Medical History:  Past Medical History:  Diagnosis Date   At risk for adverse drug event 01/22/2019     RIOSORD (Risk Index for Overdose or Serious Opioid -induced Respiratory Depression Risk ) is a tool which determines that risk over the  ensuing 6 months . Based on point total for personal medical co-morbidities and present drug therapies ; the risk : benefit ratio of any drug regimen can be defined and informed consent documented. Reference: [facility] Hospitalist October 2017    Bilateral lower extremity edema    right > left   Bipolar 1 disorder ([place])    CKD (chronic kidney disease) stage 3, GFR 30-59 ml/min (HCC)     Dr Lawson Radar, [facility]   Constipation 06/06/2020   Diabetes, polyneuropathy ([place])    FEET AND FINGER TIPS   Fibromyalgia    GERD (gastroesophageal reflux disease)    History of chronic gastritis    History of COVID-24 Aug 2019   Hyperlipidemia    Hypertension    per pt was take off bp medication because of kidney disease told by her nephrologist   Hypothyroidism    Intractable nausea and vomiting 01/02/2020   Mixed incontinence urge and stress    OA (osteoarthritis)    KNEES   OAB (overactive bladder)    PONV (postoperative nausea and vomiting)    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   Type 2 diabetes mellitus treated with insulin ([place])    Wears glasses      ROS:  Review of Systems  Constitutional:  Negative for chills, diaphoresis, fever, malaise/fatigue and weight loss.  Respiratory:  Negative for cough and hemoptysis.   Cardiovascular:  Negative for chest pain and palpitations.  Gastrointestinal:  Positive for nausea and vomiting. Negative for abdominal pain, blood in stool, constipation, diarrhea,  heartburn and melena.  Musculoskeletal:  Positive for joint pain. Negative for back pain, myalgias and neck pain.  Skin:  Negative for itching and rash.     Assessment / Plan / Recommendations:  Please see A&P under problem oriented charting for assessment of the patient's acute and chronic medical conditions.  As always, pt is advised that if symptoms worsen or new symptoms arise, they should go to an urgent care facility or to to ER for further evaluation.   Consent and Medical Decision Making:  Patient discussed with Dr. Jimmye Norman This is a telephone encounter between Toni Hill and Maudie Mercury on 07/13/2021 for Vomiting. The visit was conducted with the patient located at home and Maudie Mercury at [facility]. The patient's identity was confirmed using their DOB and current address. The patient has consented to being evaluated through a telephone encounter and understands the associated risks (an examination cannot be done and the patient may need to come in for an appointment) / benefits (allows the patient to remain at home, decreasing exposure to coronavirus). I personally spent 14 minutes on medical discussion.

## 2021-07-14 ENCOUNTER — Telehealth: Payer: Self-pay | Admitting: *Deleted

## 2021-07-14 DIAGNOSIS — Z0000 Encounter for general adult medical examination without abnormal findings: Secondary | ICD-10-CM | POA: Insufficient documentation

## 2021-07-14 NOTE — Assessment & Plan Note (Signed)
In addition to her vomiting episodes, we had a discussion that due to the complexity of her case and the issues with transportation that has limited Toni Hill in being able to attend her appointments that [facility] may be of benefit for her, as she does require frequent visits. After discussing [facility], she would be interested in enrolling in the program, and asked for me to reach out on her behalf.  - Spoke with [facility], they will reach out to Toni Hill.

## 2021-07-14 NOTE — Assessment & Plan Note (Signed)
Patient presents for a telehealth visit for nasuea and vomiting over this past weekend. [name] states that she had several episodes of vomitus, at random, that was green in color. She denies any fevers, abdominal pain, diarrhea associated with these episodes. [name] states that her vomiting is lessening, and has only had one episode of vomiting this morning. She is unable to recall if she has been eating left over food. She denies coming with contact with any sick individuals.  A/P:  Patient presents for telehealth clinic for episodes of resolving vomiting over the weekend. She does not report fevers, abdominal pain, or diarrhea and given that her symptoms are resolving this is a reassuring sign and this may be viral in origin.   We further discussed coming into the clinic as this may be a symptoms of gastroparesis, and she is due for a follow up for her diabetes. Patient is agreeable to an office visit this Thursday. If her symptoms worsen she was instructed to be evaluated in the ED, for which she is agreeable.  - Office visit 07/16/21

## 2021-07-14 NOTE — Telephone Encounter (Signed)
Call from pt's sister,[name]- concerned b/c pt cannot stay awake. Stated she has been groggy; she has to be shaken to wake her up to eat.I asked her sister to check pt's BS now - it's 253. Stated pt knows who she is. Stated pt is sleeping more than usual -wanted the doctor to be aware. And Jana Half would like for Dr Gilford Rile to call her back on her phone [phone]. Thanks

## 2021-07-15 ENCOUNTER — Telehealth: Payer: Self-pay | Admitting: Behavioral Health

## 2021-07-15 ENCOUNTER — Ambulatory Visit: Payer: Medicare Other | Admitting: Behavioral Health

## 2021-07-15 DIAGNOSIS — F331 Major depressive disorder, recurrent, moderate: Secondary | ICD-10-CM

## 2021-07-15 DIAGNOSIS — F419 Anxiety disorder, unspecified: Secondary | ICD-10-CM

## 2021-07-15 NOTE — BH Specialist Note (Signed)
Integrated Behavioral Health via Telemedicine Visit  07/15/2021 Toni Hill [id number]  Number of [place] visits: 12 Session Start time: 3:30pm  Session End time: 4:00pm Total time: 30  Referring Provider: Dr. Thomes Dinning, MD Patient/Family location: Pt @ home in private [facility] Provider location: [facility] Office  All persons participating in visit: Pt & Clinician Types of Service: Individual psychotherapy  I connected with Toni Hill and/or Toni Hill's  self  via  Telephone or Video Enabled Telemedicine Application  (Video is Caregility application) and verified that I am speaking with the correct person using two identifiers. Discussed confidentiality:  12th visit  I discussed the limitations of telemedicine and the availability of in person appointments.  Discussed there is a possibility of technology failure and discussed alternative modes of communication if that failure occurs.  I discussed that engaging in this telemedicine visit, they consent to the provision of behavioral healthcare and the services will be billed under their insurance.  Patient and/or legal guardian expressed understanding and consented to Telemedicine visit:  12th visit  Presenting Concerns: Patient and/or family reports the following symptoms/concerns: reduction in anx/dep due to recent conversation w/Str about her residence in her home Duration of problem: months; Severity of problem: moderate  Pt was not arousable on Tues, 07/14/2021. Called to speak w/Str Toni Hill today to ck on Pt. Pt took medicines incorrectly & this made her drowsy. Today, she is back on track & fulled lucid.   Patient and/or Family's Strengths/Protective Factors: Concrete supports in place (healthy food, safe environments, etc.) and Sense of purpose  Goals Addressed: Patient will:  Reduce symptoms of: anxiety and depression   Increase knowledge and/or ability of: coping skills and stress reduction    Demonstrate ability to: Increase healthy adjustment to current life circumstances  Progress towards Goals: Ongoing  Interventions: Interventions utilized:  Supportive Counseling Standardized Assessments completed: screeners prn  Patient and/or Family Response: Pt receptive to call @ 3:30pm - 4:00pm due to missing appt earlier today @ 1:00pm. Pt requests future appt  Assessment: Patient currently experiencing some anx/dep & disappointment w/her children not contacting her for months.   Patient may benefit from cont'd ck-in appts for mental health wellness & coping w/her current Px condition that is limiting.  Plan: Follow up with behavioral health clinician on : 2 wks for 30 min on telehealth Behavioral recommendations: None today Referral(s): [place] (In Clinic)  I discussed the assessment and treatment plan with the patient and/or parent/guardian. They were provided an opportunity to ask questions and all were answered. They agreed with the plan and demonstrated an understanding of the instructions.   They were advised to call back or seek an in-person evaluation if the symptoms worsen or if the condition fails to improve as anticipated.  Toni Hutching, LMFT

## 2021-07-15 NOTE — Telephone Encounter (Signed)
Lft msgs for Pt today re: IBH telehealth visit. Clinician will r/s Pt for another time.  Dr. Theodis Shove

## 2021-07-16 ENCOUNTER — Encounter: Payer: Self-pay | Admitting: Internal Medicine

## 2021-07-16 ENCOUNTER — Other Ambulatory Visit: Payer: Self-pay

## 2021-07-16 ENCOUNTER — Ambulatory Visit (INDEPENDENT_AMBULATORY_CARE_PROVIDER_SITE_OTHER): Payer: Medicare Other | Admitting: Internal Medicine

## 2021-07-16 VITALS — BP 118/54 | HR 77 | Temp 98.4°F | Resp 24 | Ht 69.0 in

## 2021-07-16 DIAGNOSIS — I10 Essential (primary) hypertension: Secondary | ICD-10-CM

## 2021-07-16 DIAGNOSIS — E039 Hypothyroidism, unspecified: Secondary | ICD-10-CM | POA: Diagnosis not present

## 2021-07-16 DIAGNOSIS — I129 Hypertensive chronic kidney disease with stage 1 through stage 4 chronic kidney disease, or unspecified chronic kidney disease: Secondary | ICD-10-CM

## 2021-07-16 DIAGNOSIS — N183 Chronic kidney disease, stage 3 unspecified: Secondary | ICD-10-CM

## 2021-07-16 DIAGNOSIS — E1140 Type 2 diabetes mellitus with diabetic neuropathy, unspecified: Secondary | ICD-10-CM | POA: Diagnosis present

## 2021-07-16 DIAGNOSIS — Z0000 Encounter for general adult medical examination without abnormal findings: Secondary | ICD-10-CM

## 2021-07-16 DIAGNOSIS — R112 Nausea with vomiting, unspecified: Secondary | ICD-10-CM

## 2021-07-16 DIAGNOSIS — E1122 Type 2 diabetes mellitus with diabetic chronic kidney disease: Secondary | ICD-10-CM

## 2021-07-16 DIAGNOSIS — Z794 Long term (current) use of insulin: Secondary | ICD-10-CM

## 2021-07-16 DIAGNOSIS — E785 Hyperlipidemia, unspecified: Secondary | ICD-10-CM | POA: Diagnosis not present

## 2021-07-16 LAB — POCT GLYCOSYLATED HEMOGLOBIN (HGB A1C): Hemoglobin A1C: 7.8 % — AB (ref 4.0–5.6)

## 2021-07-16 LAB — GLUCOSE, CAPILLARY: Glucose-Capillary: 129 mg/dL — ABNORMAL HIGH (ref 70–99)

## 2021-07-16 MED ORDER — TRESIBA FLEXTOUCH 100 UNIT/ML ~~LOC~~ SOPN: 12.0000 [IU] | PEN_INJECTOR | Freq: Every day | SUBCUTANEOUS | 2 refills | Status: DC

## 2021-07-16 MED ORDER — EZETIMIBE 10 MG PO TABS: ORAL_TABLET | ORAL | 1 refills | Status: DC

## 2021-07-16 NOTE — Patient Instructions (Addendum)
To Bianney,   It was so nice seeing you again today! Today we discussed your diabetes, hypothyroidism, cholesterol levels, recent vomiting, and the need for ongoing care.   For your diabetes, after looking at your monitor, we are going to increase your insulin to 12 Units daily. We will see you back in 3-4 months.   For your thyroid, I am going to check your thyroid levels and adjust your medications depending on your labs.   For your cholesterol, I have reordered your Zetia, and will be checking your cholesterol levels.   I am glad that your vomiting from the weekend has improved!   Lastly, we discussed your ongoing healthcare needs. I believe that [facility] will be a good fit for consistent care and transportation needs. This service will help you make your PM&R appointments and have consistent follow up. I am going to call them back to make sure they get in touch with your, but I am also going to provide a handout with their services and information to you as well.   I will call you back with your results!

## 2021-07-17 ENCOUNTER — Encounter: Payer: Self-pay | Admitting: Internal Medicine

## 2021-07-17 LAB — LIPID PANEL
Chol/HDL Ratio: 4.8 ratio — ABNORMAL HIGH (ref 0.0–4.4)
Cholesterol, Total: 211 mg/dL — ABNORMAL HIGH (ref 100–199)
HDL: 44 mg/dL (ref >39)
LDL Chol Calc (NIH): 123 mg/dL — ABNORMAL HIGH (ref 0–99)
Triglycerides: 252 mg/dL — ABNORMAL HIGH (ref 0–149)
VLDL Cholesterol Cal: 44 mg/dL — ABNORMAL HIGH (ref 5–40)

## 2021-07-17 LAB — BMP8+ANION GAP
Anion Gap: 14 mmol/L (ref 10.0–18.0)
BUN/Creatinine Ratio: 29 — ABNORMAL HIGH (ref 12–28)
BUN: 33 mg/dL — ABNORMAL HIGH (ref 8–27)
CO2: 28 mmol/L (ref 20–29)
Calcium: 9.2 mg/dL (ref 8.7–10.3)
Chloride: 105 mmol/L (ref 96–106)
Creatinine, Ser: 1.14 mg/dL — ABNORMAL HIGH (ref 0.57–1.00)
Glucose: 145 mg/dL — ABNORMAL HIGH (ref 70–99)
Potassium: 5 mmol/L (ref 3.5–5.2)
Sodium: 147 mmol/L — ABNORMAL HIGH (ref 134–144)
eGFR: 51 mL/min/{1.73_m2} — ABNORMAL LOW (ref >59)

## 2021-07-17 LAB — TSH: TSH: 1.79 u[IU]/mL (ref 0.450–4.500)

## 2021-07-17 MED ORDER — SIMVASTATIN 40 MG PO TABS: 40.0000 mg | ORAL_TABLET | Freq: Every day | ORAL | 3 refills | Status: DC

## 2021-07-17 MED ORDER — ONDANSETRON HCL 4 MG PO TABS: 4.0000 mg | ORAL_TABLET | Freq: Two times a day (BID) | ORAL | 3 refills | Status: AC | PRN

## 2021-07-17 NOTE — Assessment & Plan Note (Signed)
We had a in depth discussion at today's visit concerning Toni Hill continued medical needs, increased frequency of follow up, and missing appointment 2/2 to not having adequate transportation. We further discussed [facility], which she was hesitant to consider as she believed it was a retirement community and she would like to be with her family. We discussed that the [facility] program is not a retirement program, but is one that is meant to help higher care patient's keep their independence. Toni Hill was happy to hear that it was not a retirement community, and would like to speak with a representative. I have stapled a copy of the [facility] program to her visit summary and I will reach out to [facility] on [facility] behalf.

## 2021-07-17 NOTE — Assessment & Plan Note (Addendum)
Patient presents today for routine care. 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 She has been unable to make several appointments, but will assess kidney function today.  - BMP - Patient to follow back with [facility]

## 2021-07-17 NOTE — Progress Notes (Signed)
CC: Telehealth follow up  HPI:  Toni Hill is a 76 y.o. person, with a PMH noted below, who presents to the clinic telehealth follow up. To see the management of their acute and chronic conditions, please see the A&P note under the Encounters tab.   Past Medical History:  Diagnosis Date   At risk for adverse drug event 01/22/2019     RIOSORD (Risk Index for Overdose or Serious Opioid -induced Respiratory Depression Risk ) is a tool which determines that risk over the  ensuing 6 months . Based on point total for personal medical co-morbidities and present drug therapies ; the risk : benefit ratio of any drug regimen can be defined and informed consent documented. Reference: [facility] Hospitalist October 2017    Bilateral lower extremity edema    right > left   Bipolar 1 disorder ([place])    CKD (chronic kidney disease) stage 3, GFR 30-59 ml/min (HCC)     Dr Lawson Radar, [facility]   Constipation 06/06/2020   Diabetes, polyneuropathy ([place])    FEET AND FINGER TIPS   Fibromyalgia    GERD (gastroesophageal reflux disease)    History of chronic gastritis    History of COVID-24 Aug 2019   Hyperlipidemia    Hypertension    per pt was take off bp medication because of kidney disease told by her nephrologist   Hypothyroidism    Intractable nausea and vomiting 01/02/2020   Mixed incontinence urge and stress    OA (osteoarthritis)    KNEES   OAB (overactive bladder)    PONV (postoperative nausea and vomiting)    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  Eyes:  Negative for blurred vision, double vision, photophobia and pain.  Respiratory:  Negative for cough and hemoptysis.   Cardiovascular:  Negative for chest pain, palpitations and orthopnea.  Gastrointestinal:   Negative for abdominal pain, constipation, diarrhea, nausea and vomiting.  Musculoskeletal:  Negative for back pain, falls, myalgias and neck pain.       RLE muscle spasms  Neurological:  Negative for dizziness, tingling, tremors and headaches.    Physical Exam:  Vitals:   07/16/21 1440  BP: (!) 118/54  Pulse: 77  Resp: (!) 24  Temp: 98.4 F (36.9 C)  TempSrc: Oral  SpO2: 98%  Height: 5\' 9"  (1.753 m)   Physical Exam Constitutional:      General: She is not in acute distress.    Appearance: Normal appearance. She is not ill-appearing or toxic-appearing.  HENT:     Head: Normocephalic and atraumatic.  Cardiovascular:     Rate and Rhythm: Normal rate and regular rhythm.     Pulses: Normal pulses.     Heart sounds: Normal heart sounds. No murmur heard.   No friction rub. No gallop.  Pulmonary:     Effort: Pulmonary effort is normal.     Breath sounds: Normal breath sounds. No stridor. No wheezing, rhonchi or rales.  Chest:     Chest wall: No tenderness.  Abdominal:     General: Abdomen is flat. Bowel sounds are normal. There is no distension.     Palpations: Abdomen is soft.     Tenderness: There is no abdominal  tenderness.  Musculoskeletal:        General: No swelling, tenderness or signs of injury.     Right lower leg: No edema.     Left lower leg: No edema.     Comments: RLE contracted, non tender to palpation Strength 1/5 LLE 5/5  Neurological:     General: No focal deficit present.     Mental Status: She is alert and oriented to person, place, and time.  Psychiatric:        Mood and Affect: Mood normal.        Behavior: Behavior normal.     Assessment & Plan:   See Encounters Tab for problem based charting.  Patient discussed with Dr. Heber Waltham

## 2021-07-17 NOTE — Assessment & Plan Note (Signed)
Vitals with BMI 07/16/2021 06/05/2021 02/03/2021  Height 5\' 9"  5\' 9"  -  Weight - (No Data) -  BMI - - -  Systolic 423 702 97  Diastolic 54 52 63  Pulse 77 65 86   Patient continues to do well after discontinuing amlodipine at her last visit.  -Continue to monitor vitals

## 2021-07-17 NOTE — Assessment & Plan Note (Signed)
Patient presents for follow up on her DMTII. She states that her home blood sugars have been high since changing to [place] 9 units, but she is not noticing any hypoglycemic events. She brings in her meter which shows:  Very high 30% (>250) High 33% (181-250) Target Range 35% (70-180) Low 2% (54-69)   We will assess an A1c today, increase her basal insulin, as patient was having multiple hypoglycemic events on sliding scale, we have tried GLP-1 in the past, but this was DC secondary to side effect profile.  - Increase Tresiba to 12 units - A1c today - Follow up in 3 months with PCP

## 2021-07-17 NOTE — Assessment & Plan Note (Signed)
Currently on Synthroid 137 mcg daily. Patient is compliant with her medications and is not experiencing any side effects. Will check TSH level today.  - Continue Synthroid 137 mg daily - TSH today

## 2021-07-17 NOTE — Assessment & Plan Note (Signed)
Resolved

## 2021-07-17 NOTE — Assessment & Plan Note (Addendum)
Toni Hill presents with a history of HLD on Zetia. She has been out of her Zetia and simvastatin for at least "several months." We will restart today and obtain a screening lipid panel.  - Lipid panel - Refill Zetia 10 mg daily - Refill Simvastatin 40 mg daily  Addendum:  Spoke with patient about results, have initiated high intensity statin and Zetia. ASCVD risk of 12.3%

## 2021-07-18 ENCOUNTER — Other Ambulatory Visit: Payer: Self-pay | Admitting: Student

## 2021-07-22 ENCOUNTER — Telehealth: Payer: Self-pay | Admitting: Dietician

## 2021-07-22 NOTE — Telephone Encounter (Signed)
Toni Hill calls saying she needs CGM supplies and asks if we have paperwork from [facility]. She was informed that we do not and to call them and ask them to refax the papers to our office.

## 2021-07-22 NOTE — Progress Notes (Signed)
Internal Medicine Clinic Attending  Case discussed with Dr. Gilford Rile  At the time of the visit.  We reviewed the residents history and exam and pertinent patient test results.  I agree with the assessment, diagnosis, and plan of care documented in the residents note. Ms. Hoeschen would certainly benefit from the comprehensive team based approach to care at [facility].  I hope it works out for her.

## 2021-07-22 NOTE — Progress Notes (Signed)
Internal Medicine Clinic Attending ? ?Case discussed with Dr. Winters  At the time of the visit.  We reviewed the resident?s history and exam and pertinent patient test results.  I agree with the assessment, diagnosis, and plan of care documented in the resident?s note.  ?

## 2021-07-29 ENCOUNTER — Telehealth: Payer: Self-pay | Admitting: Internal Medicine

## 2021-07-29 NOTE — Telephone Encounter (Signed)
The patient is requesting a call back.  The pt has now decided she will be ok with going to a [place] and wants to know what her next steps are.

## 2021-07-31 ENCOUNTER — Other Ambulatory Visit: Payer: Self-pay | Admitting: Student

## 2021-08-10 ENCOUNTER — Ambulatory Visit: Payer: Medicare Other | Admitting: Behavioral Health

## 2021-08-10 DIAGNOSIS — F331 Major depressive disorder, recurrent, moderate: Secondary | ICD-10-CM

## 2021-08-10 DIAGNOSIS — F419 Anxiety disorder, unspecified: Secondary | ICD-10-CM

## 2021-08-10 NOTE — BH Specialist Note (Signed)
Integrated Behavioral Health via Telemedicine Visit  08/10/2021 MAURISA TESMER [id number]  Number of [place] visits: 41 Session Start time: 1:30pm  Session End time: 2:00pm Total time: 30  Referring Provider: Dr. Thomes Dinning, MD Patient/Family location: Pt is homebound @ her Str's home who is her Caregiver [facility] Provider location: [facility] Office All persons participating in visit: Pt & Clinician Types of Service: Individual psychotherapy  I connected with [name] and/or Karen Chafe Littman's  self  via  Telephone or Video Enabled Telemedicine Application  (Video is Caregility application) and verified that I am speaking with the correct person using two identifiers. Discussed confidentiality:  12th visit  I discussed the limitations of telemedicine and the availability of in person appointments.  Discussed there is a possibility of technology failure and discussed alternative modes of communication if that failure occurs.  I discussed that engaging in this telemedicine visit, they consent to the provision of behavioral healthcare and the services will be billed under their insurance.  Patient and/or legal guardian expressed understanding and consented to Telemedicine visit:  12th visit  Presenting Concerns: Patient and/or family reports the following symptoms/concerns: inc'd anx/dep over her Px circumstances which make her bedbound Duration of problem: almost a year; Severity of problem: moderate  Patient and/or Family's Strengths/Protective Factors: Social and Emotional competence and Concrete supports in place (healthy food, safe environments, etc.)  Goals Addressed: Patient will:  Reduce symptoms of: anxiety, depression, and stress   Increase knowledge and/or ability of: coping skills and stress reduction   Demonstrate ability to: Increase healthy adjustment to current life circumstances  Progress towards Goals: Ongoing  Interventions: Interventions  utilized:  Supportive Counseling Standardized Assessments completed:  screeners prn  Patient and/or Family Response: Pt is receptive to call today & requests future appt  Assessment: Patient currently experiencing inc in confidence since she has decided to go to a [facility] to get PT & TLC from [facility].   Patient may benefit from cont'd ck-in calls for mental health wellness support.  Plan: Follow up with behavioral health clinician on : 2-3 wks on telehealth for 30 min Behavioral recommendations: Continue w/your mind exercises on the phone Referral(s): [place] (In Clinic)  I discussed the assessment and treatment plan with the patient and/or parent/guardian. They were provided an opportunity to ask questions and all were answered. They agreed with the plan and demonstrated an understanding of the instructions.   They were advised to call back or seek an in-person evaluation if the symptoms worsen or if the condition fails to improve as anticipated.  Donnetta Hutching, LMFT

## 2021-08-17 ENCOUNTER — Telehealth: Payer: Self-pay | Admitting: Behavioral Health

## 2021-08-17 ENCOUNTER — Telehealth: Payer: Self-pay | Admitting: *Deleted

## 2021-08-17 ENCOUNTER — Telehealth: Payer: Self-pay | Admitting: Internal Medicine

## 2021-08-17 DIAGNOSIS — R5381 Other malaise: Secondary | ICD-10-CM

## 2021-08-17 NOTE — Telephone Encounter (Signed)
Pt requesting a call back about getting in home PT.

## 2021-08-17 NOTE — Chronic Care Management (AMB) (Signed)
°  Care Management   Note  08/17/2021 Name: SRI CLEGG MRN: [id number] DOB: 08-14-1945  Toni Hill is a 76 y.o. year old female who is a primary care patient of Maudie Mercury, MD and is actively engaged with the care management team. I reached out to [name] by phone today to assist with re-scheduling a follow up visit with the RN Case Manager  Follow up plan: Unsuccessful telephone outreach attempt made. A HIPAA compliant phone message was left for the patient providing contact information and requesting a return call.  The care management team will reach out to the patient again over the next 7 days.  If patient returns call to provider office, please advise to call [place] at [phone].  [place] Management  Direct Dial: [phone]

## 2021-08-17 NOTE — Telephone Encounter (Signed)
It is my understanding her Str can still get paid, but they will need to transfer the ppw to her Husb. Can we verify this so Pt will get relief from this worry. I can consult Toni Hill or Toni Hill?

## 2021-08-17 NOTE — Telephone Encounter (Signed)
Patient told FO staff that she no longer wants to go to [place] as this will interfere with her sister receiving money through [employer].

## 2021-08-19 NOTE — Telephone Encounter (Signed)
Great-Pt is grateful for Str's home & her care.

## 2021-08-19 NOTE — Telephone Encounter (Signed)
Str Jana Half is caring for a dying Husb in the home & is being paid through [employer]. Not sure if the income is due to Husb or Pt need? SW may need to explore this & assist Family. Pt is worried for Str's income being cut, but I know she can retain that financial piece even if Pt moves to [facility]. They need to transfer the income to be based on Caregiving for the Husb if not already. SW would know the details.

## 2021-08-19 NOTE — Addendum Note (Signed)
Addended by: Dorian Pod A on: 08/19/2021 01:34 PM   Modules accepted: Orders

## 2021-08-21 NOTE — Chronic Care Management (AMB) (Signed)
°  Care Management   Note  08/21/2021 Name: Toni Hill MRN: [id number] DOB: 01/26/1946  Toni Hill is a 76 y.o. year old female who is a primary care patient of Maudie Mercury, MD. I reached out to [name] by phone today in response to a referral sent by Ms. Karen Chafe Bukhari's primary care provider.   [name] was given information about care management services today including:  Care management services include personalized support from designated clinical staff supervised by her physician, including individualized plan of care and coordination with other care providers 24/7 contact phone numbers for assistance for urgent and routine care needs. The patient may stop care management services at any time by phone call to the office staff.  Patient agreed to services and verbal consent obtained.   Follow up plan: Telephone appointment with care management team member scheduled BIP:JRPZ 08/24/21 and SW on 09/01/21   [facility]  Direct Dial: [phone]

## 2021-08-22 ENCOUNTER — Other Ambulatory Visit: Payer: Self-pay | Admitting: Internal Medicine

## 2021-08-24 ENCOUNTER — Ambulatory Visit: Payer: Medicare Other

## 2021-08-24 ENCOUNTER — Telehealth: Payer: Self-pay

## 2021-08-24 NOTE — Chronic Care Management (AMB) (Signed)
Care Management    RN Visit Note  08/24/2021 Name: Toni Hill MRN: [id number] DOB: Apr 28, 1946  Subjective: Toni Hill is a 76 y.o. year old female who is a primary care patient of Dolan Amen, MD. The care management team was consulted for assistance with disease management and care coordination needs.    Engaged with patient by telephone for initial visit in response to provider referral for case management and/or care coordination services.   Consent to Services:   Toni Hill was given information about Care Management services today including:  Care Management services includes personalized support from designated clinical staff supervised by her physician, including individualized plan of care and coordination with other care providers 24/7 contact phone numbers for assistance for urgent and routine care needs. The patient may stop case management services at any time by phone call to the office staff.  Patient agreed to services and consent obtained.   Assessment: Review of patient past medical history, allergies, medications, health status, including review of consultants reports, laboratory and other test data, was performed as part of comprehensive evaluation and provision of chronic care management services.   SDOH (Social Determinants of Health) assessments and interventions performed:    Care Plan  Allergies  Allergen Reactions   Bactrim [Sulfamethoxazole-Trimethoprim]     Patient experienced nephrotoxicity in the setting of CKD. Strongly recommend against using this antibiotic in the future.    Cephalexin Itching   Gabapentin     Kidney issue   Morphine And Related Nausea And Vomiting   Vioxx [Rofecoxib]     GI bleeding   Penicillins Hives and Itching    Has patient had a PCN reaction causing immediate rash, facial/tongue/throat swelling, SOB or lightheadedness with hypotension: No Has patient had a PCN reaction causing severe rash involving mucus  membranes or skin necrosis: No Has patient had a PCN reaction that required hospitalization: no Has patient had a PCN reaction occurring within the last 10 years: No If all of the above answers are "NO", then may proceed with Cephalosporin use.     Outpatient Encounter Medications as of 08/24/2021  Medication Sig   acetaminophen (TYLENOL) 500 MG tablet Take 1,000 mg by mouth in the morning and at bedtime.   B-D ULTRAFINE III SHORT PEN 31G X 8 MM MISC 1 EACH BY DOES NOT APPLY ROUTE DAILY. DIAGNOSIS CODE: E11.9. USED TO INJECT INSULIN DAILY.   Blood Glucose Monitoring Suppl (ONETOUCH VERIO REFLECT) w/Device KIT Check blood sugar 3 times a day   Calcium Carb-Cholecalciferol (CALCIUM 600/VITAMIN D3) 600-800 MG-UNIT TABS Take 1 tablet by mouth daily.    ezetimibe (ZETIA) 10 MG tablet TAKE 1 TABLET BY MOUTH EVERY DAY IN THE EVENING   glucose blood (ONETOUCH VERIO) test strip Check blood sugar 3 times a day   insulin degludec (TRESIBA FLEXTOUCH) 100 UNIT/ML FlexTouch Pen Inject 12 Units into the skin daily.   levothyroxine (SYNTHROID) 137 MCG tablet Take 1 tablet (137 mcg total) by mouth daily before breakfast. (Patient taking differently: Take 137 mcg by mouth daily before breakfast. Brand name only)   lidocaine (LIDODERM) 5 % PLACE 1 PATCH ONTO THE SKIN DAILY. REMOVE & DISCARD PATCH WITHIN 12 HOURS OR AS DIRECTED BY MD   ondansetron (ZOFRAN) 4 MG tablet Take 1 tablet (4 mg total) by mouth 2 (two) times daily as needed for nausea or vomiting.   OneTouch Delica Lancets 33G MISC Check blood sugar 3 times a day   pantoprazole (PROTONIX) 40 MG  tablet TAKE 1 TABLET BY MOUTH EVERY DAY   sertraline (ZOLOFT) 100 MG tablet TAKE 2 TABLETS BY MOUTH EVERY DAY   simvastatin (ZOCOR) 40 MG tablet Take 1 tablet (40 mg total) by mouth daily.   VITAMIN E PO Take 4,000 Units by mouth 2 (two) times daily.   [DISCONTINUED] pregabalin (LYRICA) 150 MG capsule TAKE 1 CAPSULE BY MOUTH THREE TIMES A DAY   No  facility-administered encounter medications on file as of 08/24/2021.    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    Deep vein thrombosis (DVT) of proximal vein of right lower extremity ([place]) 05/26/2020   Protein-calorie malnutrition, severe 01/04/2020   Degenerative disc disease, lumbar 09/06/2019   Peripheral neuropathy 09/06/2019   Seborrheic keratoses 08/20/2019   Left breast mass 08/20/2019   Macrocytic anemia 03/08/2019   QT prolongation 01/25/2019   Bipolar 1 disorder ([place])    Osteoarthritis of left knee 12/30/2018   Anxiety 12/30/2018   Stage 3 chronic kidney disease 06/28/2016   Hyperlipidemia 08/20/2015   Type 2 diabetes mellitus ([place]) 02/28/2014   Esophageal reflux 02/28/2014   Stress incontinence, female 02/28/2014   Hypothyroidism (acquired) 02/28/2014   Essential hypertension 11/17/2011    Conditions to be addressed/monitored: HTN, DMII, and CKD Stage 3  Care Plan : RN Care Manager Plan of Care  Updates made by Toni Killian, RN since 08/24/2021 12:00 AM     Problem: Health Promotion or Disease Self-Management (General Plan of Care)      Goal: Chronic Disease Management and Care Coordination Needs (Mobility,HTN, DM2, CKD3)   Start Date: 08/24/2021  This Visit's Progress: Not on track  Priority: High  Note:   Current Barriers: Patient returned call this afternoon after missing call at 12:30.  We discussed patients health needs and she is concerned as she is becoming more immobile.  Patient is bed bound and she states she use to be able to roll from side to side and she can no longer do that.  Patients 58 year old Sister is her paid caregiver through the [facility] program and she has decided if she transitions to the [facility] program, her Sister will no longer be paid (This RNCM confirmed with [facility] representative, Shavon).  Patient is requesting home health  physical therapy and we discussed that they would only come 1-2 a week for a short time and she may need more intense therapy.  Message sent to Dr. Gilford Rile to see if he will order the physical therapy and patient also requested to speak with him. Knowledge Deficits related to plan of care for management of HTN, DMII, and CKD Stage 3  Lacks caregiver support Astronomer barriers  RNCM Clinical Goal(s):  Patient will verbalize basic understanding of  HTN, DMII, and CKD Stage 3 disease process and self health management plan as evidenced by conversation with provider. continue to work with RN Care Manager to address care management and care coordination needs related to  HTN, DMII, and CKD Stage 3 as evidenced by adherence to CM Team Scheduled appointments work with Education officer, museum to address  related to the management of Transportation, Limited access to food, Level of care concerns, ADL IADL limitations, and Inability to perform ADL's independently related to the management of HTN, DMII, and CKD Stage 3 as evidenced by review of EMR and patient or social worker report not experience hospital admission as evidenced  by review of EMR. Hospital Admissions in last 6 months = 0  through collaboration with RN Care manager, provider, and care team.  Interventions: 1:1 collaboration with primary care provider regarding development and update of comprehensive plan of care as evidenced by provider attestation and co-signature Inter-disciplinary care team collaboration (see longitudinal plan of care) Evaluation of current treatment plan related to  self management and patient's adherence to plan as established by provider  Chronic Kidney Disease Interventions:  (Status:  Goal on track:  NO.) Long Term Goal Evaluation of current treatment plan related to chronic kidney disease self management and patient's adherence to plan as established by provider      Reviewed medications with patient  and discussed importance of compliance    Advised patient, providing education and rationale, to monitor blood pressure daily and record, calling PCP for findings outside established parameters    Last practice recorded BP readings:  BP Readings from Last 3 Encounters:  07/16/21 (!) 118/54  06/05/21 (!) 108/52  02/03/21 97/63  Most recent eGFR/CrCl:  Lab Results  Component Value Date   EGFR 51 (L) 07/16/2021    No components found for: CRCL  Diabetes Interventions:  (Status:  Goal on track:  NO.) Long Term Goal Assessed patient's understanding of A1c goal: <7% Reviewed medications with patient and discussed importance of medication adherence Discussed plans with patient for ongoing care management follow up and provided patient with direct contact information for care management team Review of patient status, including review of consultants reports, relevant laboratory and other test results, and medications completed Lab Results  Component Value Date   HGBA1C 7.8 (A) 07/16/2021   Hypertension Interventions:  (Status:  Goal on track:  Yes.) Long Term Goal Last practice recorded BP readings:  BP Readings from Last 3 Encounters:  07/16/21 (!) 118/54  06/05/21 (!) 108/52  02/03/21 97/63  Most recent eGFR/CrCl:  Lab Results  Component Value Date   EGFR 51 (L) 07/16/2021    No components found for: CRCL  Evaluation of current treatment plan related to hypertension self management and patient's adherence to plan as established by provider Discussed plans with patient for ongoing care management follow up and provided patient with direct contact information for care management team Reviewed scheduled/upcoming provider appointments including:   Patient Goals/Self-Care Activities: Take all medications as prescribed Attend all scheduled provider appointments Call pharmacy for medication refills 3-7 days in advance of running out of medications Call provider office for new concerns or  questions  Work with the social worker to address care coordination needs and will continue to work with the clinical team to address health care and disease management related needs check feet daily for cuts, sores or redness enter blood sugar readings and medication or insulin into daily log drink 6 to 8 glasses of water each day choose a place to take my blood pressure (home, clinic or office, retail store) call doctor for signs and symptoms of high blood pressure keep all doctor appointments  Follow Up Plan:  The patient has been provided with contact information for the care management team and has been advised to call with any health related questions or concerns.        Plan: The care management team will reach out to the patient again after receiving response from Dr. Gilford Rile.  Toni Killian, RN, BSN, CCM Care Management Coordinator [facility] Internal Medicine Phone: [phone]: [phone]

## 2021-08-24 NOTE — Telephone Encounter (Signed)
°  Care Management   Outreach Note  08/24/2021 Name: Toni Hill MRN: [id number] DOB: 1945-09-09  Referred by: Maudie Mercury, MD Reason for referral : No chief complaint on file.   An unsuccessful telephone outreach was attempted today. The patient was referred to the case management team for assistance with care management and care coordination.   Follow Up Plan: If patient returns call to provider office, please advise to call [place] at [phone] to reschedule.  Johnney Killian, RN, BSN, CCM Care Management Coordinator [facility] Internal Medicine Phone: [phone]: [phone]

## 2021-08-24 NOTE — Patient Instructions (Signed)
° °  If you are experiencing a Mental Health or [place] or need someone to talk to, please call the [place] National Suicide Prevention Lifeline: (314)166-6578 or TTY: 918-867-3552 TTY 423-056-7016) to talk to a trained counselor   Patient verbalizes understanding of instructions and care plan provided today and agrees to view in [place]. Active MyChart status confirmed with patient.    The care management team will reach out to the patient again over the next couple of  days.  Pending response from MD.  Johnney Killian, RN, BSN, CCM Care Management Coordinator [facility] Internal Medicine Phone: [phone]: [phone]

## 2021-09-01 ENCOUNTER — Telehealth: Payer: Self-pay | Admitting: Licensed Clinical Social Worker

## 2021-09-01 ENCOUNTER — Telehealth: Payer: Medicare Other | Admitting: Licensed Clinical Social Worker

## 2021-09-01 NOTE — Telephone Encounter (Signed)
°  Care Management   Follow Up Note   09/01/2021 Name: Toni Hill MRN: [id number] DOB: Mar 27, 1946   Referred by: Maudie Mercury, MD Reason for referral : No chief complaint on file.   An unsuccessful telephone outreach was attempted today. The patient was referred to the case management team for assistance with care management and care coordination.  SW attempted from practice number on today as advised.  Follow Up Plan: The care management team will reach out to the patient again over the next 30 days.   SIGNATURE

## 2021-09-03 ENCOUNTER — Telehealth: Payer: Self-pay | Admitting: Behavioral Health

## 2021-09-03 ENCOUNTER — Ambulatory Visit: Payer: Medicare Other | Admitting: Behavioral Health

## 2021-09-03 DIAGNOSIS — F319 Bipolar disorder, unspecified: Secondary | ICD-10-CM

## 2021-09-03 NOTE — BH Specialist Note (Signed)
Integrated Behavioral Health via Telemedicine Visit ? ?09/03/2021 ?[name] ?[id number] ? ?Number of [place] Clinician visits: 13 ?Session Start time: 1:45pm ?Session End time: 2:00pm ?Total time in minutes:15 min  ? ?Referring Provider: Dr. Thomes Dinning, MD ?Patient/Family location: Pt is home in private ?[facility] Provider location: Working remotely ?All persons participating in visit: Pt & Clinician ?Types of Service: Individual psychotherapy ? ?I connected with [name] and/or Karen Chafe Mcpherson's  self  via  Telephone or Video Enabled Telemedicine Application  (Video is Caregility application) and verified that I am speaking with the correct person using two identifiers. Discussed confidentiality: Yes  ? ?I discussed the limitations of telemedicine and the availability of in person appointments.  Discussed there is a possibility of technology failure and discussed alternative modes of communication if that failure occurs. ? ?I discussed that engaging in this telemedicine visit, they consent to the provision of behavioral healthcare and the services will be billed under their insurance. ? ?Patient and/or legal guardian expressed understanding and consented to Telemedicine visit: Yes  ? ?Presenting Concerns: ?Patient and/or family reports the following symptoms/concerns: cell phone is on DND & cannot receive calls  ?Duration of problem: over a month; Severity of problem: moderate ? ?Patient and/or Family's Strengths/Protective Factors: ?Concrete supports in place (healthy food, safe environments, etc.) ? ?Goals Addressed: ?Patient will: ? Reduce symptoms of: anxiety, depression, and stress  ? Increase knowledge and/or ability of: coping skills and stress reduction  ? Demonstrate ability to: Increase healthy adjustment to current life circumstances ? ?Progress towards Goals: ?Ongoing ? ?Interventions: ?Interventions utilized:  Veterinary surgeon and Supportive Counseling ?Standardized  Assessments completed: Not Needed ? ?Patient and/or Family Response: Pt consistently receptive to [facility] telehealth from Clinician. Pt requests future appt. ? ?Assessment: ?Patient currently experiencing anx/dep due to persistent issues w/her leg & lack of ambulation. Pt unable to accept the situation & wants more done. Pt's difficulty w/transportation has made her Physician appts challenging. ? ?Patient may benefit from [place] services, but she does not want to take away income for her Str provided by [employer]. Explained to Pt, although she loves & appreciates all her 75yo Str does to care for her, the home situation is not optimum & she is not resp for providing income to her Str. Pt is responsible for her own best health situation & this may enhance her Str's well-being simultaneously as she also cares FT for her Husb in the home. ? ?Plan: ?Follow up with behavioral health clinician on : 2-3 wks on telehealth for 30 min ?Behavioral recommendations: None today ?Referral(s): [place] (In Clinic) ? ?I discussed the assessment and treatment plan with the patient and/or parent/guardian. They were provided an opportunity to ask questions and all were answered. They agreed with the plan and demonstrated an understanding of the instructions. ?  ?They were advised to call back or seek an in-person evaluation if the symptoms worsen or if the condition fails to improve as anticipated. ? ?Donnetta Hutching, LMFT ?

## 2021-09-03 NOTE — Telephone Encounter (Signed)
Attempted to reach Pt today for [facility] telehealth session. Lft msg for Pt I am trying to reach her. This has happened twice w/no outreach to Pt. Will contact Str to determine situation.  ? ?Dr. Theodis Shove ?

## 2021-09-12 ENCOUNTER — Other Ambulatory Visit: Payer: Self-pay | Admitting: Internal Medicine

## 2021-09-18 ENCOUNTER — Encounter
Payer: Medicare Other | Attending: Physical Medicine and Rehabilitation | Admitting: Physical Medicine and Rehabilitation

## 2021-09-18 ENCOUNTER — Other Ambulatory Visit: Payer: Self-pay

## 2021-09-18 ENCOUNTER — Ambulatory Visit: Payer: Medicare Other | Admitting: Physical Medicine and Rehabilitation

## 2021-09-18 ENCOUNTER — Encounter: Payer: Self-pay | Admitting: Physical Medicine and Rehabilitation

## 2021-09-18 VITALS — BP 155/82 | HR 74 | Ht 69.0 in

## 2021-09-18 DIAGNOSIS — M24561 Contracture, right knee: Secondary | ICD-10-CM | POA: Diagnosis not present

## 2021-09-18 DIAGNOSIS — G8313 Monoplegia of lower limb affecting right nondominant side: Secondary | ICD-10-CM | POA: Diagnosis not present

## 2021-09-18 DIAGNOSIS — M24571 Contracture, right ankle: Secondary | ICD-10-CM | POA: Insufficient documentation

## 2021-09-18 DIAGNOSIS — M24574 Contracture, right foot: Secondary | ICD-10-CM | POA: Diagnosis not present

## 2021-09-18 MED ORDER — LAMOTRIGINE 25 MG PO TABS: 25.0000 mg | ORAL_TABLET | Freq: Two times a day (BID) | ORAL | 5 refills | Status: DC

## 2021-09-18 NOTE — Progress Notes (Signed)
? ?Subjective:  ? ? Patient ID: Toni Hill, female    DOB: [birth year], 76 y.o.   MRN: [id number] ? ?HPI ?Pt is a 76 yr old female with hx of L TKR ~ 2 years ago; DM- poorly controlled- and RLE paralysis here for evaluation of RLE paralysis ? ?"Weakness of RLE"- is "paralyzed". Won't work ?Has been in bed in last 1 year-  ?Cannot stay in w/c she bought because she slides out of it.  ? ? ?Guess has given up- since laying there in bed "so long".  ? ?Feels like having spasms of RLE- feels like pulling toes off-  ? ?Initially woke up and couldn't move RLE-  ?Didn't have any control of it- beginning of last year per pt.  ?Would scream when someone tried to move her or try to bend leg.  ? ?Does note had a fall at [place]- not sure exactly when and was placed in a boot- but then some time after that, woke up and couldn't move RLE at all- feels so painful and heavy.  ? ?Pain is jabbing pain- takes tylenol and Lyrica 150 mg TID- never ? ?Social Hx: ? ?Lives with sister who's 40 who takes care of pt.  ?Pt left husband -who was taking care of her.  ? ?Pain Inventory ?Average Pain 9 ?Pain Right Now 10 ?My pain is constant and stabbing ? ?In the last 24 hours, has pain interfered with the following? ?General activity 10 ?Relation with others 10 ?Enjoyment of life 10 ?What TIME of day is your pain at its worst? morning  and night ?Sleep (in general) Poor ? ?Pain is worse with: some activites ?Pain improves with:  Nothing helps ?Relief from Meds:  No meds ? ?use a wheelchair ?needs help with transfers ? ?I need assistance with the following:  dressing, bathing, toileting, meal prep, household duties, and shopping ? ?bladder control problems ?bowel control problems ?weakness ?trouble walking ?spasms ?dizziness ?depression ?anxiety ? ?Any changes since last visit?  no ? ?Any changes since last visit?  no ? ? ? ?Family History  ?Problem Relation Age of Onset  ? Breast cancer Mother 49  ? Cancer Father   ?     ? type  ? Breast  cancer Sister 48  ? Breast cancer Maternal Grandmother   ?     ? age  ? ?Social History  ? ?Socioeconomic History  ? Marital status: Legally Separated  ?  Spouse name: Not on file  ? Number of children: 2  ? Years of education: Not on file  ? Highest education level: Not on file  ?Occupational History  ? Not on file  ?Tobacco Use  ? Smoking status: Never  ? Smokeless tobacco: Never  ?Vaping Use  ? Vaping Use: Never used  ?Substance and Sexual Activity  ? Alcohol use: No  ? Drug use: No  ? Sexual activity: Not Currently  ?Other Topics Concern  ? Not on file  ?Social History Narrative  ? Left Handed   ? Lives in a one story home.   ? Lives with sister  ? ?Social Determinants of Health  ? ?Financial Resource Strain: Not on file  ?Food Insecurity: Not on file  ?Transportation Needs: Not on file  ?Physical Activity: Not on file  ?Stress: Not on file  ?Social Connections: Not on file  ? ?Past Surgical History:  ?Procedure Laterality Date  ? CHOLECYSTECTOMY OPEN  1990's  ? COLONOSCOPY  last one 07-17-2014  ? CYSTOSCOPY WITH  INJECTION N/A 10/29/2016  ? Procedure: CYSTOSCOPY WITH INJECTION BOTOX 100 UNITS, urethral dilation;  Surgeon: Carolan Clines, MD;  Location: [place];  Service: Urology;  Laterality: N/A;  ? ESOPHAGOGASTRODUODENOSCOPY  last one 07-01-2016  ? EXCISIONAL BREAST BX  1990  ? BENIGN  ? HARDWARE REMOVAL Right 03/05/2020  ? Procedure: HARDWARE REMOVAL RIGHT ANKLE;  Surgeon: Leandrew Koyanagi, MD;  Location: [place];  Service: Orthopedics;  Laterality: Right;  ? ORIF TIBIA & FIBULA FRACTURES  2010  ? retained rod  ? TONSILLECTOMY  age 47  ? TOTAL KNEE ARTHROPLASTY Left 01/15/2019  ? Procedure: LEFT TOTAL KNEE ARTHROPLASTY;  Surgeon: Leandrew Koyanagi, MD;  Location: [facility];  Service: Orthopedics;  Laterality: Left;  ? VAGINAL HYSTERECTOMY  1979  ? VENTRAL HERNIA REPAIR  1990's  ? ?Past Medical History:  ?Diagnosis Date  ? At risk for adverse drug event 01/22/2019  ?   RIOSORD (Risk  Index for Overdose or Serious Opioid -induced Respiratory Depression Risk ) is a tool which determines that risk over the  ensuing 6 months . Based on point total for personal medical co-morbidities and present drug therapies ; the risk : benefit ratio of any drug regimen can be defined and informed consent documented. Reference: [facility] October 2017   ? Bilateral lower extremity edema   ? right > left  ? Bipolar 1 disorder ([place])   ? CKD (chronic kidney disease) stage 3, GFR 30-59 ml/min (HCC)   ?  Dr Lawson [facility], [facility]  ? Constipation 06/06/2020  ? Diabetes, polyneuropathy ([place])   ? FEET AND FINGER TIPS  ? Fibromyalgia   ? GERD (gastroesophageal reflux disease)   ? History of chronic gastritis   ? History of COVID-19   ? feb 2021  ? Hyperlipidemia   ? Hypertension   ? per pt was take off bp medication because of kidney disease told by her nephrologist  ? Hypothyroidism   ? Intractable nausea and vomiting 01/02/2020  ? Mixed incontinence urge and stress   ? OA (osteoarthritis)   ? KNEES  ? OAB (overactive bladder)   ? PONV (postoperative nausea and vomiting)   ? 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 Type 2 diabetes mellitus treated with insulin ([place])   ? Wears glasses   ? ?BP (!) 155/82   Pulse 74   Ht '5\' 9"'$  (1.753 m)   SpO2 94%   BMI 22.45 kg/m?  ? ?Opioid Risk Score:   ?Fall Risk Score:  `1 ? ?Depression screen PHQ 2/9 ? ?Depression screen [facility] 2/9 09/18/2021 07/16/2021 01/15/2021 12/16/2020 09/05/2020 06/05/2020 05/27/2020  ?Decreased Interest '3 1 3 3 '$ 0 3 1  ?Down, Depressed, Hopeless '3 1 3 3 '$ 0 3 1  ?PHQ - 2 Score '6 2 6 6 '$ 0 6 2  ?Altered sleeping 3 1 0 3 - 3 3  ?Tired, decreased energy '3 2 3 3 '$ - 3 3  ?Change in appetite 0 1 0 3 - 3 0  ?Feeling bad or failure about yourself  1 1 0 3 - 3 1  ?Trouble concentrating 0 '1 3 3 '$ - 3 0  ?Moving slowly or fidgety/restless 0 1 0 3 - 3 1  ?Suicidal thoughts 0 1 0 0 - 0 1  ?PHQ-9 Score '13 10 12 24 '$ - 24 11  ?Difficult doing  work/chores Very difficult Not difficult at all Somewhat difficult Not difficult at all -  Very difficult Not difficult at all  ?Some recent data might be hidden  ?  ? ?Review of Systems  ?Constitutional: Negative.   ?HENT: Negative.    ?Eyes: Negative.   ?Respiratory: Negative.    ?Cardiovascular: Negative.   ?Gastrointestinal: Negative.   ?Endocrine: Negative.   ?Genitourinary:  Positive for difficulty urinating and hematuria.  ?Musculoskeletal:  Positive for back pain and gait problem.  ?Skin: Negative.   ?Allergic/Immunologic: Negative.   ?Neurological:  Positive for dizziness and weakness.  ?Hematological: Negative.   ?Psychiatric/Behavioral:  Positive for dysphoric mood and sleep disturbance. The patient is nervous/anxious.   ? ?   ?Objective:  ? Physical Exam ? ?Awake, alert, on stretcher; depressed;  ?Lacking 20+ degrees of L knee extension; ? ?MS: UE 5/5 in biceps, triceps, WE, grip and FA B/L ?LLE- 5-/5 in HF; KE; KF ; DF and PF ?RLE ?0/5 ?Foot is in severe contracted PF- glassy/glossy- but skin peeling.  ?Cannot flex passively or actively R knee or R ankle.  ?Toes curled up and c/o severe sensitivity to touch. Very TTP- cannot let me barely touch RLE.  ?No Hoffman's or spasticity of UE B/L  ? ?Neuro: ?Sensation decreased from L1/L2 down to S1 on RLE only.  ? ? ?   ?Assessment & Plan:  ? ?Pt is a 76 yr old female with hx of L TKR ~ 2 years ago; DM- poorly controlled- and CKD stage IV; and RLE paralysis here for evaluation of RLE paralysis ? ?Leg is completely paralyzed and contracted - cannot find a cause- doesn't appear to be anatomical base don review of MRI of thoracic or lumbar spine and brain MRI- no reason to get cervical MRI.  ? ?2. Since leg is contracted, don't see how she can walk again- unable to bend knee or ankle and foot is in severe Plantar flexion.  ? ? ?3. Main issue right now is pain- ?Will try Lamictal 25 mg 2x/day x 1 week, then 50 mg 2x/day- for nerve pain- although originally mood  medicine, I've treated many patients for nerve pain with this medicine with good success.  ? ?4. Has Type I bipolar disorder- not on ay meds for this. Of note.  ? ? ?5. Called Dr Mellody Drown and went over MRIs personally to look

## 2021-09-18 NOTE — Patient Instructions (Signed)
Pt is a 76 yr old female with hx of L TKR ~ 2 years ago; DM- poorly controlled- and CKD stage IV; and RLE paralysis here for evaluation of RLE paralysis ? ?Leg is completely paralyzed and contracted - cannot find a cause- doesn't appear to be anatomical base don review of MRI of thoracic or lumbar spine and brain MRI- no reason to get cervical MRI.  ? ?2. Since leg is contracted, don't see how she can walk again- unable to bend knee or ankle and foot is in severe Plantar flexion.  ? ? ?3. Main issue right now is pain- ?Will try Lamictal 25 mg 2x/day x 1 week, then 50 mg 2x/day- for nerve pain- although originally mood medicine, I've treated many patients for nerve pain with this medicine with good success.  ? ?4. Has Type I bipolar disorder- not on ay meds for this. Of note.  ? ? ?5. Called Dr Mellody Drown and went over MRIs personally to look for possible [place] stroke- cannot find that or fistula on review of actual films. Spent 15 minutes on phone.  ? ?6. F/U in 2-3 months- but call in 3-4 weeks to let know hwo things going- call 10/19/21 ?

## 2021-09-23 ENCOUNTER — Telehealth: Payer: Self-pay | Admitting: Orthopaedic Surgery

## 2021-09-23 NOTE — Telephone Encounter (Signed)
Please advise 

## 2021-09-23 NOTE — Telephone Encounter (Signed)
Pt called and is in a lot of pain. She states Xu replaced her L knee 3 years ago. She is saying that " she cannot bear weight or even move her left leg. Also is saying her right ankle is swollen. She wants someone to call her.  ? ? ?CB [phone]  ?

## 2021-09-23 NOTE — Telephone Encounter (Signed)
Error

## 2021-09-24 NOTE — Telephone Encounter (Signed)
Sounds like she may need to be seen.  I can also call in medicine.  Tramadol or tylenol, just let me know which

## 2021-09-24 NOTE — Telephone Encounter (Signed)
Called patient. Offered her appt this PM. Unable to come in. Also she would like to Cx appt for 3/28-unable to come. Will try to find ride and make an appt. ? ?

## 2021-09-28 ENCOUNTER — Ambulatory Visit: Payer: Medicare Other | Admitting: Behavioral Health

## 2021-09-28 DIAGNOSIS — F331 Major depressive disorder, recurrent, moderate: Secondary | ICD-10-CM

## 2021-09-28 DIAGNOSIS — F419 Anxiety disorder, unspecified: Secondary | ICD-10-CM

## 2021-09-28 NOTE — BH Specialist Note (Signed)
Integrated Behavioral Health via Telemedicine Visit ? ?09/28/2021 ?Toni Hill ?[id number] ? ?Number of [facility] Clinician visits: 14 ?Session Start time: 1400  ?Session End time: 1430 ?Total time in minutes: 30 min ? ?Referring Provider: Dr. Thomes Dinning, MD ?Patient/Family location: Pt is staying w/her Toni Hill for personal care ?[name]: [facility] Office ?All persons participating in visit: Pt & Clinician ?Types of Service: Individual psychotherapy ? ?I connected with Toni Hill and/or Toni Hill's  self  via  Telephone or Video Enabled Telemedicine Application  (Video is Caregility application) and verified that I am speaking with the correct person using two identifiers. Discussed confidentiality: Yes  ? ?I discussed the limitations of telemedicine and the availability of in person appointments.  Discussed there is a possibility of technology failure and discussed alternative modes of communication if that failure occurs. ? ?I discussed that engaging in this telemedicine visit, they consent to the provision of behavioral healthcare and the services will be billed under their insurance. ? ?Patient and/or legal guardian expressed understanding and consented to Telemedicine visit: Yes  ? ?Presenting Concerns: ?Patient and/or family reports the following symptoms/concerns: consistent levels of anx/dep; Pt has been put on Lamictal '50mg'$  BID for nerve pain & mood regulation. Her Provider @ [facility] has notified Pt she may likely never walk again. Pt is trying to adjust to this news.  ? ?Duration of problem: over one year; Severity of problem: moderate ? ?Patient and/or Family's Strengths/Protective Factors: ?Social connections and [place] supports in place (healthy food, safe environments, etc.) ? ?Goals Addressed: ?Patient will: ? Reduce symptoms of: anxiety, depression, and mood instability  ? Increase knowledge and/or ability of: coping skills and healthy habits  ?  Demonstrate ability to: Increase healthy adjustment to current life circumstances and Increase adequate support systems for patient/family ? ?Progress towards Goals: ?Ongoing ? ?Interventions: ?Interventions utilized:  Supportive Counseling ?Standardized Assessments completed:  screeners prn ? ?Patient and/or Family Response: Pt receptive to call today & requests future appt for mental health wellness ? ?Assessment: ?Patient currently experiencing inc in anx/dep since trying to accept the news she may not walk again. Her Provider Dr. Dagoberto Ligas, MD @ [place] told her the news about her disability being lasting. Pt is trying to adjust to this news although she has tried to face this for months. ? ?Discussed the idea of transitioning to PACE. Pt does not want to leave her Str Jana Half who cares FT for her dying Husb. He is a high fall risk & many Physician appts.  ? ?Patient may benefit from cont'd telehealth sessions for mental health wellness. ? ?Plan: ?Follow up with behavioral health clinician on : 2-3 wks on telehealth for 30 min ?Behavioral recommendations: Read some Love Stories & People mags & other Newspapers. ?Referral(s): [place] (In Clinic) ? ?I discussed the assessment and treatment plan with the patient and/or parent/guardian. They were provided an opportunity to ask questions and all were answered. They agreed with the plan and demonstrated an understanding of the instructions. ?  ?They were advised to call back or seek an in-person evaluation if the symptoms worsen or if the condition fails to improve as anticipated. ? ?Toni Hutching, LMFT ?

## 2021-09-29 ENCOUNTER — Ambulatory Visit: Payer: Medicare Other | Admitting: Orthopaedic Surgery

## 2021-10-01 ENCOUNTER — Telehealth: Payer: Self-pay | Admitting: *Deleted

## 2021-10-01 DIAGNOSIS — G8311 Monoplegia of lower limb affecting right dominant side: Secondary | ICD-10-CM

## 2021-10-01 NOTE — Telephone Encounter (Signed)
Mrs Fulcher called to ask if [facility] PT can be arranged because of the hardship of her leaving home (has to go by ambulance) for outpt therapy. ?

## 2021-10-02 ENCOUNTER — Other Ambulatory Visit: Payer: Self-pay | Admitting: Student

## 2021-10-02 ENCOUNTER — Telehealth: Payer: Self-pay

## 2021-10-02 NOTE — Telephone Encounter (Signed)
Message to be sent to request PT and Nursing services for patient as well.   ?

## 2021-10-02 NOTE — Telephone Encounter (Signed)
I have placed a H/H order for pt- ML

## 2021-10-02 NOTE — Telephone Encounter (Signed)
Pt is requesting a call  back .. she is wanting to have a order for PT in her home  ?

## 2021-10-02 NOTE — Telephone Encounter (Signed)
It appears Dr. Dagoberto Ligas has already placed these. ?

## 2021-10-06 ENCOUNTER — Ambulatory Visit: Payer: Medicare Other | Admitting: Orthopaedic Surgery

## 2021-10-07 ENCOUNTER — Telehealth: Payer: Self-pay | Admitting: *Deleted

## 2021-10-07 NOTE — Telephone Encounter (Signed)
[facility] called and said they have a delay in [facility] for the PT at pts request to begin Monday 4/10. ?

## 2021-10-13 ENCOUNTER — Telehealth: Payer: Self-pay

## 2021-10-13 NOTE — Telephone Encounter (Signed)
Toni Hill, PT form [facility] called for verbal orders for HHPT 2wk3. Orders approved and given. ?

## 2021-10-14 ENCOUNTER — Telehealth: Payer: Self-pay | Admitting: *Deleted

## 2021-10-14 NOTE — Telephone Encounter (Signed)
Shree PT [facility] called to report 8/10 pain in left knee. She is also having muscle spasms in her right knee and is asking what can be done for it. Please advise. ?

## 2021-10-16 NOTE — Telephone Encounter (Signed)
Return Ms. Unk Lightning call,no answer. Mailbox is full. Will await a return call.  ?

## 2021-10-19 DIAGNOSIS — K5900 Constipation, unspecified: Secondary | ICD-10-CM

## 2021-10-19 DIAGNOSIS — M797 Fibromyalgia: Secondary | ICD-10-CM

## 2021-10-19 DIAGNOSIS — E039 Hypothyroidism, unspecified: Secondary | ICD-10-CM

## 2021-10-19 DIAGNOSIS — E785 Hyperlipidemia, unspecified: Secondary | ICD-10-CM

## 2021-10-19 DIAGNOSIS — M24561 Contracture, right knee: Secondary | ICD-10-CM

## 2021-10-19 DIAGNOSIS — F319 Bipolar disorder, unspecified: Secondary | ICD-10-CM

## 2021-10-19 DIAGNOSIS — E1122 Type 2 diabetes mellitus with diabetic chronic kidney disease: Secondary | ICD-10-CM | POA: Diagnosis not present

## 2021-10-19 DIAGNOSIS — G8313 Monoplegia of lower limb affecting right nondominant side: Secondary | ICD-10-CM | POA: Diagnosis not present

## 2021-10-19 DIAGNOSIS — Z794 Long term (current) use of insulin: Secondary | ICD-10-CM

## 2021-10-19 DIAGNOSIS — N184 Chronic kidney disease, stage 4 (severe): Secondary | ICD-10-CM

## 2021-10-19 DIAGNOSIS — M24574 Contracture, right foot: Secondary | ICD-10-CM

## 2021-10-19 DIAGNOSIS — K219 Gastro-esophageal reflux disease without esophagitis: Secondary | ICD-10-CM

## 2021-10-19 DIAGNOSIS — I129 Hypertensive chronic kidney disease with stage 1 through stage 4 chronic kidney disease, or unspecified chronic kidney disease: Secondary | ICD-10-CM | POA: Diagnosis not present

## 2021-10-19 DIAGNOSIS — E1142 Type 2 diabetes mellitus with diabetic polyneuropathy: Secondary | ICD-10-CM | POA: Diagnosis not present

## 2021-10-19 DIAGNOSIS — M24571 Contracture, right ankle: Secondary | ICD-10-CM

## 2021-10-20 ENCOUNTER — Ambulatory Visit: Payer: Medicare Other | Admitting: Orthopaedic Surgery

## 2021-10-22 ENCOUNTER — Ambulatory Visit (INDEPENDENT_AMBULATORY_CARE_PROVIDER_SITE_OTHER): Payer: Medicare Other

## 2021-10-22 ENCOUNTER — Encounter: Payer: Self-pay | Admitting: Orthopaedic Surgery

## 2021-10-22 ENCOUNTER — Ambulatory Visit (INDEPENDENT_AMBULATORY_CARE_PROVIDER_SITE_OTHER): Payer: Medicare Other | Admitting: Orthopaedic Surgery

## 2021-10-22 DIAGNOSIS — M24562 Contracture, left knee: Secondary | ICD-10-CM | POA: Diagnosis not present

## 2021-10-22 DIAGNOSIS — G8929 Other chronic pain: Secondary | ICD-10-CM

## 2021-10-22 DIAGNOSIS — M25562 Pain in left knee: Secondary | ICD-10-CM

## 2021-10-22 DIAGNOSIS — M24571 Contracture, right ankle: Secondary | ICD-10-CM

## 2021-10-22 NOTE — Progress Notes (Signed)
? ?Office Visit Note ?  ?Patient: Toni Hill           ?Date of Birth: 04-24-1946           ?MRN: [id number] ?Visit Date: 10/22/2021 ?             ?Requested by: Maudie Mercury, MD ?[address] ?[id number] ?[place],  [place] [address] ?PCP: Maudie Mercury, MD ? ? ?Assessment & Plan: ?Visit Diagnoses:  ?1. Flexion contracture of left knee   ?2. Equinus contracture of right ankle   ? ? ?Plan: Toni Hill is in a very unfortunate situation.  I explained that I cannot identify an orthopedic cause for her inability to walk.  Her lower extremity issues are the result of her being nonambulatory for the last couple years.  Do not feel that surgical intervention would be of much benefit since she is nonambulatory.  I think it is worth looking into Botox injections as a first-line treatment for the contractures.  She can follow-up with me as needed. ? ?Follow-Up Instructions: No follow-ups on file.  ? ?Orders:  ?Orders Placed This Encounter  ?Procedures  ? XR Knee 1-2 Views Left  ? ?No orders of the defined types were placed in this encounter. ? ? ? ? Procedures: ?No procedures performed ? ? ?Clinical Data: ?No additional findings. ? ? ?Subjective: ?Chief Complaint  ?Patient presents with  ? Right Foot - Pain  ? Left Knee - Pain  ? ? ?HPI ? ?Toni Hill is a 76 year old female who I have taken care of previously but I have not seen her in a couple years.  She comes in today for evaluation for inability to extend her left knee and contractures of the right foot and ankle.  She unfortunately developed inability to walk about 2 years ago.  So far her medical doctors and specialists have not been able to identify a cause.  She is in a Insurance underwriter today. ? ?Review of Systems  ?Constitutional: Negative.   ?HENT: Negative.    ?Eyes: Negative.   ?Respiratory: Negative.    ?Cardiovascular: Negative.   ?Endocrine: Negative.   ?Musculoskeletal: Negative.   ?Neurological: Negative.   ?Hematological: Negative.   ?Psychiatric/Behavioral: Negative.     ?All other systems reviewed and are negative. ? ? ?Objective: ?Vital Signs: There were no vitals taken for this visit. ? ?Physical Exam ?Vitals and nursing note reviewed.  ?Constitutional:   ?   Appearance: She is well-developed.  ?Pulmonary:  ?   Effort: Pulmonary effort is normal.  ?Skin: ?   General: Skin is warm.  ?   Capillary Refill: Capillary refill takes less than 2 seconds.  ?Neurological:  ?   Mental Status: She is alert and oriented to person, place, and time.  ?Psychiatric:     ?   Behavior: Behavior normal.     ?   Thought Content: Thought content normal.     ?   Judgment: Judgment normal.  ? ? ?Ortho Exam ? ?Examination of the right foot and ankle shows classic equinus contracture and flexion contractures of her toes.  No neurovascular compromise.  She has pain with attempted passive ankle dorsiflexion. ? ?Examination of the left knee shows a healed surgical scar.  She has excellent flexion but she lacks about 30 degrees of extension.  Stable to varus valgus. ? ?Specialty Comments:  ?No specialty comments available. ? ?Imaging: ?XR Knee 1-2 Views Left ? ?Result Date: 10/22/2021 ?Stable total knee replacement in good alignment.   ? ? ?  PMFS History: ?Patient Active Problem List  ? Diagnosis Date Noted  ? Flexion contracture of left knee 10/22/2021  ? 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 Knee contracture, right 09/18/2021  ? Equinus contracture of right ankle 09/18/2021  ? Healthcare maintenance 07/14/2021  ? 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 Deep vein thrombosis (DVT) of proximal vein of right lower extremity ([place]) 05/26/2020  ? Protein-calorie malnutrition, severe 01/04/2020  ? Degenerative disc disease, lumbar 09/06/2019  ? Peripheral neuropathy 09/06/2019  ? Seborrheic keratoses 08/20/2019  ? Left breast mass 08/20/2019  ? Macrocytic anemia 03/08/2019  ? QT prolongation 01/25/2019  ? Bipolar 1 disorder ([place])   ?  Osteoarthritis of left knee 12/30/2018  ? Anxiety 12/30/2018  ? Stage 3 chronic kidney disease 06/28/2016  ? Hyperlipidemia 08/20/2015  ? Type 2 diabetes mellitus ([place]) 02/28/2014  ? Esophageal reflux 02/28/2014  ? Stress incontinence, female 02/28/2014  ? Hypothyroidism (acquired) 02/28/2014  ? Essential hypertension 11/17/2011  ? ?Past Medical History:  ?Diagnosis Date  ? At risk for adverse drug event 01/22/2019  ?   RIOSORD (Risk Index for Overdose or Serious Opioid -induced Respiratory Depression Risk ) is a tool which determines that risk over the  ensuing 6 months . Based on point total for personal medical co-morbidities and present drug therapies ; the risk : benefit ratio of any drug regimen can be defined and informed consent documented. Reference: [facility] Hospitalist October 2017   ? Bilateral lower extremity edema   ? right > left  ? Bipolar 1 disorder ([place])   ? CKD (chronic kidney disease) stage 3, GFR 30-59 ml/min (HCC)   ?  Dr Lawson Radar, [facility]  ? Constipation 06/06/2020  ? Diabetes, polyneuropathy ([place])   ? FEET AND FINGER TIPS  ? Fibromyalgia   ? GERD (gastroesophageal reflux disease)   ? History of chronic gastritis   ? History of COVID-19   ? feb 2021  ? Hyperlipidemia   ? Hypertension   ? per pt was take off bp medication because of kidney disease told by her nephrologist  ? Hypothyroidism   ? Intractable nausea and vomiting 01/02/2020  ? Mixed incontinence urge and stress   ? OA (osteoarthritis)   ? KNEES  ? OAB (overactive bladder)   ? PONV (postoperative nausea and vomiting)   ? 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 Type 2 diabetes mellitus treated with insulin ([place])   ? Wears glasses   ?  ?Family History  ?Problem Relation Age of Onset  ? Breast cancer Mother 2  ? Cancer Father   ?     ? type  ? Breast cancer Sister 85  ? Breast cancer Maternal Grandmother   ?     ? age  ?  ?Past Surgical History:  ?Procedure Laterality Date  ? CHOLECYSTECTOMY OPEN   1990's  ? COLONOSCOPY  last one 07-17-2014  ? CYSTOSCOPY WITH INJECTION N/A 10/29/2016  ? Procedure: CYSTOSCOPY WITH INJECTION BOTOX 100 UNITS, urethral dilation;  Surgeon: Carolan Clines, MD;  Location: [facility];  Service: Urology;  Laterality: N/A;  ? ESOPHAGOGASTRODUODENOSCOPY  last one 07-01-2016  ? EXCISIONAL BREAST BX  1990  ? BENIGN  ? HARDWARE REMOVAL Right 03/05/2020  ? Procedure: HARDWARE REMOVAL RIGHT ANKLE;  Surgeon: Leandrew Koyanagi, MD;  Location: [facility];  Service: Orthopedics;  Laterality: Right;  ?  ORIF TIBIA & FIBULA FRACTURES  2010  ? retained rod  ? TONSILLECTOMY  age 51  ? TOTAL KNEE ARTHROPLASTY Left 01/15/2019  ? Procedure: LEFT TOTAL KNEE ARTHROPLASTY;  Surgeon: Leandrew Koyanagi, MD;  Location: [place] [facility];  Service: Orthopedics;  Laterality: Left;  ? VAGINAL HYSTERECTOMY  1979  ? VENTRAL HERNIA REPAIR  1990's  ? ?Social History  ? ?Occupational History  ? Not on file  ?Tobacco Use  ? Smoking status: Never  ? Smokeless tobacco: Never  ?Vaping Use  ? Vaping Use: Never used  ?Substance and Sexual Activity  ? Alcohol use: No  ? Drug use: No  ? Sexual activity: Not Currently  ? ? ? ? ? ? ?

## 2021-10-23 ENCOUNTER — Encounter: Payer: Self-pay | Admitting: Orthopaedic Surgery

## 2021-10-27 ENCOUNTER — Telehealth: Payer: Self-pay | Admitting: Behavioral Health

## 2021-10-27 ENCOUNTER — Telehealth: Payer: Self-pay | Admitting: *Deleted

## 2021-10-27 ENCOUNTER — Ambulatory Visit: Payer: Medicare Other | Admitting: Behavioral Health

## 2021-10-27 NOTE — Telephone Encounter (Signed)
[facility] PT called for PT POC extension 2wk3.  (Approval given) She is also reporting the  patient is complaining of pain scale 8/10 in right shoulder and leg and left knee. It looks like she has seen or contacted Dr Erlinda Hong for the knee pain. Her follow up appt with you is not until 01/08/22. Please advise. ?

## 2021-10-27 NOTE — Telephone Encounter (Signed)
Lft Pt 3 msgs to alert to appt @ 1:00pm. Also contacted Pt's Str who was @ a Dr.appt. Let Str know I would r/s Pt or she can call [facility] & schedule @ her convenience.  ? ?Dr. Theodis Shove ?

## 2021-10-29 ENCOUNTER — Telehealth: Payer: Self-pay

## 2021-10-29 NOTE — Telephone Encounter (Signed)
Toni Hill from [facility] called to request an order for the patient for a trapeze bar for her hospital bed. The patient is stating she has 8/10 pain in her shoulder. Please advise ?

## 2021-10-30 NOTE — Telephone Encounter (Signed)
She will need to See Dr Erlinda Hong for shoulder/knee pain - thanks- ML

## 2021-10-30 NOTE — Telephone Encounter (Signed)
The trapeeze will usually make shoulder pain worse- we need a specific justifcation for a trapeeze bar- can you find out the diagnosis that they want this for?- ML

## 2021-11-01 ENCOUNTER — Other Ambulatory Visit: Payer: Self-pay | Admitting: Internal Medicine

## 2021-11-01 DIAGNOSIS — F444 Conversion disorder with motor symptom or deficit: Secondary | ICD-10-CM

## 2021-11-02 NOTE — Telephone Encounter (Signed)
Do you know of a neurology office on [address]?

## 2021-11-03 NOTE — Telephone Encounter (Signed)
Spoke with Toni Hill and she stated it is hard for her to get up and reposition herself. She stated the bar would help her sit up in the bed without using her body weight. She stated right now she is pushing up with her arms and elbows and that is what's causing the shoulder pain. She also stated she is working to get more independent and not wanting help and that may be causing a lot of her shoulder pain. Please advise ?

## 2021-11-04 NOTE — Telephone Encounter (Signed)
Wrote rx and can fax to [facility], or whichever H/H location- [place]

## 2021-11-05 ENCOUNTER — Telehealth: Payer: Self-pay | Admitting: *Deleted

## 2021-11-05 NOTE — Telephone Encounter (Signed)
Sheree PT called to report that Toni Hill is still having pain in her shoulder 10/10.  When using the patch it drops to 6/10. I am not sure of what patch she is speaking of unless it is the lidocaine patch. Your last message mentioned she should speak to Dr Erlinda Hong about the shoulder but he too is seeing her for her RLE issues. (See last note from [facility]). Sheree is asking for OT eval to see if they can do anything for her. I tried to advise her that you are out of the office until Monday and will notify if the OT is ok to see, but her VM was full.Marland Kitchen  ?

## 2021-11-06 ENCOUNTER — Telehealth: Payer: Self-pay | Admitting: *Deleted

## 2021-11-06 ENCOUNTER — Ambulatory Visit (INDEPENDENT_AMBULATORY_CARE_PROVIDER_SITE_OTHER): Payer: Medicare Other | Admitting: Internal Medicine

## 2021-11-06 DIAGNOSIS — M25562 Pain in left knee: Secondary | ICD-10-CM

## 2021-11-06 NOTE — Telephone Encounter (Signed)
Telehealth appt schedule this afternoon with Dr Darrick Meigs. ?

## 2021-11-06 NOTE — Telephone Encounter (Signed)
Call from pt's sister,[name], who stated pt is in a lot of pain. And her left side is starting to become like her right side. Pt is unable to use the bedpan; she has to wear pull-ups. She asking if Dr Gilford Rile can call her? ? ?

## 2021-11-06 NOTE — Progress Notes (Signed)
[facility] telehealth visit note ? ?Consent: The patient has consented to being evaluated through a telephone encounter and understands the associated risks (an examination cannot be done and the patient may need to come in for an appointment) / benefits (allows the patient to remain at home, decreasing exposure to coronavirus).  She accepts the associated risks and wishes to proceed. ? ?HPI ?Toni Hill is a 76 year old chronically ill bedbound female with functional right lower extremity paralysis and history of total left knee replacement.  She requested a telehealth visit today for a 2-day history of left knee pain. ? ?She states the pain is located over the kneecap.  Pain is constant but exacerbated with movement of the left leg.  The pain is isolated to the knee and does not extend into the lower leg or hip.  Denies fever, chills.  Denies leg/knee swelling or erythema.  Denies preceding traumatic event.  She is bedbound and does not ambulate at baseline.  She has been working with physical therapy. ? ?Assessment/plan ?Pain is most likely attributable to exacerbation by physical therapy superimposed on her chronic pain syndrome. ?Alternative diagnoses including but not limited to septic arthritis and VTE considered less likely based on her HPI although chronically bedbound state does raise the risk for VTE.  I explained to her that our clinic is currently closed for in-office appointments until Monday and that these other conditions cannot be ruled out.  She was encouraged to seek out more urgent evaluation should her symptoms worsen over the weekend.  If not, we will try to get her into the office next week to see her PCP, Dr. Gilford Rile.  She was also encouraged to follow-up with Dr. Alice Rieger who is managing her pain in addition to rehabilitation. ? ?Pt discussed with Dr. Evette Doffing. ? ?Mitzi Hansen, MD ?No Charge ? ?

## 2021-11-09 NOTE — Progress Notes (Signed)
Internal Medicine Clinic Attending  Case discussed with Dr. Christian  At the time of the visit.  We reviewed the resident's history and exam and pertinent patient test results.  I agree with the assessment, diagnosis, and plan of care documented in the resident's note.  

## 2021-11-11 ENCOUNTER — Telehealth: Payer: Self-pay | Admitting: *Deleted

## 2021-11-11 DIAGNOSIS — M25511 Pain in right shoulder: Secondary | ICD-10-CM

## 2021-11-11 NOTE — Telephone Encounter (Signed)
Toni Hill PT called again about the patient complaining of pain in shoulder 9/10. Patient's sister called the PCP (see last internal med note) and was referred to you because you are managing her pain. Does not have return appt until July. ?

## 2021-11-13 MED ORDER — LAMOTRIGINE 100 MG PO TABS: 100.0000 mg | ORAL_TABLET | Freq: Two times a day (BID) | ORAL | 5 refills | Status: DC

## 2021-11-13 NOTE — Telephone Encounter (Signed)
Lamictal fell on floor and was helping to treat pain in RLE- sleeping better and doing better when taking it. But not sure if took lately since thinks fell under the bed; the bottle.  ? ? ?Now having new onset R shoulder pain.   ?She was never seen/evaluated for R shoulder pain when I saw pt.  ?Actually was sent to me from Dr Gilford Rile for RLE contracture- but was not under the impression pt was sent for "pain control"- was more for function, was my impression.  ? ?I feel uncomfortable prescribing opiates/controlled meds without knowing what's going on; since this is new- I think she needs to be seen, but I am booked out til August (pt has appt with me in July)-  ? ?I am happy to try and increase Lamictal dosing and can order an Xray of R shoulder- however not sure how to get an appointment for xray so pt can be transported via EMS, since she comes by stretcher-  ? ?I have ordered an xray- also have ordered Lamictal to increase to 100 mg BID- and sent into pharmacy- ? ?Spoke to pt and she felt Lamictal was helpful for RLE pain and isn't interested in opiates at this time. ? ?Also happy with getting therapy, which she's getting right now.  ? ?Describes pain in shoulder as bad at night- like something "stuck" under R shoulder blade-  when laying in bed- which is where she is 100% of time.  ? ?Have left message with IM residency to try and speak with Dr Gilford Rile- Have not heard back yet.  ? ? ?

## 2021-11-19 ENCOUNTER — Encounter: Payer: Medicare Other | Admitting: Internal Medicine

## 2021-11-19 ENCOUNTER — Telehealth: Payer: Self-pay | Admitting: *Deleted

## 2021-11-19 NOTE — Telephone Encounter (Signed)
Requesting SN visit for medication management  and teaching. Approval given.

## 2021-11-20 ENCOUNTER — Encounter: Payer: Self-pay | Admitting: Internal Medicine

## 2021-11-23 ENCOUNTER — Ambulatory Visit: Payer: Medicare Other | Admitting: Behavioral Health

## 2021-11-23 DIAGNOSIS — F419 Anxiety disorder, unspecified: Secondary | ICD-10-CM

## 2021-11-23 DIAGNOSIS — F331 Major depressive disorder, recurrent, moderate: Secondary | ICD-10-CM

## 2021-11-23 NOTE — BH Specialist Note (Signed)
Integrated Behavioral Health via Telemedicine Visit  11/23/2021 Toni Hill [id number]  Number of [place] Clinician visits: 15 Session Start time: 1100 Session End time: 1130 Total time in minutes: 30 min  Referring Provider: Dr. Thomes Dinning, MD Patient/Family location: Pt is home in bed [facility] Provider location: [facility] Office All persons participating in visit: Pt & Clinician Types of Service: Individual psychotherapy  I connected with Toni Hill and/or Toni Hill's  self  via  Telephone or Video Enabled Telemedicine Application  (Video is Caregility application) and verified that I am speaking with the correct person using two identifiers. Discussed confidentiality: Yes   I discussed the limitations of telemedicine and the availability of in person appointments.  Discussed there is a possibility of technology failure and discussed alternative modes of communication if that failure occurs.  I discussed that engaging in this telemedicine visit, they consent to the provision of behavioral healthcare and the services will be billed under their insurance.  Patient and/or legal guardian expressed understanding and consented to Telemedicine visit: Yes   Presenting Concerns: Patient and/or family reports the following symptoms/concerns: reduction in her anx/dep due to positive discussion w/her Conservator, museum/gallery Toni Hill. Str has related Pt is welcome in her home. Duration of problem: over one year; Severity of problem: moderate  Patient and/or Family's Strengths/Protective Factors: Concrete supports in place (healthy food, safe environments, etc.)  Goals Addressed: Patient will:  Reduce symptoms of: anxiety, depression, and mood instability   Increase knowledge and/or ability of: coping skills and stress reduction   Demonstrate ability to: Increase healthy adjustment to current life circumstances  Progress towards Goals: Ongoing  Interventions: Interventions  utilized:  Supportive Counseling Standardized Assessments completed:  screeners prn  Patient and/or Family Response: Pt is receptive to call today & getting ready for transportation to appt this morning.  Assessment: Patient currently experiencing adjustment to changing health status issues as she navigates home care w/[name].   Offered the idea of the [place] declined.  Patient may benefit from cont'd ck-in appt sessions.  Plan: Follow up with behavioral health clinician on : Next avail for 30 min ck-in call Behavioral recommendations: Let the PT assist you w/hints for adapting to being in the bed. Cont your discussions w/[name] to keep communication clear.  Referral(s): [place] (In Clinic)  I discussed the assessment and treatment plan with the patient and/or parent/guardian. They were provided an opportunity to ask questions and all were answered. They agreed with the plan and demonstrated an understanding of the instructions.   They were advised to call back or seek an in-person evaluation if the symptoms worsen or if the condition fails to improve as anticipated.  Donnetta Hutching, LMFT

## 2021-11-25 ENCOUNTER — Telehealth: Payer: Self-pay | Admitting: Orthopaedic Surgery

## 2021-11-25 NOTE — Telephone Encounter (Signed)
error 

## 2021-11-26 ENCOUNTER — Telehealth: Payer: Self-pay

## 2021-11-26 ENCOUNTER — Telehealth: Payer: Medicare Other

## 2021-11-26 NOTE — Telephone Encounter (Signed)
  Care Management   Outreach Note  11/26/2021 Name: Toni Hill MRN: [id number] DOB: 1945/12/11  Referred by: Maudie Mercury, MD Reason for referral : Chronic Care Management  An unsuccessful telephone outreach was attempted today. The patient was referred to the case management team for assistance with care management and care coordination.   Follow Up Plan: If patient returns call to provider office, please advise to call [place] at [phone].  Johnney Killian, RN, BSN, CCM Care Management Coordinator [facility] Internal Medicine Phone: [phone]: [phone]

## 2021-11-27 ENCOUNTER — Telehealth: Payer: Self-pay | Admitting: *Deleted

## 2021-11-27 NOTE — Telephone Encounter (Signed)
Patient called requesting a call regarding getting a brace for her leg. Please give her a call.

## 2021-12-02 ENCOUNTER — Other Ambulatory Visit: Payer: Self-pay | Admitting: Internal Medicine

## 2021-12-02 ENCOUNTER — Other Ambulatory Visit: Payer: Self-pay

## 2021-12-02 ENCOUNTER — Telehealth: Payer: Self-pay

## 2021-12-02 NOTE — Telephone Encounter (Signed)
pregabalin (LYRICA) 150 MG capsule   -D ULTRAFINE III SHORT PEN 31G X 8 MM MISC   [id number] [place], [place] - [address]  [id number] Phone:  [phone] Fax:  [phone]

## 2021-12-02 NOTE — Telephone Encounter (Signed)
This patient called in wanting to get a brace for her left knee.the patient saids that she cannot move her foot and her hip is bothering her also. The patient says she is paralyze and the only way she can come in is by ambulance.the patient states this has been going on for about a year off and on. I will send a message to her PCP [place], [place] C5/31/20238:48 AM

## 2021-12-03 MED ORDER — BD PEN NEEDLE SHORT U/F 31G X 8 MM MISC: 1.0000 | Freq: Every day | 1 refills | Status: AC

## 2021-12-03 MED ORDER — PREGABALIN 150 MG PO CAPS: ORAL_CAPSULE | ORAL | 0 refills | Status: DC

## 2021-12-07 ENCOUNTER — Telehealth: Payer: Self-pay

## 2021-12-07 ENCOUNTER — Telehealth: Payer: Self-pay | Admitting: Dietician

## 2021-12-07 NOTE — Telephone Encounter (Signed)
Toni Hill left a voicemail message "Needs to talk with Dr. Gilford Rile, sugar is low and a few other things". Tried calling this patient. Their voicemail box is not set up. I was unable to leave a message

## 2021-12-07 NOTE — Telephone Encounter (Signed)
[facility] PT called back to request a brace for Toni Hill leg/foot contracture management.  Also for possible Botox.  2.    Request for in-home physical therapy to work on conditioning, mobility, flexion, & strength.   Call back phone [phone].

## 2021-12-08 ENCOUNTER — Telehealth: Payer: Self-pay

## 2021-12-08 NOTE — Telephone Encounter (Signed)
Return pt's call. Stated BS this am is 278. Stated her BS's were low during PT 80's.  Also stated she needs a brace for her left knee - informed pt there's a note in her chart from [facility] about a brace.

## 2021-12-08 NOTE — Telephone Encounter (Signed)
Toni Hill visited [place] this  morning. Toni Hill's CBG was 41 without breakfast, after breakfast it was 150. Blood pressure today was 90/50.  Caller has been advise to call Toni Hill PCP and or Endocrinologist with the findings. Dr. Dagoberto Hill is not in the office but she will informed on her return.  Call back phone [phone].

## 2021-12-09 ENCOUNTER — Telehealth: Payer: Self-pay

## 2021-12-09 ENCOUNTER — Telehealth: Payer: Self-pay | Admitting: *Deleted

## 2021-12-09 DIAGNOSIS — E1140 Type 2 diabetes mellitus with diabetic neuropathy, unspecified: Secondary | ICD-10-CM

## 2021-12-09 NOTE — Telephone Encounter (Signed)
Will from [facility] calling for ext 2wk3 work on right shoulder, sit up on edge of bed with less assistance. Order approved and given.

## 2021-12-09 NOTE — Telephone Encounter (Signed)
Call from [facility] with [place] about pt's BS's. Stated they were as low as 41 (after eating something up to 150) to the 240's. "They are all over the place"  Stated she's unsure if the pt is eating properly.She stated the home situation with her sister is not good so the SW will be seeing pt today. Stated while she was there, the sister was talking very mean to the pt. I will ask Christene Slates to call pt about her BS's. Thanks

## 2021-12-09 NOTE — Telephone Encounter (Addendum)
Patient has been advised to call her PCP with yesterday's findings. (Dr. Dagoberto Ligas agreed with suggestion).She stated she will call Dr. Tamala Julian.

## 2021-12-09 NOTE — Telephone Encounter (Signed)
Call to pt, she says she is on another call and cannot talk more except to say that "yesterday running low and she does not know why" I asked her if she confirmed this with a glucometer, and at that point she asked for a call back later.

## 2021-12-09 NOTE — Telephone Encounter (Signed)
Called patient about blood sugars: she states the following:  Not using a meter, only CGM.  Readings today 229 this morning, lunch 134 One said LO a few days ago (advised she should use meter to confirm low or get a number) no symptoms June 4th 44- 66 - 55-42-46 172 that morning  135 at night 6/3 199 at 1254   She says she uses Novolog and has been taking it 5-12 units depending on how high her blood sugar is and  Takes 1-3 times a day Takes Tresiba 12 units each night She reports her Weight was down to 150 but now putting it back on. Says she is eating well.   Plan to Ask front office call with an appointment two days ahead of time to follow up about her diabetes. She prefers Dr. Gilford Rile. Last OV was 07/16/21.  Plan given to [facility] to which she agreed: Do not take any more Novolog, only  take 12 units [place] before bed daily, see if she can get her meter from her previous residence or can get a sample when she comes to see [place] if pahrmacy will not give her one. Request a prescription  for a new meter and strips

## 2021-12-10 MED ORDER — ONETOUCH VERIO VI STRP: ORAL_STRIP | 3 refills | Status: DC

## 2021-12-10 MED ORDER — ONETOUCH VERIO REFLECT W/DEVICE KIT: PACK | 0 refills | Status: DC

## 2021-12-10 NOTE — Telephone Encounter (Signed)
Thanks for the update Butch Penny. I agree with holding the short-acting for now. I will sign the prescriptions.

## 2021-12-10 NOTE — Telephone Encounter (Signed)
The Novolog insulin is not on her medication list nor did Dr. Gilford Rile mention that she was on when he saw her in January.

## 2021-12-11 NOTE — Telephone Encounter (Signed)
Okay, I see.

## 2021-12-14 ENCOUNTER — Telehealth: Payer: Self-pay

## 2021-12-14 NOTE — Telephone Encounter (Signed)
Returned call to [place], [place]. Reporting today's CBG 220 after eating chips. Patient still holding short acting insulin as instructed on 12/09/21.  [name] is also, requesting a splint for right leg to help with contractures. She will fax over exact name of splint she is requesting. Orthopedic Provider has refused this request.

## 2021-12-14 NOTE — Telephone Encounter (Signed)
Toni Hill with [facility] requesting to speak with a nurse about pt blood sugar 220. Please call back.

## 2021-12-15 NOTE — Telephone Encounter (Signed)
Called pt to schedule an appt - no answer and mailbox is full; unable to leave a message.

## 2021-12-21 ENCOUNTER — Ambulatory Visit: Payer: Medicare Other | Admitting: Behavioral Health

## 2021-12-21 ENCOUNTER — Telehealth: Payer: Self-pay | Admitting: Behavioral Health

## 2021-12-21 DIAGNOSIS — F419 Anxiety disorder, unspecified: Secondary | ICD-10-CM

## 2021-12-21 DIAGNOSIS — F331 Major depressive disorder, recurrent, moderate: Secondary | ICD-10-CM

## 2021-12-21 NOTE — Telephone Encounter (Signed)
Unsuccessful attempt to reach Pt today. Contacted Str Marybelle Killings @ [phone]. Str sts Pt is w/an RN that visits weekly currently. Told Str Clinician will try Pt again later. Str agreed & thanked Clinician for support provided by [facility].  Dr. Theodis Shove

## 2021-12-21 NOTE — BH Specialist Note (Signed)
Integrated Behavioral Health via Telemedicine Visit  12/21/2021 ARNISHA LAFFOON [id number]  Number of [place] Clinician visits: 50 Session Start time: 1300 Session End time: 1330 Total time in minutes: 30 min  Referring Provider: Dr. Gilford Rile, MD Patient/Family location: Pt is home in private [facility] Provider location: [facility] Office All persons participating in visit: Pt & Clinician Types of Service: Individual psychotherapy  I connected with [name] and/or Karen Chafe Schlageter's  self  via  Telephone or Video Enabled Telemedicine Application  (Video is Caregility application) and verified that I am speaking with the correct person using two identifiers. Discussed confidentiality: Yes   I discussed the limitations of telemedicine and the availability of in person appointments.  Discussed there is a possibility of technology failure and discussed alternative modes of communication if that failure occurs.  I discussed that engaging in this telemedicine visit, they consent to the provision of behavioral healthcare and the services will be billed under their insurance.  Patient and/or legal guardian expressed understanding and consented to Telemedicine visit: Yes   Presenting Concerns: Patient and/or family reports the following symptoms/concerns: elevated anx/dep due to health status changes Duration of problem: since April 2022; Severity of problem: moderate  Patient and/or Family's Strengths/Protective Factors: Concrete supports in place (healthy food, safe environments, etc.) and Sense of purpose  Goals Addressed: Patient will:  Reduce symptoms of: anxiety and depression   Increase knowledge and/or ability of: coping skills and stress reduction   Demonstrate ability to: Increase healthy adjustment to current life circumstances  Progress towards Goals: Ongoing  Interventions: Interventions utilized:  Supportive Counseling Standardized Assessments completed:   screeners prn  Patient and/or Family Response: Pt is receptive to call today.  Assessment: Patient currently experiencing elevated anx/dep due to health status change in 2022 which resulted in being bed bound.   Patient may benefit from activities that keep her mind occupied & her brain engaged.  Plan: Follow up with behavioral health clinician on : TBD Behavioral recommendations: cont all therapies as ordered & schedule w/[facility] Referral(s): [place] (In Clinic)  I discussed the assessment and treatment plan with the patient and/or parent/guardian. They were provided an opportunity to ask questions and all were answered. They agreed with the plan and demonstrated an understanding of the instructions.   They were advised to call back or seek an in-person evaluation if the symptoms worsen or if the condition fails to improve as anticipated.  Donnetta Hutching, LMFT

## 2021-12-23 ENCOUNTER — Other Ambulatory Visit: Payer: Self-pay | Admitting: Internal Medicine

## 2021-12-26 ENCOUNTER — Encounter: Payer: Self-pay | Admitting: *Deleted

## 2021-12-30 ENCOUNTER — Ambulatory Visit: Payer: Medicare Other | Admitting: Behavioral Health

## 2021-12-30 NOTE — BH Specialist Note (Unsigned)
Integrated Behavioral Health via Telemedicine Visit  12/30/2021 Toni Hill [id number]  Number of [place] Clinician visits: No data recorded Session Start time: No data recorded  Session End time: No data recorded Total time in minutes: No data recorded  Referring Provider: *** Patient/Family location: *** [facility] Provider location: *** All persons participating in visit: *** Types of Service: {CHL AMB TYPE OF [phone]}  I connected with Toni Hill and/or Toni Hill's {family members:20773} via  Telephone or Video Enabled Telemedicine Application  (Video is Caregility application) and verified that I am speaking with the correct person using two identifiers. Discussed confidentiality: {YES/NO:21197}  I discussed the limitations of telemedicine and the availability of in person appointments.  Discussed there is a possibility of technology failure and discussed alternative modes of communication if that failure occurs.  I discussed that engaging in this telemedicine visit, they consent to the provision of behavioral healthcare and the services will be billed under their insurance.  Patient and/or legal guardian expressed understanding and consented to Telemedicine visit: {YES/NO:21197}  Presenting Concerns: Patient and/or family reports the following symptoms/concerns: *** Duration of problem: ***; Severity of problem: {Mild/Moderate/Severe:20260}  Patient and/or Family's Strengths/Protective Factors: {CHL AMB BH PROTECTIVE FACTORS:[phone]}  Goals Addressed: Patient will:  Reduce symptoms of: {IBH Symptoms:21014056}   Increase knowledge and/or ability of: {IBH Patient Tools:21014057}   Demonstrate ability to: {IBH Goals:21014053}  Progress towards Goals: {CHL AMB BH PROGRESS TOWARDS [phone]}  Interventions: Interventions utilized:  {IBH Interventions:21014054} Standardized Assessments completed: {IBH Screening  Tools:21014051}  Patient and/or Family Response: ***  Assessment: Patient currently experiencing ***.   Patient may benefit from ***.  Plan: Follow up with behavioral health clinician on : *** Behavioral recommendations: *** Referral(s): {IBH Referrals:21014055}  I discussed the assessment and treatment plan with the patient and/or parent/guardian. They were provided an opportunity to ask questions and all were answered. They agreed with the plan and demonstrated an understanding of the instructions.   They were advised to call back or seek an in-person evaluation if the symptoms worsen or if the condition fails to improve as anticipated.  Donnetta Hutching, LMFT

## 2021-12-31 ENCOUNTER — Ambulatory Visit: Payer: Medicare Other | Admitting: Behavioral Health

## 2021-12-31 ENCOUNTER — Telehealth: Payer: Self-pay | Admitting: Behavioral Health

## 2021-12-31 NOTE — Telephone Encounter (Signed)
Unsuccessful attempts to reach Pt & her Str Jana Half for today's session. Scheduled this appt w/Str on Wed. Pt phone sts, Mbox is full. Pt has expressed in the past she turns the ringer off. She may not hear the ring today. Called Str & lft msg reminding her to "nudge" her Str to answer the call!  Dr. Theodis Shove

## 2022-01-08 ENCOUNTER — Encounter
Payer: Medicare Other | Attending: Physical Medicine and Rehabilitation | Admitting: Physical Medicine and Rehabilitation

## 2022-01-09 ENCOUNTER — Other Ambulatory Visit: Payer: Self-pay | Admitting: Student

## 2022-01-09 ENCOUNTER — Other Ambulatory Visit: Payer: Self-pay | Admitting: Internal Medicine

## 2022-01-09 DIAGNOSIS — E785 Hyperlipidemia, unspecified: Secondary | ICD-10-CM

## 2022-01-12 ENCOUNTER — Telehealth: Payer: Self-pay | Admitting: *Deleted

## 2022-01-12 NOTE — Telephone Encounter (Signed)
Toni Hill from [place] called to report Toni Hill blood sugars have been running low to high of 50 -300. Toni Hill is in the the process of trying to get a new PCP . They have reached out to Toni Chou NP that makes house visits but is not established yet.  This is just reporting the BS as they are required to do.

## 2022-01-17 ENCOUNTER — Other Ambulatory Visit: Payer: Self-pay | Admitting: Internal Medicine

## 2022-01-18 NOTE — Telephone Encounter (Signed)
Next appt scheduled 02/02/22 with PCP.

## 2022-01-19 ENCOUNTER — Telehealth: Payer: Self-pay | Admitting: *Deleted

## 2022-01-19 ENCOUNTER — Telehealth: Payer: Self-pay

## 2022-01-19 NOTE — Telephone Encounter (Signed)
[name] called to request approval to extend PT therapy. I have given her the approval however Toni Hill no showed her appt with Dr Dagoberto Ligas 01/08/22 and needs to make a follow up appt with Dr Dagoberto Ligas.

## 2022-01-19 NOTE — Telephone Encounter (Signed)
Pa for pt( LIDOCAINE 5% PATCHES )  came through on cover my  meds was submitted with last office notes ... Awaiting approval or denial

## 2022-01-19 NOTE — Telephone Encounter (Signed)
Decision :     Out come  Approved today  Your request has been approved  Drug Lidocaine 5% patches  Form [employer] Electronic PA Form (2017 NCPDP)  Original Claim Info [id number] PA REQUIRED-MD [facility]TACT [facility] CAREMARKDIAGNOSIS REQUIRED, MD CONTACT FOR PA.DRUG REQUIRES PRIOR AUTHORIZATION(PHARMACY HELP DESK [phone])     ( Copy sent to pharmacy also )

## 2022-02-02 ENCOUNTER — Ambulatory Visit: Payer: Medicare Other

## 2022-02-02 ENCOUNTER — Encounter: Payer: Medicare Other | Admitting: Internal Medicine

## 2022-02-02 ENCOUNTER — Encounter: Payer: Self-pay | Admitting: Internal Medicine

## 2022-02-02 NOTE — Progress Notes (Deleted)
HLD: Patient reports compliance* with Zetia 10 mg daily and Simvastatin 40 mg daily. Lipid panel last obtained in January 2023.  HCM: Did she speak with PACE program?  CKD Stage 3b: BMP 07/2021   Hypothyroid: Managed with Synthroid 137 mcg daily. She is compliant with her medication and takes it as indicated*. TSH 07/2021 was WNL. She is not having any symptoms at this time*.  DM: HbA1c today of *, from * in January. Tyler Aas was increased to 12 units at that time  HTN: Not on medical therapy at this time and BP measurements are WNL.  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 She is currently on Lamictal 100 mg BID, has tried lidocaine patches with some relief. She has also been evaluated by orthopedics who do not feel that she is a good surgical candidate, and who suggested considering botox injections for the contractures.

## 2022-02-03 ENCOUNTER — Telehealth: Payer: Self-pay | Admitting: *Deleted

## 2022-02-03 NOTE — Telephone Encounter (Signed)
  Care Management   Outreach Note  02/03/2022 Name: Toni Hill MRN: [id number] DOB: 1946/05/26  An unsuccessful telephone outreach was attempted today. The patient was referred to the case management team for assistance with care management and care coordination.   Follow Up Plan:  The care management team will reach out to the patient again over the next 14 days.   Eduard Clos MSW, LCSW Licensed Clinical Social Worker    [phone]

## 2022-02-08 ENCOUNTER — Other Ambulatory Visit: Payer: Self-pay

## 2022-02-08 NOTE — Patient Outreach (Signed)
  Care Coordination   outreach  Visit Note   02/08/2022 Name: Toni Hill MRN: [id number] DOB: [birth year]  Toni Hill is a 76 y.o. year old female who sees Farrel Gordon, DO for primary care. I spoke with  Toni Hill by phone today  What matters to the patients health and wellness today?  Placed call to patient to explain [facility] care coordination program.  Patient has consented to services. Patient was listed with PCP of Dr. Reesa Chew but now how switched to new PCP ( appointment tomorrow) with Farrel Gordon.  Patient reports she needs all the help she can get. Patient reports she rides an ambulance to MD office visits due to being paralysis.   Message sent to  care guide to get patient scheduled with RN.  SDOH assessments and interventions completed:  No     Care Coordination Interventions Activated:  No  Care Coordination Interventions:  No, not indicated   Follow up plan: Referral made to care guide for scheduling    Encounter Outcome:  Pt. Scheduled   Tomasa Rand RN, BSN, CEN RN Case Manager - [employer] Mobile: [phone]

## 2022-02-10 ENCOUNTER — Ambulatory Visit (INDEPENDENT_AMBULATORY_CARE_PROVIDER_SITE_OTHER): Payer: 59 | Admitting: Internal Medicine

## 2022-02-10 ENCOUNTER — Other Ambulatory Visit: Payer: Self-pay | Admitting: Dietician

## 2022-02-10 ENCOUNTER — Encounter: Payer: Self-pay | Admitting: Internal Medicine

## 2022-02-10 ENCOUNTER — Ambulatory Visit (INDEPENDENT_AMBULATORY_CARE_PROVIDER_SITE_OTHER): Payer: 59

## 2022-02-10 VITALS — BP 115/66 | HR 74 | Temp 97.0°F | Ht 69.0 in

## 2022-02-10 VITALS — BP 115/66 | HR 78 | Temp 97.8°F | Ht 69.0 in

## 2022-02-10 DIAGNOSIS — I10 Essential (primary) hypertension: Secondary | ICD-10-CM

## 2022-02-10 DIAGNOSIS — K219 Gastro-esophageal reflux disease without esophagitis: Secondary | ICD-10-CM

## 2022-02-10 DIAGNOSIS — Z794 Long term (current) use of insulin: Secondary | ICD-10-CM | POA: Diagnosis not present

## 2022-02-10 DIAGNOSIS — E1140 Type 2 diabetes mellitus with diabetic neuropathy, unspecified: Secondary | ICD-10-CM

## 2022-02-10 DIAGNOSIS — B369 Superficial mycosis, unspecified: Secondary | ICD-10-CM

## 2022-02-10 DIAGNOSIS — Z0000 Encounter for general adult medical examination without abnormal findings: Secondary | ICD-10-CM | POA: Diagnosis not present

## 2022-02-10 DIAGNOSIS — G8313 Monoplegia of lower limb affecting right nondominant side: Secondary | ICD-10-CM

## 2022-02-10 DIAGNOSIS — E785 Hyperlipidemia, unspecified: Secondary | ICD-10-CM

## 2022-02-10 DIAGNOSIS — E039 Hypothyroidism, unspecified: Secondary | ICD-10-CM

## 2022-02-10 LAB — POCT GLYCOSYLATED HEMOGLOBIN (HGB A1C): Hemoglobin A1C: 8.2 % — AB (ref 4.0–5.6)

## 2022-02-10 LAB — GLUCOSE, CAPILLARY: Glucose-Capillary: 196 mg/dL — ABNORMAL HIGH (ref 70–99)

## 2022-02-10 MED ORDER — TRESIBA FLEXTOUCH 100 UNIT/ML ~~LOC~~ SOPN: 12.0000 [IU] | PEN_INJECTOR | Freq: Every day | SUBCUTANEOUS | 2 refills | Status: AC

## 2022-02-10 MED ORDER — ACCU-CHEK SOFTCLIX LANCETS MISC: 3 refills | Status: DC

## 2022-02-10 MED ORDER — INSULIN ASPART 100 UNIT/ML IJ SOLN: INTRAMUSCULAR | 11 refills | Status: AC

## 2022-02-10 MED ORDER — ACCU-CHEK GUIDE VI STRP: ORAL_STRIP | 3 refills | Status: DC

## 2022-02-10 MED ORDER — ACCU-CHEK GUIDE W/DEVICE KIT: PACK | 1 refills | Status: AC

## 2022-02-10 MED ORDER — KETOCONAZOLE 2 % EX CREA: 1.0000 | TOPICAL_CREAM | Freq: Every day | CUTANEOUS | 3 refills | Status: DC

## 2022-02-10 NOTE — Patient Instructions (Signed)
Ms. Kollman,  It was a pleasure to care for you today.  For your back rash please apply the cream I have prescribed once daily to dry skin after a bath. Your bath should consist of soapy, warm water and your skin needs to be fully dried every day. I want you to follow up with a telehealth visit in 1 week to let me know how it is improving.  For your diabetes, you should take 6 units of novolog with breakfast and 10 units with lunch and dinner. Continue taking tresiba 12 units.  Continue taking your current thyroid medicine.  My best, Dr. Marlou Sa

## 2022-02-10 NOTE — Assessment & Plan Note (Addendum)
Last HbA1c in January 2023 was 7.8%.  Current management is Tresiba 15 units daily and NovoLog sliding scale insulin with meals.  HbA1c today is 8.2%.  Lissa Hoard wore the CGM for 14 days. The average reading was 186, % time in target was 44, % time below target was 4, and % time above target was 52%.  When she experiences low blood sugar it tends to be around 9 AM and again around 5 to 6 PM.  She states that she does not take her insulin until her food is in front of her at mealtime.  She was previously managed outpatient by an endocrinologist who she has not seen in some time. Plan: Will continue Tresiba 12 units daily.  Patient advised to adjust NovoLog dose to 6 units in the morning but continue 10 units with lunch and dinner.  Patient has established CKD so there is no need in obtaining urine to assess for proteinuria.  She will need follow-up for diabetes in 3 months.

## 2022-02-10 NOTE — Progress Notes (Unsigned)
CC: General follow-up, itchy rash on back  HPI:  Toni Hill is a 76 y.o. patient with past medical history as detailed below who presents today for general follow-up with complaint of itchy rash on her back.  Please see promise charting for detail assessment and plan.  Past Medical History:  Diagnosis Date   At risk for adverse drug event 01/22/2019     RIOSORD (Risk Index for Overdose or Serious Opioid -induced Respiratory Depression Risk ) is a tool which determines that risk over the  ensuing 6 months . Based on point total for personal medical co-morbidities and present drug therapies ; the risk : benefit ratio of any drug regimen can be defined and informed consent documented. Reference: [facility] October 2017    Bilateral lower extremity edema    right > left   Bipolar 1 disorder ([place])    CKD (chronic kidney disease) stage 3, GFR 30-59 ml/min (HCC)     Dr Lawson Radar, [facility]   Constipation 06/06/2020   Diabetes, polyneuropathy ([place])    FEET AND FINGER TIPS   Fibromyalgia    GERD (gastroesophageal reflux disease)    History of chronic gastritis    History of COVID-24 Aug 2019   Hyperlipidemia    Hypertension    per pt was take off bp medication because of kidney disease told by her nephrologist   Hypothyroidism    Intractable nausea and vomiting 01/02/2020   Mixed incontinence urge and stress    OA (osteoarthritis)    KNEES   OAB (overactive bladder)    PONV (postoperative nausea and vomiting)    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  Physical Exam:  Vitals:   02/10/22 1037  BP: 115/66  Pulse: 74  Temp: (!) 97 F (36.1 C)  TempSrc: Oral  Height: '5\' 9"'$  (1.753 m)   Constitutional: Patient appears stated age, in no acute distress laying on ambulance stretcher. Cardio: Regular rate and  rhythm. Pulm: Clear to auscultation bilaterally.  Normal work of breathing on room air. MSK: Negative for extremity edema.  Positive for flexion contracture of left knee and to her flexion of right foot. Skin: See photo below.  Rash covers entire posterior thorax with multiple excoriations overlying erythematous and irritated appearing skin.  The rash is extending laterally, presently on the back. Neuro: Alert and oriented x 3. Psych: Normal mood and affect.    Assessment & Plan:   See Encounters Tab for problem based charting.  Type 2 diabetes mellitus (HCC) Last HbA1c in January 2023 was 7.8%.  Current management is Tresiba 15 units daily and NovoLog sliding scale insulin with meals.  HbA1c today is 8.2%.  Toni Hill wore the CGM for 14 days. The average reading was 186, % time in target was 44, % time below target was 4, and % time above target was 52%.  When she experiences low blood sugar it tends to be around 9 AM and again around 5 to 6 PM.  She states that she does not take her insulin until her food is in front of her at mealtime.  She was previously managed outpatient by an endocrinologist who she has not seen in some time. Plan: Will continue Tresiba 12 units daily.  Patient advised to adjust  NovoLog dose to 6 units in the morning but continue 10 units with lunch and dinner.  Patient has established CKD so there is no need in obtaining urine to assess for proteinuria.  She will need follow-up for diabetes in 3 months.  Essential hypertension BP well controlled today at 115/66, not currently on therapy. Plan:No medications started at today's visit.  Esophageal reflux Patient reports that if she does not take Protonix she feels symptoms of GERD and does still need this medication. Plan:Continue protonix.  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 Plan: Bedside commode order placed.  Hypothyroidism  (acquired) Patient currently taking Synthroid 137 mcg daily.  She denies any symptoms related to her thyroid. Plan: Continue Synthroid 137 mcg daily.  She will be due for recheck thyroid labs in or around January.  Hyperlipidemia Current regimen is Setia 10 mg daily and simvastatin 40 mg daily.  Lipid panel last checked in January 2023 and LDL was above goal at 123. Plan: Continue current regimen.  Will need recheck of lipid panel in or around January.  Fungal infection of skin Patient complains today of a extremely itchy rash that covers her entire posterior thorax that she says has been present for about 1 year.  On further questioning she states that she has not had a proper bath in at least 1 year including cleaning of her skin with soap and warm water.  Often when she does get cleaned it is a quick wipe down with water and the skin is not dried fully before her clothes are put back on.  Exam is significant for scabbing and excoriations over the entire posterior thorax with erythematous and irritated appearing skin.  Rash expands laterally but is present only on the back.  She denies knowledge of any insect bites.  She spends all of her time in her bed but to her knowledge she just not have any trouble with bedbugs. Assessment: Skin rash concerning for some sort of fungal source.  She says that she does have someone who is able to help apply prescription cream over the rash. Plan: I have sent in ketoconazole cream to be applied once daily to the entire back.  I have advised that the patient should receive baths with warm soapy water and that her skin needs to be fully dried before applying the cream or close.  We will follow-up with a telehealth visit to follow-up on how she is doing in 1 week as she is severely limited on her ability to get to the office.  Patient seen with Dr. Dareen Piano

## 2022-02-10 NOTE — Telephone Encounter (Signed)
Toni Hill requests a meter and testing supplies for her diabetes

## 2022-02-10 NOTE — Progress Notes (Unsigned)
Subjective:   Toni Hill is a 76 y.o. female who presents for an Initial Medicare Annual Wellness Visit. I connected with  Toni Hill on 02/10/22 by a  Face-To-Face  enabled telemedicine application and verified that I am speaking with the correct person using two identifiers.  Patient Location: Other:  Office/Clinic  Provider Location: Office/Clinic  I discussed the limitations of evaluation and management by telemedicine. The patient expressed understanding and agreed to proceed.  Review of Systems    Defer to PCP       Objective:    Today's Vitals   02/10/22 1106 02/10/22 1107  BP: 115/66   Pulse: 74   Temp: 97.8 F (36.6 C)   TempSrc: Oral   SpO2: 97%   Height: $Remove'5\' 9"'fKsPaZN$  (1.753 m)   PainSc:  5    Body mass index is 22.45 kg/m.     02/10/2022   11:08 AM 02/10/2022   10:40 AM 07/16/2021    2:45 PM 06/05/2021    1:45 PM 02/03/2021    9:25 AM 01/16/2021    9:51 AM 01/15/2021    2:09 PM  Advanced Directives  Does Patient Have a Medical Advance Directive? No No No No No No No  Would patient like information on creating a medical advance directive? No - Patient declined No - Patient declined No - Patient declined  No - Patient declined No - Patient declined No - Patient declined    Current Medications (verified) Outpatient Encounter Medications as of 02/10/2022  Medication Sig   acetaminophen (TYLENOL) 500 MG tablet Take 1,000 mg by mouth in the morning and at bedtime.   Blood Glucose Monitoring Suppl (ONETOUCH VERIO REFLECT) w/Device KIT Use to check blood sugar up to one time daily when how you feel does not match CGM or you do not get number on your CGM   Calcium Carb-Cholecalciferol (CALCIUM 600/VITAMIN D3) 600-800 MG-UNIT TABS Take 1 tablet by mouth daily.    cephALEXin (KEFLEX) 250 MG capsule Take 250 mg by mouth at bedtime.   ezetimibe (ZETIA) 10 MG tablet TAKE 1 TABLET BY MOUTH EVERY DAY IN THE EVENING   glucose blood (ONETOUCH VERIO) test strip Check blood sugar  1 times a day   insulin degludec (TRESIBA FLEXTOUCH) 100 UNIT/ML FlexTouch Pen Inject 12 Units into the skin daily.   Insulin Pen Needle (B-D ULTRAFINE III SHORT PEN) 31G X 8 MM MISC 1 each by Does not apply route daily. Diagnosis code: E11.9. Used to inject insulin daily.   lamoTRIgine (LAMICTAL) 100 MG tablet Take 1 tablet (100 mg total) by mouth 2 (two) times daily. For nerve pain and shoulder pain   levothyroxine (SYNTHROID) 137 MCG tablet Take 1 tablet (137 mcg total) by mouth daily before breakfast. Brand name only   lidocaine (LIDODERM) 5 % PLACE 1 PATCH ONTO THE SKIN DAILY. REMOVE & DISCARD PATCH WITHIN 12 HOURS OR AS DIRECTED BY MD   ondansetron (ZOFRAN) 4 MG tablet Take 1 tablet (4 mg total) by mouth 2 (two) times daily as needed for nausea or vomiting.   OneTouch Delica Lancets 51V MISC Check blood sugar 3 times a day   pantoprazole (PROTONIX) 40 MG tablet TAKE 1 TABLET BY MOUTH EVERY DAY   pregabalin (LYRICA) 150 MG capsule TAKE 1 CAPSULE BY MOUTH THREE TIMES A DAY   sertraline (ZOLOFT) 100 MG tablet TAKE 2 TABLETS BY MOUTH EVERY DAY   simvastatin (ZOCOR) 40 MG tablet Take 1 tablet (40 mg total) by  mouth daily.   tiZANidine (ZANAFLEX) 4 MG tablet SMARTSIG:1 Tablet(s) By Mouth Morning-Night   VITAMIN E PO Take 4,000 Units by mouth 2 (two) times daily.   No facility-administered encounter medications on file as of 02/10/2022.    Allergies (verified) Bactrim [sulfamethoxazole-trimethoprim], Cephalexin, Gabapentin, Morphine and related, Vioxx [rofecoxib], and Penicillins   History: Past Medical History:  Diagnosis Date   At risk for adverse drug event 01/22/2019     RIOSORD (Risk Index for Overdose or Serious Opioid -induced Respiratory Depression Risk ) is a tool which determines that risk over the  ensuing 6 months . Based on point total for personal medical co-morbidities and present drug therapies ; the risk : benefit ratio of any drug regimen can be defined and informed consent  documented. Reference: [facility] Hospitalist October 2017    Bilateral lower extremity edema    right > left   Bipolar 1 disorder ([place])    CKD (chronic kidney disease) stage 3, GFR 30-59 ml/min (HCC)     Dr Lawson Radar, [facility]   Constipation 06/06/2020   Diabetes, polyneuropathy ([place])    FEET AND FINGER TIPS   Fibromyalgia    GERD (gastroesophageal reflux disease)    History of chronic gastritis    History of COVID-24 Aug 2019   Hyperlipidemia    Hypertension    per pt was take off bp medication because of kidney disease told by her nephrologist   Hypothyroidism    Intractable nausea and vomiting 01/02/2020   Mixed incontinence urge and stress    OA (osteoarthritis)    KNEES   OAB (overactive bladder)    PONV (postoperative nausea and vomiting)    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   Type 2 diabetes mellitus treated with insulin ([place])    Wears glasses    Past Surgical History:  Procedure Laterality Date   CHOLECYSTECTOMY OPEN  1990's   COLONOSCOPY  last one 07-17-2014   CYSTOSCOPY WITH INJECTION N/A 10/29/2016   Procedure: CYSTOSCOPY WITH INJECTION BOTOX 100 UNITS, urethral dilation;  Surgeon: Carolan Clines, MD;  Location: [place];  Service: Urology;  Laterality: N/A;   ESOPHAGOGASTRODUODENOSCOPY  last one 07-01-2016   EXCISIONAL BREAST BX  1990   BENIGN   HARDWARE REMOVAL Right 03/05/2020   Procedure: HARDWARE REMOVAL RIGHT ANKLE;  Surgeon: Leandrew Koyanagi, MD;  Location: [place];  Service: Orthopedics;  Laterality: Right;   ORIF TIBIA & FIBULA FRACTURES  2010   retained rod   TONSILLECTOMY  age 46   TOTAL KNEE ARTHROPLASTY Left 01/15/2019   Procedure: LEFT TOTAL KNEE ARTHROPLASTY;  Surgeon: Leandrew Koyanagi, MD;  Location: [facility];  Service: Orthopedics;  Laterality: Left;   VAGINAL HYSTERECTOMY  1979   VENTRAL HERNIA REPAIR  55's   Family History  Problem Relation Age of Onset   Breast cancer  Mother 20   Cancer Father        ? type   Breast cancer Sister 49   Breast cancer Maternal Grandmother        ? age   Social History   Socioeconomic History   Marital status: Legally Separated    Spouse name: Not on file   Number of children: 2   Years of education: Not on file   Highest education level: Not on file  Occupational History   Not on file  Tobacco Use   Smoking status: Never  Smokeless tobacco: Never  Vaping Use   Vaping Use: Never used  Substance and Sexual Activity   Alcohol use: No   Drug use: No   Sexual activity: Not Currently  Other Topics Concern   Not on file  Social History Narrative   Left Handed    Lives in a one story home.    Lives with sister   Social Determinants of Health   Financial Resource Strain: Low Risk  (02/10/2022)   Overall Financial Resource Strain (CARDIA)    Difficulty of Paying Living Expenses: Not hard at all  Food Insecurity: No Food Insecurity (02/10/2022)   Hunger Vital Sign    Worried About Running Out of Food in the Last Year: Never true    Ran Out of Food in the Last Year: Never true  Transportation Needs: Unmet Transportation Needs (02/10/2022)   PRAPARE - Hydrologist (Medical): Yes    Lack of Transportation (Non-Medical): Yes  Physical Activity: Inactive (02/10/2022)   Exercise Vital Sign    Days of Exercise per Week: 0 days    Minutes of Exercise per Session: 0 min  Stress: Stress Concern Present (02/10/2022)   [place]    Feeling of Stress : Very much  Social Connections: Socially Isolated (02/10/2022)   Social Connection and Isolation Panel [NHANES]    Frequency of Communication with Friends and Family: More than three times a week    Frequency of Social Gatherings with Friends and Family: More than three times a week    Attends Religious Services: Never    Marine scientist or Organizations: No    Attends Programme researcher, broadcasting/film/video: Never    Marital Status: Separated    Tobacco Counseling Counseling given: Not Answered   Clinical Intake:  Pre-visit preparation completed: Yes  Pain : 0-10 Pain Score: 5      Nutritional Risks: None Diabetes: Yes Did pt. bring in CBG monitor from home?: Yes Glucose Meter Downloaded?: Yes     Diabetic?Yes  Interpreter Needed?: No  Information entered by :: Adrain Nesbit,cma 02/10/22 11:08am   Activities of Daily Living    02/10/2022   11:09 AM 02/10/2022   10:39 AM  In your present state of health, do you have any difficulty performing the following activities:  Hearing? 0 0  Vision? 0 0  Difficulty concentrating or making decisions? 0 0  Walking or climbing stairs? 1 1  Dressing or bathing? 1 1  Doing errands, shopping? 1 1    Patient Care Team: Farrel Gordon, DO as PCP - General (Internal Medicine) [facility] as Consulting Physician Rosita Fire, MD as Consulting Physician (Nephrology) Alda Berthold, DO as Consulting Physician (Neurology) Johnney Killian, RN as Case Manager Trinidad Curet, Cleophus Molt as Social Worker  Indicate any recent [place] you may have received from other than [facility] providers in the past year (date may be approximate).     Assessment:   This is a routine wellness examination for [facility].  Hearing/Vision screen No results found.  Dietary issues and exercise activities discussed:     Goals Addressed   None   Depression Screen    02/10/2022   11:09 AM 02/10/2022   10:39 AM 09/18/2021    1:06 PM 07/16/2021    4:12 PM 01/15/2021    2:05 PM 12/16/2020   10:18 AM 09/05/2020   10:37 AM  PHQ 2/9 Scores  PHQ -  2 Score 0 0 $R'6 2 6 6 'NO$ 0  PHQ- 9 Score   '13 10 12 24     '$ Fall Risk    02/10/2022   11:09 AM 02/10/2022   10:39 AM 09/18/2021    1:06 PM 07/16/2021    2:44 PM 06/05/2021    1:45 PM  Fall Risk   Falls in the past year? 0 0 0 0 1  Number falls in past yr: 0 0 0 0 1  Injury with Fall? 0 0  0 1   Risk for fall due to : No Fall Risks No Fall Risks  Impaired balance/gait;Impaired mobility   Follow up Falls evaluation completed;Falls prevention discussed Falls evaluation completed;Falls prevention discussed  Falls evaluation completed;Falls prevention discussed     FALL RISK PREVENTION PERTAINING TO THE HOME:  Any stairs in or around the home? Yes  If so, are there any without handrails? No  Home free of loose throw rugs in walkways, pet beds, electrical cords, etc? Yes  Adequate lighting in your home to reduce risk of falls? Yes   ASSISTIVE DEVICES UTILIZED TO PREVENT FALLS:  Life alert? No  Use of a cane, walker or w/c? No  Grab bars in the bathroom? Yes  Shower chair or bench in shower? No  Elevated toilet seat or a handicapped toilet? No   TIMED UP AND GO:  Was the test performed? No .  Length of time to ambulate 10 feet: N/A sec.    Cognitive Function:        02/10/2022   11:09 AM  6CIT Screen  What Year? 0 points  What month? 0 points  What time? 0 points  Count back from 20 0 points  Months in reverse 0 points  Repeat phrase 0 points  Total Score 0 points    Immunizations Immunization History  Administered Date(s) Administered   Influenza, High Dose Seasonal PF 06/06/2018   Influenza,inj,Quad PF,6+ Mos 04/03/2019   Pneumococcal Conjugate-13 12/08/2016   Pneumococcal Polysaccharide-23 03/19/2013    TDAP status: Due, Education has been provided regarding the importance of this vaccine. Advised may receive this vaccine at local pharmacy or Health Dept. Aware to provide a copy of the vaccination record if obtained from local pharmacy or Health Dept. Verbalized acceptance and understanding.  Flu Vaccine status: Due, Education has been provided regarding the importance of this vaccine. Advised may receive this vaccine at local pharmacy or Health Dept. Aware to provide a copy of the vaccination record if obtained from local pharmacy or Health Dept. Verbalized  acceptance and understanding.  Pneumococcal vaccine status: Up to date  Covid-19 vaccine status: Information provided on how to obtain vaccines.   Qualifies for Shingles Vaccine? No   Zostavax completed No   Shingrix Completed?: No.    Education has been provided regarding the importance of this vaccine. Patient has been advised to call insurance company to determine out of pocket expense if they have not yet received this vaccine. Advised may also receive vaccine at local pharmacy or Health Dept. Verbalized acceptance and understanding.  Screening Tests Health Maintenance  Topic Date Due   COVID-19 Vaccine (1) Never done   URINE MICROALBUMIN  Never done   Hepatitis C Screening  Never done   TETANUS/TDAP  Never done   Zoster Vaccines- Shingrix (1 of 2) Never done   COLONOSCOPY (Pts 45-14yrs Insurance coverage will need to be confirmed)  Never done   DEXA SCAN  Never done   FOOT EXAM  04/02/2020   OPHTHALMOLOGY EXAM  08/16/2020   HEMOGLOBIN A1C  01/13/2022   INFLUENZA VACCINE  02/02/2022   Pneumonia Vaccine 33+ Years old  Completed   HPV VACCINES  Aged Out    Health Maintenance  Health Maintenance Due  Topic Date Due   COVID-19 Vaccine (1) Never done   URINE MICROALBUMIN  Never done   Hepatitis C Screening  Never done   TETANUS/TDAP  Never done   Zoster Vaccines- Shingrix (1 of 2) Never done   COLONOSCOPY (Pts 45-36yrs Insurance coverage will need to be confirmed)  Never done   DEXA SCAN  Never done   FOOT EXAM  04/02/2020   OPHTHALMOLOGY EXAM  08/16/2020   HEMOGLOBIN A1C  01/13/2022   INFLUENZA VACCINE  02/02/2022     Lung Cancer Screening: (Low Dose CT Chest recommended if Age 45-80 years, 30 pack-year currently smoking OR have quit w/in 15years.) does not qualify.   Lung Cancer Screening Referral: N/A  Additional Screening:  Hepatitis C Screening: does not qualify; Completed N/A  Vision Screening: Recommended annual ophthalmology exams for early detection of  glaucoma and other disorders of the eye. Is the patient up to date with their annual eye exam?  Yes  Who is the provider or what is the name of the office in which the patient attends annual eye exams? N/A  If pt is not established with a provider, would they like to be referred to a provider to establish care? No .   Dental Screening: Recommended annual dental exams for proper oral hygiene  Community Resource Referral / Chronic Care Management: CRR required this visit?  No   CCM required this visit?  No      Plan:     I have personally reviewed and noted the following in the patient's chart:   Medical and social history Use of alcohol, tobacco or illicit drugs  Current medications and supplements including opioid prescriptions. Patient is not currently taking opioid prescriptions. Functional ability and status Nutritional status Physical activity Advanced directives List of other physicians Hospitalizations, surgeries, and ER visits in previous 12 months Vitals Screenings to include cognitive, depression, and falls Referrals and appointments  In addition, I have reviewed and discussed with patient certain preventive protocols, quality metrics, and best practice recommendations. A written personalized care plan for preventive services as well as general preventive health recommendations were provided to patient.     Kerin Perna, [place]   02/10/2022   Nurse Notes: Face-To-Face 10 minute visit  Ms. Unk Lightning , Thank you for taking time to come for your Medicare Wellness Visit. I appreciate your ongoing commitment to your health goals. Please review the following plan we discussed and let me know if I can assist you in the future.   These are the goals we discussed:  Goals   None     This is a list of the screening recommended for you and due dates:  Health Maintenance  Topic Date Due   COVID-19 Vaccine (1) Never done   Urine Protein Check  Never done   Hepatitis C  Screening: USPSTF Recommendation to screen - Ages 25-79 yo.  Never done   Tetanus Vaccine  Never done   Zoster (Shingles) Vaccine (1 of 2) Never done   Colon Cancer Screening  Never done   DEXA scan (bone density measurement)  Never done   Complete foot exam   04/02/2020   Eye exam for diabetics  08/16/2020   Hemoglobin A1C  01/13/2022  Flu Shot  02/02/2022   Pneumonia Vaccine  Completed   HPV Vaccine  Aged Out

## 2022-02-11 ENCOUNTER — Telehealth: Payer: Self-pay | Admitting: *Deleted

## 2022-02-11 DIAGNOSIS — B369 Superficial mycosis, unspecified: Secondary | ICD-10-CM | POA: Insufficient documentation

## 2022-02-11 NOTE — Assessment & Plan Note (Signed)
Current regimen is Setia 10 mg daily and simvastatin 40 mg daily.  Lipid panel last checked in January 2023 and LDL was above goal at 123. Plan: Continue current regimen.  Will need recheck of lipid panel in or around January.

## 2022-02-11 NOTE — Assessment & Plan Note (Signed)
Patient is requesting a bedside commode. Plan: Bedside commode order placed.

## 2022-02-11 NOTE — Progress Notes (Signed)
Internal Medicine Clinic Attending  I saw and evaluated the patient.  I personally confirmed the key portions of the history and exam documented by Dr.  Dean  and I reviewed pertinent patient test results.  The assessment, diagnosis, and plan were formulated together and I agree with the documentation in the resident's note.  

## 2022-02-11 NOTE — Assessment & Plan Note (Signed)
Patient currently taking Synthroid 137 mcg daily.  She denies any symptoms related to her thyroid. Plan: Continue Synthroid 137 mcg daily.  She will be due for recheck thyroid labs in or around January.

## 2022-02-11 NOTE — Assessment & Plan Note (Addendum)
Patient reports that if she does not take Protonix she feels symptoms of GERD and does still need this medication. Plan:Continue protonix.

## 2022-02-11 NOTE — Chronic Care Management (AMB) (Signed)
  Care Coordination   Note   02/11/2022 Name: [name] MRN: [id number] DOB: 10-05-1945  Toni Hill is a 76 y.o. year old female who sees Farrel Gordon, DO for primary care. I reached out to [name] by phone today to offer care coordination services.  [name] was given information about Care Coordination services today including:   The Care Coordination services include support from the care team which includes your Nurse Coordinator, Clinical Social Worker, or Pharmacist.  The Care Coordination team is here to help remove barriers to the health concerns and goals most important to you. Care Coordination services are voluntary, and the patient may decline or stop services at any time by request to their care team member.   Care Coordination Consent Status: Patient agreed to services and verbal consent obtained.   Follow up plan:  Telephone appointment with care coordination team member scheduled for:  02/17/2022  Encounter Outcome:  Pt. Scheduled  Julian Hy, [place] Direct Dial: [phone]

## 2022-02-11 NOTE — Assessment & Plan Note (Signed)
BP well controlled today at 115/66, not currently on therapy. Plan:No medications started at today's visit.

## 2022-02-11 NOTE — Progress Notes (Signed)
I reviewed the AWV findings with the provider who conducted the visit. I was present in the office suite and immediately available to provide assistance and direction throughout the time the service was provided.   Farrel Gordon, DO

## 2022-02-11 NOTE — Assessment & Plan Note (Signed)
Patient complains today of a extremely itchy rash that covers her entire posterior thorax that she says has been present for about 1 year.  On further questioning she states that she has not had a proper bath in at least 1 year including cleaning of her skin with soap and warm water.  Often when she does get cleaned it is a quick wipe down with water and the skin is not dried fully before her clothes are put back on.  Exam is significant for scabbing and excoriations over the entire posterior thorax with erythematous and irritated appearing skin.  Rash expands laterally but is present only on the back.  She denies knowledge of any insect bites.  She spends all of her time in her bed but to her knowledge she just not have any trouble with bedbugs. Assessment: Skin rash concerning for some sort of fungal source.  She says that she does have someone who is able to help apply prescription cream over the rash. Plan: I have sent in ketoconazole cream to be applied once daily to the entire back.  I have advised that the patient should receive baths with warm soapy water and that her skin needs to be fully dried before applying the cream or close.  We will follow-up with a telehealth visit to follow-up on how she is doing in 1 week as she is severely limited on her ability to get to the office.

## 2022-02-17 ENCOUNTER — Ambulatory Visit: Payer: Self-pay

## 2022-02-18 ENCOUNTER — Other Ambulatory Visit: Payer: Self-pay | Admitting: *Deleted

## 2022-02-18 ENCOUNTER — Encounter: Payer: Self-pay | Admitting: *Deleted

## 2022-02-18 NOTE — Patient Instructions (Signed)
Visit Information  Thank you for taking time to visit with me today. Please don't hesitate to contact me if I can be of assistance to you.   Following are the goals we discussed today:   Goals Addressed             This Visit's Progress    I cant afford my medication       Care Coordination Interventions: Reviewed medications with patient and discussed  Collaborated with Radene Journey LCSW regarding patient Called [facility] and spoke with Sonia Baller to go over medication cost Collaborated with Supervisor to see about helping the patient with more resources. Active listening / Reflection utilized  Emotional Support Provided Problem [employer] strategies reviewed         Our next appointment is by telephone on 8/30 at 2 pm  Please call the care guide team at [phone] if you need to cancel or reschedule your appointment.   If you are experiencing a Mental Health or [place] or need someone to talk to, please call 1-800-273-TALK (toll free, 24 hour hotline)  Patient verbalizes understanding of instructions and care plan provided today and agrees to view in [place]. Active MyChart status and patient understanding of how to access instructions and care plan via MyChart confirmed with patient.     Lazaro Arms RN, BSN, [facility]   Phone: [phone]

## 2022-02-18 NOTE — Patient Outreach (Signed)
  Care Coordination   Initial Visit Note   02/18/2022 Name: Toni Hill MRN: [id number] DOB: 12/23/1945  Toni Hill is a 76 y.o. year old female who sees Toni Gordon, DO for primary care. I spoke with  Toni Hill by phone today  What matters to the patients health and wellness today?  [place] of my medications    Goals Addressed             This Visit's Progress    I cant afford my medication       Care Coordination Interventions: Reviewed medications with patient and discussed  Collaborated with Toni Journey LCSW regarding patient [facility] and spoke with Toni Hill to go over medication cost Collaborated with Supervisor to see about helping the patient with more resources. Active listening / Reflection utilized  Emotional Support Provided Problem [place] strategies reviewed         SDOH assessments and interventions completed:  Yes  SDOH Interventions Today    Flowsheet Row Most Recent Value  SDOH Interventions   Food Insecurity Interventions Intervention Not Indicated  Housing Interventions Intervention Not Indicated        Care Coordination Interventions Activated:  Yes  Care Coordination Interventions:  Yes, provided   Follow up plan: Follow up call scheduled for 8/30 at 2 pm    Encounter Outcome:  Pt. Scheduled   Toni Arms RN, BSN, [facility]   Phone: [phone]

## 2022-02-18 NOTE — Patient Outreach (Signed)
  Care Coordination   Initial Visit Note   02/18/2022 Name: Toni Hill MRN: [id number] DOB: 1946-06-15  Toni Hill is a 76 y.o. year old female who sees Farrel Gordon, DO for primary care. I spoke with  [name]' sister, Marybelle Killings, by phone today.  What matters to the patients health and wellness today?  HELPING MY SISTER GET THE THINGS SHE NEEDS. Interventions planned: Make home visit. Assess in home needs/DME.        Verify eye exam and provider                   Verify dentist and last appt                   Arrange for virtual colonoscopy and Tdap                   Perform foot exam     We agreed to have a house call tomorrow at 3:00 pm.  SDOH assessments and interventions completed:  Yes   Care Coordination Interventions Activated:  Yes  Care Coordination Interventions:  Yes, provided   Follow up plan:  Home assessment scheduled for tomorrow.    Encounter Outcome:  Pt. Visit Completed   Kayleen Memos C. Myrtie Neither, MSN, [facility] Gerontological Nurse Practitioner [facility] Care Management [phone]

## 2022-02-19 ENCOUNTER — Other Ambulatory Visit: Payer: Self-pay | Admitting: *Deleted

## 2022-02-19 ENCOUNTER — Encounter: Payer: Self-pay | Admitting: *Deleted

## 2022-02-19 NOTE — Patient Outreach (Signed)
[facility]) Care Management Geriatric Nurse Practitioner Note   02/19/2022 Name:  Toni Hill MRN:  [id number] DOB:  08-07-1945  Summary: Bed bound. 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  Recommendations/Changes made from today's visit: ***  Subjective: Toni Hill is an 76 y.o. year old female who is a primary patient of Champ Mungo, DO. The care management team was consulted for assistance with care management and/or care coordination needs.    Geriatric Nurse Practitioner completed Home Visit today.  Objective:  VS: 96% - 68 - 18 - 98/46 NFBS 349 R middle finger enlarged joint is very painful  CONSTIPATION  RCATS - SISTER  SKIN - USE PERI WASH FOR ONLY PERINEAL AREA  fAMILY DYSFUNTION  AMLODIPINE ?????  Medications Reviewed Today     Reviewed by Almetta Lovely, NP (Nurse Practitioner) on 02/18/22 at 0950  Med List Status: <None>   Medication Order Taking? Sig Documenting Provider Last Dose Status Informant  Accu-Chek Softclix Lancets lancets [id number] No Use to check blood sugar up to 1 time a day Champ Mungo, DO Taking Active   acetaminophen (TYLENOL) 500 MG tablet [id number] No Take 1,000 mg by mouth in the morning and at bedtime. [provider] Taking Active Self  Blood Glucose Monitoring Suppl (ACCU-CHEK GUIDE) w/Device KIT [id number] No Check blood sugar 1 times per day. Champ Mungo, DO Taking Active   Calcium Carb-Cholecalciferol (CALCIUM 600/VITAMIN D3) 600-800 MG-UNIT TABS [id number] No Take 1 tablet by mouth daily.  [provider] Taking Active Self  cephALEXin (KEFLEX) 250 MG capsule [id number] No Take 250 mg by mouth at bedtime. [provider] Taking Active   ezetimibe (ZETIA) 10 MG tablet [id number] No TAKE 1 TABLET BY MOUTH EVERY DAY IN THE Loretha Stapler, DO Taking Active    glucose blood (ACCU-CHEK GUIDE) test strip [id number] No Check blood sugar 1 times per day Champ Mungo, DO Taking Active   insulin aspart (NOVOLOG) 100 UNIT/ML injection [id number] No Inject 6 Units into the skin daily with breakfast AND 10 Units daily with lunch AND 10 Units daily with supper. Champ Mungo, DO Taking Active   insulin degludec (TRESIBA FLEXTOUCH) 100 UNIT/ML FlexTouch Pen [id number] No Inject 12 Units into the skin daily. Champ Mungo, DO Taking Active   Insulin Pen Needle (B-D ULTRAFINE III SHORT PEN) 31G X 8 MM MISC [id number] No 1 each by Does not apply route daily. Diagnosis code: E11.9. Used to inject insulin daily. Dolan Amen, MD Taking Active   ketoconazole (NIZORAL) 2 % cream [id number] No Apply 1 Application topically daily. Champ Mungo, DO Taking Active   lamoTRIgine (LAMICTAL) 100 MG tablet [id number] No Take 1 tablet (100 mg total) by mouth 2 (two) times daily. For nerve pain and shoulder pain Lovorn, Megan, MD Taking Active   levothyroxine (SYNTHROID) 137 MCG tablet [id number] No Take 1 tablet (137 mcg total) by mouth daily before breakfast. Brand name only Dolan Amen, MD Taking Active   lidocaine (LIDODERM) 5 % [id number] No PLACE 1 PATCH ONTO THE SKIN DAILY. REMOVE & DISCARD PATCH WITHIN 12 HOURS OR AS DIRECTED BY MD Champ Mungo, DO Taking Active   ondansetron (ZOFRAN) 4 MG tablet [id number] No Take 1 tablet (4 mg total) by mouth 2 (two) times daily as needed for nausea or vomiting. Dolan Amen, MD Taking Active   [facility] Lancets 33G [place] [id number] No  Check blood sugar 3 times a day Dolan Amen, MD Taking Active Self  pantoprazole (PROTONIX) 40 MG tablet [id number] No TAKE 1 TABLET BY MOUTH EVERY DAY Champ Mungo, DO Taking Active   pregabalin (LYRICA) 150 MG capsule [id number] No TAKE 1 CAPSULE BY MOUTH THREE TIMES A DAY Champ Mungo, DO Taking Active   sertraline (ZOLOFT) 100 MG tablet [id number] No TAKE 2 TABLETS BY MOUTH EVERY DAY Dolan Amen, MD Taking  Active   simvastatin (ZOCOR) 40 MG tablet [id number] No Take 1 tablet (40 mg total) by mouth daily. Dolan Amen, MD Taking Active   tiZANidine (ZANAFLEX) 4 MG tablet [id number] No SMARTSIG:1 Tablet(s) By Mouth Morning-Night [provider] Taking Active   VITAMIN E PO [id number] No Take 4,000 Units by mouth 2 (two) times daily. [provider] Taking Active Self             SDOH:  (Social Determinants of Health) assessments and interventions performed:     Care Plan  Review of patient past medical history, allergies, medications, health status, including review of consultants reports, laboratory and other test data, was performed as part of comprehensive evaluation for care management services.   There are no care plans that you recently modified to display for this patient.    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   ***

## 2022-02-22 NOTE — Progress Notes (Signed)
Internal Medicine Clinic Attending  Case and documentation of Dr. Dean  at the time of the visit reviewed.  I reviewed the AWV findings.  I agree with the assessment, diagnosis, and plan of care documented in the AWV note.     

## 2022-02-22 NOTE — Patient Outreach (Signed)
[facility]) Care Management Geriatric Nurse Practitioner Note   02/22/2022 Name:  Toni Hill MRN:  [id number] DOB:  1946/04/10  Summary: Pt referred for a home assessment. Multiple issues (home bound, only able to leave via ambulance (?), multiple medical issues.  Recommendations/Changes made from today's visit: REQUESTS TO DR. DEAN 1)Taking amlodipine which is NOT on her current med list. (Original Rx from [facility] 2022). Today's BP in supine position was 96/50. Feel this should definitely be dc'd. Advised her caregiver I would check with Dr. Marlou Sa. 2)Needs Rx for 1)life style patches - can this be put on her abdomen per pt request                            2)Voltaren gel for OA of fingers.    3)R leg immobilizer for safe transfers, order to [place] (to avoid injury to              immobile R leg, frozen in extension)    4) Needs new PT orders from Dr. Marlou Sa to [facility] (ortho won't        do it because she missed her appt) goal to get pt to be able to transfer to her w/c with elevated legs and sit for extended time safely and comfortably (so she can get out of the house by W/C.)  3)Caregiver needs respite time and pt needs time outside of the home - will try to see if we can get her to PACE.   Subjective: Toni Hill is an 76 y.o. year old female who is a primary patient of Farrel Gordon, DO. The care management team was consulted for assistance with care management and/or care coordination needs.    Caregiver (sister} reports: pt has constipation. Sister is paid caregiver via [employer] 40 hours a week. Geriatric Nurse Practitioner completed Home Visit today.  Objective: VS: 96% - 68 - 18 - 98/46 NFBS 349 Neuro: oriented and conversant Skin: Fungal infection back much improved with application antifungal, nearly healed. Found that caregiver has been using periwash for entire body! 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 R toes contracted. Toenails long and toes encrusted with dead skin                       L leg contracted, unable to extend past approx. 100 degrees. R middle finger enlarged joint is very painful.   Note sibling quibbling that is not helpful to either.   Medications Reviewed Today     Reviewed by Deloria Lair, NP (Nurse Practitioner) on 02/19/22 at 1557  Med List Status: <None>   Medication Order Taking? Sig Documenting Provider Last Dose Status Informant  Accu-Chek Softclix Lancets lancets [id number]  Use to check blood sugar up to 1 time a day Farrel Gordon, DO  Active   acetaminophen (TYLENOL) 500 MG tablet [id number]  Take 1,000 mg by mouth in the morning and at bedtime. [provider]  Active Self  Blood Glucose Monitoring Suppl (ACCU-CHEK GUIDE) w/Device KIT [id number]  Check blood sugar 1 times per day. Farrel Gordon, DO  Active   Calcium Carb-Cholecalciferol (CALCIUM 600/VITAMIN D3) 600-800 MG-UNIT TABS [id number]  Take 1 tablet by mouth daily.  [provider]  Active Self  cephALEXin (KEFLEX) 250 MG capsule [id number]  Take 250 mg by mouth at bedtime. [provider]  Active   ezetimibe (ZETIA)  10 MG tablet [id number]  TAKE 1 TABLET BY MOUTH EVERY DAY IN THE Glennon Hamilton, DO  Active   glucose blood (ACCU-CHEK GUIDE) test strip [id number]  Check blood sugar 1 times per day Farrel Gordon, DO  Active   insulin aspart (NOVOLOG) 100 UNIT/ML injection [id number]  Inject 6 Units into the skin daily with breakfast AND 10 Units daily with lunch AND 10 Units daily with supper. Farrel Gordon, DO  Active   insulin degludec (TRESIBA FLEXTOUCH) 100 UNIT/ML FlexTouch Pen [id number]  Inject 12 Units into the skin daily. Farrel Gordon, DO  Active   Insulin Pen Needle (B-D ULTRAFINE III SHORT PEN) 31G X 8 MM MISC [id number]  1 each by Does not apply route daily. Diagnosis code: E11.9. Used to inject insulin daily. Maudie Mercury, MD  Active   ketoconazole (NIZORAL) 2 %  cream [id number]  Apply 1 Application topically daily. Farrel Gordon, DO  Active   lamoTRIgine (LAMICTAL) 100 MG tablet [id number]  Take 1 tablet (100 mg total) by mouth 2 (two) times daily. For nerve pain and shoulder pain Lovorn, Megan, MD  Active   levothyroxine (SYNTHROID) 137 MCG tablet [id number]  Take 1 tablet (137 mcg total) by mouth daily before breakfast. Brand name only Maudie Mercury, MD  Active   lidocaine (LIDODERM) 5 % [id number]  PLACE 1 PATCH ONTO THE SKIN DAILY. REMOVE & DISCARD PATCH WITHIN 12 HOURS OR AS DIRECTED BY MD Farrel Gordon, DO  Active   ondansetron (ZOFRAN) 4 MG tablet [id number]  Take 1 tablet (4 mg total) by mouth 2 (two) times daily as needed for nausea or vomiting. Maudie Mercury, MD  Active   [facility] Lancets [address] [place] [id number]  Check blood sugar 3 times a day Maudie Mercury, MD  Active Self  pantoprazole (PROTONIX) 40 MG tablet [id number]  TAKE 1 TABLET BY MOUTH EVERY DAY Farrel Gordon, DO  Active   pregabalin (LYRICA) 150 MG capsule [id number]  TAKE 1 CAPSULE BY MOUTH THREE TIMES A DAY Farrel Gordon, DO  Active   sertraline (ZOLOFT) 100 MG tablet [id number]  TAKE 2 TABLETS BY MOUTH EVERY DAY Maudie Mercury, MD  Active   simvastatin (ZOCOR) 40 MG tablet [id number]  Take 1 tablet (40 mg total) by mouth daily. Maudie Mercury, MD  Active   tiZANidine (ZANAFLEX) 4 MG tablet [id number]  SMARTSIG:1 Tablet(s) By Mouth Morning-Night [provider]  Active   VITAMIN E PO [id number]  Take 4,000 Units by mouth 2 (two) times daily. [provider]  Active Self           SDOH:  (Social Determinants of Health) assessments and interventions performed:  SDOH Interventions    Flowsheet Row Most Recent Value  SDOH Interventions   Transportation Interventions Converted Appointment to Virtual Visit, Other (Comment)  [NP working with others to be able to transfer pt to w/c with elevtated R leg to be able to go in a [place] rather than ambulance.]       Goals  Addressed             This Visit's Progress    Get medical care that I need.   On track    Interventions planned: Make home visit. Assess in home needs/DME.        Verify eye exam and provider                   Verify dentist and last appt  Arrange for virtual colonoscopy and Tdap                   Perform foot exam 02/19/22 Interventions: Home visit completed. In process on making recommendations to caregiver and pt regarding arrangement of her room for BEST care and transfers. Foot exam performed and foot care provided (nails debrided, feet cleaned, instructions for diabetic foot care given to sister.           COMPLETED: I cant afford my medication       Care Coordination Interventions: Reviewed medications with patient and discussed  Collaborated with Radene Journey LCSW regarding patient Called [facility] and spoke with Sonia Baller to go over medication cost Collaborated with Supervisor to see about helping the patient with more resources. Active listening / Reflection utilized  Emotional Support Provided Problem [place] strategies reviewed        Plan:  May use miralax QOD and adjust as necessary per instructions.  Telephone follow up appointment with care management team member scheduled for:  Thursday, 02/25/22. Face to Face appointment with care management team member scheduled for: TBA The patient has been provided with contact information for the care management team and has been advised to call with any health related questions or concerns.   Eulah Pont. Myrtie Neither, MSN, [facility] Gerontological Nurse Practitioner [facility] Care Management [phone]

## 2022-02-25 ENCOUNTER — Ambulatory Visit: Payer: Self-pay | Admitting: *Deleted

## 2022-02-25 ENCOUNTER — Telehealth: Payer: Self-pay | Admitting: *Deleted

## 2022-02-25 MED ORDER — DICLOFENAC SODIUM 1 % EX GEL
1.0000 | Freq: Four times a day (QID) | CUTANEOUS | 2 refills | Status: DC
Start: 2022-02-25 — End: 2022-05-10

## 2022-02-25 NOTE — Telephone Encounter (Signed)
Call from [place] with [facility]. Stated she saw pt with recommendations for Dr Marlou Sa to review. Her note is in [place], telephone encounter 02/19/22.

## 2022-02-25 NOTE — Addendum Note (Signed)
Addended by: Renato Battles on: 02/25/2022 10:43 AM   Modules accepted: Orders

## 2022-02-25 NOTE — Patient Outreach (Addendum)
  Care Coordination   Follow Up Visit Note   02/25/2022 Name: Toni Hill MRN: [id number] DOB: 1945/08/23  Toni Hill is a 76 y.o. year old female who sees Farrel Gordon, DO for primary care. I spoke with  [name] by phone today  What matters to the patients health and wellness today?  Working on getting the right care.    Goals Addressed             This Visit's Progress    Get medical care that I need.       02/25/22 Care Coordination Interventions: Private discussion with pt today. Reports situations that required NP to make an APS referral. Also called [employer] to make report. Discussed PACE, LTC placement and concerns regarding her financial obligations. Reported ongoing progress with items we discussed last week during the home visit. Still waiting on MD to respond to requests (NP made telephone call to MD office today).  Reported talking with PT about working together to see if we can transfer pt to her W/C with elevated leg rests so she can be transferred via a [facility]        rather than an ambulance.  Interventions planned: Make home visit. Assess in home needs/DME.        Verify eye exam and provider                   Verify dentist and last appt                   Arrange for virtual colonoscopy and Tdap                   Perform foot exam 02/19/22 Interventions: Home visit completed. In process on making recommendations to caregiver and pt regarding arrangement of her room for BEST care and transfers. Foot exam performed and foot care provided (nails debrided, feet cleaned, instructions for diabetic foot care given to sister.              SDOH assessments and interventions completed:  No   Care Coordination Interventions Activated:  Yes  Care Coordination Interventions:  Yes, provided   Follow up plan: Follow up call scheduled for next week.    Encounter Outcome:  Pt. Visit Completed   Kayleen Memos C. Myrtie Neither, MSN, GNP-BC Gerontological Nurse Practitioner [place] Management [phone]  Addendum: Received response from Dr. Marlou Sa who has said pt should NOT be taking amlodipine. She has ordered Voltaran for finger joints, a leg immobilzer and HH PT.  Talked with Kenton Kingfisher, CAP Care Manager and Mickel Crow, RN, CAP supervisor to discuss case and ways to collaborate. NP to update team as case moves forward.  Toni Hill. Myrtie Neither, MSN, [facility] Gerontological Nurse Practitioner [facility] Care Management [phone]

## 2022-03-03 ENCOUNTER — Encounter: Payer: Self-pay | Admitting: *Deleted

## 2022-03-03 ENCOUNTER — Other Ambulatory Visit: Payer: Self-pay | Admitting: *Deleted

## 2022-03-03 ENCOUNTER — Ambulatory Visit: Payer: Self-pay

## 2022-03-03 NOTE — Patient Outreach (Signed)
  Care Coordination   Follow Up Visit Note   03/03/2022 Name: Toni Hill MRN: [id number] DOB: 1946-01-25  Toni Hill is a 76 y.o. year old female who sees Farrel Gordon, DO for primary care. I spoke with  [name] by phone today and Jana Half her caregiver/sister.  Mrs. Robinns reports she and her sister are feeling a lot better and working better together. [employer] had someone come out and give her a real shampoo and she is thrilled to have that done.  What matters to the patients health and wellness today?  Continuing to work together to help pt get out of the house by W/C so she can get to her MD appts by [facility] instead of ambulance.    Goals Addressed             This Visit's Progress    Get medical care that I need.       Care Coordination Interventions: Advised patient to Call MD or NP if having a problem or has questions. Collaborated with [facility], PT,  regarding potential home visit togheter to figure out how we can get pt out of the house in a W/C instead of having to use    an ambulance. Discussed plans with patient for ongoing care management follow up and provided patient with direct contact information for care management team Included pt sister/caregiver in the conversation. Both are agreeable to the plan of care.             SDOH assessments and interventions completed:  No     Care Coordination Interventions Activated:  Yes  Care Coordination Interventions:  Yes, provided   Follow up plan:  NP to coordinate with PT to make a co-visit.     Encounter Outcome:  Pt. Visit Completed   Kayleen Memos C. Myrtie Neither, MSN, [facility] Gerontological Nurse Practitioner [facility] Care Management [phone]

## 2022-03-03 NOTE — Patient Instructions (Signed)
Visit Information  Thank you for taking time to visit with me today. Please don't hesitate to contact me if I can be of assistance to you.   Following are the goals we discussed today:   Goals Addressed             This Visit's Progress    COMPLETED: I cant afford my medication       With Mrs Blatchford and she is reciedveCare Coordination Interventions: I spoke with [name], who works with [name], NP. I informed her that I would no longer be calling and that [name] was excellent professional and she was in good hands.         Please call the care guide team at [phone] if you need to cancel or reschedule your appointment.   If you are experiencing a Mental Health or [place] or need someone to talk to, please call 1-800-273-TALK (toll free, 24 hour hotline)  Patient verbalizes understanding of instructions and care plan provided today and agrees to view in [place]. Active MyChart status and patient understanding of how to access instructions and care plan via MyChart confirmed with patient.     Lazaro Arms RN, BSN, [facility]   Phone: [phone]

## 2022-03-03 NOTE — Patient Outreach (Signed)
  Care Coordination   Follow Up Visit Note   03/03/2022 Name: Toni Hill MRN: [id number] DOB: Feb 21, 1946  Toni Hill is a 76 y.o. year old female who sees Toni Gordon, DO for primary care. I spoke with  Toni Hill by phone today.  What matters to the patients health and wellness today?  I need medical help.    Goals Addressed             This Visit's Progress    COMPLETED: I cant afford my medication       With Toni Hill and she is reciedveCare Coordination Interventions: I spoke with Toni Hill, who works with my co-worker Toni Fam, NP. I informed her that I would no longer be calling and that Toni Hill was excellent professional and she was in good hands.         SDOH assessments and interventions completed:  No     Care Coordination Interventions Activated:  No  Care Coordination Interventions:  No, not indicated   Follow up plan: No further intervention required.   Encounter Outcome:  Pt. Visit Completed   Toni Arms RN, BSN, [facility]   Phone: [phone]

## 2022-03-04 ENCOUNTER — Other Ambulatory Visit: Payer: Self-pay | Admitting: Internal Medicine

## 2022-03-04 DIAGNOSIS — E1140 Type 2 diabetes mellitus with diabetic neuropathy, unspecified: Secondary | ICD-10-CM

## 2022-03-04 NOTE — Telephone Encounter (Signed)
Patient called in stating she only has a few test strips left. She tests 4 times per day. Last Rx was for once daily testing. Please send new Rx.

## 2022-03-05 ENCOUNTER — Other Ambulatory Visit: Payer: Self-pay | Admitting: Internal Medicine

## 2022-03-05 MED ORDER — ACCU-CHEK GUIDE VI STRP: ORAL_STRIP | 3 refills | Status: AC

## 2022-03-05 MED ORDER — ACCU-CHEK SOFTCLIX LANCETS MISC: 3 refills | Status: AC

## 2022-03-18 ENCOUNTER — Telehealth: Payer: Self-pay | Admitting: *Deleted

## 2022-03-18 ENCOUNTER — Other Ambulatory Visit: Payer: Self-pay | Admitting: Internal Medicine

## 2022-03-18 DIAGNOSIS — G8313 Monoplegia of lower limb affecting right nondominant side: Secondary | ICD-10-CM

## 2022-03-18 DIAGNOSIS — F444 Conversion disorder with motor symptom or deficit: Secondary | ICD-10-CM

## 2022-03-18 NOTE — Progress Notes (Signed)
Contacted by Ignacia Bayley, RN with Aging, Disability& Transit services of [facility] due to needs for  -bed wedge -pull-ups 120 month -Chux 120 month -Gloves 3 boxes per year -Bedside Commode  Order placed for supples as above.

## 2022-03-18 NOTE — Telephone Encounter (Signed)
Received fax from Ignacia Bayley, RN with CAP, requesting the following DME orders:  Bed wedges x 2 Pull ups x 120/mo Chux x 120/mo Gloves x 3 boxes/yr Bedside commode  These were faxed to her today at [phone]. Fax confirmation receipt received.

## 2022-04-08 ENCOUNTER — Other Ambulatory Visit: Payer: Self-pay | Admitting: Internal Medicine

## 2022-04-08 DIAGNOSIS — F444 Conversion disorder with motor symptom or deficit: Secondary | ICD-10-CM

## 2022-04-12 ENCOUNTER — Other Ambulatory Visit: Payer: Self-pay | Admitting: Internal Medicine

## 2022-04-12 DIAGNOSIS — M24561 Contracture, right knee: Secondary | ICD-10-CM

## 2022-04-22 ENCOUNTER — Telehealth: Payer: Self-pay | Admitting: *Deleted

## 2022-04-22 NOTE — Telephone Encounter (Signed)
Received fax from [facility]  stating that patient is changing to their Pharmacy.  Call to patient for verification-patient stated does not want to change to [facility] or any mail order pharmacy.  Has had problems with in the past.  Patient asked about her appointment with Nephrology. Call to [place] has an appointment on 04/26/2022 1:40 PM. Attempts to call patient to inform her of the appointment -unable to leave message on her VM.  RTC to patient  informed of appointment with Dr. Carolin Sicks on 04/26/2022 1:40 PM.

## 2022-04-23 ENCOUNTER — Telehealth: Payer: Self-pay | Admitting: Physical Medicine and Rehabilitation

## 2022-04-23 NOTE — Telephone Encounter (Unsigned)
Request was sent via fax and confirming receipt of fax for Lamotrigine

## 2022-05-02 ENCOUNTER — Other Ambulatory Visit: Payer: Self-pay | Admitting: Internal Medicine

## 2022-05-02 DIAGNOSIS — F444 Conversion disorder with motor symptom or deficit: Secondary | ICD-10-CM

## 2022-05-06 ENCOUNTER — Telehealth: Payer: Self-pay | Admitting: *Deleted

## 2022-05-06 ENCOUNTER — Encounter: Payer: Self-pay | Admitting: *Deleted

## 2022-05-06 NOTE — Patient Outreach (Signed)
  Care Coordination   Follow Up Visit Note   05/06/2022 Name: Toni Hill MRN: [id number] DOB: 25-Apr-1946  Toni Hill is a 76 y.o. year old female who sees Farrel Gordon, DO for primary care. I spoke with  Toni Hill by phone today.  What matters to the patients health and wellness today?  Getting PT, leg immobilizer, help to get out of bed to W/C for transportation if at all possible.    Goals Addressed             This Visit's Progress    Get medical care that I need.   Not on track    Care Coordination Interventions: Evaluation of current treatment plan related to MOBILITY and patient's adherence to plan as established by provider Collaborated with DR. DEAN regarding PT ORDERS AND NEED FOR LEG IMMOBILIZER. DR Marlou Sa ACKNOWLEDGED SHE HAD ORDERED AND LOOKING INTO WHY THE ORDERS WERE NOT PLACED OR CARRIED OUT. CALLED [name], PT FOR ANY UPDATES. Reviewed scheduled/upcoming provider appointments including PT REPORTS NEEDING SEVERAL MD APPTS INCLUDING NEPHROLOGY, OPHTHALMOLOGY (DM EVALUATION) DENTIST ([place]) Discussed plans with patient for ongoing care management follow up and provided patient with direct contact information for care management team           SDOH assessments and interventions completed:  Yes Previously assessed.    Care Coordination Interventions Activated:  Yes  Care Coordination Interventions:  Yes, provided   Follow up plan: Follow up call scheduled for 1 week.    Encounter Outcome:  Pt. Visit Completed   Toni Hill C. Myrtie Neither, MSN, [facility] Gerontological Nurse Practitioner [facility] Care Management [phone]

## 2022-05-07 ENCOUNTER — Other Ambulatory Visit: Payer: Self-pay | Admitting: Physical Medicine and Rehabilitation

## 2022-05-10 ENCOUNTER — Other Ambulatory Visit: Payer: Self-pay | Admitting: Internal Medicine

## 2022-05-10 ENCOUNTER — Telehealth: Payer: Self-pay | Admitting: *Deleted

## 2022-05-10 ENCOUNTER — Other Ambulatory Visit: Payer: Self-pay | Admitting: Physical Medicine and Rehabilitation

## 2022-05-10 NOTE — Patient Outreach (Addendum)
Follow up note. NP sent Dr. Marlou Sa secure chat message that pt did not get PT nor the leg immobilizer. She acknowledged my note and said she would look into it. This is to evaluate if we can figure out a way that pt can be transferred into a wheelchair and a van for transportation to medical appts instead of having to use an ambulance service.  Will need to get a diabetic eye exam scheduled as soon as possible also.  Eulah Pont. Myrtie Neither, MSN, [facility] Gerontological Nurse Practitioner [facility] Care Management [phone]

## 2022-05-11 ENCOUNTER — Other Ambulatory Visit: Payer: Self-pay | Admitting: Internal Medicine

## 2022-05-11 DIAGNOSIS — M24561 Contracture, right knee: Secondary | ICD-10-CM

## 2022-05-12 ENCOUNTER — Encounter: Payer: Self-pay | Admitting: *Deleted

## 2022-05-12 ENCOUNTER — Telehealth: Payer: Self-pay | Admitting: *Deleted

## 2022-05-17 ENCOUNTER — Other Ambulatory Visit: Payer: Self-pay | Admitting: Physical Medicine and Rehabilitation

## 2022-05-31 ENCOUNTER — Encounter: Payer: Medicare Other | Admitting: Internal Medicine

## 2022-05-31 ENCOUNTER — Encounter: Payer: Self-pay | Admitting: Internal Medicine

## 2022-05-31 NOTE — Progress Notes (Deleted)
HTN: BP at goal, *. Not currently on medical therapy. Plan:  HYPOTHYROID: Synthroid 137 mcg daily Plan:Check thyroid labs at 3 month follow-up.  T2DM: Current regimen includes novolog 6 units and breakfast and 10 units with lunch and dinner, tresiba 12 units daily. HbA1c today is *. She wears a CGM and review of this data shows:  *  MSK THINGS  CKD3  BP1 ANXIETY: Zoloft 200 mg daily  HLD: Last lipid panel * showed LDL *. Current regimen is zetia, 10 mg daily, simvastatin 40 mg daily. Plan:Check lipid panel at 3 month follow-up.    AE:WYBR to increase [place]. Difficulty with managing lows with insulin regimen at this time. Could start oral agent or once weekly injection.

## 2022-06-18 ENCOUNTER — Ambulatory Visit: Payer: Self-pay | Admitting: Licensed Clinical Social Worker

## 2022-06-18 NOTE — Patient Outreach (Signed)
SW removed from care team.  Johnetta Sloniker, BSW , MSW, LCSW-A Social Worker [facility] Care Management  [phone]   

## 2022-07-02 ENCOUNTER — Telehealth: Payer: Self-pay | Admitting: *Deleted

## 2022-07-02 NOTE — Patient Outreach (Signed)
  Care Coordination   07/02/2022 Name: ELANAH OSMANOVIC MRN: [id number] DOB: 1945/07/28   Care Coordination Outreach Attempts:  An unsuccessful telephone outreach was attempted today to offer the patient information about available care coordination services as a benefit of their health plan.   Follow Up Plan:  Additional outreach attempts will be made to offer the patient care coordination information and services.   Encounter Outcome:  No Answer   Care Coordination Interventions:  No, not indicated     LEFT MESSAGE AND REQUESTED A RETURN CALL. PT SCHEDULED FOR A CALL NEXT WEEK.   SIG Lamorris Knoblock C. Myrtie Neither, MSN, [facility] Gerontological Nurse Practitioner [facility] Care Management [phone]

## 2022-07-06 ENCOUNTER — Telehealth: Payer: Self-pay | Admitting: *Deleted

## 2022-07-06 ENCOUNTER — Encounter: Payer: Self-pay | Admitting: *Deleted

## 2022-07-06 NOTE — Patient Outreach (Signed)
  Care Coordination   07/06/2022 Name: Toni Hill MRN: [id number] DOB: Jan 31, 1946   Care Coordination Outreach Attempts:  A second unsuccessful outreach was attempted today to offer the patient with information about available care coordination services as a benefit of their health plan.     Follow Up Plan:  Additional outreach attempts will be made to offer the patient care coordination information and services.   Encounter Outcome:  No Answer   Care Coordination Interventions:  No, not indicated    SIG Zaiyah Sottile C. Myrtie Neither, MSN, [facility] Gerontological Nurse Practitioner [facility] Care Management [phone]

## 2022-07-08 ENCOUNTER — Telehealth: Payer: Self-pay | Admitting: *Deleted

## 2022-07-08 ENCOUNTER — Encounter: Payer: Self-pay | Admitting: *Deleted

## 2022-07-08 NOTE — Patient Outreach (Signed)
  Care Coordination   07/08/2022 Name: Toni Hill MRN: [id number] DOB: 05-Mar-1946   Care Coordination Outreach Attempts:  A third unsuccessful outreach was attempted today to offer the patient with information about available care coordination services as a benefit of their health plan.   Follow Up Plan:  No further outreach attempts will be made at this time. We have been unable to contact the patient to offer or enroll patient in care coordination services  NP has also called Toni Hill, pt sister with whom she resides and left a message to return call. Advised that if pt does not want to participate to please let NP know her decision.  Encounter Outcome:  No Answer   Care Coordination Interventions:  No, not indicated    SIG Huey Scalia C. Myrtie Neither, MSN, [facility] Gerontological Nurse Practitioner [facility] Care Management [phone]

## 2022-07-14 ENCOUNTER — Other Ambulatory Visit: Payer: Self-pay | Admitting: Internal Medicine

## 2022-07-14 DIAGNOSIS — E785 Hyperlipidemia, unspecified: Secondary | ICD-10-CM

## 2022-07-23 ENCOUNTER — Other Ambulatory Visit: Payer: Self-pay | Admitting: Internal Medicine

## 2022-07-23 ENCOUNTER — Other Ambulatory Visit: Payer: Self-pay | Admitting: Physical Medicine and Rehabilitation

## 2022-07-23 DIAGNOSIS — E785 Hyperlipidemia, unspecified: Secondary | ICD-10-CM

## 2022-07-26 ENCOUNTER — Telehealth: Payer: Self-pay | Admitting: *Deleted

## 2022-07-26 ENCOUNTER — Encounter (HOSPITAL_COMMUNITY): Payer: Self-pay | Admitting: *Deleted

## 2022-07-26 ENCOUNTER — Inpatient Hospital Stay (HOSPITAL_COMMUNITY)
Admission: EM | Admit: 2022-07-26 | Discharge: 2022-07-30 | DRG: 392 | Disposition: A | Discharge status: Home / Self Care | Payer: No Typology Code available for payment source | Attending: Internal Medicine | Admitting: Internal Medicine

## 2022-07-26 ENCOUNTER — Emergency Department (HOSPITAL_COMMUNITY): Payer: No Typology Code available for payment source

## 2022-07-26 ENCOUNTER — Other Ambulatory Visit: Payer: Self-pay

## 2022-07-26 DIAGNOSIS — N1831 Chronic kidney disease, stage 3a: Secondary | ICD-10-CM | POA: Diagnosis present

## 2022-07-26 DIAGNOSIS — I129 Hypertensive chronic kidney disease with stage 1 through stage 4 chronic kidney disease, or unspecified chronic kidney disease: Secondary | ICD-10-CM | POA: Diagnosis present

## 2022-07-26 DIAGNOSIS — I2489 Other forms of acute ischemic heart disease: Secondary | ICD-10-CM | POA: Diagnosis present

## 2022-07-26 DIAGNOSIS — N3281 Overactive bladder: Secondary | ICD-10-CM | POA: Diagnosis present

## 2022-07-26 DIAGNOSIS — K5730 Diverticulosis of large intestine without perforation or abscess without bleeding: Secondary | ICD-10-CM | POA: Diagnosis not present

## 2022-07-26 DIAGNOSIS — Z803 Family history of malignant neoplasm of breast: Secondary | ICD-10-CM

## 2022-07-26 DIAGNOSIS — R000 Tachycardia, unspecified: Secondary | ICD-10-CM | POA: Diagnosis present

## 2022-07-26 DIAGNOSIS — F319 Bipolar disorder, unspecified: Secondary | ICD-10-CM | POA: Diagnosis present

## 2022-07-26 DIAGNOSIS — Z79899 Other long term (current) drug therapy: Secondary | ICD-10-CM

## 2022-07-26 DIAGNOSIS — E1140 Type 2 diabetes mellitus with diabetic neuropathy, unspecified: Secondary | ICD-10-CM

## 2022-07-26 DIAGNOSIS — Z882 Allergy status to sulfonamides status: Secondary | ICD-10-CM

## 2022-07-26 DIAGNOSIS — Z880 Allergy status to penicillin: Secondary | ICD-10-CM

## 2022-07-26 DIAGNOSIS — K529 Noninfective gastroenteritis and colitis, unspecified: Secondary | ICD-10-CM | POA: Diagnosis not present

## 2022-07-26 DIAGNOSIS — Z885 Allergy status to narcotic agent status: Secondary | ICD-10-CM

## 2022-07-26 DIAGNOSIS — E119 Type 2 diabetes mellitus without complications: Secondary | ICD-10-CM

## 2022-07-26 DIAGNOSIS — E785 Hyperlipidemia, unspecified: Secondary | ICD-10-CM | POA: Diagnosis present

## 2022-07-26 DIAGNOSIS — N200 Calculus of kidney: Secondary | ICD-10-CM | POA: Diagnosis present

## 2022-07-26 DIAGNOSIS — K219 Gastro-esophageal reflux disease without esophagitis: Secondary | ICD-10-CM | POA: Diagnosis present

## 2022-07-26 DIAGNOSIS — R509 Fever, unspecified: Secondary | ICD-10-CM | POA: Diagnosis not present

## 2022-07-26 DIAGNOSIS — R29898 Other symptoms and signs involving the musculoskeletal system: Secondary | ICD-10-CM | POA: Diagnosis present

## 2022-07-26 DIAGNOSIS — Z794 Long term (current) use of insulin: Secondary | ICD-10-CM

## 2022-07-26 DIAGNOSIS — E1142 Type 2 diabetes mellitus with diabetic polyneuropathy: Secondary | ICD-10-CM | POA: Diagnosis present

## 2022-07-26 DIAGNOSIS — Z8616 Personal history of COVID-19: Secondary | ICD-10-CM

## 2022-07-26 DIAGNOSIS — E1122 Type 2 diabetes mellitus with diabetic chronic kidney disease: Secondary | ICD-10-CM | POA: Diagnosis present

## 2022-07-26 DIAGNOSIS — K838 Other specified diseases of biliary tract: Secondary | ICD-10-CM | POA: Diagnosis present

## 2022-07-26 DIAGNOSIS — N210 Calculus in bladder: Secondary | ICD-10-CM | POA: Diagnosis not present

## 2022-07-26 DIAGNOSIS — M25562 Pain in left knee: Secondary | ICD-10-CM | POA: Diagnosis present

## 2022-07-26 DIAGNOSIS — Z96652 Presence of left artificial knee joint: Secondary | ICD-10-CM | POA: Diagnosis present

## 2022-07-26 DIAGNOSIS — G8929 Other chronic pain: Secondary | ICD-10-CM | POA: Diagnosis present

## 2022-07-26 DIAGNOSIS — E039 Hypothyroidism, unspecified: Secondary | ICD-10-CM | POA: Diagnosis present

## 2022-07-26 DIAGNOSIS — Z7985 Long-term (current) use of injectable non-insulin antidiabetic drugs: Secondary | ICD-10-CM

## 2022-07-26 DIAGNOSIS — N390 Urinary tract infection, site not specified: Secondary | ICD-10-CM | POA: Diagnosis present

## 2022-07-26 DIAGNOSIS — F419 Anxiety disorder, unspecified: Secondary | ICD-10-CM | POA: Diagnosis present

## 2022-07-26 DIAGNOSIS — G8313 Monoplegia of lower limb affecting right nondominant side: Secondary | ICD-10-CM | POA: Diagnosis present

## 2022-07-26 DIAGNOSIS — Z79890 Hormone replacement therapy (postmenopausal): Secondary | ICD-10-CM

## 2022-07-26 DIAGNOSIS — E860 Dehydration: Secondary | ICD-10-CM | POA: Diagnosis present

## 2022-07-26 DIAGNOSIS — R110 Nausea: Principal | ICD-10-CM

## 2022-07-26 DIAGNOSIS — R1111 Vomiting without nausea: Secondary | ICD-10-CM | POA: Diagnosis not present

## 2022-07-26 DIAGNOSIS — R197 Diarrhea, unspecified: Secondary | ICD-10-CM | POA: Diagnosis not present

## 2022-07-26 DIAGNOSIS — E782 Mixed hyperlipidemia: Secondary | ICD-10-CM | POA: Diagnosis present

## 2022-07-26 DIAGNOSIS — Z91119 Patient's noncompliance with dietary regimen due to unspecified reason: Secondary | ICD-10-CM

## 2022-07-26 DIAGNOSIS — F444 Conversion disorder with motor symptom or deficit: Secondary | ICD-10-CM

## 2022-07-26 DIAGNOSIS — M797 Fibromyalgia: Secondary | ICD-10-CM | POA: Diagnosis present

## 2022-07-26 DIAGNOSIS — Z7401 Bed confinement status: Secondary | ICD-10-CM

## 2022-07-26 DIAGNOSIS — Z888 Allergy status to other drugs, medicaments and biological substances status: Secondary | ICD-10-CM

## 2022-07-26 LAB — I-STAT CHEM 8, ED
BUN: 27 mg/dL — ABNORMAL HIGH (ref 8–23)
Calcium, Ion: 1.21 mmol/L (ref 1.15–1.40)
Chloride: 104 mmol/L (ref 98–111)
Creatinine, Ser: 0.9 mg/dL (ref 0.44–1.00)
Glucose, Bld: 193 mg/dL — ABNORMAL HIGH (ref 70–99)
HCT: 44 % (ref 36.0–46.0)
Hemoglobin: 15 g/dL (ref 12.0–15.0)
Potassium: 4.7 mmol/L (ref 3.5–5.1)
Sodium: 140 mmol/L (ref 135–145)
TCO2: 27 mmol/L (ref 22–32)

## 2022-07-26 LAB — COMPREHENSIVE METABOLIC PANEL
ALT: 16 U/L (ref 0–44)
AST: 27 U/L (ref 15–41)
Albumin: 3.9 g/dL (ref 3.5–5.0)
Alkaline Phosphatase: 65 U/L (ref 38–126)
Anion gap: 13 (ref 5–15)
BUN: 27 mg/dL — ABNORMAL HIGH (ref 8–23)
CO2: 25 mmol/L (ref 22–32)
Calcium: 9.8 mg/dL (ref 8.9–10.3)
Chloride: 101 mmol/L (ref 98–111)
Creatinine, Ser: 1.18 mg/dL — ABNORMAL HIGH (ref 0.44–1.00)
GFR, Estimated: 48 mL/min — ABNORMAL LOW (ref >60)
Glucose, Bld: 201 mg/dL — ABNORMAL HIGH (ref 70–99)
Potassium: 4.7 mmol/L (ref 3.5–5.1)
Sodium: 139 mmol/L (ref 135–145)
Total Bilirubin: 0.8 mg/dL (ref 0.3–1.2)
Total Protein: 7.3 g/dL (ref 6.5–8.1)

## 2022-07-26 LAB — BETA-HYDROXYBUTYRIC ACID: Beta-Hydroxybutyric Acid: 2.02 mmol/L — ABNORMAL HIGH (ref 0.05–0.27)

## 2022-07-26 LAB — BLOOD GAS, VENOUS
Acid-Base Excess: 3.1 mmol/L — ABNORMAL HIGH (ref 0.0–2.0)
Bicarbonate: 30.2 mmol/L — ABNORMAL HIGH (ref 20.0–28.0)
Drawn by: 66297
O2 Saturation: 36 %
Patient temperature: 36.6
pCO2, Ven: 55 mmHg (ref 44–60)
pH, Ven: 7.35 (ref 7.25–7.43)
pO2, Ven: <31 mmHg — CL (ref 32–45)

## 2022-07-26 LAB — CBC WITH DIFFERENTIAL/PLATELET
Abs Immature Granulocytes: 0.05 10*3/uL (ref 0.00–0.07)
Basophils Absolute: 0 10*3/uL (ref 0.0–0.1)
Basophils Relative: 0 %
Eosinophils Absolute: 0 10*3/uL (ref 0.0–0.5)
Eosinophils Relative: 0 %
HCT: 44.3 % (ref 36.0–46.0)
Hemoglobin: 14 g/dL (ref 12.0–15.0)
Immature Granulocytes: 1 %
Lymphocytes Relative: 11 %
Lymphs Abs: 1 10*3/uL (ref 0.7–4.0)
MCH: 30.9 pg (ref 26.0–34.0)
MCHC: 31.6 g/dL (ref 30.0–36.0)
MCV: 97.8 fL (ref 80.0–100.0)
Monocytes Absolute: 0.4 10*3/uL (ref 0.1–1.0)
Monocytes Relative: 4 %
Neutro Abs: 7.9 10*3/uL — ABNORMAL HIGH (ref 1.7–7.7)
Neutrophils Relative %: 84 %
Platelets: 244 10*3/uL (ref 150–400)
RBC: 4.53 MIL/uL (ref 3.87–5.11)
RDW: 12.6 % (ref 11.5–15.5)
WBC: 9.4 10*3/uL (ref 4.0–10.5)
nRBC: 0 % (ref 0.0–0.2)

## 2022-07-26 LAB — CBG MONITORING, ED
Glucose-Capillary: 169 mg/dL — ABNORMAL HIGH (ref 70–99)
Glucose-Capillary: 187 mg/dL — ABNORMAL HIGH (ref 70–99)

## 2022-07-26 LAB — LIPASE, BLOOD: Lipase: 25 U/L (ref 11–51)

## 2022-07-26 LAB — MAGNESIUM: Magnesium: 2 mg/dL (ref 1.7–2.4)

## 2022-07-26 LAB — LACTIC ACID, PLASMA: Lactic Acid, Venous: 1.2 mmol/L (ref 0.5–1.9)

## 2022-07-26 MED ORDER — FENTANYL CITRATE PF 50 MCG/ML IJ SOSY
25.0000 ug | PREFILLED_SYRINGE | Freq: Once | INTRAMUSCULAR | Status: AC
  Administered 2022-07-26: 25 ug via INTRAVENOUS
  Filled 2022-07-26: qty 1

## 2022-07-26 MED ORDER — LACTATED RINGERS IV BOLUS
1000.0000 mL | Freq: Once | INTRAVENOUS | Status: AC
  Administered 2022-07-26: 1000 mL via INTRAVENOUS

## 2022-07-26 MED ORDER — FENTANYL CITRATE PF 50 MCG/ML IJ SOSY
50.0000 ug | PREFILLED_SYRINGE | Freq: Once | INTRAMUSCULAR | Status: AC
  Administered 2022-07-26: 50 ug via INTRAVENOUS
  Filled 2022-07-26: qty 1

## 2022-07-26 MED ORDER — IOHEXOL 300 MG/ML  SOLN
80.0000 mL | Freq: Once | INTRAMUSCULAR | Status: AC | PRN
  Administered 2022-07-26: 80 mL via INTRAVENOUS

## 2022-07-26 MED ORDER — METOCLOPRAMIDE HCL 5 MG/ML IJ SOLN
10.0000 mg | Freq: Once | INTRAMUSCULAR | Status: AC
  Administered 2022-07-26: 10 mg via INTRAVENOUS
  Filled 2022-07-26: qty 2

## 2022-07-26 MED ORDER — ONDANSETRON HCL 4 MG/2ML IJ SOLN
4.0000 mg | Freq: Once | INTRAMUSCULAR | Status: AC
  Administered 2022-07-26: 4 mg via INTRAVENOUS
  Filled 2022-07-26: qty 2

## 2022-07-26 NOTE — ED Notes (Signed)
Pt returned from CT Scan 

## 2022-07-26 NOTE — ED Triage Notes (Signed)
Pt brought in by [facility] for c/o nausea and diarrhea x 2 days; pt c/o central abdominal pain  Pt given IV zofran '4mg'$  en route  Cbg 191   20G R AC  120/84 BP with EMS

## 2022-07-26 NOTE — Telephone Encounter (Signed)
Called patient unable to leave voice message (full), this is in regards to find out who she had chosen for [facility] with.

## 2022-07-26 NOTE — ED Notes (Signed)
Patient transported to CT 

## 2022-07-26 NOTE — ED Notes (Signed)
Pt requested large bedpan at this time.

## 2022-07-26 NOTE — ED Provider Notes (Signed)
[facility] Provider Note   CSN: [id number] Arrival date & time: 07/26/22  1452     History  Chief Complaint  Patient presents with   Nausea    Toni Hill is a 77 y.o. female.  HPI Patient presents for abdominal pain, nausea, vomiting.  Medical history includes T2DM, HTN, HLD, CKD, hypothyroidism, anxiety, bipolar disorder, fibromyalgia.  Over the past 3 days, she has had periumbilical abdominal pain, diarrhea, nausea.  She has not been able to eat or drink.  She is diabetic and takes insulin but has not checked her sugar or taken insulin over this past several days.  She did have 1 episode of vomiting prior to arrival.  She has not had any diarrhea today.  Diarrhea is described as brown in color.  She was given 4 mg of Zofran prior to arrival.  Currently, she endorses ongoing abdominal pain and ongoing nausea.    Home Medications Prior to Admission medications   Medication Sig Start Date End Date Taking? Authorizing Provider  acetaminophen (TYLENOL) 500 MG tablet Take 1,000 mg by mouth in the morning and at bedtime.   Yes [provider]  Calcium Carb-Cholecalciferol (CALCIUM 600/VITAMIN D3) 600-800 MG-UNIT TABS Take 1 tablet by mouth daily.    Yes [provider]  docusate sodium (COLACE) 100 MG capsule Take 100 mg by mouth daily.   Yes [provider]  ezetimibe (ZETIA) 10 MG tablet TAKE 1 TABLET BY MOUTH EVERY DAY IN THE EVENING 07/14/22  Yes Farrel Gordon, DO  insulin aspart (NOVOLOG) 100 UNIT/ML injection Inject 6 Units into the skin daily with breakfast AND 10 Units daily with lunch AND 10 Units daily with supper. 02/10/22  Yes Farrel Gordon, DO  insulin degludec (TRESIBA FLEXTOUCH) 100 UNIT/ML FlexTouch Pen Inject 12 Units into the skin daily. 02/10/22  Yes Farrel Gordon, DO  pantoprazole (PROTONIX) 40 MG tablet TAKE 1 TABLET BY MOUTH EVERY DAY Patient taking differently: Take 40 mg by mouth daily. 07/14/22  Yes  Farrel Gordon, DO  pregabalin (LYRICA) 150 MG capsule TAKE 1 CAPSULE BY MOUTH TWO TIMES A DAY 03/10/22  Yes Farrel Gordon, DO  sertraline (ZOLOFT) 100 MG tablet TAKE 2 TABLETS BY MOUTH EVERY DAY 05/04/22  Yes Farrel Gordon, DO  simvastatin (ZOCOR) 40 MG tablet TAKE 1 TABLET BY MOUTH EVERY DAY 07/26/22  Yes Farrel Gordon, DO  SYNTHROID 137 MCG tablet TAKE 1 TABLET (137 MCG TOTAL) BY MOUTH DAILY BEFORE BREAKFAST. BRAND NAME ONLY Patient taking differently: Take 137 mcg by mouth daily before breakfast. Name  Brand 07/26/22  Yes Farrel Gordon, DO  tiZANidine (ZANAFLEX) 4 MG tablet TAKE 1 TABLET (4 MG TOTAL) BY MOUTH IN THE MORNING AND AT BEDTIME. Patient taking differently: Take 4 mg by mouth See admin instructions. Take 1 tablet in the morning and 1 tablet at bedtime 04/09/22  Yes Farrel Gordon, DO  VITAMIN E PO Take 400 Units by mouth 2 (two) times daily.   Yes [provider]  Accu-Chek Softclix Lancets lancets Use to check blood sugar up to 4 times per day. 03/05/22   Farrel Gordon, DO  Blood Glucose Monitoring Suppl (ACCU-CHEK GUIDE) w/Device KIT Check blood sugar 1 times per day. 02/10/22   Farrel Gordon, DO  diclofenac Sodium (VOLTAREN) 1 % GEL APPLY 1 APPLICATION TOPICALLY 4 TIMES A DAY Patient taking differently: Apply 2 g topically 4 (four) times daily. 05/10/22   Farrel Gordon, DO  glucose blood (ACCU-CHEK GUIDE) test  strip Check blood sugar up to 4 times per day 03/05/22   Farrel Gordon, DO  Insulin Pen Needle (B-D ULTRAFINE III SHORT PEN) 31G X 8 MM MISC 1 each by Does not apply route daily. Diagnosis code: E11.9. Used to inject insulin daily. 12/03/21   Maudie Mercury, MD  ketoconazole (NIZORAL) 2 % cream Apply 1 Application topically daily. 02/10/22   Farrel Gordon, DO  lidocaine (LIDODERM) 5 % PLACE 1 PATCH ONTO THE SKIN DAILY. REMOVE & DISCARD PATCH WITHIN 12 HOURS OR AS DIRECTED BY MD Patient not taking: Reported on 02/19/2022 01/13/22   Farrel Gordon, DO  OneTouch Delica Lancets 02I MISC Check blood sugar 3 times a  day 03/14/20   Maudie Mercury, MD      Allergies    Bactrim [sulfamethoxazole-trimethoprim], Cephalexin, Gabapentin, Morphine and related, Vioxx [rofecoxib], and Penicillins    Review of Systems   Review of Systems  Constitutional:  Positive for fatigue.  Gastrointestinal:  Positive for abdominal pain, diarrhea, nausea and vomiting.  Endocrine: Positive for polyuria.  All other systems reviewed and are negative.   Physical Exam Updated Vital Signs BP 132/72   Pulse (!) 116   Temp 98.1 F (36.7 C)   Resp 17   Ht '5\' 9"'$  (1.753 m)   Wt 72.6 kg   SpO2 96%   BMI 23.63 kg/m  Physical Exam Vitals and nursing note reviewed.  Constitutional:      General: She is not in acute distress.    Appearance: Normal appearance. She is well-developed. She is not ill-appearing, toxic-appearing or diaphoretic.  HENT:     Head: Normocephalic and atraumatic.     Right Ear: External ear normal.     Left Ear: External ear normal.     Nose: Nose normal.     Mouth/Throat:     Mouth: Mucous membranes are moist.  Eyes:     Extraocular Movements: Extraocular movements intact.     Conjunctiva/sclera: Conjunctivae normal.  Cardiovascular:     Rate and Rhythm: Normal rate and regular rhythm.     Heart sounds: No murmur heard. Pulmonary:     Effort: Pulmonary effort is normal. No respiratory distress.     Breath sounds: Normal breath sounds. No wheezing or rales.  Chest:     Chest wall: No tenderness.  Abdominal:     General: There is no distension.     Palpations: Abdomen is soft.     Tenderness: There is abdominal tenderness. There is no guarding or rebound.  Musculoskeletal:        General: No swelling.     Cervical back: Normal range of motion and neck supple.     Right lower leg: No edema.     Left lower leg: No edema.     Comments: Chronic contracture of distal right lower extremity  Skin:    General: Skin is warm and dry.     Capillary Refill: Capillary refill takes less than 2  seconds.     Coloration: Skin is not jaundiced or pale.  Neurological:     General: No focal deficit present.     Mental Status: She is alert and oriented to person, place, and time. Mental status is at baseline.     Cranial Nerves: No cranial nerve deficit.     Sensory: No sensory deficit.  Psychiatric:        Mood and Affect: Mood normal.        Behavior: Behavior normal.  Thought Content: Thought content normal.        Judgment: Judgment normal.     ED Results / Procedures / Treatments   Labs (all labs ordered are listed, but only abnormal results are displayed) Labs Reviewed  COMPREHENSIVE METABOLIC PANEL - Abnormal; Notable for the following components:      Result Value   Glucose, Bld 201 (*)    BUN 27 (*)    Creatinine, Ser 1.18 (*)    GFR, Estimated 48 (*)    All other components within normal limits  CBC WITH DIFFERENTIAL/PLATELET - Abnormal; Notable for the following components:   Neutro Abs 7.9 (*)    All other components within normal limits  URINALYSIS, ROUTINE W REFLEX MICROSCOPIC - Abnormal; Notable for the following components:   APPearance CLOUDY (*)    Specific Gravity, Urine 1.040 (*)    Glucose, UA 50 (*)    Hgb urine dipstick SMALL (*)    Ketones, ur 20 (*)    Protein, ur 30 (*)    Leukocytes,Ua LARGE (*)    WBC, UA >50 (*)    Bacteria, UA MANY (*)    All other components within normal limits  BLOOD GAS, VENOUS - Abnormal; Notable for the following components:   pO2, Ven <31 (*)    Bicarbonate 30.2 (*)    Acid-Base Excess 3.1 (*)    All other components within normal limits  BETA-HYDROXYBUTYRIC ACID - Abnormal; Notable for the following components:   Beta-Hydroxybutyric Acid 2.02 (*)    All other components within normal limits  I-STAT CHEM 8, ED - Abnormal; Notable for the following components:   BUN 27 (*)    Glucose, Bld 193 (*)    All other components within normal limits  CBG MONITORING, ED - Abnormal; Notable for the following  components:   Glucose-Capillary 187 (*)    All other components within normal limits  CBG MONITORING, ED - Abnormal; Notable for the following components:   Glucose-Capillary 169 (*)    All other components within normal limits  CBG MONITORING, ED - Abnormal; Notable for the following components:   Glucose-Capillary 166 (*)    All other components within normal limits  TROPONIN I (HIGH SENSITIVITY) - Abnormal; Notable for the following components:   Troponin I (High Sensitivity) 38 (*)    All other components within normal limits  URINE CULTURE  LIPASE, BLOOD  LACTIC ACID, PLASMA  MAGNESIUM  COMPREHENSIVE METABOLIC PANEL  MAGNESIUM  TSH    EKG EKG Interpretation  Date/Time:  Monday July 26 2022 21:52:18 EST Ventricular Rate:  124 PR Interval:  68 QRS Duration: 140 QT Interval:  348 QTC Calculation: 500 R Axis:   115 Text Interpretation: Sinus or ectopic atrial tachycardia RBBB and LPFB Borderline ST depression, lateral leads Confirmed by Godfrey Pick [phone]) on 07/26/2022 11:14:41 PM  Radiology DG Chest Portable 1 View  Result Date: 07/27/2022 CLINICAL DATA:  Nausea and abdominal pain. EXAM: PORTABLE CHEST 1 VIEW COMPARISON:  July 02, 2019 FINDINGS: The heart size and mediastinal contours are within normal limits. There is marked severity calcification of the aortic arch. Low lung volumes are noted. Mild linear atelectasis is seen within the perihilar region on the right. There is mild, stable elevation of the right hemidiaphragm. There is no evidence of a pleural effusion or pneumothorax. Radiopaque surgical clips are seen within the right upper quadrant. A chronic fracture deformity is seen involving the surgical neck of the proximal left humerus. Multilevel degenerative changes  are seen throughout the thoracic spine. IMPRESSION: Low lung volumes with mild right perihilar linear atelectasis. Electronically Signed   By: Virgina Norfolk M.D.   On: 07/27/2022 01:15   CT  ABDOMEN PELVIS W CONTRAST  Result Date: 07/26/2022 CLINICAL DATA:  Acute nonlocalized abdominal pain EXAM: CT ABDOMEN AND PELVIS WITH CONTRAST TECHNIQUE: Multidetector CT imaging of the abdomen and pelvis was performed using the standard protocol following bolus administration of intravenous contrast. RADIATION DOSE REDUCTION: This exam was performed according to the departmental dose-optimization program which includes automated exposure control, adjustment of the mA and/or kV according to patient size and/or use of iterative reconstruction technique. CONTRAST:  71m OMNIPAQUE IOHEXOL 300 MG/ML SOLN IV. No oral contrast. COMPARISON:  12/17/2020 FINDINGS: Lower chest: Subsegmental atelectasis BILATERAL lower lobes. Hepatobiliary: Post cholecystectomy. Mild intrahepatic and extrahepatic biliary dilatation slightly increased from previous exam. No focal hepatic mass. Pancreas: Atrophic pancreas without mass Spleen: Mass normal appearance Adrenals/Urinary Tract: RIGHT adrenal gland normal. LEFT adrenal mass 2.0 x 1.7 cm, measuring 49 HU; on prior noncontrast exam this measured 0 HU consistent with lipid rich adenoma; no follow-up imaging recommended. Numerous BILATERAL renal cysts low to intermediate attenuation, largest lesion anterior RIGHT kidney 4.7 x 3.9 cm image 36 previously 6.3 x 5.7 cm. These are increased in size and number since 11/20/2016. Scattered nonobstructing renal calculi. Tiny calculus within urinary bladder dependently. No hydronephrosis or hydroureter. No ureteral calcifications. Bladder otherwise unremarkable. Stomach/Bowel: Colonic diverticulosis primarily at sigmoid colon. No CT evidence of diverticulitis. Normal appendix. Stomach and bowel loops otherwise normal appearance. Vascular/Lymphatic: Atherosclerotic calcifications aorta and iliac arteries without aneurysm. Scattered pelvic phleboliths. No adenopathy. Reproductive: Uterus surgically absent.  Normal sized ovaries. Other: No free air  or free fluid. No hernia. Linear high attenuation focus in the anterior mid abdomen supraumbilical appears unchanged question sequela of patient's reported prior ventral hernia repair. No inflammatory process. Musculoskeletal: Osseous demineralization. Probable vertebral hemangioma L4 unchanged. IMPRESSION: Colonic diverticulosis without evidence of diverticulitis. Multiple BILATERAL nonobstructing renal calculi. Tiny calculus within urinary bladder dependently. Numerous BILATERAL renal cysts low to intermediate attenuation, increased in size and number since 11/20/2016; follow-up characterization by non emergent renal protocol MRI or CT with and without contrast recommended to assess indeterminate lesions. Post cholecystectomy with mild intrahepatic and extrahepatic biliary dilatation, slightly increased from previous exam, recommend correlation with LFTs. Stable LEFT adrenal adenoma. No acute intra-abdominal or intrapelvic abnormalities. Aortic Atherosclerosis (ICD10-I70.0). Electronically Signed   By: MLavonia DanaM.D.   On: 07/26/2022 20:10    Procedures Procedures    Medications Ordered in ED Medications  ezetimibe (ZETIA) tablet 10 mg (has no administration in time range)  simvastatin (ZOCOR) tablet 40 mg (has no administration in time range)  sertraline (ZOLOFT) tablet 200 mg (has no administration in time range)  pantoprazole (PROTONIX) EC tablet 40 mg (has no administration in time range)  pregabalin (LYRICA) capsule 150 mg (has no administration in time range)  tiZANidine (ZANAFLEX) tablet 4 mg (has no administration in time range)  heparin injection 5,000 Units (has no administration in time range)  0.9 %  sodium chloride infusion ( Intravenous New Bag/Given 07/27/22 0204)  acetaminophen (TYLENOL) tablet 650 mg (has no administration in time range)    Or  acetaminophen (TYLENOL) suppository 650 mg (has no administration in time range)  oxyCODONE (Oxy IR/ROXICODONE) immediate release  tablet 5 mg (has no administration in time range)  ondansetron (ZOFRAN) tablet 4 mg (has no administration in time range)    Or  ondansetron (ZOFRAN) injection 4 mg (has no administration in time range)  insulin aspart (novoLOG) injection 0-15 Units (has no administration in time range)  cefTRIAXone (ROCEPHIN) 1 g in sodium chloride 0.9 % 100 mL IVPB (has no administration in time range)  fentaNYL (SUBLIMAZE) injection 25 mcg (25 mcg Intravenous Given 07/26/22 1649)  metoCLOPramide (REGLAN) injection 10 mg (10 mg Intravenous Given 07/26/22 1648)  lactated ringers bolus 1,000 mL (0 mLs Intravenous Stopped 07/26/22 1803)  lactated ringers bolus 1,000 mL (0 mLs Intravenous Stopped 07/26/22 2015)  iohexol (OMNIPAQUE) 300 MG/ML solution 80 mL (80 mLs Intravenous Contrast Given 07/26/22 1922)  fentaNYL (SUBLIMAZE) injection 50 mcg (50 mcg Intravenous Given 07/26/22 2327)  ondansetron (ZOFRAN) injection 4 mg (4 mg Intravenous Given 07/26/22 2327)  cefTRIAXone (ROCEPHIN) 2 g in sodium chloride 0.9 % 100 mL IVPB (0 g Intravenous Stopped 07/27/22 0153)    ED Course/ Medical Decision Making/ A&P                             Medical Decision Making Amount and/or Complexity of Data Reviewed Labs: ordered. Radiology: ordered.  Risk Prescription drug management. Decision regarding hospitalization.   This patient presents to the ED for concern of abdominal pain, this involves an extensive number of treatment options, and is a complaint that carries with it a high risk of complications and morbidity.  The differential diagnosis includes enteritis, SBO, neoplasm, dehydration, colitis, diverticulitis, appendicitis, pyelonephritis   Co morbidities that complicate the patient evaluation  T2DM, HTN, HLD, CKD, hypothyroidism, anxiety, bipolar disorder, fibromyalgia   Additional history obtained:  Additional history obtained from patient's family member External records from outside source obtained and  reviewed including EMR   Lab Tests:  I Ordered, and personally interpreted labs.  The pertinent results include: Normal hemoglobin, no leukocytosis, baseline creatinine, normal electrolytes, normal bicarb and anion gap.  Mild ketonuria, large pyuria and bacteriuria.   Imaging Studies ordered:  I ordered imaging studies including CT of abdomen pelvis, chest x-ray I independently visualized and interpreted imaging which showed bilateral nonobstructing renal calculi, tiny calculus and urinary bladder, bilateral renal cysts that are increased in size from 5 years ago, mild intrapelvic and extrahepatic biliary dilatation I agree with the radiologist interpretation   Cardiac Monitoring: / EKG:  The patient was maintained on a cardiac monitor.  I personally viewed and interpreted the cardiac monitored which showed an underlying rhythm of: Sinus rhythm   Problem List / ED Course / Critical interventions / Medication management  Patient presents for 3 days of abdominal pain, nausea, diarrhea.  On arrival in the ED, she is tachycardic in the range of 120.  Blood pressure is moderately elevated.  She is well-appearing on exam.  Her abdomen is soft but she does endorse tenderness that is maximal in the periumbilical area.  Given her poor p.o. intake over the past 3 days, IV fluid was ordered.  Fentanyl and Reglan were ordered for symptomatic relief.  Laboratory workup was initiated.  Lab findings are noted as above.  On CT imaging of abdomen pelvis, there were no acute findings to explain her symptoms.  She does have a tiny calculus and urinary bladder which might suggest recent passage of a painful stone.  She also has mild biliary dilatation.  On CMP, hepatobiliary enzymes are normal.  Patient's urinalysis consistent with UTI.  Ceftriaxone was ordered.  While in the ED, patient had ongoing nausea and pain.  Additional  fentanyl and Zofran were ordered.  She remained persistently tachycardic despite 2 L of  IV fluid and pain medication.  She was admitted to hospitalist for further management. I ordered medication including IV fluids for hydration; Reglan Zofran for nausea; fentanyl for analgesia; ceftriaxone for UTI Reevaluation of the patient after these medicines showed that the patient improved I have reviewed the patients home medicines and have made adjustments as needed   Social Determinants of Health:  Has PCP        Final Clinical Impression(s) / ED Diagnoses Final diagnoses:  Nausea  Dehydration  Urinary tract infection without hematuria, site unspecified    Rx / DC Orders ED Discharge Orders     None         Godfrey Pick, MD 07/27/22 [phone]

## 2022-07-27 ENCOUNTER — Emergency Department (HOSPITAL_COMMUNITY): Payer: No Typology Code available for payment source

## 2022-07-27 ENCOUNTER — Observation Stay (HOSPITAL_COMMUNITY): Payer: No Typology Code available for payment source

## 2022-07-27 DIAGNOSIS — E860 Dehydration: Secondary | ICD-10-CM | POA: Diagnosis present

## 2022-07-27 DIAGNOSIS — K219 Gastro-esophageal reflux disease without esophagitis: Secondary | ICD-10-CM | POA: Insufficient documentation

## 2022-07-27 DIAGNOSIS — E1140 Type 2 diabetes mellitus with diabetic neuropathy, unspecified: Secondary | ICD-10-CM | POA: Diagnosis not present

## 2022-07-27 DIAGNOSIS — R5381 Other malaise: Secondary | ICD-10-CM | POA: Diagnosis not present

## 2022-07-27 DIAGNOSIS — R109 Unspecified abdominal pain: Secondary | ICD-10-CM | POA: Diagnosis not present

## 2022-07-27 DIAGNOSIS — R52 Pain, unspecified: Secondary | ICD-10-CM | POA: Diagnosis not present

## 2022-07-27 DIAGNOSIS — R531 Weakness: Secondary | ICD-10-CM | POA: Diagnosis not present

## 2022-07-27 DIAGNOSIS — N3281 Overactive bladder: Secondary | ICD-10-CM | POA: Diagnosis not present

## 2022-07-27 DIAGNOSIS — I2489 Other forms of acute ischemic heart disease: Secondary | ICD-10-CM | POA: Diagnosis not present

## 2022-07-27 DIAGNOSIS — Z882 Allergy status to sulfonamides status: Secondary | ICD-10-CM | POA: Diagnosis not present

## 2022-07-27 DIAGNOSIS — N390 Urinary tract infection, site not specified: Secondary | ICD-10-CM

## 2022-07-27 DIAGNOSIS — Z794 Long term (current) use of insulin: Secondary | ICD-10-CM

## 2022-07-27 DIAGNOSIS — F319 Bipolar disorder, unspecified: Secondary | ICD-10-CM | POA: Diagnosis not present

## 2022-07-27 DIAGNOSIS — E785 Hyperlipidemia, unspecified: Secondary | ICD-10-CM

## 2022-07-27 DIAGNOSIS — K838 Other specified diseases of biliary tract: Secondary | ICD-10-CM | POA: Insufficient documentation

## 2022-07-27 DIAGNOSIS — J9811 Atelectasis: Secondary | ICD-10-CM | POA: Diagnosis not present

## 2022-07-27 DIAGNOSIS — Z96652 Presence of left artificial knee joint: Secondary | ICD-10-CM | POA: Diagnosis not present

## 2022-07-27 DIAGNOSIS — R000 Tachycardia, unspecified: Secondary | ICD-10-CM | POA: Diagnosis not present

## 2022-07-27 DIAGNOSIS — E039 Hypothyroidism, unspecified: Secondary | ICD-10-CM | POA: Diagnosis not present

## 2022-07-27 DIAGNOSIS — Z8616 Personal history of COVID-19: Secondary | ICD-10-CM | POA: Diagnosis not present

## 2022-07-27 DIAGNOSIS — G8929 Other chronic pain: Secondary | ICD-10-CM | POA: Diagnosis not present

## 2022-07-27 DIAGNOSIS — R197 Diarrhea, unspecified: Secondary | ICD-10-CM | POA: Diagnosis not present

## 2022-07-27 DIAGNOSIS — Z471 Aftercare following joint replacement surgery: Secondary | ICD-10-CM | POA: Diagnosis not present

## 2022-07-27 DIAGNOSIS — R079 Chest pain, unspecified: Secondary | ICD-10-CM | POA: Diagnosis not present

## 2022-07-27 DIAGNOSIS — I129 Hypertensive chronic kidney disease with stage 1 through stage 4 chronic kidney disease, or unspecified chronic kidney disease: Secondary | ICD-10-CM | POA: Diagnosis not present

## 2022-07-27 DIAGNOSIS — E1142 Type 2 diabetes mellitus with diabetic polyneuropathy: Secondary | ICD-10-CM | POA: Diagnosis not present

## 2022-07-27 DIAGNOSIS — Z743 Need for continuous supervision: Secondary | ICD-10-CM | POA: Diagnosis not present

## 2022-07-27 DIAGNOSIS — N1831 Chronic kidney disease, stage 3a: Secondary | ICD-10-CM | POA: Diagnosis not present

## 2022-07-27 DIAGNOSIS — Z880 Allergy status to penicillin: Secondary | ICD-10-CM | POA: Diagnosis not present

## 2022-07-27 DIAGNOSIS — Z888 Allergy status to other drugs, medicaments and biological substances status: Secondary | ICD-10-CM | POA: Diagnosis not present

## 2022-07-27 DIAGNOSIS — Z885 Allergy status to narcotic agent status: Secondary | ICD-10-CM | POA: Diagnosis not present

## 2022-07-27 DIAGNOSIS — K529 Noninfective gastroenteritis and colitis, unspecified: Secondary | ICD-10-CM | POA: Diagnosis not present

## 2022-07-27 DIAGNOSIS — N200 Calculus of kidney: Secondary | ICD-10-CM | POA: Diagnosis not present

## 2022-07-27 DIAGNOSIS — M25562 Pain in left knee: Secondary | ICD-10-CM | POA: Diagnosis not present

## 2022-07-27 DIAGNOSIS — Z7401 Bed confinement status: Secondary | ICD-10-CM | POA: Diagnosis not present

## 2022-07-27 DIAGNOSIS — M797 Fibromyalgia: Secondary | ICD-10-CM | POA: Diagnosis not present

## 2022-07-27 DIAGNOSIS — R110 Nausea: Secondary | ICD-10-CM | POA: Diagnosis not present

## 2022-07-27 DIAGNOSIS — E1122 Type 2 diabetes mellitus with diabetic chronic kidney disease: Secondary | ICD-10-CM | POA: Diagnosis not present

## 2022-07-27 LAB — URINALYSIS, ROUTINE W REFLEX MICROSCOPIC
Bilirubin Urine: NEGATIVE
Glucose, UA: 50 mg/dL — AB
Ketones, ur: 20 mg/dL — AB
Nitrite: NEGATIVE
Protein, ur: 30 mg/dL — AB
Specific Gravity, Urine: 1.04 — ABNORMAL HIGH (ref 1.005–1.030)
WBC, UA: >50 WBC/hpf — ABNORMAL HIGH (ref 0–5)
pH: 5 (ref 5.0–8.0)

## 2022-07-27 LAB — COMPREHENSIVE METABOLIC PANEL
ALT: 13 U/L (ref 0–44)
AST: 22 U/L (ref 15–41)
Albumin: 3 g/dL — ABNORMAL LOW (ref 3.5–5.0)
Alkaline Phosphatase: 49 U/L (ref 38–126)
Anion gap: 7 (ref 5–15)
BUN: 24 mg/dL — ABNORMAL HIGH (ref 8–23)
CO2: 25 mmol/L (ref 22–32)
Calcium: 8.8 mg/dL — ABNORMAL LOW (ref 8.9–10.3)
Chloride: 107 mmol/L (ref 98–111)
Creatinine, Ser: 0.99 mg/dL (ref 0.44–1.00)
GFR, Estimated: 59 mL/min — ABNORMAL LOW (ref >60)
Glucose, Bld: 108 mg/dL — ABNORMAL HIGH (ref 70–99)
Potassium: 4 mmol/L (ref 3.5–5.1)
Sodium: 139 mmol/L (ref 135–145)
Total Bilirubin: 0.5 mg/dL (ref 0.3–1.2)
Total Protein: 5.7 g/dL — ABNORMAL LOW (ref 6.5–8.1)

## 2022-07-27 LAB — CBG MONITORING, ED
Glucose-Capillary: 166 mg/dL — ABNORMAL HIGH (ref 70–99)
Glucose-Capillary: 109 mg/dL — ABNORMAL HIGH (ref 70–99)
Glucose-Capillary: 80 mg/dL (ref 70–99)

## 2022-07-27 LAB — TSH: TSH: 0.23 u[IU]/mL — ABNORMAL LOW (ref 0.350–4.500)

## 2022-07-27 LAB — TROPONIN I (HIGH SENSITIVITY)
Troponin I (High Sensitivity): 38 ng/L — ABNORMAL HIGH (ref <18)
Troponin I (High Sensitivity): 41 ng/L — ABNORMAL HIGH (ref <18)

## 2022-07-27 LAB — GLUCOSE, CAPILLARY: Glucose-Capillary: 121 mg/dL — ABNORMAL HIGH (ref 70–99)

## 2022-07-27 LAB — MAGNESIUM: Magnesium: 1.7 mg/dL (ref 1.7–2.4)

## 2022-07-27 MED ORDER — SIMVASTATIN 20 MG PO TABS
40.0000 mg | ORAL_TABLET | Freq: Every day | ORAL | Status: DC
  Administered 2022-07-27 – 2022-07-29 (×3): 40 mg via ORAL
  Filled 2022-07-27 (×3): qty 2

## 2022-07-27 MED ORDER — ACETAMINOPHEN 650 MG RE SUPP: 650.0000 mg | Freq: Four times a day (QID) | RECTAL | Status: DC | PRN

## 2022-07-27 MED ORDER — SERTRALINE HCL 50 MG PO TABS
200.0000 mg | ORAL_TABLET | Freq: Every day | ORAL | Status: DC
  Administered 2022-07-27 – 2022-07-29 (×3): 200 mg via ORAL
  Filled 2022-07-27 (×3): qty 4

## 2022-07-27 MED ORDER — SODIUM CHLORIDE 0.9 % IV SOLN
1.0000 g | INTRAVENOUS | Status: DC
  Administered 2022-07-27 – 2022-07-28 (×2): 1 g via INTRAVENOUS
  Filled 2022-07-27 (×2): qty 10

## 2022-07-27 MED ORDER — PANTOPRAZOLE SODIUM 40 MG PO TBEC
40.0000 mg | DELAYED_RELEASE_TABLET | Freq: Every day | ORAL | Status: DC
  Administered 2022-07-27 – 2022-07-29 (×3): 40 mg via ORAL
  Filled 2022-07-27 (×3): qty 1

## 2022-07-27 MED ORDER — SODIUM CHLORIDE 0.9 % IV SOLN
2.0000 g | Freq: Once | INTRAVENOUS | Status: AC
  Administered 2022-07-27: 2 g via INTRAVENOUS
  Filled 2022-07-27: qty 20

## 2022-07-27 MED ORDER — ONDANSETRON HCL 4 MG PO TABS: 4.0000 mg | ORAL_TABLET | Freq: Four times a day (QID) | ORAL | Status: DC | PRN

## 2022-07-27 MED ORDER — INSULIN ASPART 100 UNIT/ML IJ SOLN: 0.0000 [IU] | Freq: Three times a day (TID) | INTRAMUSCULAR | Status: DC

## 2022-07-27 MED ORDER — DICLOFENAC SODIUM 1 % EX GEL
2.0000 g | Freq: Four times a day (QID) | CUTANEOUS | Status: DC
  Administered 2022-07-27 – 2022-07-29 (×8): 2 g via TOPICAL
  Filled 2022-07-27 (×2): qty 100

## 2022-07-27 MED ORDER — HEPARIN SODIUM (PORCINE) 5000 UNIT/ML IJ SOLN
5000.0000 [IU] | Freq: Three times a day (TID) | INTRAMUSCULAR | Status: DC
  Administered 2022-07-27 – 2022-07-29 (×8): 5000 [IU] via SUBCUTANEOUS
  Filled 2022-07-27 (×8): qty 1

## 2022-07-27 MED ORDER — SODIUM CHLORIDE 0.9 % IV SOLN: 1.0000 g | INTRAVENOUS | Status: DC

## 2022-07-27 MED ORDER — ONDANSETRON HCL 4 MG/2ML IJ SOLN: 4.0000 mg | Freq: Four times a day (QID) | INTRAMUSCULAR | Status: DC | PRN

## 2022-07-27 MED ORDER — INSULIN ASPART 100 UNIT/ML IJ SOLN
0.0000 [IU] | INTRAMUSCULAR | Status: DC
  Administered 2022-07-27: 3 [IU] via SUBCUTANEOUS
  Filled 2022-07-27: qty 1

## 2022-07-27 MED ORDER — SODIUM CHLORIDE 0.9 % IV SOLN: INTRAVENOUS | Status: DC

## 2022-07-27 MED ORDER — ACETAMINOPHEN 325 MG PO TABS
650.0000 mg | ORAL_TABLET | Freq: Four times a day (QID) | ORAL | Status: DC | PRN
  Administered 2022-07-29: 650 mg via ORAL
  Filled 2022-07-27: qty 2

## 2022-07-27 MED ORDER — PREGABALIN 75 MG PO CAPS
150.0000 mg | ORAL_CAPSULE | Freq: Two times a day (BID) | ORAL | Status: DC
  Administered 2022-07-27 – 2022-07-29 (×7): 150 mg via ORAL
  Filled 2022-07-27 (×7): qty 2

## 2022-07-27 MED ORDER — EZETIMIBE 10 MG PO TABS
10.0000 mg | ORAL_TABLET | Freq: Every day | ORAL | Status: DC
  Administered 2022-07-27 – 2022-07-29 (×3): 10 mg via ORAL
  Filled 2022-07-27 (×3): qty 1

## 2022-07-27 MED ORDER — OXYCODONE HCL 5 MG PO TABS
5.0000 mg | ORAL_TABLET | ORAL | Status: DC | PRN
  Administered 2022-07-28 – 2022-07-29 (×2): 5 mg via ORAL
  Filled 2022-07-27 (×2): qty 1

## 2022-07-27 MED ORDER — INSULIN ASPART 100 UNIT/ML IJ SOLN
0.0000 [IU] | Freq: Three times a day (TID) | INTRAMUSCULAR | Status: DC
  Administered 2022-07-27 – 2022-07-28 (×2): 1 [IU] via SUBCUTANEOUS
  Administered 2022-07-28: 7 [IU] via SUBCUTANEOUS
  Administered 2022-07-28 – 2022-07-29 (×4): 2 [IU] via SUBCUTANEOUS

## 2022-07-27 MED ORDER — LEVOTHYROXINE SODIUM 137 MCG PO TABS
137.0000 ug | ORAL_TABLET | Freq: Every day | ORAL | Status: DC
  Administered 2022-07-28: 137 ug via ORAL
  Filled 2022-07-27: qty 1

## 2022-07-27 MED ORDER — TIZANIDINE HCL 4 MG PO TABS
4.0000 mg | ORAL_TABLET | Freq: Every day | ORAL | Status: DC
  Administered 2022-07-27 – 2022-07-29 (×4): 4 mg via ORAL
  Filled 2022-07-27 (×4): qty 1

## 2022-07-27 NOTE — Assessment & Plan Note (Signed)
-  UA indicative of UTI - Urine culture pending - Previous micro shows E. coli that sensitive to Rocephin - Continue Rocephin - Continue to monitor

## 2022-07-27 NOTE — Progress Notes (Addendum)
[facility] Progress Note  Patient: Toni Hill     ZYS:[id number]  DOA: 07/26/2022   PCP: Farrel Gordon, DO       Brief hospital course: 77 year old female with diabetes mellitus type 2 (noncompliant with diet), CVA, GERD, h/o cholecystectomy with chronic CBD dilatation, hypertension, hypothyroidism, hyperlipidemia presented to the hospital with nausea abd pain and diarrhea.  Numerous episodes of diarrhea. She has not been eating for few days, checking her sugars or taking her insulin.  She was confused in the ED and it was difficult to obtain a history from her.  She was tachycardic - rate as high as 133, RR as high ast 30.  UA show large bacteria > 50 WBC/hpf, small amount of RBC but also squamous cell - CT > b/l non obstruction renal calculi and b/l enlarging cysts  Plan Ceftriaxone  IVF  Subjective:  Wants to try to eat again but then states she does not want food for fear of diarrhea. No abdominal pain or nausea the moment.  Long conversation with patient and her older sister who is at the bedside.  Assessment and Plan: Principal Problem:   Diarrhea in adult patient/ oral intake and dehydration - U sp gravity 1.040 - US liver unremarkable- has no gallbladder - start clears as she appears to be improving - mid abd pain resolved - cont NS at 100cc/hr  Active Problems:   Type 2 diabetes mellitus (HCC) - CBG 80 this AM w/o long acting insulin last night - admittedly non compliant with diet because " I am 77 years old". - Novolog for now and follow sugars    UTI (urinary tract infection) - difficult to tell if her her GI symptoms started because of a UTI or she had a gastroenteritis which led to a UTI - Cr is normal - cont Ceftriaxone  Sepsis- POA- 2 sirs criteria and an infection - signs and symptoms improving  Mild troponin elevation - 38> 41 - no further w/u- likely due to acute stress  Chronic Right leg weakness and immobility  Follows with IM teaching  service at [facility].      Code Status: Full Code Consultants: none Level of Care: Level of care: Telemetry Total time on patient care: 45  DVT prophylaxis:  heparin injection 5,000 Units Start: 07/27/22 0600 SCDs Start: 07/27/22 0154     Objective:   Vitals:   07/27/22 0756 07/27/22 0830 07/27/22 0900 07/27/22 1141  BP:  (!) 116/50 120/77 (!) 108/48  Pulse:  72 83 74  Resp:  '14 17 20  '$ Temp: 97.6 F (36.4 C)   97.7 F (36.5 C)  TempSrc: Oral   Oral  SpO2:  96% 96% 99%  Weight:      Height:       Filed Weights   07/26/22 1507  Weight: 72.6 kg   Exam: General exam: Appears comfortable  HEENT: oral mucosa moist Respiratory system: Clear to auscultation.  Cardiovascular system: S1 & S2 heard  Gastrointestinal system: Abdomen soft, non-tender, nondistended. Normal bowel sounds   Extremities: No cyanosis, clubbing or edema- right leg 0/5 Psychiatry:  Mood & affect appropriate.      CBC: Recent Labs  Lab 07/26/22 1655 07/26/22 1658  WBC  --  9.4  NEUTROABS  --  7.9*  HGB 15.0 14.0  HCT 44.0 44.3  MCV  --  97.8  PLT  --  932   Basic Metabolic Panel: Recent Labs  Lab 07/26/22 1655 07/26/22 1658 07/27/22 0459  NA 140 139 139  K 4.7 4.7 4.0  CL 104 101 107  CO2  --  25 25  GLUCOSE 193* 201* 108*  BUN 27* 27* 24*  CREATININE 0.90 1.18* 0.99  CALCIUM  --  9.8 8.8*  MG  --  2.0 1.7   GFR: Estimated Creatinine Clearance: 51.3 mL/min (by C-G formula based on SCr of 0.99 mg/dL).  Scheduled Meds:  diclofenac Sodium  2 g Topical QID   ezetimibe  10 mg Oral Daily   heparin  5,000 Units Subcutaneous Q8H   insulin aspart  0-9 Units Subcutaneous TID WC   [START ON 07/28/2022] levothyroxine  137 mcg Oral QAC breakfast   pantoprazole  40 mg Oral Daily   pregabalin  150 mg Oral BID   sertraline  200 mg Oral Daily   simvastatin  40 mg Oral Daily   tiZANidine  4 mg Oral QHS   Continuous Infusions:  sodium chloride 100 mL/hr at 07/27/22 1315   cefTRIAXone  (ROCEPHIN)  IV     Imaging and lab data was personally reviewed US Abdomen Limited RUQ (LIVER/GB)  Result Date: 07/27/2022 CLINICAL DATA:  Dilated bile duct EXAM: ULTRASOUND ABDOMEN LIMITED RIGHT UPPER QUADRANT COMPARISON:  CT abdomen/pelvis 1 day prior FINDINGS: Gallbladder: Surgically absent. Common bile duct: Diameter: 5 mm Liver: Evaluation of the left hepatic lobe is suboptimal due to bowel gas. No focal lesion is seen in the imaged liver. There is mild intrahepatic biliary ductal dilatation. Portal vein is patent on color Doppler imaging with normal direction of blood flow towards the liver. Other: None. IMPRESSION: Mild intrahepatic biliary ductal dilation with no obstructing lesion seen. Findings may be due to the history of cholecystectomy. Correlate with LFTs. Electronically Signed   By: Valetta Mole M.D.   On: 07/27/2022 09:40   DG Chest Portable 1 View  Result Date: 07/27/2022 CLINICAL DATA:  Nausea and abdominal pain. EXAM: PORTABLE CHEST 1 VIEW COMPARISON:  July 02, 2019 FINDINGS: The heart size and mediastinal contours are within normal limits. There is marked severity calcification of the aortic arch. Low lung volumes are noted. Mild linear atelectasis is seen within the perihilar region on the right. There is mild, stable elevation of the right hemidiaphragm. There is no evidence of a pleural effusion or pneumothorax. Radiopaque surgical clips are seen within the right upper quadrant. A chronic fracture deformity is seen involving the surgical neck of the proximal left humerus. Multilevel degenerative changes are seen throughout the thoracic spine. IMPRESSION: Low lung volumes with mild right perihilar linear atelectasis. Electronically Signed   By: Virgina Norfolk M.D.   On: 07/27/2022 01:15   CT ABDOMEN PELVIS W CONTRAST  Result Date: 07/26/2022 CLINICAL DATA:  Acute nonlocalized abdominal pain EXAM: CT ABDOMEN AND PELVIS WITH CONTRAST TECHNIQUE: Multidetector CT imaging of the  abdomen and pelvis was performed using the standard protocol following bolus administration of intravenous contrast. RADIATION DOSE REDUCTION: This exam was performed according to the departmental dose-optimization program which includes automated exposure control, adjustment of the mA and/or kV according to patient size and/or use of iterative reconstruction technique. CONTRAST:  77m OMNIPAQUE IOHEXOL 300 MG/ML SOLN IV. No oral contrast. COMPARISON:  12/17/2020 FINDINGS: Lower chest: Subsegmental atelectasis BILATERAL lower lobes. Hepatobiliary: Post cholecystectomy. Mild intrahepatic and extrahepatic biliary dilatation slightly increased from previous exam. No focal hepatic mass. Pancreas: Atrophic pancreas without mass Spleen: Mass normal appearance Adrenals/Urinary Tract: RIGHT adrenal gland normal. LEFT adrenal mass 2.0 x 1.7 cm, measuring 49 HU; on  prior noncontrast exam this measured 0 HU consistent with lipid rich adenoma; no follow-up imaging recommended. Numerous BILATERAL renal cysts low to intermediate attenuation, largest lesion anterior RIGHT kidney 4.7 x 3.9 cm image 36 previously 6.3 x 5.7 cm. These are increased in size and number since 11/20/2016. Scattered nonobstructing renal calculi. Tiny calculus within urinary bladder dependently. No hydronephrosis or hydroureter. No ureteral calcifications. Bladder otherwise unremarkable. Stomach/Bowel: Colonic diverticulosis primarily at sigmoid colon. No CT evidence of diverticulitis. Normal appendix. Stomach and bowel loops otherwise normal appearance. Vascular/Lymphatic: Atherosclerotic calcifications aorta and iliac arteries without aneurysm. Scattered pelvic phleboliths. No adenopathy. Reproductive: Uterus surgically absent.  Normal sized ovaries. Other: No free air or free fluid. No hernia. Linear high attenuation focus in the anterior mid abdomen supraumbilical appears unchanged question sequela of patient's reported prior ventral hernia repair. No  inflammatory process. Musculoskeletal: Osseous demineralization. Probable vertebral hemangioma L4 unchanged. IMPRESSION: Colonic diverticulosis without evidence of diverticulitis. Multiple BILATERAL nonobstructing renal calculi. Tiny calculus within urinary bladder dependently. Numerous BILATERAL renal cysts low to intermediate attenuation, increased in size and number since 11/20/2016; follow-up characterization by non emergent renal protocol MRI or CT with and without contrast recommended to assess indeterminate lesions. Post cholecystectomy with mild intrahepatic and extrahepatic biliary dilatation, slightly increased from previous exam, recommend correlation with LFTs. Stable LEFT adrenal adenoma. No acute intra-abdominal or intrapelvic abnormalities. Aortic Atherosclerosis (ICD10-I70.0). Electronically Signed   By: Lavonia Dana M.D.   On: 07/26/2022 20:10    LOS: 0 days   Author: Debbe Odea  07/27/2022 4:03 PM  To contact [facility]>   Check the care team in [facility] and look for the attending/consulting [place] provider listed  Log into [email or web address] and use [facility]'s universal password   Go to> "[facility]"  and find provider  If you still have difficulty reaching the provider, please page the [facility] (Director on Call) for the Hospitalists listed on amion

## 2022-07-27 NOTE — Assessment & Plan Note (Signed)
-  Continue statin and Zetia

## 2022-07-27 NOTE — Assessment & Plan Note (Signed)
-  12 units insulin at baseline - Holding basal insulin, sliding scale coverage here - Currently n.p.o. for bowel rest - Continue to monitor

## 2022-07-27 NOTE — Care Management Obs Status (Signed)
[place] NOTIFICATION   Patient Details  Name: Toni Hill MRN: [id number] Date of Birth: [birth year]   Medicare Observation Status Notification Given:  Yes    Tommy Medal 07/27/2022, 1:30 PM

## 2022-07-27 NOTE — Assessment & Plan Note (Signed)
-  CT abdomen pelvis shows extrahepatic biliary dilatation increased from prior - Status post cholecystectomy - Advises to correlate with LFTs-AST 27, ALT 16 - Right upper quadrant ultrasound - Continue to monitor

## 2022-07-27 NOTE — Assessment & Plan Note (Signed)
-  Patient initially tacky at 12 - Patient heart rate has been in the 80s since midnight - Likely related to dehydration and infection - EKG shows sinus tachycardia with a rate of 124 QTc 500 - Continue to monitor

## 2022-07-27 NOTE — Assessment & Plan Note (Signed)
-  Continue Protonix °

## 2022-07-27 NOTE — H&P (Signed)
History and Physical    Patient: Toni Hill [id number]:[id number] DOB: 10-30-1945 DOA: 07/26/2022 DOS: the patient was seen and examined on 07/27/2022 PCP: Farrel Gordon, DO  Patient coming from: Home  Chief Complaint:  Chief Complaint  Patient presents with   Nausea   HPI: Toni Hill is a 77 y.o. female with medical history significant of bipolar 1 disorder, diabetes mellitus type 2, GERD, hypertension, hyperlipidemia, hypothyroidism, incontinence, type 2 diabetes mellitus, and more presents the ED with her sister with a chief complaint of diarrhea.  Initially when talking to patient she does not know why she is here.  She complains of her left leg hurting.  Apparently she had a total knee some years ago and since then has had intermittent left leg pain.  She describes it as an achy pain.  She reports it is constant.  Nothing makes it better.  She is requesting pain medication.  When reminded that she actually came in for abdominal pain, she reports that her abdomen never hurt.  She finally does remember that she did have diarrhea.  She does not remember how many times.  It was nonbloody.  She had 1 episode of vomiting.  That was also nonbloody.  In the ER she was not p.o. challenged, but would probably be ready to be p.o. challenged later today.  Patient is bedridden at baseline, with a paralyzed right lower extremity.  She has no other complaints at this time.  This includes no dysuria or hematuria. Patient does not smoke and does not drink.  She would like to be full code. Review of Systems: As mentioned in the history of present illness. All other systems reviewed and are negative. Past Medical History:  Diagnosis Date   At risk for adverse drug event 01/22/2019     RIOSORD (Risk Index for Overdose or Serious Opioid -induced Respiratory Depression Risk ) is a tool which determines that risk over the  ensuing 6 months . Based on point total for personal medical co-morbidities and present drug  therapies ; the risk : benefit ratio of any drug regimen can be defined and informed consent documented. Reference: [facility] October 2017    Bilateral lower extremity edema    right > left   Bipolar 1 disorder ([place])    CKD (chronic kidney disease) stage 3, GFR 30-59 ml/min (HCC)     Dr Lawson Radar, [facility]   Constipation 06/06/2020   Diabetes, polyneuropathy ([place])    FEET AND FINGER TIPS   Fibromyalgia    GERD (gastroesophageal reflux disease)    History of chronic gastritis    History of COVID-24 Aug 2019   Hyperlipidemia    Hypertension    per pt was take off bp medication because of kidney disease told by her nephrologist   Hypothyroidism    Intractable nausea and vomiting 01/02/2020   Mixed incontinence urge and stress    OA (osteoarthritis)    KNEES   OAB (overactive bladder)    PONV (postoperative nausea and vomiting)    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   Type 2 diabetes mellitus treated with insulin ([place])    Wears glasses    Past Surgical History:  Procedure Laterality Date   CHOLECYSTECTOMY OPEN  1990's   COLONOSCOPY  last one 07-17-2014   CYSTOSCOPY WITH INJECTION N/A 10/29/2016   Procedure: CYSTOSCOPY WITH INJECTION BOTOX 100 UNITS, urethral dilation;  Surgeon: Carolan Clines,  MD;  Location: [facility];  Service: Urology;  Laterality: N/A;   ESOPHAGOGASTRODUODENOSCOPY  last one 07-01-2016   EXCISIONAL BREAST BX  1990   BENIGN   HARDWARE REMOVAL Right 03/05/2020   Procedure: HARDWARE REMOVAL RIGHT ANKLE;  Surgeon: Leandrew Koyanagi, MD;  Location: [facility];  Service: Orthopedics;  Laterality: Right;   ORIF TIBIA & FIBULA FRACTURES  2010   retained rod   TONSILLECTOMY  age 26   TOTAL KNEE ARTHROPLASTY Left 01/15/2019   Procedure: LEFT TOTAL KNEE ARTHROPLASTY;  Surgeon: Leandrew Koyanagi, MD;  Location: [facility];  Service: Orthopedics;  Laterality: Left;   VAGINAL HYSTERECTOMY  1979   VENTRAL  HERNIA REPAIR  1990's   Social History:  reports that she has never smoked. She has never used smokeless tobacco. She reports that she does not drink alcohol and does not use drugs.  Allergies  Allergen Reactions   Bactrim [Sulfamethoxazole-Trimethoprim]     Patient experienced nephrotoxicity in the setting of CKD. Strongly recommend against using this antibiotic in the future.    Cephalexin Itching   Gabapentin     Kidney issue   Morphine And Related Nausea And Vomiting   Vioxx [Rofecoxib]     GI bleeding   Penicillins Hives and Itching    Has patient had a PCN reaction causing immediate rash, facial/tongue/throat swelling, SOB or lightheadedness with hypotension: No Has patient had a PCN reaction causing severe rash involving mucus membranes or skin necrosis: No Has patient had a PCN reaction that required hospitalization: no Has patient had a PCN reaction occurring within the last 10 years: No If all of the above answers are "NO", then may proceed with Cephalosporin use.     Family History  Problem Relation Age of Onset   Breast cancer Mother 33   Cancer Father        ? type   Breast cancer Sister 53   Breast cancer Maternal Grandmother        ? age    Prior to Admission medications   Medication Sig Start Date End Date Taking? Authorizing Provider  acetaminophen (TYLENOL) 500 MG tablet Take 1,000 mg by mouth in the morning and at bedtime.   Yes [provider]  Calcium Carb-Cholecalciferol (CALCIUM 600/VITAMIN D3) 600-800 MG-UNIT TABS Take 1 tablet by mouth daily.    Yes [provider]  docusate sodium (COLACE) 100 MG capsule Take 100 mg by mouth daily.   Yes [provider]  ezetimibe (ZETIA) 10 MG tablet TAKE 1 TABLET BY MOUTH EVERY DAY IN THE EVENING 07/14/22  Yes Farrel Gordon, DO  insulin aspart (NOVOLOG) 100 UNIT/ML injection Inject 6 Units into the skin daily with breakfast AND 10 Units daily with lunch AND 10 Units daily with supper. 02/10/22   Yes Farrel Gordon, DO  insulin degludec (TRESIBA FLEXTOUCH) 100 UNIT/ML FlexTouch Pen Inject 12 Units into the skin daily. 02/10/22  Yes Farrel Gordon, DO  pantoprazole (PROTONIX) 40 MG tablet TAKE 1 TABLET BY MOUTH EVERY DAY Patient taking differently: Take 40 mg by mouth daily. 07/14/22  Yes Farrel Gordon, DO  pregabalin (LYRICA) 150 MG capsule TAKE 1 CAPSULE BY MOUTH TWO TIMES A DAY 03/10/22  Yes Farrel Gordon, DO  sertraline (ZOLOFT) 100 MG tablet TAKE 2 TABLETS BY MOUTH EVERY DAY 05/04/22  Yes Farrel Gordon, DO  simvastatin (ZOCOR) 40 MG tablet TAKE 1 TABLET BY MOUTH EVERY DAY 07/26/22  Yes Farrel Gordon, DO  SYNTHROID 137 MCG tablet  TAKE 1 TABLET (137 MCG TOTAL) BY MOUTH DAILY BEFORE BREAKFAST. BRAND NAME ONLY Patient taking differently: Take 137 mcg by mouth daily before breakfast. Name  Brand 07/26/22  Yes Farrel Gordon, DO  tiZANidine (ZANAFLEX) 4 MG tablet TAKE 1 TABLET (4 MG TOTAL) BY MOUTH IN THE MORNING AND AT BEDTIME. Patient taking differently: Take 4 mg by mouth See admin instructions. Take 1 tablet in the morning and 1 tablet at bedtime 04/09/22  Yes Farrel Gordon, DO  VITAMIN E PO Take 400 Units by mouth 2 (two) times daily.   Yes [provider]  Accu-Chek Softclix Lancets lancets Use to check blood sugar up to 4 times per day. 03/05/22   Farrel Gordon, DO  Blood Glucose Monitoring Suppl (ACCU-CHEK GUIDE) w/Device KIT Check blood sugar 1 times per day. 02/10/22   Farrel Gordon, DO  diclofenac Sodium (VOLTAREN) 1 % GEL APPLY 1 APPLICATION TOPICALLY 4 TIMES A DAY Patient taking differently: Apply 2 g topically 4 (four) times daily. 05/10/22   Farrel Gordon, DO  glucose blood (ACCU-CHEK GUIDE) test strip Check blood sugar up to 4 times per day 03/05/22   Farrel Gordon, DO  Insulin Pen Needle (B-D ULTRAFINE III SHORT PEN) 31G X 8 MM MISC 1 each by Does not apply route daily. Diagnosis code: E11.9. Used to inject insulin daily. 12/03/21   Maudie Mercury, MD  ketoconazole (NIZORAL) 2 % cream Apply 1 Application  topically daily. 02/10/22   Farrel Gordon, DO  lidocaine (LIDODERM) 5 % PLACE 1 PATCH ONTO THE SKIN DAILY. REMOVE & DISCARD PATCH WITHIN 12 HOURS OR AS DIRECTED BY MD Patient not taking: Reported on 02/19/2022 01/13/22   Farrel Gordon, DO  OneTouch Delica Lancets 42A MISC Check blood sugar 3 times a day 03/14/20   Maudie Mercury, MD    Physical Exam: Vitals:   07/26/22 2245 07/26/22 2332 07/27/22 0030 07/27/22 0330  BP:  139/86 132/72 (!) 114/55  Pulse: (!) 118 (!) 108 (!) 116 81  Resp: '17 18 17 20  '$ Temp:  98.1 F (36.7 C)    SpO2: 94% 96% 96% 97%  Weight:      Height:       1.  General: Patient lying supine in bed,  no acute distress   2. Psychiatric: Alert and oriented x 3, mood and behavior normal for situation, pleasant and cooperative with exam   3. Neurologic: Speech and language are normal, face is symmetric, moves 3 extremities voluntarily which is her baseline, at baseline without acute deficits on limited exam   4. HEENMT:  Head is atraumatic, normocephalic, pupils reactive to light, neck is supple, trachea is midline, mucous membranes are moist   5. Respiratory : Lungs are clear to auscultation bilaterally without wheezing, rhonchi, rales, no cyanosis, no increase in work of breathing or accessory muscle use   6. Cardiovascular : Heart rate normal, rhythm is regular, no murmurs, rubs or gallops, no peripheral edema, peripheral pulses palpated   7. Gastrointestinal:  Abdomen is soft, nondistended, nontender to palpation bowel sounds active, no masses or organomegaly palpated   8. Skin:  Skin is warm, dry and intact without rashes, acute lesions, or ulcers on limited exam   9.Musculoskeletal:  Right lower extremity is atrophic with plantarflexed foot and flexed toes, left knee does have surgical scar  Data Reviewed:  In the ED Temp 98.1-98.8, heart rate 108-133, respiratory rate 15-30, blood pressure 132/70-164/94, satting at 96% pH 7.35, pCO2 55 Hyperglycemia on  chemistry Initial troponin 38,  repeat troponin with morning labs CT abdomen pelvis shows calculi and cysts in bilateral kidneys, extrahepatic biliary dilatation. Patient was given Zofran, Reglan, 1 L LR, fentanyl, and Rocephin in the ED Admission requested for persistent tachycardia  Assessment and Plan: * Tachycardia - Patient initially tacky at 233 - Patient heart rate has been in the 80s since midnight - Likely related to dehydration and infection - EKG shows sinus tachycardia with a rate of 124 QTc 500 - Continue to monitor  GERD (gastroesophageal reflux disease) - Continue Protonix  UTI (urinary tract infection) - UA indicative of UTI - Urine culture pending - Previous micro shows E. coli that sensitive to Rocephin - Continue Rocephin - Continue to monitor  Hyperlipidemia - Continue statin and Zetia  Type 2 diabetes mellitus (HCC) - 12 units insulin at baseline - Holding basal insulin, sliding scale coverage here - Currently n.p.o. for bowel rest - Continue to monitor      Advance Care Planning:   Code Status: Full Code  Consults: None  Family Communication: Sister at bedside  Severity of Illness: The appropriate patient status for this patient is OBSERVATION. Observation status is judged to be reasonable and necessary in order to provide the required intensity of service to ensure the patient's safety. The patient's presenting symptoms, physical exam findings, and initial radiographic and laboratory data in the context of their medical condition is felt to place them at decreased risk for further clinical deterioration. Furthermore, it is anticipated that the patient will be medically stable for discharge from the hospital within 2 midnights of admission.   Author: Rolla Plate, DO 07/27/2022 5:57 AM  For on call review [email or web address].

## 2022-07-28 DIAGNOSIS — R197 Diarrhea, unspecified: Secondary | ICD-10-CM | POA: Diagnosis not present

## 2022-07-28 LAB — URINE CULTURE

## 2022-07-28 LAB — T4, FREE: Free T4: 1.2 ng/dL — ABNORMAL HIGH (ref 0.61–1.12)

## 2022-07-28 LAB — GLUCOSE, CAPILLARY
Glucose-Capillary: 216 mg/dL — ABNORMAL HIGH (ref 70–99)
Glucose-Capillary: 330 mg/dL — ABNORMAL HIGH (ref 70–99)

## 2022-07-28 MED ORDER — LEVOTHYROXINE SODIUM 100 MCG PO TABS
100.0000 ug | ORAL_TABLET | Freq: Every day | ORAL | Status: DC
  Administered 2022-07-29: 100 ug via ORAL
  Filled 2022-07-28: qty 1

## 2022-07-28 NOTE — TOC Progression Note (Signed)
  Transition of Care [facility]) Screening Note   Patient Details  Name: Toni Hill Date of Birth: 07/06/1945   [facility]) CM/SW Contact:    Boneta Lucks, RN Phone Number: 07/28/2022, 12:00 PM  PT eval pending  Transition of Care Department (TOC) has reviewed patient and no TOC needs have been identified at this time. We will continue to monitor patient advancement through interdisciplinary progression rounds. If new patient transition needs arise, please place a TOC consult.      Barriers to Discharge: Continued Medical Work up  Expected Discharge Plan and Services      Living arrangements for the past 2 months: Single Family Home     Social Determinants of Health (SDOH) Interventions SDOH Screenings   Food Insecurity: No Food Insecurity (07/27/2022)  Housing: Low Risk  (07/27/2022)  Transportation Needs: No Transportation Needs (07/27/2022)  Utilities: Not At Risk (07/27/2022)  Alcohol Screen: Low Risk  (02/10/2022)  Depression (PHQ2-9): Low Risk  (02/10/2022)  Financial Resource Strain: Low Risk  (02/10/2022)  Physical Activity: Inactive (02/10/2022)  Social Connections: Socially Isolated (02/10/2022)  Stress: Stress Concern Present (02/10/2022)  Tobacco Use: Low Risk  (07/26/2022)

## 2022-07-28 NOTE — Progress Notes (Signed)
PROGRESS NOTE    [name]  NTI:[id number] DOB: 16-Jan-1946 DOA: 07/26/2022 PCP: Farrel Gordon, DO     Brief Narrative:  Toni Hill is a 77 year old female with diabetes mellitus type 2 (noncompliant with diet), CVA, GERD, h/o cholecystectomy with chronic CBD dilatation, hypertension, hypothyroidism, hyperlipidemia presented to the hospital with nausea, abd pain, and diarrhea. She has not been eating for few days, checking her sugars or taking her insulin.  She was confused in the ED and it was difficult to obtain a history from her.  She was tachycardic - rate as high as 133, RR as high as 30. UA showed large bacteria > 50 WBC/hpf, small amount of RBC but also squamous cell. CT > b/l non obstruction renal calculi and b/l enlarging cysts.  Patient was started on IV fluids and ceftriaxone.  New events last 24 hours / Subjective: Has not had any further diarrhea.  Admits to some nausea but no further vomiting.  Has been tolerating clear liquid diet, wants to advance diet today.  Reports that she lives at home with sister and brother-in-law who are both in their 58s.  Assessment & Plan:   Principal Problem:   Diarrhea in adult patient Active Problems:   Type 2 diabetes mellitus (HCC)   Hyperlipidemia   Bipolar 1 disorder (HCC)   UTI (urinary tract infection)   Right leg weakness   GERD (gastroesophageal reflux disease)   Common bile duct dilatation   Dehydration   Decreased oral intake, diarrhea -Right upper quadrant abdominal ultrasound without obstructing lesion. LFTs within normal limits  -Diarrhea has now resolved -IVF  -Improving, advance diet to full liquids today  Sepsis secondary to urinary tract infection, present on admission -Urine culture is pending -Continue empiric Rocephin  Diabetes mellitus -Sliding scale insulin  Hypothyroidism -Continue synthroid -Decreased TSH. Check free T4  Demand ischemia -Troponin 38, 41 -Secondary to sepsis  CKD stage  IIIa -Stable  Chronic right leg weakness and immobility -Resides at home with family -Zanaflex, lyrica -PT OT evaluation pending  HLD -Zocor  Mood disorder  -Zoloft   DVT prophylaxis:  heparin injection 5,000 Units Start: 07/27/22 0600 SCDs Start: 07/27/22 0154  Code Status: Full code Family Communication: No family at bedside Disposition Plan:  Status is: Inpatient Remains inpatient appropriate because: Advance diet today, urine culture is pending, remains on IV Rocephin   Antimicrobials:  Anti-infectives (From admission, onward)    Start     Dose/Rate Route Frequency Ordered Stop   07/27/22 2200  cefTRIAXone (ROCEPHIN) 1 g in sodium chloride 0.9 % 100 mL IVPB        1 g 200 mL/hr over 30 Minutes Intravenous Every 24 hours 07/27/22 0158     07/27/22 0200  cefTRIAXone (ROCEPHIN) 1 g in sodium chloride 0.9 % 100 mL IVPB  Status:  Discontinued        1 g 200 mL/hr over 30 Minutes Intravenous Every 24 hours 07/27/22 0153 07/27/22 0158   07/27/22 0100  cefTRIAXone (ROCEPHIN) 2 g in sodium chloride 0.9 % 100 mL IVPB        2 g 200 mL/hr over 30 Minutes Intravenous  Once 07/27/22 0051 07/27/22 0153        Objective: Vitals:   07/27/22 1002 07/27/22 1141 07/27/22 2031 07/28/22 0434  BP:  (!) 108/48 (!) 129/56 (!) 112/59  Pulse:  74 73 71  Resp:  '20 15 18  '$ Temp:  97.7 F (36.5 C) 98.3 F (36.8 C) 97.6 F (  36.4 C)  TempSrc:  Oral Oral Oral  SpO2:  99% 96% 94%  Weight: 72.6 kg     Height: '5\' 9"'$  (1.753 m)       Intake/Output Summary (Last 24 hours) at 07/28/2022 1110 Last data filed at 07/28/2022 0900 Gross per 24 hour  Intake 3509.7 ml  Output --  Net 3509.7 ml   Filed Weights   07/26/22 1507 07/27/22 1002  Weight: 72.6 kg 72.6 kg    Examination:  General exam: Appears calm and comfortable  Respiratory system: Clear to auscultation. Respiratory effort normal. No respiratory distress. No conversational dyspnea.  Cardiovascular system: S1 & S2 heard, RRR.  No murmurs.  Gastrointestinal system: Abdomen is nondistended, soft and nontender. Normal bowel sounds heard. Central nervous system: Alert and oriented.  Psychiatry: Judgement and insight appear normal. Mood & affect appropriate.   Data Reviewed: I have personally reviewed following labs and imaging studies  CBC: Recent Labs  Lab 07/26/22 1655 07/26/22 1658  WBC  --  9.4  NEUTROABS  --  7.9*  HGB 15.0 14.0  HCT 44.0 44.3  MCV  --  97.8  PLT  --  622   Basic Metabolic Panel: Recent Labs  Lab 07/26/22 1655 07/26/22 1658 07/27/22 0459  NA 140 139 139  K 4.7 4.7 4.0  CL 104 101 107  CO2  --  25 25  GLUCOSE 193* 201* 108*  BUN 27* 27* 24*  CREATININE 0.90 1.18* 0.99  CALCIUM  --  9.8 8.8*  MG  --  2.0 1.7   GFR: Estimated Creatinine Clearance: 51.3 mL/min (by C-G formula based on SCr of 0.99 mg/dL). Liver Function Tests: Recent Labs  Lab 07/26/22 1658 07/27/22 0459  AST 27 22  ALT 16 13  ALKPHOS 65 49  BILITOT 0.8 0.5  PROT 7.3 5.7*  ALBUMIN 3.9 3.0*   Recent Labs  Lab 07/26/22 1658  LIPASE 25   No results for input(s): "AMMONIA" in the last 168 hours. Coagulation Profile: No results for input(s): "INR", "PROTIME" in the last 168 hours. Cardiac Enzymes: No results for input(s): "CKTOTAL", "CKMB", "CKMBINDEX", "TROPONINI" in the last 168 hours. BNP (last 3 results) No results for input(s): "PROBNP" in the last 8760 hours. HbA1C: No results for input(s): "HGBA1C" in the last 72 hours. CBG: Recent Labs  Lab 07/26/22 2331 07/27/22 0211 07/27/22 0435 07/27/22 0732 07/27/22 1719  GLUCAP 169* 166* 109* 80 121*   Lipid Profile: No results for input(s): "CHOL", "HDL", "LDLCALC", "TRIG", "CHOLHDL", "LDLDIRECT" in the last 72 hours. Thyroid Function Tests: Recent Labs    07/27/22 0459  TSH 0.230*   Anemia Panel: No results for input(s): "VITAMINB12", "FOLATE", "FERRITIN", "TIBC", "IRON", "RETICCTPCT" in the last 72 hours. Sepsis Labs: Recent Labs   Lab 07/26/22 1658  LATICACIDVEN 1.2    No results found for this or any previous visit (from the past 240 hour(s)).    Radiology Studies: US Abdomen Limited RUQ (LIVER/GB)  Result Date: 07/27/2022 CLINICAL DATA:  Dilated bile duct EXAM: ULTRASOUND ABDOMEN LIMITED RIGHT UPPER QUADRANT COMPARISON:  CT abdomen/pelvis 1 day prior FINDINGS: Gallbladder: Surgically absent. Common bile duct: Diameter: 5 mm Liver: Evaluation of the left hepatic lobe is suboptimal due to bowel gas. No focal lesion is seen in the imaged liver. There is mild intrahepatic biliary ductal dilatation. Portal vein is patent on color Doppler imaging with normal direction of blood flow towards the liver. Other: None. IMPRESSION: Mild intrahepatic biliary ductal dilation with no obstructing lesion seen. Findings  may be due to the history of cholecystectomy. Correlate with LFTs. Electronically Signed   By: Valetta Mole M.D.   On: 07/27/2022 09:40   DG Chest Portable 1 View  Result Date: 07/27/2022 CLINICAL DATA:  Nausea and abdominal pain. EXAM: PORTABLE CHEST 1 VIEW COMPARISON:  July 02, 2019 FINDINGS: The heart size and mediastinal contours are within normal limits. There is marked severity calcification of the aortic arch. Low lung volumes are noted. Mild linear atelectasis is seen within the perihilar region on the right. There is mild, stable elevation of the right hemidiaphragm. There is no evidence of a pleural effusion or pneumothorax. Radiopaque surgical clips are seen within the right upper quadrant. A chronic fracture deformity is seen involving the surgical neck of the proximal left humerus. Multilevel degenerative changes are seen throughout the thoracic spine. IMPRESSION: Low lung volumes with mild right perihilar linear atelectasis. Electronically Signed   By: Virgina Norfolk M.D.   On: 07/27/2022 01:15   CT ABDOMEN PELVIS W CONTRAST  Result Date: 07/26/2022 CLINICAL DATA:  Acute nonlocalized abdominal pain  EXAM: CT ABDOMEN AND PELVIS WITH CONTRAST TECHNIQUE: Multidetector CT imaging of the abdomen and pelvis was performed using the standard protocol following bolus administration of intravenous contrast. RADIATION DOSE REDUCTION: This exam was performed according to the departmental dose-optimization program which includes automated exposure control, adjustment of the mA and/or kV according to patient size and/or use of iterative reconstruction technique. CONTRAST:  20m OMNIPAQUE IOHEXOL 300 MG/ML SOLN IV. No oral contrast. COMPARISON:  12/17/2020 FINDINGS: Lower chest: Subsegmental atelectasis BILATERAL lower lobes. Hepatobiliary: Post cholecystectomy. Mild intrahepatic and extrahepatic biliary dilatation slightly increased from previous exam. No focal hepatic mass. Pancreas: Atrophic pancreas without mass Spleen: Mass normal appearance Adrenals/Urinary Tract: RIGHT adrenal gland normal. LEFT adrenal mass 2.0 x 1.7 cm, measuring 49 HU; on prior noncontrast exam this measured 0 HU consistent with lipid rich adenoma; no follow-up imaging recommended. Numerous BILATERAL renal cysts low to intermediate attenuation, largest lesion anterior RIGHT kidney 4.7 x 3.9 cm image 36 previously 6.3 x 5.7 cm. These are increased in size and number since 11/20/2016. Scattered nonobstructing renal calculi. Tiny calculus within urinary bladder dependently. No hydronephrosis or hydroureter. No ureteral calcifications. Bladder otherwise unremarkable. Stomach/Bowel: Colonic diverticulosis primarily at sigmoid colon. No CT evidence of diverticulitis. Normal appendix. Stomach and bowel loops otherwise normal appearance. Vascular/Lymphatic: Atherosclerotic calcifications aorta and iliac arteries without aneurysm. Scattered pelvic phleboliths. No adenopathy. Reproductive: Uterus surgically absent.  Normal sized ovaries. Other: No free air or free fluid. No hernia. Linear high attenuation focus in the anterior mid abdomen supraumbilical  appears unchanged question sequela of patient's reported prior ventral hernia repair. No inflammatory process. Musculoskeletal: Osseous demineralization. Probable vertebral hemangioma L4 unchanged. IMPRESSION: Colonic diverticulosis without evidence of diverticulitis. Multiple BILATERAL nonobstructing renal calculi. Tiny calculus within urinary bladder dependently. Numerous BILATERAL renal cysts low to intermediate attenuation, increased in size and number since 11/20/2016; follow-up characterization by non emergent renal protocol MRI or CT with and without contrast recommended to assess indeterminate lesions. Post cholecystectomy with mild intrahepatic and extrahepatic biliary dilatation, slightly increased from previous exam, recommend correlation with LFTs. Stable LEFT adrenal adenoma. No acute intra-abdominal or intrapelvic abnormalities. Aortic Atherosclerosis (ICD10-I70.0). Electronically Signed   By: MLavonia DanaM.D.   On: 07/26/2022 20:10      Scheduled Meds:  diclofenac Sodium  2 g Topical QID   ezetimibe  10 mg Oral Daily   heparin  5,000 Units Subcutaneous Q8H   insulin  aspart  0-9 Units Subcutaneous TID WC   levothyroxine  137 mcg Oral QAC breakfast   pantoprazole  40 mg Oral Daily   pregabalin  150 mg Oral BID   sertraline  200 mg Oral Daily   simvastatin  40 mg Oral Daily   tiZANidine  4 mg Oral QHS   Continuous Infusions:  sodium chloride 100 mL/hr at 07/28/22 0525   cefTRIAXone (ROCEPHIN)  IV 1 g (07/27/22 2128)     LOS: 1 day   Time spent: 25 minutes   Dessa Phi, DO [facility] 07/28/2022, 11:10 AM   Available via Epic secure chat 7am-7pm After these hours, please refer to coverage provider listed on [email or web address]

## 2022-07-28 NOTE — Evaluation (Addendum)
Physical Therapy Evaluation Patient Details Name: Toni Hill MRN: [id number] DOB: 02/06/1946 Today's Date: 07/28/2022  History of Present Illness  Toni Hill is a 77 y.o. female with medical history significant of bipolar 1 disorder, diabetes mellitus type 2, GERD, hypertension, hyperlipidemia, hypothyroidism, incontinence, type 2 diabetes mellitus, and more presents the ED with her sister with a chief complaint of diarrhea.  Initially when talking to patient she does not know why she is here.  She complains of her left leg hurting.  Apparently she had a total knee some years ago and since then has had intermittent left leg pain.  She describes it as an achy pain.  She reports it is constant.  Nothing makes it better.  She is requesting pain medication.  When reminded that she actually came in for abdominal pain, she reports that her abdomen never hurt.  She finally does remember that she did have diarrhea.  She does not remember how many times.  It was nonbloody.  She had 1 episode of vomiting.  That was also nonbloody.  In the ER she was not p.o. challenged, but would probably be ready to be p.o. challenged later today.  Patient is bedridden at baseline, with a paralyzed right lower extremity.  She has no other complaints at this time.  This includes no dysuria or hematuria.  Patient does not smoke and does not drink.  She would like to be full code.   Clinical Impression  Patient limited to rolling side to side at baseline, non-ambulatory, unable to tolerate sitting up at bedside due to severe painful contractures of BLE.  Plan:  Patient discharged from physical therapy to care of nursing for rolling side to side daily as tolerated for length of stay.      Recommendations for follow up therapy are one component of a multi-disciplinary discharge planning process, led by the attending physician.  Recommendations may be updated based on patient status, additional functional criteria and insurance  authorization.  Follow Up Recommendations No PT follow up      Assistance Recommended at Discharge    Patient can return home with the following  A lot of help with bathing/dressing/bathroom;A lot of help with walking and/or transfers;Assistance with cooking/housework;Help with stairs or ramp for entrance    Equipment Recommendations None recommended by PT  Recommendations for Other Services       Functional Status Assessment Patient has not had a recent decline in their functional status     Precautions / Restrictions Precautions Precautions: Fall Restrictions Weight Bearing Restrictions: Yes RLE Weight Bearing: Non weight bearing      Mobility  Bed Mobility Overal bed mobility: Needs Assistance Bed Mobility: Rolling           General bed mobility comments: Mod assist rolling to right, Max assist rolling to left    Transfers                        Ambulation/Gait                  Stairs            Wheelchair Mobility    Modified Rankin (Stroke Patients Only)       Balance  Pertinent Vitals/Pain Pain Assessment Pain Assessment: Faces Faces Pain Scale: Hurts even more Pain Location: with movement of LLE and right knee flexion Pain Descriptors / Indicators: Sore, Guarding, Grimacing Pain Intervention(s): Limited activity within patient's tolerance, Monitored during session, Repositioned    Home Living Family/patient expects to be discharged to:: Private residence Living Arrangements: Other relatives Available Help at Discharge: Family;Available 24 hours/day Type of Home: House Home Access: Ramped entrance       Home Layout: One level Home Equipment: Wheelchair - Publishing copy (2 wheels);BSC/3in1;Hospital bed      Prior Function Prior Level of Function : Needs assist       Physical Assist : Mobility (physical);ADLs (physical) Mobility (physical):  Bed mobility;Transfers;Gait;Stairs   Mobility Comments: Patient bed bound, non-ambulatory and does not get out of bed ADLs Comments: total assist by family, uses bed pan for urination and bowel movements, bed baths     Hand Dominance   Dominant Hand: Left    Extremity/Trunk Assessment   Upper Extremity Assessment Upper Extremity Assessment: Defer to OT evaluation    Lower Extremity Assessment Lower Extremity Assessment: Generalized weakness;RLE deficits/detail;LLE deficits/detail RLE Deficits / Details: grossly 0/5, severe knee extensor, ankle plantar flexor contractures RLE: Unable to fully assess due to pain;Unable to fully assess due to immobilization RLE Sensation: WNL RLE Coordination: decreased fine motor;decreased gross motor LLE Deficits / Details: grossly 2+/5, moderate/severe knee extensor contractures LLE: Unable to fully assess due to pain;Unable to fully assess due to immobilization LLE Sensation: WNL LLE Coordination: decreased fine motor;decreased gross motor    Cervical / Trunk Assessment Cervical / Trunk Assessment: Normal  Communication   Communication: No difficulties  Cognition Arousal/Alertness: Awake/alert Behavior During Therapy: WFL for tasks assessed/performed Overall Cognitive Status: Within Functional Limits for tasks assessed                                          General Comments      Exercises     Assessment/Plan    PT Assessment Patient does not need any further PT services  PT Problem List         PT Treatment Interventions      PT Goals (Current goals can be found in the Care Plan section)  Acute Rehab PT Goals Patient Stated Goal: return home with family to assist PT Goal Formulation: With patient Time For Goal Achievement: 07/28/22 Potential to Achieve Goals: Good    Frequency       Co-evaluation               AM-PAC PT "6 Clicks" Mobility  Outcome Measure Help needed turning from your back  to your side while in a flat bed without using bedrails?: A Lot Help needed moving from lying on your back to sitting on the side of a flat bed without using bedrails?: Total Help needed moving to and from a bed to a chair (including a wheelchair)?: Total Help needed standing up from a chair using your arms (e.g., wheelchair or bedside chair)?: Total Help needed to walk in hospital room?: Total Help needed climbing 3-5 steps with a railing? : Total 6 Click Score: 7    End of Session   Activity Tolerance: Patient tolerated treatment well;Patient limited by pain Patient left: in bed;with call bell/phone within reach Nurse Communication: Mobility status PT Visit Diagnosis: Unsteadiness on feet (R26.81);Other abnormalities of gait and  mobility (R26.89);Muscle weakness (generalized) (M62.81)    Time: 1455-1510 PT Time Calculation (min) (ACUTE ONLY): 15 min   Charges:   PT Evaluation $PT Eval Low Complexity: 1 Low PT Treatments $Therapeutic Activity: 8-22 mins        3:52 PM, 07/28/22 Lonell Grandchild, MPT Physical Therapist with [facility] 336 [phone] office [phone] mobile phone

## 2022-07-28 NOTE — Progress Notes (Signed)
Patient took medications and slept the rest of this shift no complaints of pain. Continued to monitor patient.

## 2022-07-29 ENCOUNTER — Inpatient Hospital Stay (HOSPITAL_COMMUNITY): Payer: No Typology Code available for payment source

## 2022-07-29 DIAGNOSIS — R52 Pain, unspecified: Secondary | ICD-10-CM | POA: Diagnosis not present

## 2022-07-29 DIAGNOSIS — Z96652 Presence of left artificial knee joint: Secondary | ICD-10-CM | POA: Diagnosis not present

## 2022-07-29 DIAGNOSIS — M25562 Pain in left knee: Secondary | ICD-10-CM | POA: Diagnosis not present

## 2022-07-29 DIAGNOSIS — Z471 Aftercare following joint replacement surgery: Secondary | ICD-10-CM | POA: Diagnosis not present

## 2022-07-29 DIAGNOSIS — R531 Weakness: Secondary | ICD-10-CM | POA: Diagnosis not present

## 2022-07-29 DIAGNOSIS — Z743 Need for continuous supervision: Secondary | ICD-10-CM | POA: Diagnosis not present

## 2022-07-29 DIAGNOSIS — R5381 Other malaise: Secondary | ICD-10-CM | POA: Diagnosis not present

## 2022-07-29 DIAGNOSIS — R197 Diarrhea, unspecified: Secondary | ICD-10-CM | POA: Diagnosis not present

## 2022-07-29 LAB — BASIC METABOLIC PANEL
Anion gap: 6 (ref 5–15)
BUN: 17 mg/dL (ref 8–23)
CO2: 26 mmol/L (ref 22–32)
Calcium: 8.3 mg/dL — ABNORMAL LOW (ref 8.9–10.3)
Chloride: 109 mmol/L (ref 98–111)
Creatinine, Ser: 1.17 mg/dL — ABNORMAL HIGH (ref 0.44–1.00)
GFR, Estimated: 49 mL/min — ABNORMAL LOW (ref >60)
Glucose, Bld: 191 mg/dL — ABNORMAL HIGH (ref 70–99)
Potassium: 3.7 mmol/L (ref 3.5–5.1)
Sodium: 141 mmol/L (ref 135–145)

## 2022-07-29 LAB — GLUCOSE, CAPILLARY
Glucose-Capillary: 161 mg/dL — ABNORMAL HIGH (ref 70–99)
Glucose-Capillary: 186 mg/dL — ABNORMAL HIGH (ref 70–99)

## 2022-07-29 MED ORDER — FOSFOMYCIN TROMETHAMINE 3 G PO PACK
3.0000 g | PACK | Freq: Once | ORAL | Status: AC
  Administered 2022-07-29: 3 g via ORAL
  Filled 2022-07-29: qty 3

## 2022-07-29 MED ORDER — LEVOTHYROXINE SODIUM 100 MCG PO TABS: 100.0000 ug | ORAL_TABLET | Freq: Every day | ORAL | 0 refills | Status: AC

## 2022-07-29 MED ORDER — TIZANIDINE HCL 4 MG PO TABS: 4.0000 mg | ORAL_TABLET | Freq: Every day | ORAL | 0 refills | Status: AC

## 2022-07-29 NOTE — Progress Notes (Signed)
OT Cancellation Note  Patient Details Name: [name] MRN: [id number] DOB: 03-Jan-1946   Cancelled Treatment:    Reason Eval/Treat Not Completed: OT screened, no needs identified, will sign off. Per chart review pt is bed bound at baseline with total assist from family. Pt will be removed from OT list. Thank you.   Tin Engram OT, MOT   Larey Seat 07/29/2022, 8:13 AM

## 2022-07-29 NOTE — Progress Notes (Signed)
CBG 182 at 2025 on 07/28/2022. Glucose monitor has not transferred over results into chart.   Patient slept through the night. Continued to monitor patient.

## 2022-07-29 NOTE — Care Management Important Message (Signed)
Important Message  Patient Details  Name: Toni Hill MRN: [id number] Date of Birth: [birth year]   Medicare Important Message Given:  N/A - LOS <3 / Initial given by admissions     Tommy Medal 07/29/2022, 11:08 AM

## 2022-07-29 NOTE — Inpatient Diabetes Management (Signed)
Inpatient Diabetes Program Recommendations  AACE/ADA: New Consensus Statement on Inpatient Glycemic Control  Target Ranges:  Prepandial:   less than 140 mg/dL      Peak postprandial:   less than 180 mg/dL (1-2 hours)      Critically ill patients:  140 - 180 mg/dL    Latest Reference Range & Units 07/27/22 07:32 07/27/22 17:19 07/28/22 13:30 07/28/22 16:17 07/29/22 07:32  Glucose-Capillary 70 - 99 mg/dL 80 121 (H) 216 (H) 330 (H) 161 (H)    Review of Glycemic Control  Diabetes history: DM2 Outpatient Diabetes medications: Tresiba 12 units daily, Novolog 6 units with breakfast, Novolog 10 units with lunch and supper Current orders for Inpatient glycemic control: Novolog 0-9 units TID with meals  Inpatient Diabetes Program Recommendations:    Insulin: Please consider ordering Novolog 0-5 units QHS and Novolog 3 units TID with meals for meal coverage if patient eats at least 50% of meals.  Thanks, Barnie Alderman, RN, MSN, [place] Diabetes Coordinator Inpatient Diabetes Program [phone] (Team Pager from 8am to [place])

## 2022-07-29 NOTE — Discharge Summary (Addendum)
Physician Discharge Summary  [name] NLZ:[id number] DOB: Jul 30, 1945 DOA: 07/26/2022  PCP: Farrel Gordon, DO  Admit date: 07/26/2022 Discharge date: 07/29/2022  Admitted From: Home Disposition:  Home   Recommendations for Outpatient Follow-up:  Follow up with PCP in 1 week Decreased home Synthroid dose.  Repeat TSH and free T4 in 4 to 6 weeks  Discharge Condition: Stable, improved CODE STATUS: Full code Diet recommendation: Carb modified diet  Brief/Interim Summary: Toni Hill is a 77 year old female with diabetes mellitus type 2 (noncompliant with diet), CVA, GERD, h/o cholecystectomy with chronic CBD dilatation, hypertension, hypothyroidism, hyperlipidemia presented to the hospital with nausea, abd pain, and diarrhea. She has not been eating for few days, checking her sugars or taking her insulin.  She was confused in the ED and it was difficult to obtain a history from her.  She was tachycardic - rate as high as 133, RR as high as 30. UA showed large bacteria > 50 WBC/hpf, small amount of RBC but also squamous cell. CT > b/l non obstruction renal calculi and b/l enlarging cysts.  Patient was started on IV fluids and ceftriaxone.   Patient's urine culture came back contaminant.  She had slow improvement and diet was advanced.  No further nausea, vomiting or diarrhea.  Her TSH was found to be low, free T4 elevated, her home Synthroid dose was adjusted accordingly.   Discharge Diagnoses:  Principal Problem:   Diarrhea in adult patient Active Problems:   Type 2 diabetes mellitus (HCC)   Hyperlipidemia   Bipolar 1 disorder (HCC)   UTI (urinary tract infection)   Right leg weakness   GERD (gastroesophageal reflux disease)   Common bile duct dilatation   Dehydration   Decreased oral intake, diarrhea secondary to suspected gastroenteritis  -Right upper quadrant abdominal ultrasound without obstructing lesion. LFTs within normal limits  -Diarrhea has now resolved -Oral intake  improved, tolerating regular diet this morning   Sepsis secondary to urinary tract infection vs gastroenteritis, present on admission -Urine culture showed contaminant -Continue empiric Rocephin --> fosfomycin   Diabetes mellitus -Sliding scale insulin   Hypothyroidism -Continue synthroid -Decreased TSH. Free T4 elevated -Synthroid dose decreased    Demand ischemia -Troponin 38, 41 -Secondary to sepsis   CKD stage IIIa -Stable   Chronic right leg weakness and immobility -Resides at home with family -Zanaflex, lyrica   HLD -Zocor   Mood disorder  -Zoloft  Chronic left knee pain -Xray: Left total knee arthroplasty without radiographic evidence for complication. No acute abnormality.    Discharge Instructions  Discharge Instructions     Call MD for:  difficulty breathing, headache or visual disturbances   Complete by: As directed    Call MD for:  extreme fatigue   Complete by: As directed    Call MD for:  persistant dizziness or light-headedness   Complete by: As directed    Call MD for:  persistant nausea and vomiting   Complete by: As directed    Call MD for:  severe uncontrolled pain   Complete by: As directed    Call MD for:  temperature >100.4   Complete by: As directed    Diet Carb Modified   Complete by: As directed    Discharge instructions   Complete by: As directed    You were cared for by a hospitalist during your hospital stay. If you have any questions about your discharge medications or the care you received while you were in the hospital after you  are discharged, you can call the unit and ask to speak with the hospitalist on call if the hospitalist that took care of you is not available. Once you are discharged, your primary care physician will handle any further medical issues. Please note that NO REFILLS for any discharge medications will be authorized once you are discharged, as it is imperative that you return to your primary care physician (or  establish a relationship with a primary care physician if you do not have one) for your aftercare needs so that they can reassess your need for medications and monitor your lab values.      Allergies as of 07/29/2022       Reactions   Bactrim [sulfamethoxazole-trimethoprim]    Patient experienced nephrotoxicity in the setting of CKD. Strongly recommend against using this antibiotic in the future.    Cephalexin Itching   Gabapentin    Kidney issue   Morphine And Related Nausea And Vomiting   Vioxx [rofecoxib]    GI bleeding   Penicillins Hives, Itching   Has patient had a PCN reaction causing immediate rash, facial/tongue/throat swelling, SOB or lightheadedness with hypotension: No Has patient had a PCN reaction causing severe rash involving mucus membranes or skin necrosis: No Has patient had a PCN reaction that required hospitalization: no Has patient had a PCN reaction occurring within the last 10 years: No If all of the above answers are "NO", then may proceed with Cephalosporin use.        Medication List     STOP taking these medications    lidocaine 5 % Commonly known as: LIDODERM       TAKE these medications    Accu-Chek Guide test strip Generic drug: glucose blood Check blood sugar up to 4 times per day   Accu-Chek Guide w/Device Kit Check blood sugar 1 times per day.   acetaminophen 500 MG tablet Commonly known as: TYLENOL Take 1,000 mg by mouth in the morning and at bedtime.   B-D ULTRAFINE III SHORT PEN 31G X 8 MM Misc Generic drug: Insulin Pen Needle 1 each by Does not apply route daily. Diagnosis code: E11.9. Used to inject insulin daily.   Calcium 600/Vitamin D3 600-20 MG-MCG Tabs Generic drug: Calcium Carb-Cholecalciferol Take 1 tablet by mouth daily.   diclofenac Sodium 1 % Gel Commonly known as: VOLTAREN APPLY 1 APPLICATION TOPICALLY 4 TIMES A DAY What changed: See the new instructions.   docusate sodium 100 MG capsule Commonly known as:  COLACE Take 100 mg by mouth daily.   ezetimibe 10 MG tablet Commonly known as: ZETIA TAKE 1 TABLET BY MOUTH EVERY DAY IN THE EVENING   insulin aspart 100 UNIT/ML injection Commonly known as: novoLOG Inject 6 Units into the skin daily with breakfast AND 10 Units daily with lunch AND 10 Units daily with supper.   ketoconazole 2 % cream Commonly known as: NIZORAL Apply 1 Application topically daily.   levothyroxine 100 MCG tablet Commonly known as: Synthroid Take 1 tablet (100 mcg total) by mouth daily before breakfast. What changed:  medication strength how much to take additional instructions   OneTouch Delica Lancets 35K Misc Check blood sugar 3 times a day   Accu-Chek Softclix Lancets lancets Use to check blood sugar up to 4 times per day.   pantoprazole 40 MG tablet Commonly known as: PROTONIX TAKE 1 TABLET BY MOUTH EVERY DAY   pregabalin 150 MG capsule Commonly known as: LYRICA TAKE 1 CAPSULE BY MOUTH TWO TIMES A DAY  sertraline 100 MG tablet Commonly known as: ZOLOFT TAKE 2 TABLETS BY MOUTH EVERY DAY   simvastatin 40 MG tablet Commonly known as: ZOCOR TAKE 1 TABLET BY MOUTH EVERY DAY   tiZANidine 4 MG tablet Commonly known as: ZANAFLEX TAKE 1 TABLET (4 MG TOTAL) BY MOUTH IN THE MORNING AND AT BEDTIME. What changed: See the new instructions.   Tyler Aas FlexTouch 100 UNIT/ML FlexTouch Pen Generic drug: insulin degludec Inject 12 Units into the skin daily.   VITAMIN E PO Take 400 Units by mouth 2 (two) times daily.        Follow-up Information     Farrel Gordon, DO Follow up.   Specialty: Internal Medicine Contact information: [place] [address] [phone]                Allergies  Allergen Reactions   Bactrim [Sulfamethoxazole-Trimethoprim]     Patient experienced nephrotoxicity in the setting of CKD. Strongly recommend against using this antibiotic in the future.    Cephalexin Itching   Gabapentin     Kidney  issue   Morphine And Related Nausea And Vomiting   Vioxx [Rofecoxib]     GI bleeding   Penicillins Hives and Itching    Has patient had a PCN reaction causing immediate rash, facial/tongue/throat swelling, SOB or lightheadedness with hypotension: No Has patient had a PCN reaction causing severe rash involving mucus membranes or skin necrosis: No Has patient had a PCN reaction that required hospitalization: no Has patient had a PCN reaction occurring within the last 10 years: No If all of the above answers are "NO", then may proceed with Cephalosporin use.       Procedures/Studies: DG Knee 1-2 Views Left  Result Date: 07/29/2022 CLINICAL DATA:  Chronic left knee pain and joint stiffness. EXAM: LEFT KNEE - 2 VIEW COMPARISON:  Two-view left knee radiograph 10/22/2021 FINDINGS: Left total knee arthroplasty noted. Components are well seated. Joint spacer is intact on the lateral view. No evidence for loosening. No significant joint effusion is present. No acute or healing fractures are present. IMPRESSION: Left total knee arthroplasty without radiographic evidence for complication. No acute abnormality. Electronically Signed   By: San Morelle M.D.   On: 07/29/2022 11:23   US Abdomen Limited RUQ (LIVER/GB)  Result Date: 07/27/2022 CLINICAL DATA:  Dilated bile duct EXAM: ULTRASOUND ABDOMEN LIMITED RIGHT UPPER QUADRANT COMPARISON:  CT abdomen/pelvis 1 day prior FINDINGS: Gallbladder: Surgically absent. Common bile duct: Diameter: 5 mm Liver: Evaluation of the left hepatic lobe is suboptimal due to bowel gas. No focal lesion is seen in the imaged liver. There is mild intrahepatic biliary ductal dilatation. Portal vein is patent on color Doppler imaging with normal direction of blood flow towards the liver. Other: None. IMPRESSION: Mild intrahepatic biliary ductal dilation with no obstructing lesion seen. Findings may be due to the history of cholecystectomy. Correlate with LFTs. Electronically  Signed   By: Valetta Mole M.D.   On: 07/27/2022 09:40   DG Chest Portable 1 View  Result Date: 07/27/2022 CLINICAL DATA:  Nausea and abdominal pain. EXAM: PORTABLE CHEST 1 VIEW COMPARISON:  July 02, 2019 FINDINGS: The heart size and mediastinal contours are within normal limits. There is marked severity calcification of the aortic arch. Low lung volumes are noted. Mild linear atelectasis is seen within the perihilar region on the right. There is mild, stable elevation of the right hemidiaphragm. There is no evidence of a pleural effusion or pneumothorax. Radiopaque surgical clips are  seen within the right upper quadrant. A chronic fracture deformity is seen involving the surgical neck of the proximal left humerus. Multilevel degenerative changes are seen throughout the thoracic spine. IMPRESSION: Low lung volumes with mild right perihilar linear atelectasis. Electronically Signed   By: Virgina Norfolk M.D.   On: 07/27/2022 01:15   CT ABDOMEN PELVIS W CONTRAST  Result Date: 07/26/2022 CLINICAL DATA:  Acute nonlocalized abdominal pain EXAM: CT ABDOMEN AND PELVIS WITH CONTRAST TECHNIQUE: Multidetector CT imaging of the abdomen and pelvis was performed using the standard protocol following bolus administration of intravenous contrast. RADIATION DOSE REDUCTION: This exam was performed according to the departmental dose-optimization program which includes automated exposure control, adjustment of the mA and/or kV according to patient size and/or use of iterative reconstruction technique. CONTRAST:  61m OMNIPAQUE IOHEXOL 300 MG/ML SOLN IV. No oral contrast. COMPARISON:  12/17/2020 FINDINGS: Lower chest: Subsegmental atelectasis BILATERAL lower lobes. Hepatobiliary: Post cholecystectomy. Mild intrahepatic and extrahepatic biliary dilatation slightly increased from previous exam. No focal hepatic mass. Pancreas: Atrophic pancreas without mass Spleen: Mass normal appearance Adrenals/Urinary Tract: RIGHT adrenal  gland normal. LEFT adrenal mass 2.0 x 1.7 cm, measuring 49 HU; on prior noncontrast exam this measured 0 HU consistent with lipid rich adenoma; no follow-up imaging recommended. Numerous BILATERAL renal cysts low to intermediate attenuation, largest lesion anterior RIGHT kidney 4.7 x 3.9 cm image 36 previously 6.3 x 5.7 cm. These are increased in size and number since 11/20/2016. Scattered nonobstructing renal calculi. Tiny calculus within urinary bladder dependently. No hydronephrosis or hydroureter. No ureteral calcifications. Bladder otherwise unremarkable. Stomach/Bowel: Colonic diverticulosis primarily at sigmoid colon. No CT evidence of diverticulitis. Normal appendix. Stomach and bowel loops otherwise normal appearance. Vascular/Lymphatic: Atherosclerotic calcifications aorta and iliac arteries without aneurysm. Scattered pelvic phleboliths. No adenopathy. Reproductive: Uterus surgically absent.  Normal sized ovaries. Other: No free air or free fluid. No hernia. Linear high attenuation focus in the anterior mid abdomen supraumbilical appears unchanged question sequela of patient's reported prior ventral hernia repair. No inflammatory process. Musculoskeletal: Osseous demineralization. Probable vertebral hemangioma L4 unchanged. IMPRESSION: Colonic diverticulosis without evidence of diverticulitis. Multiple BILATERAL nonobstructing renal calculi. Tiny calculus within urinary bladder dependently. Numerous BILATERAL renal cysts low to intermediate attenuation, increased in size and number since 11/20/2016; follow-up characterization by non emergent renal protocol MRI or CT with and without contrast recommended to assess indeterminate lesions. Post cholecystectomy with mild intrahepatic and extrahepatic biliary dilatation, slightly increased from previous exam, recommend correlation with LFTs. Stable LEFT adrenal adenoma. No acute intra-abdominal or intrapelvic abnormalities. Aortic Atherosclerosis (ICD10-I70.0).  Electronically Signed   By: MLavonia DanaM.D.   On: 07/26/2022 20:10       Discharge Exam: Vitals:   07/28/22 2024 07/29/22 0436  BP: (!) 111/46 (!) 119/39  Pulse: 77 71  Resp: 19 12  Temp: 98.3 F (36.8 C) 97.6 F (36.4 C)  SpO2: 99% 99%    General: Pt is alert, awake, not in acute distress Cardiovascular: S1/S2 +, no edema Respiratory: CTA bilaterally, no wheezing, no rhonchi, no respiratory distress, no conversational dyspnea  Abdominal: Soft, NT, ND, bowel sounds + Extremities: no edema, no cyanosis, left knee without erythrma  Psych: Normal mood and affect, stable judgement and insight     The results of significant diagnostics from this hospitalization (including imaging, microbiology, ancillary and laboratory) are listed below for reference.     Microbiology: Recent Results (from the past 240 hour(s))  Urine Culture     Status: Abnormal   Collection  Time: 07/26/22 11:58 PM   Specimen: Urine, Clean Catch  Result Value Ref Range Status   Specimen Description   Final    URINE, CLEAN CATCH Performed at [facility], [address], [place], [place] [address]    Special Requests   Final    NONE Performed at [facility], [address], [place], [place] [address]    Culture MULTIPLE SPECIES PRESENT, SUGGEST RECOLLECTION (A)  Final   Report Status 07/28/2022 FINAL  Final     Labs: BNP (last 3 results) No results for input(s): "BNP" in the last 8760 hours. Basic Metabolic Panel: Recent Labs  Lab 07/26/22 1655 07/26/22 1658 07/27/22 0459 07/29/22 0451  NA 140 139 139 141  K 4.7 4.7 4.0 3.7  CL 104 101 107 109  CO2  --  '25 25 26  '$ GLUCOSE 193* 201* 108* 191*  BUN 27* 27* 24* 17  CREATININE 0.90 1.18* 0.99 1.17*  CALCIUM  --  9.8 8.8* 8.3*  MG  --  2.0 1.7  --    Liver Function Tests: Recent Labs  Lab 07/26/22 1658 07/27/22 0459  AST 27 22  ALT 16 13  ALKPHOS 65 49  BILITOT 0.8 0.5  PROT 7.3 5.7*  ALBUMIN 3.9 3.0*   Recent Labs  Lab  07/26/22 1658  LIPASE 25   No results for input(s): "AMMONIA" in the last 168 hours. CBC: Recent Labs  Lab 07/26/22 1655 07/26/22 1658  WBC  --  9.4  NEUTROABS  --  7.9*  HGB 15.0 14.0  HCT 44.0 44.3  MCV  --  97.8  PLT  --  244   Cardiac Enzymes: No results for input(s): "CKTOTAL", "CKMB", "CKMBINDEX", "TROPONINI" in the last 168 hours. BNP: Invalid input(s): "POCBNP" CBG: Recent Labs  Lab 07/27/22 0732 07/27/22 1719 07/28/22 1330 07/28/22 1617 07/29/22 0732  GLUCAP 80 121* 216* 330* 161*   D-Dimer No results for input(s): "DDIMER" in the last 72 hours. Hgb A1c No results for input(s): "HGBA1C" in the last 72 hours. Lipid Profile No results for input(s): "CHOL", "HDL", "LDLCALC", "TRIG", "CHOLHDL", "LDLDIRECT" in the last 72 hours. Thyroid function studies Recent Labs    07/27/22 0459  TSH 0.230*   Anemia work up No results for input(s): "VITAMINB12", "FOLATE", "FERRITIN", "TIBC", "IRON", "RETICCTPCT" in the last 72 hours. Urinalysis    Component Value Date/Time   COLORURINE YELLOW 07/26/2022 2358   APPEARANCEUR CLOUDY (A) 07/26/2022 2358   APPEARANCEUR Cloudy (A) 02/03/2021 1025   LABSPEC 1.040 (H) 07/26/2022 2358   PHURINE 5.0 07/26/2022 2358   GLUCOSEU 50 (A) 07/26/2022 2358   HGBUR SMALL (A) 07/26/2022 2358   BILIRUBINUR NEGATIVE 07/26/2022 2358   BILIRUBINUR Negative 02/03/2021 1025   KETONESUR 20 (A) 07/26/2022 2358   PROTEINUR 30 (A) 07/26/2022 2358   UROBILINOGEN 0.2 04/03/2019 1609   NITRITE NEGATIVE 07/26/2022 2358   LEUKOCYTESUR LARGE (A) 07/26/2022 2358   Sepsis Labs Recent Labs  Lab 07/26/22 1658  WBC 9.4   Microbiology Recent Results (from the past 240 hour(s))  Urine Culture     Status: Abnormal   Collection Time: 07/26/22 11:58 PM   Specimen: Urine, Clean Catch  Result Value Ref Range Status   Specimen Description   Final    URINE, CLEAN CATCH Performed at [facility], [address], [place], [place] [address]    Special  Requests   Final    NONE Performed at [facility], [address], [place],  [address]  Culture MULTIPLE SPECIES PRESENT, SUGGEST RECOLLECTION (A)  Final   Report Status 07/28/2022 FINAL  Final     Patient was seen and examined on the day of discharge and was found to be in stable condition. Time coordinating discharge: 25 minutes including assessment and coordination of care, as well as examination of the patient.   SIGNED:  Dessa Phi, DO [facility] 07/29/2022, 11:30 AM

## 2022-07-29 NOTE — Progress Notes (Signed)
Discharge paperwork reviewed with patient and sister, Jana Half, verbalized understanding. IV removed, Jana Half leaving to go home as EMS to be called to transport patient home. Secretary, Inez Catalina, made aware and to call transport. Purewick to be removed upon EMS arrival.

## 2022-07-30 NOTE — Progress Notes (Signed)
Patient discharged home via EMS. VSS. Belongings sent with patient.

## 2022-07-31 ENCOUNTER — Other Ambulatory Visit: Payer: Self-pay

## 2022-07-31 ENCOUNTER — Emergency Department (HOSPITAL_COMMUNITY)
Admission: EM | Admit: 2022-07-31 | Discharge: 2022-08-01 | Disposition: A | Discharge status: Home / Self Care | Payer: No Typology Code available for payment source | Attending: Emergency Medicine | Admitting: Emergency Medicine

## 2022-07-31 ENCOUNTER — Emergency Department (HOSPITAL_COMMUNITY): Payer: No Typology Code available for payment source

## 2022-07-31 ENCOUNTER — Encounter (HOSPITAL_COMMUNITY): Payer: Self-pay | Admitting: Emergency Medicine

## 2022-07-31 ENCOUNTER — Telehealth: Payer: Self-pay | Admitting: Internal Medicine

## 2022-07-31 DIAGNOSIS — R1013 Epigastric pain: Secondary | ICD-10-CM | POA: Diagnosis not present

## 2022-07-31 DIAGNOSIS — I129 Hypertensive chronic kidney disease with stage 1 through stage 4 chronic kidney disease, or unspecified chronic kidney disease: Secondary | ICD-10-CM | POA: Insufficient documentation

## 2022-07-31 DIAGNOSIS — Z794 Long term (current) use of insulin: Secondary | ICD-10-CM | POA: Insufficient documentation

## 2022-07-31 DIAGNOSIS — Z79899 Other long term (current) drug therapy: Secondary | ICD-10-CM | POA: Insufficient documentation

## 2022-07-31 DIAGNOSIS — N200 Calculus of kidney: Secondary | ICD-10-CM | POA: Diagnosis not present

## 2022-07-31 DIAGNOSIS — J9811 Atelectasis: Secondary | ICD-10-CM | POA: Diagnosis not present

## 2022-07-31 DIAGNOSIS — M533 Sacrococcygeal disorders, not elsewhere classified: Secondary | ICD-10-CM | POA: Diagnosis not present

## 2022-07-31 DIAGNOSIS — E1122 Type 2 diabetes mellitus with diabetic chronic kidney disease: Secondary | ICD-10-CM | POA: Diagnosis not present

## 2022-07-31 DIAGNOSIS — N189 Chronic kidney disease, unspecified: Secondary | ICD-10-CM | POA: Diagnosis not present

## 2022-07-31 DIAGNOSIS — N281 Cyst of kidney, acquired: Secondary | ICD-10-CM | POA: Diagnosis not present

## 2022-07-31 DIAGNOSIS — R1010 Upper abdominal pain, unspecified: Secondary | ICD-10-CM | POA: Diagnosis present

## 2022-07-31 DIAGNOSIS — I10 Essential (primary) hypertension: Secondary | ICD-10-CM | POA: Diagnosis not present

## 2022-07-31 DIAGNOSIS — R1084 Generalized abdominal pain: Secondary | ICD-10-CM | POA: Diagnosis not present

## 2022-07-31 DIAGNOSIS — R1032 Left lower quadrant pain: Secondary | ICD-10-CM | POA: Diagnosis not present

## 2022-07-31 LAB — URINALYSIS, W/ REFLEX TO CULTURE (INFECTION SUSPECTED)
Bacteria, UA: NONE SEEN
Bilirubin Urine: NEGATIVE
Glucose, UA: 50 mg/dL — AB
Ketones, ur: NEGATIVE mg/dL
Nitrite: NEGATIVE
Protein, ur: NEGATIVE mg/dL
Specific Gravity, Urine: 1.01 (ref 1.005–1.030)
pH: 6 (ref 5.0–8.0)

## 2022-07-31 LAB — CBC WITH DIFFERENTIAL/PLATELET
Abs Immature Granulocytes: 0.02 10*3/uL (ref 0.00–0.07)
Basophils Absolute: 0 10*3/uL (ref 0.0–0.1)
Basophils Relative: 0 %
Eosinophils Absolute: 0 10*3/uL (ref 0.0–0.5)
Eosinophils Relative: 0 %
HCT: 42 % (ref 36.0–46.0)
Hemoglobin: 13.6 g/dL (ref 12.0–15.0)
Immature Granulocytes: 0 %
Lymphocytes Relative: 22 %
Lymphs Abs: 1.5 10*3/uL (ref 0.7–4.0)
MCH: 31.3 pg (ref 26.0–34.0)
MCHC: 32.4 g/dL (ref 30.0–36.0)
MCV: 96.8 fL (ref 80.0–100.0)
Monocytes Absolute: 0.5 10*3/uL (ref 0.1–1.0)
Monocytes Relative: 8 %
Neutro Abs: 4.7 10*3/uL (ref 1.7–7.7)
Neutrophils Relative %: 70 %
Platelets: 243 10*3/uL (ref 150–400)
RBC: 4.34 MIL/uL (ref 3.87–5.11)
RDW: 12.7 % (ref 11.5–15.5)
WBC: 6.7 10*3/uL (ref 4.0–10.5)
nRBC: 0 % (ref 0.0–0.2)

## 2022-07-31 LAB — COMPREHENSIVE METABOLIC PANEL
ALT: 19 U/L (ref 0–44)
AST: 36 U/L (ref 15–41)
Albumin: 3.5 g/dL (ref 3.5–5.0)
Alkaline Phosphatase: 61 U/L (ref 38–126)
Anion gap: 10 (ref 5–15)
BUN: 13 mg/dL (ref 8–23)
CO2: 26 mmol/L (ref 22–32)
Calcium: 9.4 mg/dL (ref 8.9–10.3)
Chloride: 105 mmol/L (ref 98–111)
Creatinine, Ser: 0.92 mg/dL (ref 0.44–1.00)
GFR, Estimated: >60 mL/min (ref >60)
Glucose, Bld: 187 mg/dL — ABNORMAL HIGH (ref 70–99)
Potassium: 4.1 mmol/L (ref 3.5–5.1)
Sodium: 141 mmol/L (ref 135–145)
Total Bilirubin: 0.5 mg/dL (ref 0.3–1.2)
Total Protein: 6.5 g/dL (ref 6.5–8.1)

## 2022-07-31 LAB — TROPONIN I (HIGH SENSITIVITY): Troponin I (High Sensitivity): 15 ng/L (ref <18)

## 2022-07-31 LAB — LIPASE, BLOOD: Lipase: 28 U/L (ref 11–51)

## 2022-07-31 MED ORDER — ACETAMINOPHEN 325 MG PO TABS
650.0000 mg | ORAL_TABLET | Freq: Once | ORAL | Status: AC
  Administered 2022-07-31: 650 mg via ORAL
  Filled 2022-07-31: qty 2

## 2022-07-31 MED ORDER — LACTATED RINGERS IV BOLUS
1000.0000 mL | Freq: Once | INTRAVENOUS | Status: AC
  Administered 2022-07-31: 1000 mL via INTRAVENOUS

## 2022-07-31 MED ORDER — IOHEXOL 300 MG/ML  SOLN
100.0000 mL | Freq: Once | INTRAMUSCULAR | Status: AC | PRN
  Administered 2022-07-31: 100 mL via INTRAVENOUS

## 2022-07-31 NOTE — ED Notes (Signed)
[facility] called to transport patient home. Nurse notified.

## 2022-07-31 NOTE — ED Notes (Signed)
Pt called out to nurses station, nurse to room, pt says she is hot now, was cold 45 minutes ago, nurse turned down heat in pt room and laid blankets over side rail so pt could reach if she gets cold again.

## 2022-07-31 NOTE — Telephone Encounter (Signed)
Received after hours page from this patient. Attempted to call multiple times, but no answer.

## 2022-07-31 NOTE — Discharge Instructions (Signed)
If you develop new or worsening pain, fever, vomiting, or any other new/concerning symptoms and return to the ER or call 911.

## 2022-07-31 NOTE — ED Triage Notes (Addendum)
Patient brought in via EMS from home. Alert and oriented. Airway patent. Patient c/o lower left abd pain. Patient recently admitted for nausea x1 weeks ago. Patient reports nausea without vomiting. Denies any diarrhea or fevers. Per patient some dysuria. Patient also c/o right toe pain with great toe being the worse. Per patient neuropathy but toes are painful to the touch.

## 2022-07-31 NOTE — ED Provider Notes (Signed)
[facility] Provider Note   CSN: [id number] Arrival date & time: 07/31/22  1633     History  Chief Complaint  Patient presents with   Abdominal Pain    Toni Hill is a 77 y.o. female.  HPI 76 year old female with a history of multiple comorbidities which include type 2 diabetes, hypertension, bipolar disorder, CKD, paraplegia resulting in her being bedbound and recent admission presents with not feeling well.  She has chronic nausea that seems to be continued.  No vomiting.  Is having upper abdominal pain.  She and caregiver also noting that she has been complaining of buttocks pain but this has been a chronic finding.  Patient is bedbound and often has pain when she is trying to get on the bedpan right over her coccyx.  No fevers, patient feels a little short of breath but it seems like this is more chronic and denies any chest pain.  She just keeps repeating that she does not feel well.  She was discharged yesterday morning.  When EMS picked her up, family/caregiver stated that the patient's blood pressure was in the 200s and she is not on anything for hypertension. Patient tells me her buttock pain is both coccyx but also rectum. No bleeding. Had diarrhea that has resolved. She also mentions right great toe pain that is chronic for 2+ years, but this is unchanged.  Home Medications Prior to Admission medications   Medication Sig Start Date End Date Taking? Authorizing Provider  Accu-Chek Softclix Lancets lancets Use to check blood sugar up to 4 times per day. 03/05/22  Yes Farrel Gordon, DO  acetaminophen (TYLENOL) 500 MG tablet Take 1,000 mg by mouth in the morning and at bedtime.   Yes [provider]  Calcium Carb-Cholecalciferol (CALCIUM 600/VITAMIN D3) 600-800 MG-UNIT TABS Take 1 tablet by mouth daily.    Yes [provider]  diclofenac Sodium (VOLTAREN) 1 % GEL APPLY 1 APPLICATION TOPICALLY 4 TIMES A DAY Patient taking  differently: Apply 2 g topically 4 (four) times daily. 05/10/22  Yes Farrel Gordon, DO  ezetimibe (ZETIA) 10 MG tablet TAKE 1 TABLET BY MOUTH EVERY DAY IN THE EVENING 07/14/22  Yes Farrel Gordon, DO  insulin aspart (NOVOLOG) 100 UNIT/ML injection Inject 6 Units into the skin daily with breakfast AND 10 Units daily with lunch AND 10 Units daily with supper. 02/10/22  Yes Farrel Gordon, DO  insulin degludec (TRESIBA FLEXTOUCH) 100 UNIT/ML FlexTouch Pen Inject 12 Units into the skin daily. 02/10/22  Yes Farrel Gordon, DO  levothyroxine (SYNTHROID) 100 MCG tablet Take 1 tablet (100 mcg total) by mouth daily before breakfast. 07/29/22 10/27/22 Yes Dessa Phi, DO  pantoprazole (PROTONIX) 40 MG tablet TAKE 1 TABLET BY MOUTH EVERY DAY Patient taking differently: Take 40 mg by mouth daily. 07/14/22  Yes Farrel Gordon, DO  pregabalin (LYRICA) 150 MG capsule TAKE 1 CAPSULE BY MOUTH TWO TIMES A DAY 03/10/22  Yes Farrel Gordon, DO  sertraline (ZOLOFT) 100 MG tablet TAKE 2 TABLETS BY MOUTH EVERY DAY 05/04/22  Yes Farrel Gordon, DO  simvastatin (ZOCOR) 40 MG tablet TAKE 1 TABLET BY MOUTH EVERY DAY 07/26/22  Yes Farrel Gordon, DO  tiZANidine (ZANAFLEX) 4 MG tablet Take 1 tablet (4 mg total) by mouth at bedtime. Patient taking differently: Take 4 mg by mouth See admin instructions. Take 4 mg in the morning and 4 mg at bedtime 07/29/22 10/27/22 Yes Dessa Phi, DO  VITAMIN E PO Take 400 Units  by mouth 2 (two) times daily.   Yes [provider]  Blood Glucose Monitoring Suppl (ACCU-CHEK GUIDE) w/Device KIT Check blood sugar 1 times per day. 02/10/22   Farrel Gordon, DO  docusate sodium (COLACE) 100 MG capsule Take 100 mg by mouth daily. Patient not taking: Reported on 07/31/2022    [provider]  glucose blood (ACCU-CHEK GUIDE) test strip Check blood sugar up to 4 times per day 03/05/22   Farrel Gordon, DO  Insulin Pen Needle (B-D ULTRAFINE III SHORT PEN) 31G X 8 MM MISC 1 each by Does not apply route daily. Diagnosis code:  E11.9. Used to inject insulin daily. 12/03/21   Maudie Mercury, MD  ketoconazole (NIZORAL) 2 % cream Apply 1 Application topically daily. Patient not taking: Reported on 07/31/2022 02/10/22   Farrel Gordon, DO  OneTouch Delica Lancets 42V MISC Check blood sugar 3 times a day 03/14/20   Maudie Mercury, MD      Allergies    Bactrim [sulfamethoxazole-trimethoprim], Cephalexin, Gabapentin, Morphine and related, Vioxx [rofecoxib], and Penicillins    Review of Systems   Review of Systems  Respiratory:  Positive for shortness of breath.   Cardiovascular:  Negative for chest pain.  Gastrointestinal:  Positive for abdominal pain, nausea and rectal pain.  Neurological:  Negative for headaches.    Physical Exam Updated Vital Signs BP (!) 147/66   Pulse 83   Temp 98.2 F (36.8 C) (Oral)   Resp 18   Ht '5\' 9"'$  (1.753 m)   Wt 74.8 kg   SpO2 98%   BMI 24.37 kg/m  Physical Exam Vitals and nursing note reviewed. Exam conducted with a chaperone present.  Constitutional:      Appearance: She is well-developed.  HENT:     Head: Normocephalic and atraumatic.  Cardiovascular:     Rate and Rhythm: Normal rate and regular rhythm.     Heart sounds: Normal heart sounds.  Pulmonary:     Effort: Pulmonary effort is normal.     Breath sounds: Normal breath sounds. No wheezing.  Abdominal:     Palpations: Abdomen is soft.     Tenderness: There is abdominal tenderness in the epigastric area.  Genitourinary:    Rectum: Tenderness present.     Comments: Patient has no gross blood, melena or obvious hemorrhoids on digital exam. Musculoskeletal:       Back:  Skin:    General: Skin is warm and dry.  Neurological:     Mental Status: She is alert.     ED Results / Procedures / Treatments   Labs (all labs ordered are listed, but only abnormal results are displayed) Labs Reviewed  COMPREHENSIVE METABOLIC PANEL - Abnormal; Notable for the following components:      Result Value   Glucose, Bld 187 (*)     All other components within normal limits  URINALYSIS, W/ REFLEX TO CULTURE (INFECTION SUSPECTED) - Abnormal; Notable for the following components:   Color, Urine STRAW (*)    Glucose, UA 50 (*)    Hgb urine dipstick SMALL (*)    Leukocytes,Ua TRACE (*)    All other components within normal limits  CBC WITH DIFFERENTIAL/PLATELET  LIPASE, BLOOD  TROPONIN I (HIGH SENSITIVITY)    EKG EKG Interpretation  Date/Time:  Saturday July 31 2022 18:31:02 EST Ventricular Rate:  86 PR Interval:  146 QRS Duration: 146 QT Interval:  412 QTC Calculation: 493 R Axis:   99 Text Interpretation: Sinus rhythm Multiform ventricular premature complexes RBBB  and LPFB Confirmed by Sherwood Gambler [phone]) on 07/31/2022 7:33:52 PM  Radiology CT ABDOMEN PELVIS W CONTRAST  Result Date: 07/31/2022 CLINICAL DATA:  Left lower quadrant abdominal pain. Nausea. Some dysuria. EXAM: CT ABDOMEN AND PELVIS WITH CONTRAST TECHNIQUE: Multidetector CT imaging of the abdomen and pelvis was performed using the standard protocol following bolus administration of intravenous contrast. RADIATION DOSE REDUCTION: This exam was performed according to the departmental dose-optimization program which includes automated exposure control, adjustment of the mA and/or kV according to patient size and/or use of iterative reconstruction technique. CONTRAST:  131m OMNIPAQUE IOHEXOL 300 MG/ML  SOLN COMPARISON:  07/26/2022 FINDINGS: Lower chest: The heart remains enlarged and displaced into the left hemithorax. The other mediastinal structures remain shifted to the left. Interval minimal bilateral pleural fluid and mild bilateral dependent atelectasis. Hepatobiliary: Stable cholecystectomy clips and extrahepatic and intrahepatic biliary ductal dilatation. Pancreas: Diffuse pancreatic atrophy without significant change. Spleen: Normal in size without focal abnormality. Adrenals/Urinary Tract: A left adrenal nodule is unchanged, measuring 2.0 x  1.4 cm. Normal-appearing right adrenal gland. A large number of bilateral renal cysts are again demonstrated. Tiny right renal calculus. These do not need imaging follow-up. Unremarkable ureters and urinary bladder. Stomach/Bowel: Multiple sigmoid and descending colon diverticula without evidence of diverticulitis. The appendix is not visualized. Unremarkable stomach and small bowel. Vascular/Lymphatic: Atheromatous arterial calcifications without aneurysm. No enlarged lymph nodes. Reproductive: Status post hysterectomy. No adnexal masses. Other: No abdominal wall hernia or abnormality. No abdominopelvic ascites. Musculoskeletal: Lumbar and lower thoracic degenerative changes with changes of DISH. IMPRESSION: 1. No acute abnormality. 2. Colonic diverticulosis without evidence of diverticulitis. 3. Interval minimal bilateral pleural fluid and mild bilateral dependent atelectasis. 4. Stable cardiomegaly and mediastinal shift to the left. 5. Stable left adrenal adenoma. Electronically Signed   By: SClaudie ReveringM.D.   On: 07/31/2022 19:30   DG Chest Portable 1 View  Result Date: 07/31/2022 CLINICAL DATA:  Dyspnea. EXAM: PORTABLE CHEST 1 VIEW COMPARISON:  July 27, 2022. FINDINGS: The heart size and mediastinal contours are within normal limits. Left lung is clear. Elevated right hemidiaphragm is noted with minimal right perihilar subsegmental atelectasis. The visualized skeletal structures are unremarkable. IMPRESSION: Elevated right hemidiaphragm with minimal right perihilar subsegmental atelectasis. Electronically Signed   By: JMarijo ConceptionM.D.   On: 07/31/2022 17:54    Procedures Procedures    Medications Ordered in ED Medications  lactated ringers bolus 1,000 mL (0 mLs Intravenous Stopped 07/31/22 2011)  iohexol (OMNIPAQUE) 300 MG/ML solution 100 mL (100 mLs Intravenous Contrast Given 07/31/22 1859)  acetaminophen (TYLENOL) tablet 650 mg (650 mg Oral Given 07/31/22 2014)    ED Course/ Medical  Decision Making/ A&P                             Medical Decision Making Amount and/or Complexity of Data Reviewed Labs: ordered.    Details: Questionable UA but no overt UTI.  Normal WBC.  No significant electrolyte disturbance.  Mild hyperglycemia.  No AKI. Radiology: ordered and independent interpretation performed.    Details: CT without obstruction or acute infection.  Risk OTC drugs. Prescription drug management.   Patient presents to the ED after being discharged just over 24 hours ago.  Discharge summary reviewed.  At this point workup was obtained and I am not sure why exactly she has the coccyx pain but probably from being bedbound and sitting on the bedpan multiple times.  However there  is no obvious skin breakdown.  CT is unremarkable including in her GI system and rectum.  At this point, I do not have a reason to admit her to the hospital again and she has not had any vomiting despite her complaint of nausea.  Seems stable for discharge home to follow-up with her PCP.  Will give return precautions.        Final Clinical Impression(s) / ED Diagnoses Final diagnoses:  Coccyx pain    Rx / DC Orders ED Discharge Orders     None         Sherwood Gambler, MD 07/31/22 2319

## 2022-07-31 NOTE — ED Notes (Signed)
Pt a/w EMS transport home, pt sister has left, explained that EMS would probably not be able to transport until the am, explained to pt sister I would call her when they arrive, as pt lives with sister. Pt sister was assisted to her car in [facility] by security, Herb as she had parked in main parking lot.

## 2022-08-02 ENCOUNTER — Telehealth: Payer: Self-pay

## 2022-08-02 ENCOUNTER — Inpatient Hospital Stay (HOSPITAL_COMMUNITY)
Admission: EM | Admit: 2022-08-02 | Discharge: 2022-08-07 | DRG: 392 | Disposition: A | Discharge status: Home / Self Care | Payer: No Typology Code available for payment source | Attending: Internal Medicine | Admitting: Internal Medicine

## 2022-08-02 ENCOUNTER — Other Ambulatory Visit: Payer: Self-pay

## 2022-08-02 DIAGNOSIS — K5909 Other constipation: Secondary | ICD-10-CM | POA: Diagnosis present

## 2022-08-02 DIAGNOSIS — D649 Anemia, unspecified: Secondary | ICD-10-CM | POA: Diagnosis present

## 2022-08-02 DIAGNOSIS — E1122 Type 2 diabetes mellitus with diabetic chronic kidney disease: Secondary | ICD-10-CM | POA: Diagnosis present

## 2022-08-02 DIAGNOSIS — I452 Bifascicular block: Secondary | ICD-10-CM | POA: Diagnosis present

## 2022-08-02 DIAGNOSIS — N1831 Chronic kidney disease, stage 3a: Secondary | ICD-10-CM | POA: Diagnosis present

## 2022-08-02 DIAGNOSIS — K219 Gastro-esophageal reflux disease without esophagitis: Secondary | ICD-10-CM | POA: Diagnosis present

## 2022-08-02 DIAGNOSIS — I129 Hypertensive chronic kidney disease with stage 1 through stage 4 chronic kidney disease, or unspecified chronic kidney disease: Secondary | ICD-10-CM | POA: Diagnosis present

## 2022-08-02 DIAGNOSIS — R110 Nausea: Secondary | ICD-10-CM | POA: Diagnosis not present

## 2022-08-02 DIAGNOSIS — M797 Fibromyalgia: Secondary | ICD-10-CM | POA: Diagnosis present

## 2022-08-02 DIAGNOSIS — E782 Mixed hyperlipidemia: Secondary | ICD-10-CM | POA: Diagnosis present

## 2022-08-02 DIAGNOSIS — R109 Unspecified abdominal pain: Secondary | ICD-10-CM | POA: Insufficient documentation

## 2022-08-02 DIAGNOSIS — E119 Type 2 diabetes mellitus without complications: Secondary | ICD-10-CM | POA: Diagnosis not present

## 2022-08-02 DIAGNOSIS — Z888 Allergy status to other drugs, medicaments and biological substances status: Secondary | ICD-10-CM

## 2022-08-02 DIAGNOSIS — Z880 Allergy status to penicillin: Secondary | ICD-10-CM

## 2022-08-02 DIAGNOSIS — R000 Tachycardia, unspecified: Secondary | ICD-10-CM | POA: Diagnosis present

## 2022-08-02 DIAGNOSIS — E039 Hypothyroidism, unspecified: Secondary | ICD-10-CM | POA: Diagnosis present

## 2022-08-02 DIAGNOSIS — Z8616 Personal history of COVID-19: Secondary | ICD-10-CM

## 2022-08-02 DIAGNOSIS — R1010 Upper abdominal pain, unspecified: Secondary | ICD-10-CM | POA: Diagnosis not present

## 2022-08-02 DIAGNOSIS — E1165 Type 2 diabetes mellitus with hyperglycemia: Secondary | ICD-10-CM | POA: Diagnosis present

## 2022-08-02 DIAGNOSIS — Z9049 Acquired absence of other specified parts of digestive tract: Secondary | ICD-10-CM

## 2022-08-02 DIAGNOSIS — Z96652 Presence of left artificial knee joint: Secondary | ICD-10-CM | POA: Diagnosis present

## 2022-08-02 DIAGNOSIS — E1142 Type 2 diabetes mellitus with diabetic polyneuropathy: Secondary | ICD-10-CM | POA: Diagnosis present

## 2022-08-02 DIAGNOSIS — F39 Unspecified mood [affective] disorder: Secondary | ICD-10-CM | POA: Insufficient documentation

## 2022-08-02 DIAGNOSIS — Z885 Allergy status to narcotic agent status: Secondary | ICD-10-CM

## 2022-08-02 DIAGNOSIS — M79671 Pain in right foot: Secondary | ICD-10-CM | POA: Diagnosis not present

## 2022-08-02 DIAGNOSIS — E876 Hypokalemia: Secondary | ICD-10-CM | POA: Diagnosis not present

## 2022-08-02 DIAGNOSIS — N39490 Overflow incontinence: Secondary | ICD-10-CM | POA: Diagnosis present

## 2022-08-02 DIAGNOSIS — R1013 Epigastric pain: Secondary | ICD-10-CM | POA: Diagnosis present

## 2022-08-02 DIAGNOSIS — N179 Acute kidney failure, unspecified: Secondary | ICD-10-CM | POA: Diagnosis not present

## 2022-08-02 DIAGNOSIS — Z803 Family history of malignant neoplasm of breast: Secondary | ICD-10-CM

## 2022-08-02 DIAGNOSIS — E860 Dehydration: Secondary | ICD-10-CM | POA: Diagnosis present

## 2022-08-02 DIAGNOSIS — Z79899 Other long term (current) drug therapy: Secondary | ICD-10-CM

## 2022-08-02 DIAGNOSIS — R1084 Generalized abdominal pain: Secondary | ICD-10-CM | POA: Diagnosis not present

## 2022-08-02 DIAGNOSIS — R1033 Periumbilical pain: Secondary | ICD-10-CM | POA: Diagnosis not present

## 2022-08-02 DIAGNOSIS — I10 Essential (primary) hypertension: Secondary | ICD-10-CM | POA: Diagnosis not present

## 2022-08-02 DIAGNOSIS — Z794 Long term (current) use of insulin: Secondary | ICD-10-CM

## 2022-08-02 DIAGNOSIS — R197 Diarrhea, unspecified: Secondary | ICD-10-CM | POA: Diagnosis present

## 2022-08-02 DIAGNOSIS — G8929 Other chronic pain: Secondary | ICD-10-CM | POA: Diagnosis present

## 2022-08-02 DIAGNOSIS — R112 Nausea with vomiting, unspecified: Secondary | ICD-10-CM | POA: Diagnosis present

## 2022-08-02 DIAGNOSIS — F319 Bipolar disorder, unspecified: Secondary | ICD-10-CM | POA: Diagnosis present

## 2022-08-02 DIAGNOSIS — Z881 Allergy status to other antibiotic agents status: Secondary | ICD-10-CM | POA: Diagnosis not present

## 2022-08-02 DIAGNOSIS — M533 Sacrococcygeal disorders, not elsewhere classified: Secondary | ICD-10-CM | POA: Diagnosis present

## 2022-08-02 DIAGNOSIS — Z90710 Acquired absence of both cervix and uterus: Secondary | ICD-10-CM

## 2022-08-02 DIAGNOSIS — Z23 Encounter for immunization: Secondary | ICD-10-CM

## 2022-08-02 DIAGNOSIS — Z79890 Hormone replacement therapy (postmenopausal): Secondary | ICD-10-CM

## 2022-08-02 DIAGNOSIS — Z7401 Bed confinement status: Secondary | ICD-10-CM | POA: Diagnosis not present

## 2022-08-02 DIAGNOSIS — Z882 Allergy status to sulfonamides status: Secondary | ICD-10-CM

## 2022-08-02 DIAGNOSIS — K5900 Constipation, unspecified: Secondary | ICD-10-CM | POA: Diagnosis not present

## 2022-08-02 LAB — CBC WITH DIFFERENTIAL/PLATELET
Abs Immature Granulocytes: 0.02 10*3/uL (ref 0.00–0.07)
Basophils Absolute: 0 10*3/uL (ref 0.0–0.1)
Basophils Relative: 0 %
Eosinophils Absolute: 0 10*3/uL (ref 0.0–0.5)
Eosinophils Relative: 0 %
HCT: 41.1 % (ref 36.0–46.0)
Hemoglobin: 13.2 g/dL (ref 12.0–15.0)
Immature Granulocytes: 0 %
Lymphocytes Relative: 21 %
Lymphs Abs: 1.5 10*3/uL (ref 0.7–4.0)
MCH: 31.3 pg (ref 26.0–34.0)
MCHC: 32.1 g/dL (ref 30.0–36.0)
MCV: 97.4 fL (ref 80.0–100.0)
Monocytes Absolute: 0.6 10*3/uL (ref 0.1–1.0)
Monocytes Relative: 8 %
Neutro Abs: 4.9 10*3/uL (ref 1.7–7.7)
Neutrophils Relative %: 71 %
Platelets: 267 10*3/uL (ref 150–400)
RBC: 4.22 MIL/uL (ref 3.87–5.11)
RDW: 13.1 % (ref 11.5–15.5)
WBC: 7 10*3/uL (ref 4.0–10.5)
nRBC: 0 % (ref 0.0–0.2)

## 2022-08-02 LAB — URINALYSIS, W/ REFLEX TO CULTURE (INFECTION SUSPECTED)
Bilirubin Urine: NEGATIVE
Glucose, UA: NEGATIVE mg/dL
Hgb urine dipstick: NEGATIVE
Ketones, ur: 80 mg/dL — AB
Leukocytes,Ua: NEGATIVE
Nitrite: NEGATIVE
Protein, ur: 30 mg/dL — AB
Specific Gravity, Urine: 1.018 (ref 1.005–1.030)
pH: 5 (ref 5.0–8.0)

## 2022-08-02 LAB — COMPREHENSIVE METABOLIC PANEL
ALT: 33 U/L (ref 0–44)
AST: 51 U/L — ABNORMAL HIGH (ref 15–41)
Albumin: 3.7 g/dL (ref 3.5–5.0)
Alkaline Phosphatase: 62 U/L (ref 38–126)
Anion gap: 15 (ref 5–15)
BUN: 15 mg/dL (ref 8–23)
CO2: 24 mmol/L (ref 22–32)
Calcium: 9.5 mg/dL (ref 8.9–10.3)
Chloride: 102 mmol/L (ref 98–111)
Creatinine, Ser: 1.18 mg/dL — ABNORMAL HIGH (ref 0.44–1.00)
GFR, Estimated: 48 mL/min — ABNORMAL LOW (ref >60)
Glucose, Bld: 179 mg/dL — ABNORMAL HIGH (ref 70–99)
Potassium: 4.4 mmol/L (ref 3.5–5.1)
Sodium: 141 mmol/L (ref 135–145)
Total Bilirubin: 1 mg/dL (ref 0.3–1.2)
Total Protein: 6.7 g/dL (ref 6.5–8.1)

## 2022-08-02 LAB — LIPASE, BLOOD: Lipase: 23 U/L (ref 11–51)

## 2022-08-02 MED ORDER — SODIUM CHLORIDE 0.9 % IV BOLUS
500.0000 mL | Freq: Once | INTRAVENOUS | Status: AC
  Administered 2022-08-02: 500 mL via INTRAVENOUS

## 2022-08-02 MED ORDER — OXYCODONE-ACETAMINOPHEN 5-325 MG PO TABS: 1.0000 | ORAL_TABLET | Freq: Once | ORAL | Status: DC

## 2022-08-02 MED ORDER — LORAZEPAM 2 MG/ML IJ SOLN
1.0000 mg | Freq: Once | INTRAMUSCULAR | Status: AC
  Administered 2022-08-02: 1 mg via INTRAVENOUS
  Filled 2022-08-02: qty 1

## 2022-08-02 MED ORDER — SODIUM CHLORIDE 0.9 % IV BOLUS
1000.0000 mL | Freq: Once | INTRAVENOUS | Status: AC
  Administered 2022-08-02: 1000 mL via INTRAVENOUS

## 2022-08-02 NOTE — ED Provider Notes (Signed)
[facility] Provider Note   CSN: [id number] Arrival date & time: 08/02/22  1553     History  Chief Complaint  Patient presents with   Abdominal Pain    Toni Hill is a 77 y.o. female.   Abdominal Pain Patient presents abdominal pain and nausea.  Had been admitted to the hospital and discharged on January 25.  Seen again on the 27th for abdominal pain and coccyx pain.  Comes back today with continued pain.  States she has not eaten in a couple days due to the pain.  Decreased oral intake.  Not having diarrhea but reported nausea.  Similar pain she has had previously.    Past Medical History:  Diagnosis Date   At risk for adverse drug event 01/22/2019     RIOSORD (Risk Index for Overdose or Serious Opioid -induced Respiratory Depression Risk ) is a tool which determines that risk over the  ensuing 6 months . Based on point total for personal medical co-morbidities and present drug therapies ; the risk : benefit ratio of any drug regimen can be defined and informed consent documented. Reference: [facility] October 2017    Bilateral lower extremity edema    right > left   Bipolar 1 disorder ([place])    CKD (chronic kidney disease) stage 3, GFR 30-59 ml/min (HCC)     Dr Lawson Radar, [facility]   Constipation 06/06/2020   Diabetes, polyneuropathy ([place])    FEET AND FINGER TIPS   Fibromyalgia    GERD (gastroesophageal reflux disease)    History of chronic gastritis    History of COVID-24 Aug 2019   Hyperlipidemia    Hypertension    per pt was take off bp medication because of kidney disease told by her nephrologist   Hypothyroidism    Intractable nausea and vomiting 01/02/2020   Mixed incontinence urge and stress    OA (osteoarthritis)    KNEES   OAB (overactive bladder)    PONV (postoperative nausea and vomiting)    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   Type 2 diabetes mellitus treated  with insulin ([place])    Wears glasses     Home Medications Prior to Admission medications   Medication Sig Start Date End Date Taking? Authorizing Provider  Accu-Chek Softclix Lancets lancets Use to check blood sugar up to 4 times per day. 03/05/22   Farrel Gordon, DO  acetaminophen (TYLENOL) 500 MG tablet Take 1,000 mg by mouth in the morning and at bedtime.    [provider]  Blood Glucose Monitoring Suppl (ACCU-CHEK GUIDE) w/Device KIT Check blood sugar 1 times per day. 02/10/22   Farrel Gordon, DO  Calcium Carb-Cholecalciferol (CALCIUM 600/VITAMIN D3) 600-800 MG-UNIT TABS Take 1 tablet by mouth daily.     [provider]  diclofenac Sodium (VOLTAREN) 1 % GEL APPLY 1 APPLICATION TOPICALLY 4 TIMES A DAY Patient taking differently: Apply 2 g topically 4 (four) times daily. 05/10/22   Farrel Gordon, DO  docusate sodium (COLACE) 100 MG capsule Take 100 mg by mouth daily. Patient not taking: Reported on 07/31/2022    [provider]  ezetimibe (ZETIA) 10 MG tablet TAKE 1 TABLET BY MOUTH EVERY DAY IN THE EVENING 07/14/22   Farrel Gordon, DO  glucose blood (ACCU-CHEK GUIDE) test strip Check blood sugar up to 4 times per day 03/05/22   Farrel Gordon, DO  insulin  aspart (NOVOLOG) 100 UNIT/ML injection Inject 6 Units into the skin daily with breakfast AND 10 Units daily with lunch AND 10 Units daily with supper. 02/10/22   Farrel Gordon, DO  insulin degludec (TRESIBA FLEXTOUCH) 100 UNIT/ML FlexTouch Pen Inject 12 Units into the skin daily. 02/10/22   Farrel Gordon, DO  Insulin Pen Needle (B-D ULTRAFINE III SHORT PEN) 31G X 8 MM MISC 1 each by Does not apply route daily. Diagnosis code: E11.9. Used to inject insulin daily. 12/03/21   Maudie Mercury, MD  ketoconazole (NIZORAL) 2 % cream Apply 1 Application topically daily. Patient not taking: Reported on 07/31/2022 02/10/22   Farrel Gordon, DO  levothyroxine (SYNTHROID) 100 MCG tablet Take 1 tablet (100 mcg total) by mouth daily before breakfast. 07/29/22  10/27/22  Dessa Phi, DO  OneTouch Delica Lancets 22W MISC Check blood sugar 3 times a day 03/14/20   Maudie Mercury, MD  pantoprazole (PROTONIX) 40 MG tablet TAKE 1 TABLET BY MOUTH EVERY DAY Patient taking differently: Take 40 mg by mouth daily. 07/14/22   Farrel Gordon, DO  pregabalin (LYRICA) 150 MG capsule TAKE 1 CAPSULE BY MOUTH TWO TIMES A DAY 03/10/22   Farrel Gordon, DO  sertraline (ZOLOFT) 100 MG tablet TAKE 2 TABLETS BY MOUTH EVERY DAY 05/04/22   Farrel Gordon, DO  simvastatin (ZOCOR) 40 MG tablet TAKE 1 TABLET BY MOUTH EVERY DAY 07/26/22   Farrel Gordon, DO  tiZANidine (ZANAFLEX) 4 MG tablet Take 1 tablet (4 mg total) by mouth at bedtime. Patient taking differently: Take 4 mg by mouth See admin instructions. Take 4 mg in the morning and 4 mg at bedtime 07/29/22 10/27/22  Dessa Phi, DO  VITAMIN E PO Take 400 Units by mouth 2 (two) times daily.    [provider]      Allergies    Bactrim [sulfamethoxazole-trimethoprim], Cephalexin, Gabapentin, Morphine and related, Vioxx [rofecoxib], and Penicillins    Review of Systems   Review of Systems  Gastrointestinal:  Positive for abdominal pain.    Physical Exam Updated Vital Signs BP (!) 116/58   Pulse (!) 105   Temp 97.8 F (36.6 C) (Oral)   Resp 17   Ht '5\' 9"'$  (1.753 m)   Wt 72.6 kg   SpO2 98%   BMI 23.63 kg/m  Physical Exam Vitals and nursing note reviewed.  Cardiovascular:     Rate and Rhythm: Normal rate.  Pulmonary:     Breath sounds: Normal breath sounds.  Abdominal:     Tenderness: There is abdominal tenderness.     Comments: Somewhat diffuse abdominal tenderness.  No rebound or guarding.  No hernias palpated.  Skin:    Capillary Refill: Capillary refill takes less than 2 seconds.  Neurological:     Mental Status: She is alert.     Comments: Bedbound and chronically weak on the right.     ED Results / Procedures / Treatments   Labs (all labs ordered are listed, but only abnormal results are  displayed) Labs Reviewed  COMPREHENSIVE METABOLIC PANEL - Abnormal; Notable for the following components:      Result Value   Glucose, Bld 179 (*)    Creatinine, Ser 1.18 (*)    AST 51 (*)    GFR, Estimated 48 (*)    All other components within normal limits  URINALYSIS, W/ REFLEX TO CULTURE (INFECTION SUSPECTED) - Abnormal; Notable for the following components:   Ketones, ur 80 (*)    Protein, ur 30 (*)  Bacteria, UA RARE (*)    All other components within normal limits  LIPASE, BLOOD  CBC WITH DIFFERENTIAL/PLATELET    EKG None  Radiology No results found.  Procedures Procedures    Medications Ordered in ED Medications  sodium chloride 0.9 % bolus 500 mL (0 mLs Intravenous Stopped 08/02/22 1952)  LORazepam (ATIVAN) injection 1 mg (1 mg Intravenous Given 08/02/22 1749)  sodium chloride 0.9 % bolus 1,000 mL (1,000 mLs Intravenous New Bag/Given 08/02/22 2155)    ED Course/ Medical Decision Making/ A&P                             Medical Decision Making Amount and/or Complexity of Data Reviewed Labs: ordered.  Risk Prescription drug management.   Pain on the abdomen.  Patient abdominal pain.  Somewhat acute on chronic.  No hernia palpated.  Reviewed recent ER note from 2 days ago and discharge note from 4 days ago.  Sounds continued pain.  Will get basic blood work.  Will get EKG to evaluate.  Lab work overall reassuring however the creatinine has mildly increased again.  Also urine after IV hydration still shows 80 ketones.  With some persistent tachycardia inability to tolerate orals at home I think she would benefit from admission to the hospital.  Reviewed CT scan from 2 days ago do not think it needs to be repeated again.  Will discuss with hospitalist for admission.  Urine does not show infection.        Final Clinical Impression(s) / ED Diagnoses Final diagnoses:  Dehydration    Rx / DC Orders ED Discharge Orders     None         Davonna Belling, MD 08/02/22 2256

## 2022-08-02 NOTE — Telephone Encounter (Signed)
Transition Care Management Follow-up Telephone Call Date of discharge and from where: [facility] 07/30/2022 How have you been since you were released from the hospital? Still having difficulty breathing Any questions or concerns? No  Items Reviewed: Did the pt receive and understand the discharge instructions provided? Yes  Medications obtained and verified? Yes  Other? No  Any new allergies since your discharge? No  Dietary orders reviewed? Yes Do you have support at home? Yes   Home Care and Equipment/Supplies: Were home health services ordered? no If so, what is the name of the agency? N/a  Has the agency set up a time to come to the patient's home? not applicable Were any new equipment or medical supplies ordered?  No What is the name of the medical supply agency? N/a Were you able to get the supplies/equipment? no Do you have any questions related to the use of the equipment or supplies? No  Functional Questionnaire: (I = Independent and D = Dependent) ADLs: I  Bathing/Dressing- I  Meal Prep- I  Eating- I  Maintaining continence- I  Transferring/Ambulation- I  Managing Meds- I  Follow up appointments reviewed:  PCP Hospital f/u appt confirmed? Yes  Scheduled to see Serita Butcher on 08/03/2022 @ 10:15. [facility] f/u appt confirmed? No   Are transportation arrangements needed? No  If their condition worsens, is the pt aware to call PCP or go to the Emergency Dept.? Yes Was the patient provided with contact information for the PCP's office or ED? Yes Was to pt encouraged to call back with questions or concerns? Yes Juanda Crumble, LPN [facility] Direct Dial [phone]

## 2022-08-02 NOTE — ED Triage Notes (Signed)
Per EMS patient was seen on 07/27/22 - 07/29/22 for diarrhea and abdominal pain. Patient seen again in ED on 1/27 for just the abdominal pain without diarrhea. Patient is still having abdominal pain today and wanted to be seen. Patient states "my belly hurts around the belly button." CBG 212

## 2022-08-02 NOTE — Progress Notes (Incomplete)
CC: ***  HPI:   [name] is a 77 y.o. female with a past medical history of hypertension, hyperlipidemia, diabetes complicated by neuropathy, hypothyroidism, BP I, and CKD III who presents for hospital follow-up. She was admitted to [facility] on 1-22 for nausea-diarrhea secondary to gastroenteritis, found to have low TSH, and discharged 1-25 with lower dose of levothyroxine. Last seen at [facility] in 02-2022.   Recommendations for Outpatient Follow-up:  Follow up with PCP in 1 week Decreased home Synthroid dose.  Repeat TSH and free T4 in 4 to 6 weeks   Discharge Condition: Stable, improved CODE STATUS: Full code Diet recommendation: Carb modified diet   Brief/Interim Summary: Toni Hill is a 77 year old female with diabetes mellitus type 2 (noncompliant with diet), CVA, GERD, h/o cholecystectomy with chronic CBD dilatation, hypertension, hypothyroidism, hyperlipidemia presented to the hospital with nausea, abd pain, and diarrhea. She has not been eating for few days, checking her sugars or taking her insulin.  She was confused in the ED and it was difficult to obtain a history from her.  She was tachycardic - rate as high as 133, RR as high as 30. UA showed large bacteria > 50 WBC/hpf, small amount of RBC but also squamous cell. CT > b/l non obstruction renal calculi and b/l enlarging cysts.  Patient was started on IV fluids and ceftriaxone.  Patient's urine culture came back contaminant.  She had slow improvement and diet was advanced.  No further nausea, vomiting or diarrhea.  Her TSH was found to be low, free T4 elevated, her home Synthroid dose was adjusted accordingly.    Discharge Diagnoses:  Principal Problem:   Diarrhea in adult patient Active Problems:   Type 2 diabetes mellitus (HCC)   Hyperlipidemia   Bipolar 1 disorder (HCC)   UTI (urinary tract infection)   Right leg weakness   GERD (gastroesophageal reflux disease)   Common bile duct dilatation   Dehydration      Decreased oral intake, diarrhea secondary to suspected gastroenteritis  -Right upper quadrant abdominal ultrasound without obstructing lesion. LFTs within normal limits  -Diarrhea has now resolved -Oral intake improved, tolerating regular diet this morning  X Nausea, diarrhea, vomiting, x    Sepsis secondary to urinary tract infection vs gastroenteritis, present on admission -Urine culture showed contaminant -Continue empiric Rocephin --> fosfomycin  X Finished antibiotic course . . .    Diabetes mellitus Last HbA1c in January 2023 was 7.8%.  Current management is Tresiba (degludec) 15 units daily and NovoLog (aspart) sliding scale insulin with meals.  HbA1c today is 8.2%. Toni Hill wore the CGM for 14 days. The average reading was 186, % time in target was 44, % time below target was 4, and % time above target was 52%.  When she experiences low blood sugar it tends to be around 9 AM and again around 5 to 6 PM.  She states that she does not take her insulin until her food is in front of her at mealtime.  She was previously managed outpatient by an endocrinologist who she has not seen in some time. Plan: Will continue Tresiba 12 units daily.  Patient advised to adjust NovoLog dose to 6 units in the morning but continue 10 units with lunch and dinner.  Patient has established CKD so there is no need in obtaining urine to assess for proteinuria.  She will need follow-up for diabetes in 3 months. -Sliding scale insulin while in [facility] recently  Xxx Last  A1C 8.2 in 02-2022 Recheck today = Taking insulin at home - aspart 12-12-08 and degludec 12    Hypothyroidism Patient currently taking Synthroid 137 mcg daily.  She denies any symptoms related to her thyroid. Plan: Continue Synthroid 137 mcg daily.  She will be due for recheck thyroid labs in or around January. -Continue synthroid -Decreased TSH. Free T4 elevated -Synthroid dose decreased   Xx Current dose 100ug q24 Palpitations,  diaphoresis, tremors, heat-cold intolerance   HLD + HTN BP well controlled today at 115/66, not currently on therapy. Plan:No medications started at today's visit. Current regimen is Setia 10 mg daily and simvastatin 40 mg daily.  Lipid panel last checked in January 2023 and LDL was above goal at 123. Plan: Continue current regimen.  Will need recheck of lipid panel in or around January. -Zocor (simvastatin)  Xxx Taking simvastatin Check lipid panel    ___   Demand ischemia -Troponin 38, 41 -Secondary to sepsis    CKD stage IIIa -Stable    Chronic right leg weakness and immobility -Resides at home with family -Zanaflex, lyrica     Mood disorder  -Zoloft (sertraline)    Chronic left knee pain -Xray: Left total knee arthroplasty without radiographic evidence for complication. No acute abnormality.     Esophageal reflux Patient reports that if she does not take Protonix she feels symptoms of GERD and does still need this medication. Plan:Continue protonix.   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 Plan: Bedside commode order placed.    Fungal infection of skin Patient complains today of a extremely itchy rash that covers her entire posterior thorax that she says has been present for about 1 year.  On further questioning she states that she has not had a proper bath in at least 1 year including cleaning of her skin with soap and warm water.  Often when she does get cleaned it is a quick wipe down with water and the skin is not dried fully before her clothes are put back on. Exam is significant for scabbing and excoriations over the entire posterior thorax with erythematous and irritated appearing skin.  Rash expands laterally but is present only on the back.  She denies knowledge of any insect bites.  She spends all of her time in her bed but to her knowledge she just not have any trouble with  bedbugs. Assessment: Skin rash concerning for some sort of fungal source.  She says that she does have someone who is able to help apply prescription cream over the rash. Plan: I have sent in ketoconazole cream to be applied once daily to the entire back.  I have advised that the patient should receive baths with warm soapy water and that her skin needs to be fully dried before applying the cream or close.  We will follow-up with a telehealth visit to follow-up on how she is doing in 1 week as she is severely limited on her ability to get to the office.  Resolved ????   Past Medical History:  Diagnosis Date   At risk for adverse drug event 01/22/2019     RIOSORD (Risk Index for Overdose or Serious Opioid -induced Respiratory Depression Risk ) is a tool which determines that risk over the  ensuing 6 months . Based on point total for personal medical co-morbidities and present drug therapies ; the risk : benefit ratio of any drug regimen can be defined and informed consent  documented. Reference: [facility] Hospitalist October 2017    Bilateral lower extremity edema    right > left   Bipolar 1 disorder ([place])    CKD (chronic kidney disease) stage 3, GFR 30-59 ml/min (HCC)     Dr Lawson Radar, [facility]   Constipation 06/06/2020   Diabetes, polyneuropathy ([place])    FEET AND FINGER TIPS   Fibromyalgia    GERD (gastroesophageal reflux disease)    History of chronic gastritis    History of COVID-24 Aug 2019   Hyperlipidemia    Hypertension    per pt was take off bp medication because of kidney disease told by her nephrologist   Hypothyroidism    Intractable nausea and vomiting 01/02/2020   Mixed incontinence urge and stress    OA (osteoarthritis)    KNEES   OAB (overactive bladder)    PONV (postoperative nausea and vomiting)    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  General:   awake and alert, sitting comfortably in chair, cooperative, not in acute distress Skin:   warm and dry, intact without any obvious lesions or scars, no rashes Head:   normocephalic and atraumatic, oral mucosa moist with good dentition, no lymphadenopathy Eyes:   extraocular movements intact, conjunctivae pink, pupils round and reactive to light, no periorbital swelling or scleral icterus Ears:   pinnae normal, no discharge or external lesions  Nose:   symmetrical and without mucosal inflammation, no external lesions or discharge Lungs:   normal respiratory effort, breathing unlabored, symmetrical chest rise, no crackles or wheezing Cardiac:   regular rate and rhythm, normal S1 and S2, capillary refill 2-3 seconds, dorsalis pedis pulses intact bilaterally, no pitting edema Abdomen:   soft and non-distended, normoactive bowel sounds present in all four quadrants, no guarding or palpable masses Musculoskeletal:   full range of motion in joints, motor strength 5 /5 in all four extremities, no obvious deformities or joint tenderness Neurologic:   oriented to person-place-time, moving all extremities, sensation to light touch intact, no gross focal deficits Psychiatric:   euthymic mood with congruent affect, intelligible speech    Assessment & Plan:   No problem-specific Assessment & Plan notes found for this encounter.     See Encounters Tab for problem based charting.  Patient {GC/GE:[id number]::"discussed with","seen with"} Dr. {NAMES:[id number]::"Guilloud","Hoffman","Mullen","Narendra","Williams","Vincent"}

## 2022-08-03 ENCOUNTER — Encounter: Payer: No Typology Code available for payment source | Admitting: Student

## 2022-08-03 ENCOUNTER — Inpatient Hospital Stay (HOSPITAL_COMMUNITY): Payer: No Typology Code available for payment source

## 2022-08-03 ENCOUNTER — Encounter (HOSPITAL_COMMUNITY): Payer: Self-pay | Admitting: Internal Medicine

## 2022-08-03 DIAGNOSIS — E1165 Type 2 diabetes mellitus with hyperglycemia: Secondary | ICD-10-CM

## 2022-08-03 DIAGNOSIS — R109 Unspecified abdominal pain: Secondary | ICD-10-CM | POA: Insufficient documentation

## 2022-08-03 DIAGNOSIS — R110 Nausea: Secondary | ICD-10-CM

## 2022-08-03 DIAGNOSIS — R1033 Periumbilical pain: Secondary | ICD-10-CM

## 2022-08-03 DIAGNOSIS — R1010 Upper abdominal pain, unspecified: Secondary | ICD-10-CM

## 2022-08-03 DIAGNOSIS — Z794 Long term (current) use of insulin: Secondary | ICD-10-CM

## 2022-08-03 DIAGNOSIS — F39 Unspecified mood [affective] disorder: Secondary | ICD-10-CM | POA: Insufficient documentation

## 2022-08-03 DIAGNOSIS — I10 Essential (primary) hypertension: Secondary | ICD-10-CM

## 2022-08-03 DIAGNOSIS — E039 Hypothyroidism, unspecified: Secondary | ICD-10-CM

## 2022-08-03 DIAGNOSIS — N1831 Chronic kidney disease, stage 3a: Secondary | ICD-10-CM

## 2022-08-03 DIAGNOSIS — E860 Dehydration: Secondary | ICD-10-CM | POA: Diagnosis not present

## 2022-08-03 DIAGNOSIS — E782 Mixed hyperlipidemia: Secondary | ICD-10-CM

## 2022-08-03 LAB — GLUCOSE, CAPILLARY
Glucose-Capillary: 141 mg/dL — ABNORMAL HIGH (ref 70–99)
Glucose-Capillary: 339 mg/dL — ABNORMAL HIGH (ref 70–99)
Glucose-Capillary: 244 mg/dL — ABNORMAL HIGH (ref 70–99)

## 2022-08-03 LAB — LACTIC ACID, PLASMA
Lactic Acid, Venous: 0.7 mmol/L (ref 0.5–1.9)
Lactic Acid, Venous: 1.9 mmol/L (ref 0.5–1.9)

## 2022-08-03 LAB — CBC
HCT: 39.6 % (ref 36.0–46.0)
Hemoglobin: 12.5 g/dL (ref 12.0–15.0)
MCH: 31.3 pg (ref 26.0–34.0)
MCHC: 31.6 g/dL (ref 30.0–36.0)
MCV: 99 fL (ref 80.0–100.0)
Platelets: 235 10*3/uL (ref 150–400)
RBC: 4 MIL/uL (ref 3.87–5.11)
RDW: 13.1 % (ref 11.5–15.5)
WBC: 6.8 10*3/uL (ref 4.0–10.5)
nRBC: 0 % (ref 0.0–0.2)

## 2022-08-03 LAB — CREATININE, SERUM
Creatinine, Ser: 1.13 mg/dL — ABNORMAL HIGH (ref 0.44–1.00)
GFR, Estimated: 51 mL/min — ABNORMAL LOW (ref >60)

## 2022-08-03 MED ORDER — LEVOTHYROXINE SODIUM 50 MCG PO TABS
100.0000 ug | ORAL_TABLET | Freq: Every day | ORAL | Status: DC
  Administered 2022-08-04 – 2022-08-06 (×3): 100 ug via ORAL
  Filled 2022-08-03 (×3): qty 2

## 2022-08-03 MED ORDER — SERTRALINE HCL 50 MG PO TABS
200.0000 mg | ORAL_TABLET | Freq: Every day | ORAL | Status: DC
  Administered 2022-08-03 – 2022-08-06 (×4): 200 mg via ORAL
  Filled 2022-08-03 (×4): qty 4

## 2022-08-03 MED ORDER — PROCHLORPERAZINE EDISYLATE 10 MG/2ML IJ SOLN
10.0000 mg | INTRAMUSCULAR | Status: DC | PRN
  Administered 2022-08-05: 10 mg via INTRAVENOUS
  Filled 2022-08-03: qty 2

## 2022-08-03 MED ORDER — SIMVASTATIN 10 MG PO TABS
40.0000 mg | ORAL_TABLET | Freq: Every day | ORAL | Status: DC
  Administered 2022-08-03 – 2022-08-06 (×4): 40 mg via ORAL
  Filled 2022-08-03 (×4): qty 4

## 2022-08-03 MED ORDER — ENOXAPARIN SODIUM 40 MG/0.4ML IJ SOSY
40.0000 mg | PREFILLED_SYRINGE | INTRAMUSCULAR | Status: DC
  Administered 2022-08-03 – 2022-08-06 (×4): 40 mg via SUBCUTANEOUS
  Filled 2022-08-03 (×4): qty 0.4

## 2022-08-03 MED ORDER — TIZANIDINE HCL 4 MG PO TABS
4.0000 mg | ORAL_TABLET | Freq: Every day | ORAL | Status: DC
  Administered 2022-08-03 – 2022-08-05 (×3): 4 mg via ORAL
  Filled 2022-08-03 (×3): qty 1

## 2022-08-03 MED ORDER — IOHEXOL 350 MG/ML SOLN
75.0000 mL | Freq: Once | INTRAVENOUS | Status: AC | PRN
  Administered 2022-08-03: 75 mL via INTRAVENOUS

## 2022-08-03 MED ORDER — PANTOPRAZOLE SODIUM 40 MG PO TBEC
40.0000 mg | DELAYED_RELEASE_TABLET | Freq: Every day | ORAL | Status: DC
  Administered 2022-08-03 – 2022-08-06 (×4): 40 mg via ORAL
  Filled 2022-08-03 (×4): qty 1

## 2022-08-03 MED ORDER — EZETIMIBE 10 MG PO TABS
10.0000 mg | ORAL_TABLET | Freq: Every evening | ORAL | Status: DC
  Administered 2022-08-03 – 2022-08-06 (×4): 10 mg via ORAL
  Filled 2022-08-03 (×4): qty 1

## 2022-08-03 MED ORDER — INSULIN GLARGINE-YFGN 100 UNIT/ML ~~LOC~~ SOLN
6.0000 [IU] | Freq: Every day | SUBCUTANEOUS | Status: DC
  Administered 2022-08-03 – 2022-08-05 (×3): 6 [IU] via SUBCUTANEOUS
  Filled 2022-08-03 (×5): qty 0.06

## 2022-08-03 MED ORDER — INFLUENZA VAC A&B SA ADJ QUAD 0.5 ML IM PRSY
0.5000 mL | PREFILLED_SYRINGE | INTRAMUSCULAR | Status: AC
  Administered 2022-08-05: 0.5 mL via INTRAMUSCULAR
  Filled 2022-08-03: qty 0.5

## 2022-08-03 MED ORDER — PANTOPRAZOLE SODIUM 40 MG IV SOLR: 40.0000 mg | INTRAVENOUS | Status: DC

## 2022-08-03 MED ORDER — SODIUM CHLORIDE 0.9 % IV SOLN: INTRAVENOUS | Status: AC

## 2022-08-03 MED ORDER — INSULIN ASPART 100 UNIT/ML IJ SOLN
0.0000 [IU] | Freq: Three times a day (TID) | INTRAMUSCULAR | Status: DC
  Administered 2022-08-03: 7 [IU] via SUBCUTANEOUS
  Administered 2022-08-03: 1 [IU] via SUBCUTANEOUS
  Administered 2022-08-04: 3 [IU] via SUBCUTANEOUS
  Administered 2022-08-04 – 2022-08-05 (×3): 2 [IU] via SUBCUTANEOUS
  Administered 2022-08-05: 3 [IU] via SUBCUTANEOUS
  Administered 2022-08-05 – 2022-08-06 (×3): 2 [IU] via SUBCUTANEOUS
  Administered 2022-08-06: 3 [IU] via SUBCUTANEOUS

## 2022-08-03 MED ORDER — ACETAMINOPHEN 325 MG PO TABS: 650.0000 mg | ORAL_TABLET | Freq: Four times a day (QID) | ORAL | Status: DC | PRN

## 2022-08-03 MED ORDER — FENTANYL CITRATE PF 50 MCG/ML IJ SOSY
12.5000 ug | PREFILLED_SYRINGE | INTRAMUSCULAR | Status: DC | PRN
  Administered 2022-08-03 (×2): 12.5 ug via INTRAVENOUS
  Filled 2022-08-03 (×2): qty 1

## 2022-08-03 MED ORDER — PREGABALIN 75 MG PO CAPS
150.0000 mg | ORAL_CAPSULE | Freq: Two times a day (BID) | ORAL | Status: DC
  Administered 2022-08-03 – 2022-08-06 (×7): 150 mg via ORAL
  Filled 2022-08-03 (×7): qty 2

## 2022-08-03 MED ORDER — ACETAMINOPHEN 650 MG RE SUPP: 650.0000 mg | Freq: Four times a day (QID) | RECTAL | Status: DC | PRN

## 2022-08-03 NOTE — Progress Notes (Signed)
Patient stating her right foot was hit on the bed when transferring for CT scan, concerned something may be broken. MD Maylene Roes notified.

## 2022-08-03 NOTE — Progress Notes (Signed)
  Transition of Care [facility]) Screening Note   Patient Details  Name: Toni Hill Date of Birth: [birth year]   [facility]) CM/SW Contact:    Iona Beard, [place] Phone Number: 08/03/2022, 10:06 AM    Transition of Care Department [facility]) has reviewed patient and no TOC needs have been identified at this time. We will continue to monitor patient advancement through interdisciplinary progression rounds. If new patient transition needs arise, please place a TOC consult.

## 2022-08-03 NOTE — ED Notes (Signed)
Call to 3A CN, she advised that RN will call for report once bed assigned.

## 2022-08-03 NOTE — ED Notes (Signed)
ED TO INPATIENT HANDOFF REPORT  ED Nurse Name and Phone #: Dorian Pod Isauro Skelley  S Name/Age/Gender Toni Hill 77 y.o. female Room/Bed: APA11/APA11  Code Status   Code Status: Prior  Home/SNF/Other Home Patient oriented to: self, time, and situation Is this baseline?  Pt is able to tell me her name, dob, and situation.  She can not tell me date and states "2023" when I asked for the year  Triage Complete: Triage complete  Chief Complaint Intractable nausea and vomiting [R11.2]  Triage Note Per EMS patient was seen on 07/27/22 - 07/29/22 for diarrhea and abdominal pain. Patient seen again in ED on 1/27 for just the abdominal pain without diarrhea. Patient is still having abdominal pain today and wanted to be seen. Patient states "my belly hurts around the belly button." CBG 212   Allergies Allergies  Allergen Reactions   Bactrim [Sulfamethoxazole-Trimethoprim]     Patient experienced nephrotoxicity in the setting of CKD. Strongly recommend against using this antibiotic in the future.    Cephalexin Itching   Gabapentin     Kidney issue   Morphine And Related Nausea And Vomiting   Vioxx [Rofecoxib]     GI bleeding   Penicillins Hives and Itching    Has patient had a PCN reaction causing immediate rash, facial/tongue/throat swelling, SOB or lightheadedness with hypotension: No Has patient had a PCN reaction causing severe rash involving mucus membranes or skin necrosis: No Has patient had a PCN reaction that required hospitalization: no Has patient had a PCN reaction occurring within the last 10 years: No If all of the above answers are "NO", then may proceed with Cephalosporin use.     Level of Care/Admitting Diagnosis ED Disposition     ED Disposition  Admit   Condition  --   [place]: [facility] [[id number]]  Level of Care: Med-Surg [16]  Covid Evaluation: Asymptomatic - no recent exposure (last 10 days) testing not required  Diagnosis:  Intractable nausea and vomiting [923300]  Admitting Physician: Bernadette Hoit [[id number]]  Attending Physician: Bernadette Hoit [[id number]]  Certification:: I certify this patient will need inpatient services for at least 2 midnights  Estimated Length of Stay: 3          B Medical/Surgery History Past Medical History:  Diagnosis Date   At risk for adverse drug event 01/22/2019     RIOSORD (Risk Index for Overdose or Serious Opioid -induced Respiratory Depression Risk ) is a tool which determines that risk over the  ensuing 6 months . Based on point total for personal medical co-morbidities and present drug therapies ; the risk : benefit ratio of any drug regimen can be defined and informed consent documented. Reference: [facility] Hospitalist October 2017    Bilateral lower extremity edema    right > left   Bipolar 1 disorder (HCC)    CKD (chronic kidney disease) stage 3, GFR 30-59 ml/min (HCC)     Dr Lawson Radar, [facility]   Constipation 06/06/2020   Diabetes, polyneuropathy ([place])    FEET AND FINGER TIPS   Fibromyalgia    GERD (gastroesophageal reflux disease)    History of chronic gastritis    History of COVID-24 Aug 2019   Hyperlipidemia    Hypertension    per pt was take off bp medication because of kidney disease told by her nephrologist   Hypothyroidism    Intractable nausea and vomiting 01/02/2020   Mixed incontinence  urge and stress    OA (osteoarthritis)    KNEES   OAB (overactive bladder)    PONV (postoperative nausea and vomiting)    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   Type 2 diabetes mellitus treated with insulin ([place])    Wears glasses    Past Surgical History:  Procedure Laterality Date   CHOLECYSTECTOMY OPEN  1990's   COLONOSCOPY  last one 07-17-2014   CYSTOSCOPY WITH INJECTION N/A 10/29/2016   Procedure: CYSTOSCOPY WITH INJECTION BOTOX 100 UNITS, urethral dilation;  Surgeon: Carolan Clines, MD;  Location: [place];  Service: Urology;  Laterality: N/A;   ESOPHAGOGASTRODUODENOSCOPY  last one 07-01-2016   EXCISIONAL BREAST BX  1990   BENIGN   HARDWARE REMOVAL Right 03/05/2020   Procedure: HARDWARE REMOVAL RIGHT ANKLE;  Surgeon: Leandrew Koyanagi, MD;  Location: [place];  Service: Orthopedics;  Laterality: Right;   ORIF TIBIA & FIBULA FRACTURES  2010   retained rod   TONSILLECTOMY  age 75   TOTAL KNEE ARTHROPLASTY Left 01/15/2019   Procedure: LEFT TOTAL KNEE ARTHROPLASTY;  Surgeon: Leandrew Koyanagi, MD;  Location: [place];  Service: Orthopedics;  Laterality: Left;   VAGINAL HYSTERECTOMY  1979   VENTRAL HERNIA REPAIR  1990's     A IV Location/Drains/Wounds Patient Lines/Drains/Airways Status     Active Line/Drains/Airways     Name Placement date Placement time Site Days   Peripheral IV 08/02/22 20 G Left Antecubital 08/02/22  1749  Antecubital  1   External Urinary Catheter 08/03/22  0000  --  less than 1   Wound / Incision (Open or Dehisced) 06/11/20 Other (Comment) Buttocks Right 06/11/20  2230  Buttocks  783            Intake/Output Last 24 hours No intake or output data in the 24 hours ending 08/03/22 0746  Labs/Imaging Results for orders placed or performed during the hospital encounter of 08/02/22 (from the past 48 hour(s))  Comprehensive metabolic panel     Status: Abnormal   Collection Time: 08/02/22  4:36 PM  Result Value Ref Range   Sodium 141 135 - 145 mmol/L   Potassium 4.4 3.5 - 5.1 mmol/L   Chloride 102 98 - 111 mmol/L   CO2 24 22 - 32 mmol/L   Glucose, Bld 179 (H) 70 - 99 mg/dL    Comment: Glucose reference range applies only to samples taken after fasting for at least 8 hours.   BUN 15 8 - 23 mg/dL   Creatinine, Ser 1.18 (H) 0.44 - 1.00 mg/dL   Calcium 9.5 8.9 - 10.3 mg/dL   Total Protein 6.7 6.5 - 8.1 g/dL   Albumin 3.7 3.5 - 5.0 g/dL   AST 51 (H) 15 - 41 U/L   ALT 33 0 - 44 U/L   Alkaline Phosphatase 62 38 - 126 U/L   Total Bilirubin 1.0  0.3 - 1.2 mg/dL   GFR, Estimated 48 (L) >60 mL/min    Comment: (NOTE) Calculated using the CKD-EPI Creatinine Equation (2021)    Anion gap 15 5 - 15    Comment: Performed at [facility], [address] [place], [place] [address]  Lipase, blood     Status: None   Collection Time: 08/02/22  4:36 PM  Result Value Ref Range   Lipase 23 11 - 51 U/L    Comment: Performed at [facility], [address], [place], [place] [address]  CBC with Differential  Status: None   Collection Time: 08/02/22  4:36 PM  Result Value Ref Range   WBC 7.0 4.0 - 10.5 K/uL   RBC 4.22 3.87 - 5.11 MIL/uL   Hemoglobin 13.2 12.0 - 15.0 g/dL   HCT 41.1 36.0 - 46.0 %   MCV 97.4 80.0 - 100.0 fL   MCH 31.3 26.0 - 34.0 pg   MCHC 32.1 30.0 - 36.0 g/dL   RDW 13.1 11.5 - 15.5 %   Platelets 267 150 - 400 K/uL   nRBC 0.0 0.0 - 0.2 %   Neutrophils Relative % 71 %   Neutro Abs 4.9 1.7 - 7.7 K/uL   Lymphocytes Relative 21 %   Lymphs Abs 1.5 0.7 - 4.0 K/uL   Monocytes Relative 8 %   Monocytes Absolute 0.6 0.1 - 1.0 K/uL   Eosinophils Relative 0 %   Eosinophils Absolute 0.0 0.0 - 0.5 K/uL   Basophils Relative 0 %   Basophils Absolute 0.0 0.0 - 0.1 K/uL   Immature Granulocytes 0 %   Abs Immature Granulocytes 0.02 0.00 - 0.07 K/uL    Comment: Performed at [facility], [address] [place], [place] [address]  Urinalysis, w/ Reflex to Culture (Infection Suspected) -Urine, Clean Catch     Status: Abnormal   Collection Time: 08/02/22  9:50 PM  Result Value Ref Range   Specimen Source URINE, CLEAN CATCH    Color, Urine YELLOW YELLOW   APPearance CLEAR CLEAR   Specific Gravity, Urine 1.018 1.005 - 1.030   pH 5.0 5.0 - 8.0   Glucose, UA NEGATIVE NEGATIVE mg/dL   Hgb urine dipstick NEGATIVE NEGATIVE   Bilirubin Urine NEGATIVE NEGATIVE   Ketones, ur 80 (A) NEGATIVE mg/dL   Protein, ur 30 (A) NEGATIVE mg/dL   Nitrite NEGATIVE NEGATIVE   Leukocytes,Ua NEGATIVE NEGATIVE   RBC / HPF 0-5 0 - 5 RBC/hpf   WBC, UA 0-5 0 -  5 WBC/hpf    Comment:        Reflex urine culture not performed if WBC <=10, OR if Squamous epithelial cells >5. If Squamous epithelial cells >5 suggest recollection.    Bacteria, UA RARE (A) NONE SEEN   Squamous Epithelial / HPF 0-5 0 - 5 /HPF   Hyaline Casts, UA PRESENT     Comment: Performed at [facility], [address], [place], [place] [address]   No results found.  Pending Labs Unresulted Labs (From admission, onward)    None       Vitals/Pain Today's Vitals   08/03/22 0600 08/03/22 0700 08/03/22 0730 08/03/22 0745  BP: '96/70 99/72 99/75 '$ 117/81  Pulse: 100   (!) 110  Resp: 15 15 (!) 25 20  Temp:    97.8 F (36.6 C)  TempSrc:      SpO2: 97%   99%  Weight:      Height:      PainSc:    8     Isolation Precautions No active isolations  Medications Medications  sodium chloride 0.9 % bolus 500 mL (0 mLs Intravenous Stopped 08/02/22 1952)  LORazepam (ATIVAN) injection 1 mg (1 mg Intravenous Given 08/02/22 1749)  sodium chloride 0.9 % bolus 1,000 mL (0 mLs Intravenous Stopped 08/03/22 0032)    Mobility non-ambulatory     Focused Assessments Abdominal pain x10 days   R Recommendations: See Admitting Provider Note  Report given to:   Additional Notes: Sister is at the bedside, pt has been non ambulatory for 2 years and states  that she is paralyzed but does not know what caused it.

## 2022-08-03 NOTE — H&P (Signed)
History and Physical    Patient: Toni Hill HWE:[id number] DOB: 03-20-1946 DOA: 08/02/2022 DOS: the patient was seen and examined on 08/03/2022 PCP: Farrel Gordon, DO  Patient coming from: Home  Chief Complaint:  Chief Complaint  Patient presents with   Abdominal Pain   HPI: Toni Hill is a 77 y.o. female with medical history significant of hypertension, hyperlipidemia, hypothyroidism, T2DM, GERD, bipolar 1 disorder who presents to the emergency department accompanied by sister due to nausea and abdominal pain.   Patient was admitted from 1/22 to 1/26 due to diarrhea associated to be secondary to suspected gastroenteritis and sepsis secondary to UTI versus gastroenteritis which was treated with Rocephin prior to being transitioned to fosfomycin. Patient presented  to the ED on January 27 with complaint of abdominal and coccyx pain and she returns today with same pain.  Abdominal pain was epigastric and sharp in nature, pain worsens on eating, so patient has not eating in a couple of days due to fear of abdominal pain.  Abdominal pain was associated with nausea without vomiting.   ED Course:  In the emergency department, BP was 155/70, but other vital signs were within normal range.  Workup in the ED showed normal CBC and BMP except for hyperglycemia and creatinine of 1.18 (creatinine is within baseline range).  Lipase 23, urinalysis was normal except for slight ketonuria and minimal proteinuria. She was treated with Ativan, fentanyl was given and patient was provided with IV hydration, Compazine was started due to nausea.  Hospitalist was asked to admit patient for further evaluation and management.  Review of Systems: Review of systems as noted in the HPI. All other systems reviewed and are negative.   Past Medical History:  Diagnosis Date   At risk for adverse drug event 01/22/2019     RIOSORD (Risk Index for Overdose or Serious Opioid -induced Respiratory Depression Risk ) is a  tool which determines that risk over the  ensuing 6 months . Based on point total for personal medical co-morbidities and present drug therapies ; the risk : benefit ratio of any drug regimen can be defined and informed consent documented. Reference: [facility] October 2017    Bilateral lower extremity edema    right > left   Bipolar 1 disorder ([place])    CKD (chronic kidney disease) stage 3, GFR 30-59 ml/min (HCC)     Dr Lawson Radar, [facility]   Constipation 06/06/2020   Diabetes, polyneuropathy ([place])    FEET AND FINGER TIPS   Fibromyalgia    GERD (gastroesophageal reflux disease)    History of chronic gastritis    History of COVID-24 Aug 2019   Hyperlipidemia    Hypertension    per pt was take off bp medication because of kidney disease told by her nephrologist   Hypothyroidism    Intractable nausea and vomiting 01/02/2020   Mixed incontinence urge and stress    OA (osteoarthritis)    KNEES   OAB (overactive bladder)    PONV (postoperative nausea and vomiting)    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   Type 2 diabetes mellitus treated with insulin ([place])    Wears glasses    Past Surgical History:  Procedure Laterality Date   CHOLECYSTECTOMY OPEN  1990's   COLONOSCOPY  last one 07-17-2014   CYSTOSCOPY WITH INJECTION N/A 10/29/2016   Procedure: CYSTOSCOPY WITH INJECTION BOTOX 100 UNITS, urethral dilation;  Surgeon: Pierre Bali  Gaynelle Arabian, MD;  Location[facility];  Service: Urology;  Laterality: N/A;   ESOPHAGOGASTRODUODENOSCOPY  last one 07-01-2016   EXCISIONAL BREAST BX  1990   BENIGN   HARDWARE REMOVAL Right 03/05/2020   Procedure: HARDWARE REMOVAL RIGHT ANKLE;  Surgeon: Leandrew Koyanagi, MD;  Location: [facility];  Service: Orthopedics;  Laterality: Right;   ORIF TIBIA & FIBULA FRACTURES  2010   retained rod   TONSILLECTOMY  age 73   TOTAL KNEE ARTHROPLASTY Left 01/15/2019   Procedure: LEFT TOTAL KNEE  ARTHROPLASTY;  Surgeon: Leandrew Koyanagi, MD;  Location: [facility];  Service: Orthopedics;  Laterality: Left;   VAGINAL HYSTERECTOMY  1979   VENTRAL HERNIA REPAIR  1990's    Social History:  reports that she has never smoked. She has never used smokeless tobacco. She reports that she does not drink alcohol and does not use drugs.   Allergies  Allergen Reactions   Bactrim [Sulfamethoxazole-Trimethoprim]     Patient experienced nephrotoxicity in the setting of CKD. Strongly recommend against using this antibiotic in the future.    Cephalexin Itching   Gabapentin     Kidney issue   Morphine And Related Nausea And Vomiting   Vioxx [Rofecoxib]     GI bleeding   Penicillins Hives and Itching    Has patient had a PCN reaction causing immediate rash, facial/tongue/throat swelling, SOB or lightheadedness with hypotension: No Has patient had a PCN reaction causing severe rash involving mucus membranes or skin necrosis: No Has patient had a PCN reaction that required hospitalization: no Has patient had a PCN reaction occurring within the last 10 years: No If all of the above answers are "NO", then may proceed with Cephalosporin use.     Family History  Problem Relation Age of Onset   Breast cancer Mother 74   Cancer Father        ? type   Breast cancer Sister 52   Breast cancer Maternal Grandmother        ? age     Prior to Admission medications   Medication Sig Start Date End Date Taking? Authorizing Provider  Accu-Chek Softclix Lancets lancets Use to check blood sugar up to 4 times per day. 03/05/22   Farrel Gordon, DO  acetaminophen (TYLENOL) 500 MG tablet Take 1,000 mg by mouth in the morning and at bedtime.    [provider]  Blood Glucose Monitoring Suppl (ACCU-CHEK GUIDE) w/Device KIT Check blood sugar 1 times per day. 02/10/22   Farrel Gordon, DO  Calcium Carb-Cholecalciferol (CALCIUM 600/VITAMIN D3) 600-800 MG-UNIT TABS Take 1 tablet by mouth daily.     [provider]   diclofenac Sodium (VOLTAREN) 1 % GEL APPLY 1 APPLICATION TOPICALLY 4 TIMES A DAY Patient taking differently: Apply 2 g topically 4 (four) times daily. 05/10/22   Farrel Gordon, DO  docusate sodium (COLACE) 100 MG capsule Take 100 mg by mouth daily. Patient not taking: Reported on 07/31/2022    [provider]  ezetimibe (ZETIA) 10 MG tablet TAKE 1 TABLET BY MOUTH EVERY DAY IN THE EVENING 07/14/22   Farrel Gordon, DO  glucose blood (ACCU-CHEK GUIDE) test strip Check blood sugar up to 4 times per day 03/05/22   Farrel Gordon, DO  insulin aspart (NOVOLOG) 100 UNIT/ML injection Inject 6 Units into the skin daily with breakfast AND 10 Units daily with lunch AND 10 Units daily with supper. 02/10/22   Farrel Gordon, DO  insulin degludec (TRESIBA FLEXTOUCH) 100  UNIT/ML FlexTouch Pen Inject 12 Units into the skin daily. 02/10/22   Farrel Gordon, DO  Insulin Pen Needle (B-D ULTRAFINE III SHORT PEN) 31G X 8 MM MISC 1 each by Does not apply route daily. Diagnosis code: E11.9. Used to inject insulin daily. 12/03/21   Maudie Mercury, MD  ketoconazole (NIZORAL) 2 % cream Apply 1 Application topically daily. Patient not taking: Reported on 07/31/2022 02/10/22   Farrel Gordon, DO  levothyroxine (SYNTHROID) 100 MCG tablet Take 1 tablet (100 mcg total) by mouth daily before breakfast. 07/29/22 10/27/22  Dessa Phi, DO  OneTouch Delica Lancets 15V MISC Check blood sugar 3 times a day 03/14/20   Maudie Mercury, MD  pantoprazole (PROTONIX) 40 MG tablet TAKE 1 TABLET BY MOUTH EVERY DAY Patient taking differently: Take 40 mg by mouth daily. 07/14/22   Farrel Gordon, DO  pregabalin (LYRICA) 150 MG capsule TAKE 1 CAPSULE BY MOUTH TWO TIMES A DAY 03/10/22   Farrel Gordon, DO  sertraline (ZOLOFT) 100 MG tablet TAKE 2 TABLETS BY MOUTH EVERY DAY 05/04/22   Farrel Gordon, DO  simvastatin (ZOCOR) 40 MG tablet TAKE 1 TABLET BY MOUTH EVERY DAY 07/26/22   Farrel Gordon, DO  tiZANidine (ZANAFLEX) 4 MG tablet Take 1 tablet (4 mg total) by mouth at  bedtime. Patient taking differently: Take 4 mg by mouth See admin instructions. Take 4 mg in the morning and 4 mg at bedtime 07/29/22 10/27/22  Dessa Phi, DO  VITAMIN E PO Take 400 Units by mouth 2 (two) times daily.    [provider]    Physical Exam: BP 117/81 (BP Location: Left Arm)   Pulse (!) 110   Temp 97.8 F (36.6 C)   Resp 20   Ht '5\' 9"'$  (1.753 m)   Wt 72.6 kg   SpO2 99%   BMI 23.63 kg/m   General: 77 y.o. year-old female well developed well nourished in no acute distress.  Alert and oriented x3. HEENT: NCAT, EOMI, dry mucous membrane Neck: Supple, trachea medial Cardiovascular: Regular rate and rhythm with no rubs or gallops.  No thyromegaly or JVD noted.  No lower extremity edema. 2/4 pulses in all 4 extremities. Respiratory: Clear to auscultation with no wheezes or rales. Good inspiratory effort. Abdomen: Soft, tender to palpation.  Nondistended with normal bowel sounds x4 quadrants. Muskuloskeletal: Right foot was plantarflexed and there was atrophy of right lower extremity.  Surgical scar noted on left knee.  Neuro: CN II-XII intact, sensation, reflexes intact Skin: No ulcerative lesions noted or rashes Psychiatry: Mood is appropriate for condition and setting          Labs on Admission:  Basic Metabolic Panel: Recent Labs  Lab 07/29/22 0451 07/31/22 1753 08/02/22 1636  NA 141 141 141  K 3.7 4.1 4.4  CL 109 105 102  CO2 '26 26 24  '$ GLUCOSE 191* 187* 179*  BUN '17 13 15  '$ CREATININE 1.17* 0.92 1.18*  CALCIUM 8.3* 9.4 9.5   Liver Function Tests: Recent Labs  Lab 07/31/22 1753 08/02/22 1636  AST 36 51*  ALT 19 33  ALKPHOS 61 62  BILITOT 0.5 1.0  PROT 6.5 6.7  ALBUMIN 3.5 3.7   Recent Labs  Lab 07/31/22 1753 08/02/22 1636  LIPASE 28 23   No results for input(s): "AMMONIA" in the last 168 hours. CBC: Recent Labs  Lab 07/31/22 1753 08/02/22 1636  WBC 6.7 7.0  NEUTROABS 4.7 4.9  HGB 13.6 13.2  HCT 42.0 41.1  MCV 96.8 97.4  PLT  243 267   Cardiac Enzymes: No results for input(s): "CKTOTAL", "CKMB", "CKMBINDEX", "TROPONINI" in the last 168 hours.  BNP (last 3 results) No results for input(s): "BNP" in the last 8760 hours.  ProBNP (last 3 results) No results for input(s): "PROBNP" in the last 8760 hours.  CBG: Recent Labs  Lab 07/27/22 1719 07/28/22 1330 07/28/22 1617 07/29/22 0732 07/29/22 1707  GLUCAP 121* 216* 330* 161* 186*    Radiological Exams on Admission: No results found.  EKG: I independently viewed the EKG done and my findings are as followed: Normal sinus rhythm at a rate of 56 bpm with RBBB and LPFB  Assessment/Plan Present on Admission:  Dehydration  Chronic kidney disease, stage 3a ([place])  Hypothyroidism (acquired)  Mixed hyperlipidemia  Active Problems:   Mixed hyperlipidemia   Chronic kidney disease, stage 3a (HCC)   Hypothyroidism (acquired)   Dehydration   Abdominal pain   Nausea   Type 2 diabetes mellitus with hyperglycemia (HCC)   Mood disorder (HCC)  Abdominal pain and nausea Continue IV fentanyl Continue Protonix Continue IV NS at 75 mLs/Hr Continue IV Compazine p.r.n. Continue clear liquid diet with plan to advanced diet as tolerated  Dehydration Continue IV hydration  T2DM with hyperglycemia Continue Semglee 6 units nightly and adjust dose accordingly Continue ISS and hypoglycemia protocol  CKD 3A Creatinine 1.18 (creatinine is within baseline range) Renally adjust medications, avoid nephrotoxic agents/dehydration/hypotension  Chronic right leg weakness and immobility Patient is bedbound and lives with family Continue pregabalin and Zanaflex  Mixed hyperlipidemia Continue Zocor and Zetia  Mood disorder Continue Zoloft  Acquired hypothyroidism Continue Synthroid   DVT prophylaxis: Continue Lovenox  Code Status: Full code  Family Communication: Sister at bedside (all questions answered to satisfaction)  Consults: None  Severity of  Illness: The appropriate patient status for this patient is OBSERVATION. Observation status is judged to be reasonable and necessary in order to provide the required intensity of service to ensure the patient's safety. The patient's presenting symptoms, physical exam findings, and initial radiographic and laboratory data in the context of their medical condition is felt to place them at decreased risk for further clinical deterioration. Furthermore, it is anticipated that the patient will be medically stable for discharge from the hospital within 2 midnights of admission.   Author: Bernadette Hoit, DO 08/03/2022 9:09 AM  For on call review [email or web address].

## 2022-08-03 NOTE — Progress Notes (Signed)
  PROGRESS NOTE  Patient admitted earlier this morning. See H&P.   Patient was recently admitted to the hospital 1/22 to 1/26 due to nausea, diarrhea and abdominal pain.  She had suspected gastroenteritis, improved and was discharged home.  She presented back to the ER 1/27 with complaints of abdominal and coccyx pain.  She was discharged home from the ED at that time.  She returned back on 1/29 with complaints of periumbilical abdominal pain and nausea.  Patient seen in the emergency department.  Sister at bedside.  Since her discharge from the hospital on 1/26, patient has had very poor oral intake.  No vomiting but continued to have nausea.  No diarrhea.  On exam, she had 9/10 constant central abdominal pain, out of proportion to exam. Her abdomen remains soft.   Unclear etiology of abdominal pain.  She has had 2 CT abdomen pelvis without acute abnormality found. Check CTA A/P to rule out mesenteric ischemia.  Check lactic acid Continue IV fluid Pain control, antiemetic UA negative   Status is: Inpatient Remains inpatient appropriate because: CTA work up and IVF    Dessa Phi, DO [facility] 08/03/2022, 10:42 AM  Available via Epic secure chat 7am-7pm After these hours, please refer to coverage provider listed on [email or web address]

## 2022-08-04 ENCOUNTER — Encounter: Payer: No Typology Code available for payment source | Admitting: Student

## 2022-08-04 ENCOUNTER — Telehealth: Payer: Self-pay | Admitting: *Deleted

## 2022-08-04 DIAGNOSIS — E119 Type 2 diabetes mellitus without complications: Secondary | ICD-10-CM | POA: Diagnosis not present

## 2022-08-04 DIAGNOSIS — N1831 Chronic kidney disease, stage 3a: Secondary | ICD-10-CM | POA: Diagnosis not present

## 2022-08-04 DIAGNOSIS — R1033 Periumbilical pain: Secondary | ICD-10-CM | POA: Diagnosis not present

## 2022-08-04 DIAGNOSIS — E039 Hypothyroidism, unspecified: Secondary | ICD-10-CM | POA: Diagnosis not present

## 2022-08-04 DIAGNOSIS — E1165 Type 2 diabetes mellitus with hyperglycemia: Secondary | ICD-10-CM | POA: Diagnosis not present

## 2022-08-04 LAB — PHOSPHORUS: Phosphorus: 3.9 mg/dL (ref 2.5–4.6)

## 2022-08-04 LAB — COMPREHENSIVE METABOLIC PANEL
ALT: 17 U/L (ref 0–44)
AST: 19 U/L (ref 15–41)
Albumin: 2.8 g/dL — ABNORMAL LOW (ref 3.5–5.0)
Alkaline Phosphatase: 47 U/L (ref 38–126)
Anion gap: 3 — ABNORMAL LOW (ref 5–15)
BUN: 15 mg/dL (ref 8–23)
CO2: 29 mmol/L (ref 22–32)
Calcium: 8.6 mg/dL — ABNORMAL LOW (ref 8.9–10.3)
Chloride: 105 mmol/L (ref 98–111)
Creatinine, Ser: 1.29 mg/dL — ABNORMAL HIGH (ref 0.44–1.00)
GFR, Estimated: 43 mL/min — ABNORMAL LOW (ref >60)
Glucose, Bld: 167 mg/dL — ABNORMAL HIGH (ref 70–99)
Potassium: 3.2 mmol/L — ABNORMAL LOW (ref 3.5–5.1)
Sodium: 137 mmol/L (ref 135–145)
Total Bilirubin: 0.3 mg/dL (ref 0.3–1.2)
Total Protein: 5.1 g/dL — ABNORMAL LOW (ref 6.5–8.1)

## 2022-08-04 LAB — CBC
HCT: 33.3 % — ABNORMAL LOW (ref 36.0–46.0)
Hemoglobin: 10.6 g/dL — ABNORMAL LOW (ref 12.0–15.0)
MCH: 31.5 pg (ref 26.0–34.0)
MCHC: 31.8 g/dL (ref 30.0–36.0)
MCV: 98.8 fL (ref 80.0–100.0)
Platelets: 218 10*3/uL (ref 150–400)
RBC: 3.37 MIL/uL — ABNORMAL LOW (ref 3.87–5.11)
RDW: 13.2 % (ref 11.5–15.5)
WBC: 6.6 10*3/uL (ref 4.0–10.5)
nRBC: 0 % (ref 0.0–0.2)

## 2022-08-04 LAB — GLUCOSE, CAPILLARY
Glucose-Capillary: 155 mg/dL — ABNORMAL HIGH (ref 70–99)
Glucose-Capillary: 230 mg/dL — ABNORMAL HIGH (ref 70–99)
Glucose-Capillary: 171 mg/dL — ABNORMAL HIGH (ref 70–99)
Glucose-Capillary: 236 mg/dL — ABNORMAL HIGH (ref 70–99)

## 2022-08-04 LAB — MAGNESIUM: Magnesium: 1.8 mg/dL (ref 1.7–2.4)

## 2022-08-04 MED ORDER — POTASSIUM CHLORIDE CRYS ER 20 MEQ PO TBCR
40.0000 meq | EXTENDED_RELEASE_TABLET | Freq: Two times a day (BID) | ORAL | Status: AC
  Administered 2022-08-04 (×2): 40 meq via ORAL
  Filled 2022-08-04 (×2): qty 2

## 2022-08-04 MED ORDER — SENNOSIDES-DOCUSATE SODIUM 8.6-50 MG PO TABS
1.0000 | ORAL_TABLET | Freq: Two times a day (BID) | ORAL | Status: DC
  Administered 2022-08-04 – 2022-08-05 (×4): 1 via ORAL
  Filled 2022-08-04 (×4): qty 1

## 2022-08-04 MED ORDER — OXYCODONE HCL 5 MG PO TABS
5.0000 mg | ORAL_TABLET | Freq: Four times a day (QID) | ORAL | Status: DC | PRN
  Administered 2022-08-05: 5 mg via ORAL
  Filled 2022-08-04: qty 1

## 2022-08-04 MED ORDER — SODIUM CHLORIDE 0.9 % IV SOLN: INTRAVENOUS | Status: DC

## 2022-08-04 MED ORDER — POTASSIUM CHLORIDE CRYS ER 20 MEQ PO TBCR: 40.0000 meq | EXTENDED_RELEASE_TABLET | Freq: Once | ORAL | Status: DC

## 2022-08-04 MED ORDER — POLYETHYLENE GLYCOL 3350 17 G PO PACK
17.0000 g | PACK | Freq: Every day | ORAL | Status: DC
  Administered 2022-08-04: 17 g via ORAL
  Filled 2022-08-04 (×2): qty 1

## 2022-08-04 NOTE — TOC Initial Note (Signed)
Transition of Care [facility]) - Initial/Assessment Note    Patient Details  Name: Toni Hill MRN: [id number] Date of Birth: 1945/12/14  Transition of Care [facility] CM/SW Contact:    Iona Beard, [place] Phone Number: 08/04/2022, 11:09 AM  Clinical Narrative:                 Pt is high risk for readmission. CSW attempted to contact pts sister x2, VM left requesting return call. CSW completed chart review and notes that pt recently admitted. Pt seen by PT at this admission and there was no follow up. Pt is from home with her sister. Pt is non-ambulatory/bed bound at baseline. TOC to follow.   Expected Discharge Plan: Home/Self Care Barriers to Discharge: Continued Medical Work up   Patient Goals and CMS Choice Patient states their goals for this hospitalization and ongoing recovery are:: return home CMS Medicare.gov Compare Post Acute Care list provided to:: Patient Choice offered to / list presented to : Patient      Expected Discharge Plan and Services In-house Referral: Clinical Social Work Discharge Planning Services: CM Consult   Living arrangements for the past 2 months: Single Family Home                                      Prior Living Arrangements/Services Living arrangements for the past 2 months: Single Family Home Lives with:: Relatives Patient language and need for interpreter reviewed:: Yes Do you feel safe going back to the place where you live?: Yes      Need for Family Participation in Patient Care: Yes (Comment) Care giver support system in place?: Yes (comment)   Criminal Activity/Legal Involvement Pertinent to Current Situation/Hospitalization: No - Comment as needed  Activities of Daily Living Home Assistive Devices/Equipment: Hospital bed ADL Screening (condition at time of admission) Patient's cognitive ability adequate to safely complete daily activities?: No Is the patient deaf or have difficulty hearing?: No Does the patient have  difficulty seeing, even when wearing glasses/contacts?: No Does the patient have difficulty concentrating, remembering, or making decisions?: No Patient able to express need for assistance with ADLs?: Yes Does the patient have difficulty dressing or bathing?: Yes Independently performs ADLs?: No Does the patient have difficulty walking or climbing stairs?: Yes Weakness of Legs: Both Weakness of Arms/Hands: Both  Permission Sought/Granted                  Emotional Assessment Appearance:: Appears stated age Attitude/Demeanor/Rapport: Engaged Affect (typically observed): Accepting   Alcohol / Substance Use: Not Applicable Psych Involvement: No (comment)  Admission diagnosis:  Dehydration [E86.0] Intractable nausea and vomiting [R11.2] Patient Active Problem List   Diagnosis Date Noted   Abdominal pain 08/03/2022   Nausea 08/03/2022   Type 2 diabetes mellitus with hyperglycemia ([place]) 08/03/2022   Mood disorder ([place]) 08/03/2022   GERD (gastroesophageal reflux disease) 07/27/2022   Common bile duct dilatation 07/27/2022   Dehydration 07/27/2022   Fungal infection of skin 02/11/2022   Flexion contracture of left knee 10/22/2021   Knee contracture, right 09/18/2021   Equinus contracture of right ankle 09/18/2021   Right leg weakness 06/12/2020   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    Deep vein thrombosis (DVT) of proximal vein of right lower extremity ([place]) 05/26/2020   Protein-calorie malnutrition, severe 01/04/2020   Diarrhea in adult patient 01/04/2020   Degenerative disc disease, lumbar  09/06/2019   Peripheral neuropathy 09/06/2019   Seborrheic keratoses 08/20/2019   Left breast mass 08/20/2019   Macrocytic anemia 03/08/2019   UTI (urinary tract infection) 03/04/2019   QT prolongation 01/25/2019   Bipolar 1 disorder (HCC)    Osteoarthritis of left knee 12/30/2018   Anxiety 12/30/2018   Chronic kidney disease, stage 3a ([place])  06/28/2016   Mixed hyperlipidemia 08/20/2015   Type 2 diabetes mellitus ([place]) 02/28/2014   Esophageal reflux 02/28/2014   Stress incontinence, female 02/28/2014   Hypothyroidism (acquired) 02/28/2014   Essential hypertension 11/17/2011   PCP:  Farrel Gordon, DO Pharmacy:   [id number] [place], [place] - [address] [place] [id number]: [phone]: [phone]    Social Determinants of Health (SDOH) Social History: SDOH Screenings   Food Insecurity: No Food Insecurity (08/03/2022)  Housing: Low Risk  (08/03/2022)  Transportation Needs: No Transportation Needs (08/03/2022)  Utilities: Not At Risk (08/03/2022)  Alcohol Screen: Low Risk  (02/10/2022)  Depression (PHQ2-9): Low Risk  (02/10/2022)  Financial Resource Strain: Low Risk  (02/10/2022)  Physical Activity: Inactive (02/10/2022)  Social Connections: Socially Isolated (02/10/2022)  Stress: Stress Concern Present (02/10/2022)  Tobacco Use: Low Risk  (08/03/2022)   SDOH Interventions:     Readmission Risk Interventions    08/04/2022   11:08 AM  Readmission Risk Prevention Plan  Transportation Screening Complete  [place]  Social Work Consult for [place]anning/Counseling Complete  Palliative Care Screening Not Applicable  Medication Review (Press photographer Complete

## 2022-08-04 NOTE — Telephone Encounter (Signed)
Spoke with sister and with patient regarding her PCS,who is at present in the hospital. Patient is with [facility]. Patient's sister is her caregiver. Called [facility] (health department [phone]) lvm for return call as to hours per month patient is getting.

## 2022-08-04 NOTE — Progress Notes (Signed)
PROGRESS NOTE  Toni Hill:[id number] DOB: 1946-01-09 DOA: 08/02/2022 PCP: Farrel Gordon, DO  HPI/Recap of past 24 hours: Toni Hill is a 77 y.o. female with medical history significant of hypertension, hyperlipidemia, hypothyroidism, T2DM, GERD, bipolar 1 disorder who presents to the emergency department accompanied by sister due to nausea and abdominal pain.   Patient was admitted from 1/22 to 1/26 due to diarrhea associated to be secondary to suspected gastroenteritis and sepsis secondary to UTI versus gastroenteritis which was treated with Rocephin prior to being transitioned to fosfomycin. Patient presented  to the ED on January 27 with complaint of abdominal and coccyx pain and she returns again with same pain.  In the ED, workup was unremarkable. Hospitalist was asked to admit patient for further evaluation and management.    Today, patient denies any new complaints, still with some abdominal pain, denies any nausea/vomiting.  Patient does report significant constipation, with possible overflow incontinence resulting as diarrhea.  Sister at bedside, discussed plan of care.  Sister and patient were concerned about patient unable to move her right lower extremity for the past 2 years, and unable to straighten her left lower extremity as well.  Patient is bedbound and only can be transported using an ambulance.  Assessment/Plan: Active Problems:   Mixed hyperlipidemia   Chronic kidney disease, stage 3a (HCC)   Hypothyroidism (acquired)   Dehydration   Abdominal pain   Nausea   Type 2 diabetes mellitus with hyperglycemia (HCC)   Mood disorder (HCC)   Chronic abdominal pain  Could this be related to chronic constipation with possible overflow incontinence resulted as diarrhea Multiple CT abdomen including CT angio A/P has been unremarkable, noted stool in colon Bowel regimen ordered Continue Protonix Continue IV NS at 75 mLs/Hr Continue IV Compazine p.r.n. Advance to full  liquid diet  Normocytic anemia Drop in hemoglobin, could be hemodilution Repeat CBC in a.m. and anemia panel If dropping, will order FOBT to evaluate for any GI bleeding, although BUN WNL Daily CBC  AKI on CKD 3A Creatinine 1.29 IV fluids Renally adjust medications, avoid nephrotoxic agents/dehydration/hypotension Daily BMP  Hypokalemia Replace as needed   T2DM with hyperglycemia Last A1c 8.2, will repeat Continue Semglee 6 units nightly and adjust dose accordingly Continue ISS and hypoglycemia protocol   Chronic right leg weakness and immobility Patient is bedbound and lives with family Continue pregabalin and Zanaflex   Mixed hyperlipidemia Continue Zocor and Zetia   Mood disorder/bipolar Continue Zoloft   Acquired hypothyroidism Last TSH 0.23 Repeat TSH, free T4 Continue Synthroid     Estimated body mass index is 23.63 kg/m as calculated from the following:   Height as of this encounter: '5\' 9"'$  (1.753 m).   Weight as of this encounter: 72.6 kg.     Code Status: full  Family Communication: Sister at bedside who is her caregiver  Disposition Plan: Status is: Inpatient Remains inpatient appropriate because: Level of care      Consultants: None  Procedures: None  Antimicrobials: None  DVT prophylaxis: Lovenox   Objective: Vitals:   08/03/22 1846 08/03/22 2221 08/04/22 0421 08/04/22 1426  BP: (!) 110/50 (!) 107/52 (!) 92/47 120/66  Pulse: 77 69 61 73  Resp:  '18 16 17  '$ Temp:  97.8 F (36.6 C) 98.7 F (37.1 C)   TempSrc:  Oral    SpO2: 98% 96% 93% 95%  Weight:      Height:        Intake/Output Summary (Last 24  hours) at 08/04/2022 1758 Last data filed at 08/04/2022 1500 Gross per 24 hour  Intake 480 ml  Output 300 ml  Net 180 ml   Filed Weights   08/02/22 1601  Weight: 72.6 kg    Exam: General: NAD  Cardiovascular: S1, S2 present Respiratory: CTAB Abdomen: Soft, nontender, nondistended, bowel sounds present Musculoskeletal:  No bilateral pedal edema noted, RLE hyperextended Skin: Normal Psychiatry: Normal mood     Data Reviewed: CBC: Recent Labs  Lab 07/31/22 1753 08/02/22 1636 08/03/22 0932 08/04/22 0438  WBC 6.7 7.0 6.8 6.6  NEUTROABS 4.7 4.9  --   --   HGB 13.6 13.2 12.5 10.6*  HCT 42.0 41.1 39.6 33.3*  MCV 96.8 97.4 99.0 98.8  PLT 243 267 235 245   Basic Metabolic Panel: Recent Labs  Lab 07/29/22 0451 07/31/22 1753 08/02/22 1636 08/03/22 0932 08/04/22 0438  NA 141 141 141  --  137  K 3.7 4.1 4.4  --  3.2*  CL 109 105 102  --  105  CO2 '26 26 24  '$ --  29  GLUCOSE 191* 187* 179*  --  167*  BUN '17 13 15  '$ --  15  CREATININE 1.17* 0.92 1.18* 1.13* 1.29*  CALCIUM 8.3* 9.4 9.5  --  8.6*  MG  --   --   --   --  1.8  PHOS  --   --   --   --  3.9   GFR: Estimated Creatinine Clearance: 39.4 mL/min (A) (by C-G formula based on SCr of 1.29 mg/dL (H)). Liver Function Tests: Recent Labs  Lab 07/31/22 1753 08/02/22 1636 08/04/22 0438  AST 36 51* 19  ALT 19 33 17  ALKPHOS 61 62 47  BILITOT 0.5 1.0 0.3  PROT 6.5 6.7 5.1*  ALBUMIN 3.5 3.7 2.8*   Recent Labs  Lab 07/31/22 1753 08/02/22 1636  LIPASE 28 23   No results for input(s): "AMMONIA" in the last 168 hours. Coagulation Profile: No results for input(s): "INR", "PROTIME" in the last 168 hours. Cardiac Enzymes: No results for input(s): "CKTOTAL", "CKMB", "CKMBINDEX", "TROPONINI" in the last 168 hours. BNP (last 3 results) No results for input(s): "PROBNP" in the last 8760 hours. HbA1C: No results for input(s): "HGBA1C" in the last 72 hours. CBG: Recent Labs  Lab 08/03/22 1605 08/03/22 2222 08/04/22 0803 08/04/22 1132 08/04/22 1659  GLUCAP 339* 244* 155* 230* 171*   Lipid Profile: No results for input(s): "CHOL", "HDL", "LDLCALC", "TRIG", "CHOLHDL", "LDLDIRECT" in the last 72 hours. Thyroid Function Tests: No results for input(s): "TSH", "T4TOTAL", "FREET4", "T3FREE", "THYROIDAB" in the last 72 hours. Anemia Panel: No  results for input(s): "VITAMINB12", "FOLATE", "FERRITIN", "TIBC", "IRON", "RETICCTPCT" in the last 72 hours. Urine analysis:    Component Value Date/Time   COLORURINE YELLOW 08/02/2022 2150   APPEARANCEUR CLEAR 08/02/2022 2150   APPEARANCEUR Cloudy (A) 02/03/2021 1025   LABSPEC 1.018 08/02/2022 2150   PHURINE 5.0 08/02/2022 2150   GLUCOSEU NEGATIVE 08/02/2022 2150   HGBUR NEGATIVE 08/02/2022 2150   BILIRUBINUR NEGATIVE 08/02/2022 2150   BILIRUBINUR Negative 02/03/2021 1025   KETONESUR 80 (A) 08/02/2022 2150   PROTEINUR 30 (A) 08/02/2022 2150   UROBILINOGEN 0.2 04/03/2019 1609   NITRITE NEGATIVE 08/02/2022 2150   LEUKOCYTESUR NEGATIVE 08/02/2022 2150   Sepsis Labs: '@LABRCNTIP'$ (procalcitonin:4,lacticidven:4)  ) Recent Results (from the past 240 hour(s))  Urine Culture     Status: Abnormal   Collection Time: 07/26/22 11:58 PM   Specimen: Urine, Clean Catch  Result  Value Ref Range Status   Specimen Description   Final    URINE, CLEAN CATCH Performed at [facility], [address], [place], [place] [address]    Special Requests   Final    NONE Performed at [facility], [address], [place], [place] [address]    Culture MULTIPLE SPECIES PRESENT, SUGGEST RECOLLECTION (A)  Final   Report Status 07/28/2022 FINAL  Final      Studies: DG Foot 2 Views Right  Result Date: 08/03/2022 CLINICAL DATA:  Pain EXAM: RIGHT FOOT - 2 VIEW COMPARISON:  Right foot x-ray 12/02/2020 FINDINGS: Overlying external artifact significantly limits evaluation. The bones are diffusely osteopenic. There is no definite acute fracture or dislocation identified. Tibial intramedullary nail is present. There is a healed distal fibular fracture. Soft tissues are grossly within normal limits. IMPRESSION: Overlying external artifact significantly limits evaluation. No definite acute fracture or dislocation identified. Electronically Signed   By: Ronney Asters M.D.   On: 08/03/2022 18:54    Scheduled Meds:   enoxaparin (LOVENOX) injection  40 mg Subcutaneous Q24H   ezetimibe  10 mg Oral QPM   influenza vaccine adjuvanted  0.5 mL Intramuscular Tomorrow-1000   insulin aspart  0-9 Units Subcutaneous TID WC   insulin glargine-yfgn  6 Units Subcutaneous QHS   levothyroxine  100 mcg Oral QAC breakfast   pantoprazole  40 mg Oral Daily   polyethylene glycol  17 g Oral Daily   potassium chloride  40 mEq Oral BID   pregabalin  150 mg Oral BID   senna-docusate  1 tablet Oral BID   sertraline  200 mg Oral Daily   simvastatin  40 mg Oral Daily   tiZANidine  4 mg Oral QHS    Continuous Infusions:   LOS: 2 days     Alma Friendly, MD [facility]  If 7PM-7AM, please contact night-coverage [email or web address] 08/04/2022, 5:58 PM

## 2022-08-05 ENCOUNTER — Inpatient Hospital Stay (HOSPITAL_COMMUNITY): Payer: No Typology Code available for payment source

## 2022-08-05 DIAGNOSIS — K5900 Constipation, unspecified: Secondary | ICD-10-CM | POA: Diagnosis not present

## 2022-08-05 DIAGNOSIS — R1084 Generalized abdominal pain: Secondary | ICD-10-CM | POA: Diagnosis not present

## 2022-08-05 DIAGNOSIS — Z794 Long term (current) use of insulin: Secondary | ICD-10-CM | POA: Diagnosis not present

## 2022-08-05 DIAGNOSIS — E1165 Type 2 diabetes mellitus with hyperglycemia: Secondary | ICD-10-CM | POA: Diagnosis not present

## 2022-08-05 LAB — CBC WITH DIFFERENTIAL/PLATELET
Abs Immature Granulocytes: 0.01 10*3/uL (ref 0.00–0.07)
Basophils Absolute: 0 10*3/uL (ref 0.0–0.1)
Basophils Relative: 1 %
Eosinophils Absolute: 0 10*3/uL (ref 0.0–0.5)
Eosinophils Relative: 0 %
HCT: 35.8 % — ABNORMAL LOW (ref 36.0–46.0)
Hemoglobin: 11.2 g/dL — ABNORMAL LOW (ref 12.0–15.0)
Immature Granulocytes: 0 %
Lymphocytes Relative: 34 %
Lymphs Abs: 1.6 10*3/uL (ref 0.7–4.0)
MCH: 31.3 pg (ref 26.0–34.0)
MCHC: 31.3 g/dL (ref 30.0–36.0)
MCV: 100 fL (ref 80.0–100.0)
Monocytes Absolute: 0.6 10*3/uL (ref 0.1–1.0)
Monocytes Relative: 12 %
Neutro Abs: 2.6 10*3/uL (ref 1.7–7.7)
Neutrophils Relative %: 53 %
Platelets: 220 10*3/uL (ref 150–400)
RBC: 3.58 MIL/uL — ABNORMAL LOW (ref 3.87–5.11)
RDW: 13.1 % (ref 11.5–15.5)
WBC: 4.8 10*3/uL (ref 4.0–10.5)
nRBC: 0 % (ref 0.0–0.2)

## 2022-08-05 LAB — HEMOGLOBIN A1C
Hgb A1c MFr Bld: 7.8 % — ABNORMAL HIGH (ref 4.8–5.6)
Mean Plasma Glucose: 177.16 mg/dL

## 2022-08-05 LAB — GLUCOSE, CAPILLARY
Glucose-Capillary: 154 mg/dL — ABNORMAL HIGH (ref 70–99)
Glucose-Capillary: 183 mg/dL — ABNORMAL HIGH (ref 70–99)
Glucose-Capillary: 239 mg/dL — ABNORMAL HIGH (ref 70–99)
Glucose-Capillary: 247 mg/dL — ABNORMAL HIGH (ref 70–99)

## 2022-08-05 LAB — BASIC METABOLIC PANEL
Anion gap: 7 (ref 5–15)
BUN: 12 mg/dL (ref 8–23)
CO2: 27 mmol/L (ref 22–32)
Calcium: 8.5 mg/dL — ABNORMAL LOW (ref 8.9–10.3)
Chloride: 106 mmol/L (ref 98–111)
Creatinine, Ser: 1.09 mg/dL — ABNORMAL HIGH (ref 0.44–1.00)
GFR, Estimated: 53 mL/min — ABNORMAL LOW (ref >60)
Glucose, Bld: 173 mg/dL — ABNORMAL HIGH (ref 70–99)
Potassium: 4.1 mmol/L (ref 3.5–5.1)
Sodium: 140 mmol/L (ref 135–145)

## 2022-08-05 LAB — IRON AND TIBC
Iron: 31 ug/dL (ref 28–170)
Saturation Ratios: 15 % (ref 10.4–31.8)
TIBC: 201 ug/dL — ABNORMAL LOW (ref 250–450)
UIBC: 170 ug/dL

## 2022-08-05 LAB — VITAMIN B12: Vitamin B-12: 261 pg/mL (ref 180–914)

## 2022-08-05 LAB — TSH: TSH: 5.868 u[IU]/mL — ABNORMAL HIGH (ref 0.350–4.500)

## 2022-08-05 LAB — FOLATE: Folate: 13.2 ng/mL (ref >5.9)

## 2022-08-05 LAB — FERRITIN: Ferritin: 99 ng/mL (ref 11–307)

## 2022-08-05 LAB — T4, FREE: Free T4: 0.73 ng/dL (ref 0.61–1.12)

## 2022-08-05 MED ORDER — VITAMIN B-12 1000 MCG PO TABS
1000.0000 ug | ORAL_TABLET | Freq: Every day | ORAL | Status: DC
  Administered 2022-08-05 – 2022-08-06 (×2): 1000 ug via ORAL
  Filled 2022-08-05 (×2): qty 1

## 2022-08-05 MED ORDER — POLYETHYLENE GLYCOL 3350 17 G PO PACK: 17.0000 g | PACK | Freq: Two times a day (BID) | ORAL | Status: DC

## 2022-08-05 NOTE — Progress Notes (Signed)
PROGRESS NOTE  [name]:[id number] DOB: [birth year] DOA: 08/02/2022 PCP: Toni Gordon, DO  HPI/Recap of past 24 hours: Toni Hill is a 77 y.o. female with medical history significant of hypertension, hyperlipidemia, hypothyroidism, T2DM, GERD, bipolar 1 disorder who presents to the emergency department accompanied by sister due to nausea and abdominal pain.   Patient was admitted from 1/22 to 1/26 due to diarrhea associated to be secondary to suspected gastroenteritis and sepsis secondary to UTI versus gastroenteritis which was treated with Rocephin prior to being transitioned to fosfomycin. Patient presented  to the ED on January 27 with complaint of abdominal and coccyx pain and she returns again with same pain.  In the ED, workup was unremarkable. Hospitalist was asked to admit patient for further evaluation and management.    Today, patient still reporting some abdominal discomfort, moderate stool burden noted on abdominal x-ray.  Bowel regimen.   Assessment/Plan: Active Problems:   Mixed hyperlipidemia   Chronic kidney disease, stage 3a (HCC)   Hypothyroidism (acquired)   Dehydration   Abdominal pain   Nausea   Type 2 diabetes mellitus with hyperglycemia (HCC)   Mood disorder (HCC)   Chronic abdominal pain  Could this be related to chronic constipation with possible overflow incontinence resulted as diarrhea Multiple CT abdomen including CT angio A/P has been unremarkable, noted stool in colon Abdominal x-ray today still shows moderate stool burden Bowel regimen ordered Continue Protonix Continue IV NS at 75 mLs/Hr Continue IV Compazine p.r.n. Advance to full liquid diet  Normocytic anemia Anemia panel WNL, except for Vit b12-->261 Vit B12 supplementation  Daily CBC  AKI on CKD 3A Creatinine 1.29 IV fluids Renally adjust medications, avoid nephrotoxic agents/dehydration/hypotension Daily BMP  Hypokalemia Replace as needed   T2DM with  hyperglycemia Last A1c 7.8 Continue Semglee nightly and adjust dose accordingly Continue ISS and hypoglycemia protocol   Chronic right leg weakness and immobility Patient is bedbound and lives with family Continue pregabalin and Zanaflex   Mixed hyperlipidemia Continue Zocor and Zetia   Mood disorder/bipolar Continue Zoloft   Acquired hypothyroidism Last TSH 5.8, free T4-->1.2, repeat as oupt Continue Synthroid     Estimated body mass index is 23.63 kg/m as calculated from the following:   Height as of this encounter: '5\' 9"'$  (1.753 m).   Weight as of this encounter: 72.6 kg.     Code Status: full  Family Communication: Sister at bedside who is her caregiver  Disposition Plan: Status is: Inpatient Remains inpatient appropriate because: Level of care      Consultants: None  Procedures: None  Antimicrobials: None  DVT prophylaxis: Lovenox   Objective: Vitals:   08/04/22 2056 08/05/22 0322 08/05/22 0338 08/05/22 1448  BP: (!) 129/55 (!) 91/50 (!) 103/51 (!) 147/62  Pulse: 73 63 64 83  Resp: '16 14  19  '$ Temp: 98.3 F (36.8 C) 98.6 F (37 C)  97.9 F (36.6 C)  TempSrc: Oral Oral  Oral  SpO2: 96% 97%  96%  Weight:      Height:        Intake/Output Summary (Last 24 hours) at 08/05/2022 1541 Last data filed at 08/05/2022 0900 Gross per 24 hour  Intake 1432.19 ml  Output 600 ml  Net 832.19 ml   Filed Weights   08/02/22 1601  Weight: 72.6 kg    Exam: General: NAD  Cardiovascular: S1, S2 present Respiratory: CTAB Abdomen: Soft, nontender, nondistended, bowel sounds present Musculoskeletal: No bilateral pedal edema noted, RLE hyperextended  Skin: Normal Psychiatry: Normal mood     Data Reviewed: CBC: Recent Labs  Lab 07/31/22 1753 08/02/22 1636 08/03/22 0932 08/04/22 0438 08/05/22 0426  WBC 6.7 7.0 6.8 6.6 4.8  NEUTROABS 4.7 4.9  --   --  2.6  HGB 13.6 13.2 12.5 10.6* 11.2*  HCT 42.0 41.1 39.6 33.3* 35.8*  MCV 96.8 97.4 99.0 98.8  100.0  PLT 243 267 235 218 938   Basic Metabolic Panel: Recent Labs  Lab 07/31/22 1753 08/02/22 1636 08/03/22 0932 08/04/22 0438 08/05/22 0426  NA 141 141  --  137 140  K 4.1 4.4  --  3.2* 4.1  CL 105 102  --  105 106  CO2 26 24  --  29 27  GLUCOSE 187* 179*  --  167* 173*  BUN 13 15  --  15 12  CREATININE 0.92 1.18* 1.13* 1.29* 1.09*  CALCIUM 9.4 9.5  --  8.6* 8.5*  MG  --   --   --  1.8  --   PHOS  --   --   --  3.9  --    GFR: Estimated Creatinine Clearance: 46.6 mL/min (A) (by C-G formula based on SCr of 1.09 mg/dL (H)). Liver Function Tests: Recent Labs  Lab 07/31/22 1753 08/02/22 1636 08/04/22 0438  AST 36 51* 19  ALT 19 33 17  ALKPHOS 61 62 47  BILITOT 0.5 1.0 0.3  PROT 6.5 6.7 5.1*  ALBUMIN 3.5 3.7 2.8*   Recent Labs  Lab 07/31/22 1753 08/02/22 1636  LIPASE 28 23   No results for input(s): "AMMONIA" in the last 168 hours. Coagulation Profile: No results for input(s): "INR", "PROTIME" in the last 168 hours. Cardiac Enzymes: No results for input(s): "CKTOTAL", "CKMB", "CKMBINDEX", "TROPONINI" in the last 168 hours. BNP (last 3 results) No results for input(s): "PROBNP" in the last 8760 hours. HbA1C: Recent Labs    08/05/22 0426  HGBA1C 7.8*   CBG: Recent Labs  Lab 08/04/22 1132 08/04/22 1659 08/04/22 2059 08/05/22 0754 08/05/22 1210  GLUCAP 230* 171* 236* 154* 183*   Lipid Profile: No results for input(s): "CHOL", "HDL", "LDLCALC", "TRIG", "CHOLHDL", "LDLDIRECT" in the last 72 hours. Thyroid Function Tests: Recent Labs    08/05/22 0426  TSH 5.868*   Anemia Panel: Recent Labs    08/05/22 0426  VITAMINB12 261  FOLATE 13.2  FERRITIN 99  TIBC 201*  IRON 31   Urine analysis:    Component Value Date/Time   COLORURINE YELLOW 08/02/2022 2150   APPEARANCEUR CLEAR 08/02/2022 2150   APPEARANCEUR Cloudy (A) 02/03/2021 1025   LABSPEC 1.018 08/02/2022 2150   PHURINE 5.0 08/02/2022 2150   GLUCOSEU NEGATIVE 08/02/2022 2150   HGBUR  NEGATIVE 08/02/2022 2150   BILIRUBINUR NEGATIVE 08/02/2022 2150   BILIRUBINUR Negative 02/03/2021 1025   KETONESUR 80 (A) 08/02/2022 2150   PROTEINUR 30 (A) 08/02/2022 2150   UROBILINOGEN 0.2 04/03/2019 1609   NITRITE NEGATIVE 08/02/2022 2150   LEUKOCYTESUR NEGATIVE 08/02/2022 2150   Sepsis Labs: '@LABRCNTIP'$ (procalcitonin:4,lacticidven:4)  ) Recent Results (from the past 240 hour(s))  Urine Culture     Status: Abnormal   Collection Time: 07/26/22 11:58 PM   Specimen: Urine, Clean Catch  Result Value Ref Range Status   Specimen Description   Final    URINE, CLEAN CATCH Performed at [facility], [address] [place], [place] [address]    Special Requests   Final    NONE Performed at [facility], [address] [place],  [place] [address]    Culture MULTIPLE SPECIES PRESENT, SUGGEST RECOLLECTION (A)  Final   Report Status 07/28/2022 FINAL  Final      Studies: DG Abd Portable 1V  Result Date: 08/05/2022 CLINICAL DATA:  Constipation EXAM: PORTABLE ABDOMEN - 1 VIEW COMPARISON:  None Available. FINDINGS: The bowel gas pattern is normal. There is moderate stool burden. Multiple phleboliths are seen throughout the pelvis. There are degenerative changes of the spine and right hip. IMPRESSION: Moderate stool burden. Nonobstructive bowel gas pattern. Electronically Signed   By: Ronney Asters M.D.   On: 08/05/2022 15:08    Scheduled Meds:  enoxaparin (LOVENOX) injection  40 mg Subcutaneous Q24H   ezetimibe  10 mg Oral QPM   insulin aspart  0-9 Units Subcutaneous TID WC   insulin glargine-yfgn  6 Units Subcutaneous QHS   levothyroxine  100 mcg Oral QAC breakfast   pantoprazole  40 mg Oral Daily   polyethylene glycol  17 g Oral BID   pregabalin  150 mg Oral BID   senna-docusate  1 tablet Oral BID   sertraline  200 mg Oral Daily   simvastatin  40 mg Oral Daily   tiZANidine  4 mg Oral QHS    Continuous Infusions:  sodium chloride 75 mL/hr at 08/04/22 1958     LOS: 3 days      Alma Friendly, MD [facility]  If 7PM-7AM, please contact night-coverage [email or web address] 08/05/2022, 3:41 PM

## 2022-08-05 NOTE — Care Management Important Message (Signed)
Important Message  Patient Details  Name: Toni Hill MRN: [id number] Date of Birth: 26-Jun-1946   Medicare Important Message Given:  N/A - LOS <3 / Initial given by admissions     Tommy Medal 08/05/2022, 11:29 AM

## 2022-08-06 DIAGNOSIS — R1084 Generalized abdominal pain: Secondary | ICD-10-CM | POA: Diagnosis not present

## 2022-08-06 DIAGNOSIS — E039 Hypothyroidism, unspecified: Secondary | ICD-10-CM | POA: Diagnosis not present

## 2022-08-06 DIAGNOSIS — E860 Dehydration: Secondary | ICD-10-CM | POA: Diagnosis not present

## 2022-08-06 LAB — BASIC METABOLIC PANEL
Anion gap: 7 (ref 5–15)
BUN: 14 mg/dL (ref 8–23)
CO2: 25 mmol/L (ref 22–32)
Calcium: 8.3 mg/dL — ABNORMAL LOW (ref 8.9–10.3)
Chloride: 106 mmol/L (ref 98–111)
Creatinine, Ser: 0.97 mg/dL (ref 0.44–1.00)
GFR, Estimated: >60 mL/min (ref >60)
Glucose, Bld: 246 mg/dL — ABNORMAL HIGH (ref 70–99)
Potassium: 4 mmol/L (ref 3.5–5.1)
Sodium: 138 mmol/L (ref 135–145)

## 2022-08-06 LAB — CBC WITH DIFFERENTIAL/PLATELET
Abs Immature Granulocytes: 0.02 10*3/uL (ref 0.00–0.07)
Basophils Absolute: 0 10*3/uL (ref 0.0–0.1)
Basophils Relative: 0 %
Eosinophils Absolute: 0 10*3/uL (ref 0.0–0.5)
Eosinophils Relative: 0 %
HCT: 35.2 % — ABNORMAL LOW (ref 36.0–46.0)
Hemoglobin: 10.8 g/dL — ABNORMAL LOW (ref 12.0–15.0)
Immature Granulocytes: 0 %
Lymphocytes Relative: 28 %
Lymphs Abs: 1.4 10*3/uL (ref 0.7–4.0)
MCH: 30.9 pg (ref 26.0–34.0)
MCHC: 30.7 g/dL (ref 30.0–36.0)
MCV: 100.6 fL — ABNORMAL HIGH (ref 80.0–100.0)
Monocytes Absolute: 0.5 10*3/uL (ref 0.1–1.0)
Monocytes Relative: 11 %
Neutro Abs: 3.1 10*3/uL (ref 1.7–7.7)
Neutrophils Relative %: 61 %
Platelets: 204 10*3/uL (ref 150–400)
RBC: 3.5 MIL/uL — ABNORMAL LOW (ref 3.87–5.11)
RDW: 13.1 % (ref 11.5–15.5)
WBC: 5 10*3/uL (ref 4.0–10.5)
nRBC: 0 % (ref 0.0–0.2)

## 2022-08-06 LAB — GLUCOSE, CAPILLARY
Glucose-Capillary: 217 mg/dL — ABNORMAL HIGH (ref 70–99)
Glucose-Capillary: 207 mg/dL — ABNORMAL HIGH (ref 70–99)
Glucose-Capillary: 180 mg/dL — ABNORMAL HIGH (ref 70–99)

## 2022-08-06 MED ORDER — SENNOSIDES-DOCUSATE SODIUM 8.6-50 MG PO TABS: 1.0000 | ORAL_TABLET | Freq: Two times a day (BID) | ORAL | 0 refills | Status: AC

## 2022-08-06 MED ORDER — SORBITOL 70 % SOLN
960.0000 mL | TOPICAL_OIL | Freq: Once | ORAL | Status: DC
  Filled 2022-08-06: qty 240

## 2022-08-06 MED ORDER — CYANOCOBALAMIN 1000 MCG PO TABS: 1000.0000 ug | ORAL_TABLET | Freq: Every day | ORAL | 0 refills | Status: AC

## 2022-08-06 NOTE — Progress Notes (Signed)
Tap water enema given, very little stool for output.

## 2022-08-06 NOTE — Inpatient Diabetes Management (Signed)
Inpatient Diabetes Program Recommendations  AACE/ADA: New Consensus Statement on Inpatient Glycemic Control (2015)  Target Ranges:  Prepandial:   less than 140 mg/dL      Peak postprandial:   less than 180 mg/dL (1-2 hours)      Critically ill patients:  140 - 180 mg/dL   Lab Results  Component Value Date   GLUCAP 217 (H) 08/06/2022   HGBA1C 7.8 (H) 08/05/2022    Review of Glycemic Control  Latest Reference Range & Units 08/05/22 07:54 08/05/22 12:10 08/05/22 16:59 08/05/22 20:57 08/06/22 07:41  Glucose-Capillary 70 - 99 mg/dL 154 (H) 183 (H) 239 (H) 247 (H) 217 (H)   Diabetes history: DM 2 Outpatient Diabetes medications: Novolog prn per family (6 units breakfast 10 units lunch 10 units supper), Tresiba prn per family (12 units Daily) Current orders for Inpatient glycemic control:  Semglee 6 units qhs Novolog 0-9 units tid   Inpatient Diabetes Program Recommendations:    -  Add Novolog hs scale -  Add Novolog 2 units tid meal coverage with parameters if pt is eating >50% of meals.  Thanks,  Tama Headings RN, MSN, BC-ADM Inpatient Diabetes Coordinator Team Pager [phone] (8a-5p)

## 2022-08-06 NOTE — TOC Transition Note (Addendum)
Transition of Care [facility]) - CM/SW Discharge Note   Patient Details  Name: Toni Hill MRN: [id number] Date of Birth: 1946-02-08  [facility] CM/SW Contact:  Shade Flood, LCSW Phone Number: 08/06/2022, 4:51 PM   Clinical Narrative:     MD states she plans to dc pt today. EMS transport form printed to floor and RN updated.   Updated pt's sister of plan for dc. She is aware and in agreement.  Final next level of care: Home/Self Care Barriers to Discharge: Barriers Resolved   Patient Goals and CMS Choice CMS Medicare.gov Compare Post Acute Care list provided to:: Patient Choice offered to / list presented to : Patient  Discharge Placement                         Discharge Plan and Services Additional resources added to the After Visit Summary for   In-house Referral: Clinical Social Work Discharge Planning Services: CM Consult                                 Social Determinants of Health (SDOH) Interventions SDOH Screenings   Food Insecurity: No Food Insecurity (08/03/2022)  Housing: Low Risk  (08/03/2022)  Transportation Needs: No Transportation Needs (08/03/2022)  Utilities: Not At Risk (08/03/2022)  Alcohol Screen: Low Risk  (02/10/2022)  Depression (PHQ2-9): Low Risk  (02/10/2022)  Financial Resource Strain: Low Risk  (02/10/2022)  Physical Activity: Inactive (02/10/2022)  Social Connections: Socially Isolated (02/10/2022)  Stress: Stress Concern Present (02/10/2022)  Tobacco Use: Low Risk  (08/03/2022)     Readmission Risk Interventions    08/04/2022   11:08 AM  Readmission Risk Prevention Plan  Transportation Screening Complete  HRI or [place] Complete  Social Work Consult for [place] Planning/Counseling Complete  Palliative Care Screening Not Applicable  Medication Review Press photographer) Complete

## 2022-08-06 NOTE — Care Management Important Message (Signed)
Important Message  Patient Details  Name: Toni Hill MRN: [id number] Date of Birth: 1946-02-15   Medicare Important Message Given:  Yes     Tommy Medal 08/06/2022, 11:01 AM

## 2022-08-06 NOTE — Discharge Summary (Signed)
Physician Discharge Summary   Patient: Toni Hill MRN: [id number] DOB: 08-04-1945  Admit date:     08/02/2022  Discharge date: 08/06/22  Discharge Physician: Alma Friendly   PCP: Farrel Gordon, DO   Recommendations at discharge:   Follow-up with PCP in 1 week  Discharge Diagnoses: Active Problems:   Mixed hyperlipidemia   Chronic kidney disease, stage 3a (HCC)   Hypothyroidism (acquired)   Dehydration   Abdominal pain   Nausea   Type 2 diabetes mellitus with hyperglycemia (HCC)   Mood disorder [facility])   Hospital Course: Toni Hill is a 77 y.o. female with medical history significant of hypertension, hyperlipidemia, hypothyroidism, T2DM, GERD, bipolar 1 disorder who presents to the emergency department accompanied by sister due to nausea and abdominal pain.   Patient was admitted from 1/22 to 1/26 due to diarrhea associated to be secondary to suspected gastroenteritis and sepsis secondary to UTI versus gastroenteritis which was treated with Rocephin prior to being transitioned to fosfomycin. Patient presented  to the ED on January 27 with complaint of abdominal and coccyx pain and she returns again with same pain.  In the ED, workup was unremarkable. Hospitalist was asked to admit patient for further evaluation and management.   Today, patient denies any worsening abdominal pain, has been having a couple of loose stools since on the bowel regimen.  Patient was able to tolerate breakfast, reported eating very well, denies any nausea/vomiting.  Overall significant improvement.  Advised to follow-up with PCP in 1 week.    Assessment and Plan:  Chronic abdominal pain  Could this be related to chronic constipation with possible overflow incontinence resulted as diarrhea Multiple CT abdomen including CT angio A/P has been unremarkable, noted stool in colon Abdominal x-ray today still shows moderate stool burden Discharge patient on bowel regimen Continue Protonix Follow-up  with PCP   Normocytic anemia Anemia panel WNL, except for Vit b12-->261 Vit B12 supplementation    AKI on CKD 3A Creatinine 1.29, resolved S/p IV fluids Renally adjust medications, avoid nephrotoxic agents/dehydration/hypotension   Hypokalemia Replaced as needed   T2DM with hyperglycemia Last A1c 7.8 Continue PTA home regimen   Chronic right leg weakness and immobility Patient is bedbound and lives with family Continue pregabalin and Zanaflex   Mixed hyperlipidemia Continue Zocor and Zetia   Mood disorder/bipolar Continue Zoloft   Acquired hypothyroidism Last TSH 5.8, free T4-->1.2, repeat as oupt Continue Synthroid      Pain control - [place] Controlled Substance Reporting System database was reviewed. and patient was instructed, not to drive, operate heavy machinery, perform activities at heights, swimming or participation in water activities or provide baby-sitting services while on Pain, Sleep and Anxiety Medications; until their outpatient Physician has advised to do so again. Also recommended to not to take more than prescribed Pain, Sleep and Anxiety Medications.    Consultants: None Procedures performed: None Disposition: Home Diet recommendation:  Carb modified diet   DISCHARGE MEDICATION: Allergies as of 08/06/2022       Reactions   Bactrim [sulfamethoxazole-trimethoprim]    Patient experienced nephrotoxicity in the setting of CKD. Strongly recommend against using this antibiotic in the future.    Cephalexin Itching   Gabapentin    Kidney issue   Morphine And Related Nausea And Vomiting   Vioxx [rofecoxib]    GI bleeding   Penicillins Hives, Itching   Has patient had a PCN reaction causing immediate rash, facial/tongue/throat swelling, SOB or lightheadedness with hypotension: No Has  patient had a PCN reaction causing severe rash involving mucus membranes or skin necrosis: No Has patient had a PCN reaction that required hospitalization:  no Has patient had a PCN reaction occurring within the last 10 years: No If all of the above answers are "NO", then may proceed with Cephalosporin use.        Medication List     TAKE these medications    Accu-Chek Guide test strip Generic drug: glucose blood Check blood sugar up to 4 times per day   Accu-Chek Guide w/Device Kit Check blood sugar 1 times per day.   acetaminophen 500 MG tablet Commonly known as: TYLENOL Take 1,000 mg by mouth in the morning and at bedtime.   B-D ULTRAFINE III SHORT PEN 31G X 8 MM Misc Generic drug: Insulin Pen Needle 1 each by Does not apply route daily. Diagnosis code: E11.9. Used to inject insulin daily.   Calcium 600/Vitamin D3 600-20 MG-MCG Tabs Generic drug: Calcium Carb-Cholecalciferol Take 1 tablet by mouth daily.   cyanocobalamin 1000 MCG tablet Take 1 tablet (1,000 mcg total) by mouth daily. Start taking on: August 07, 2022   diclofenac Sodium 1 % Gel Commonly known as: VOLTAREN APPLY 1 APPLICATION TOPICALLY 4 TIMES A DAY What changed: See the new instructions.   ezetimibe 10 MG tablet Commonly known as: ZETIA TAKE 1 TABLET BY MOUTH EVERY DAY IN THE EVENING What changed:  how much to take how to take this when to take this   insulin aspart 100 UNIT/ML injection Commonly known as: novoLOG Inject 6 Units into the skin daily with breakfast AND 10 Units daily with lunch AND 10 Units daily with supper.   levothyroxine 100 MCG tablet Commonly known as: Synthroid Take 1 tablet (100 mcg total) by mouth daily before breakfast.   OneTouch Delica Lancets 62G Misc Check blood sugar 3 times a day   Accu-Chek Softclix Lancets lancets Use to check blood sugar up to 4 times per day.   pantoprazole 40 MG tablet Commonly known as: PROTONIX TAKE 1 TABLET BY MOUTH EVERY DAY   pregabalin 150 MG capsule Commonly known as: LYRICA TAKE 1 CAPSULE BY MOUTH TWO TIMES A DAY What changed:  how much to take how to take this when to  take this additional instructions   senna-docusate 8.6-50 MG tablet Commonly known as: Senokot-S Take 1 tablet by mouth 2 (two) times daily for 5 days.   sertraline 100 MG tablet Commonly known as: ZOLOFT TAKE 2 TABLETS BY MOUTH EVERY DAY   simvastatin 40 MG tablet Commonly known as: ZOCOR TAKE 1 TABLET BY MOUTH EVERY DAY   tiZANidine 4 MG tablet Commonly known as: ZANAFLEX Take 1 tablet (4 mg total) by mouth at bedtime. What changed:  when to take this additional instructions   Tresiba FlexTouch 100 UNIT/ML FlexTouch Pen Generic drug: insulin degludec Inject 12 Units into the skin daily.   VITAMIN E PO Take 400 Units by mouth 2 (two) times daily.        Follow-up Information     Farrel Gordon, DO. Schedule an appointment as soon as possible for a visit in 1 week(s).   Specialty: Internal Medicine Contact information: [place] [address] [phone]                Discharge Exam: Danley Danker Weights   08/02/22 1601  Weight: 72.6 kg   General: NAD  Cardiovascular: S1, S2 present Respiratory: CTAB Abdomen: Soft, nontender, nondistended, bowel sounds present  Musculoskeletal: No bilateral pedal edema noted Skin: Normal Psychiatry: Normal mood   Condition at discharge: stable  The results of significant diagnostics from this hospitalization (including imaging, microbiology, ancillary and laboratory) are listed below for reference.   Imaging Studies: DG Abd Portable 1V  Result Date: 08/05/2022 CLINICAL DATA:  Constipation EXAM: PORTABLE ABDOMEN - 1 VIEW COMPARISON:  None Available. FINDINGS: The bowel gas pattern is normal. There is moderate stool burden. Multiple phleboliths are seen throughout the pelvis. There are degenerative changes of the spine and right hip. IMPRESSION: Moderate stool burden. Nonobstructive bowel gas pattern. Electronically Signed   By: Ronney Asters M.D.   On: 08/05/2022 15:08   DG Foot 2 Views Right  Result  Date: 08/03/2022 CLINICAL DATA:  Pain EXAM: RIGHT FOOT - 2 VIEW COMPARISON:  Right foot x-ray 12/02/2020 FINDINGS: Overlying external artifact significantly limits evaluation. The bones are diffusely osteopenic. There is no definite acute fracture or dislocation identified. Tibial intramedullary nail is present. There is a healed distal fibular fracture. Soft tissues are grossly within normal limits. IMPRESSION: Overlying external artifact significantly limits evaluation. No definite acute fracture or dislocation identified. Electronically Signed   By: Ronney Asters M.D.   On: 08/03/2022 18:54   CT Angio Abd/Pel w/ and/or w/o  Result Date: 08/03/2022 CLINICAL DATA:  Rule out acute mesenteric ischemia, abdominal pain EXAM: CTA ABDOMEN AND PELVIS WITHOUT AND WITH CONTRAST TECHNIQUE: Multidetector CT imaging of the abdomen and pelvis was performed using the standard protocol during bolus administration of intravenous contrast. Multiplanar reconstructed images and MIPs were obtained and reviewed to evaluate the vascular anatomy. RADIATION DOSE REDUCTION: This exam was performed according to the departmental dose-optimization program which includes automated exposure control, adjustment of the mA and/or kV according to patient size and/or use of iterative reconstruction technique. CONTRAST:  96m OMNIPAQUE IOHEXOL 350 MG/ML SOLN COMPARISON:  CT 07/31/2022 FINDINGS: VASCULAR Aorta: Moderate calcified plaque without aneurysm, dissection, or stenosis. Celiac: Patent without evidence of aneurysm, dissection, vasculitis or significant stenosis. SMA: Patent without evidence of aneurysm, dissection, vasculitis or significant stenosis. Replaced right hepatic arterial supply, an anatomic variant. Renals: Duplicated left, inferior dominant, both with calcified ostial plaque resulting in mild stenosis, patent distally. Single right, with partially calcified ostial plaque resulting in short segment stenosis of at least mild  severity, patent distally. IMA: Patent without evidence of aneurysm, dissection, vasculitis or significant stenosis. Inflow: Minimal calcified plaque in bilateral common and internal iliac arteries. External iliac arteries normal. Proximal Outflow: Normal Veins: Patent hepatic veins, portal vein, SMV, splenic vein, bilateral renal veins, iliac venous system and IVC. No venous pathology identified. Review of the MIP images confirms the above findings. NON-VASCULAR Lower chest: No pleural effusion. Trace pericardial fluid. Dependent atelectasis at the right lung base. Hepatobiliary: Cholecystectomy clips. Stable extrahepatic and central intrahepatic biliary ductal dilatation. No calcified choledocholithiasis. Pancreas: Mild diffuse atrophy without mass or ductal dilatation. Spleen: Normal in size without focal abnormality. Adrenals/Urinary Tract: 2 cm 30 HU left adrenal nodule present since 11/20/2016 consistent with benign adenoma; no follow-up indicated. Multiple bilateral renal cysts, some hyperdense, stable since previous exam. No hydronephrosis. Urinary bladder incompletely distended. Stomach/Bowel: Small hiatal hernia. The stomach is nondistended, unremarkable. Small bowel decompressed. Appendix not identified. The colon is partially distended by gas and fecal material, with a few distal descending and multiple sigmoid diverticula. No focal wall thickening or regional inflammatory change. Lymphatic: No abdominal or pelvic adenopathy. Reproductive: Status post hysterectomy. No adnexal masses. Other: Bilateral pelvic phleboliths. No ascites.  No free air. Presumed surgical mesh deep to the anterior abdominal wall in the midline above the umbilicus, stable since 67/67/2094. Musculoskeletal: Mild spondylitic changes in the lower lumbar spine. No acute findings. IMPRESSION: 1. No significant proximal mesenteric arterial occlusive disease to suggest etiology of mesenteric ischemia. 2. No acute findings. 3. Descending  and sigmoid diverticulosis. 4. Small hiatal hernia. 5. Stable biliary ductal dilatation post cholecystectomy. 6.  Aortic Atherosclerosis (ICD10-I70.0). Electronically Signed   By: Lucrezia Europe M.D.   On: 08/03/2022 18:29   CT ABDOMEN PELVIS W CONTRAST  Result Date: 07/31/2022 CLINICAL DATA:  Left lower quadrant abdominal pain. Nausea. Some dysuria. EXAM: CT ABDOMEN AND PELVIS WITH CONTRAST TECHNIQUE: Multidetector CT imaging of the abdomen and pelvis was performed using the standard protocol following bolus administration of intravenous contrast. RADIATION DOSE REDUCTION: This exam was performed according to the departmental dose-optimization program which includes automated exposure control, adjustment of the mA and/or kV according to patient size and/or use of iterative reconstruction technique. CONTRAST:  125m OMNIPAQUE IOHEXOL 300 MG/ML  SOLN COMPARISON:  07/26/2022 FINDINGS: Lower chest: The heart remains enlarged and displaced into the left hemithorax. The other mediastinal structures remain shifted to the left. Interval minimal bilateral pleural fluid and mild bilateral dependent atelectasis. Hepatobiliary: Stable cholecystectomy clips and extrahepatic and intrahepatic biliary ductal dilatation. Pancreas: Diffuse pancreatic atrophy without significant change. Spleen: Normal in size without focal abnormality. Adrenals/Urinary Tract: A left adrenal nodule is unchanged, measuring 2.0 x 1.4 cm. Normal-appearing right adrenal gland. A large number of bilateral renal cysts are again demonstrated. Tiny right renal calculus. These do not need imaging follow-up. Unremarkable ureters and urinary bladder. Stomach/Bowel: Multiple sigmoid and descending colon diverticula without evidence of diverticulitis. The appendix is not visualized. Unremarkable stomach and small bowel. Vascular/Lymphatic: Atheromatous arterial calcifications without aneurysm. No enlarged lymph nodes. Reproductive: Status post hysterectomy. No  adnexal masses. Other: No abdominal wall hernia or abnormality. No abdominopelvic ascites. Musculoskeletal: Lumbar and lower thoracic degenerative changes with changes of DISH. IMPRESSION: 1. No acute abnormality. 2. Colonic diverticulosis without evidence of diverticulitis. 3. Interval minimal bilateral pleural fluid and mild bilateral dependent atelectasis. 4. Stable cardiomegaly and mediastinal shift to the left. 5. Stable left adrenal adenoma. Electronically Signed   By: SClaudie ReveringM.D.   On: 07/31/2022 19:30   DG Chest Portable 1 View  Result Date: 07/31/2022 CLINICAL DATA:  Dyspnea. EXAM: PORTABLE CHEST 1 VIEW COMPARISON:  July 27, 2022. FINDINGS: The heart size and mediastinal contours are within normal limits. Left lung is clear. Elevated right hemidiaphragm is noted with minimal right perihilar subsegmental atelectasis. The visualized skeletal structures are unremarkable. IMPRESSION: Elevated right hemidiaphragm with minimal right perihilar subsegmental atelectasis. Electronically Signed   By: JMarijo ConceptionM.D.   On: 07/31/2022 17:54   DG Knee 1-2 Views Left  Result Date: 07/29/2022 CLINICAL DATA:  Chronic left knee pain and joint stiffness. EXAM: LEFT KNEE - 2 VIEW COMPARISON:  Two-view left knee radiograph 10/22/2021 FINDINGS: Left total knee arthroplasty noted. Components are well seated. Joint spacer is intact on the lateral view. No evidence for loosening. No significant joint effusion is present. No acute or healing fractures are present. IMPRESSION: Left total knee arthroplasty without radiographic evidence for complication. No acute abnormality. Electronically Signed   By: CSan MorelleM.D.   On: 07/29/2022 11:23   UKoreaAbdomen Limited RUQ (LIVER/GB)  Result Date: 07/27/2022 CLINICAL DATA:  Dilated bile duct EXAM: ULTRASOUND ABDOMEN LIMITED RIGHT UPPER QUADRANT COMPARISON:  CT abdomen/pelvis  1 day prior FINDINGS: Gallbladder: Surgically absent. Common bile duct: Diameter: 5  mm Liver: Evaluation of the left hepatic lobe is suboptimal due to bowel gas. No focal lesion is seen in the imaged liver. There is mild intrahepatic biliary ductal dilatation. Portal vein is patent on color Doppler imaging with normal direction of blood flow towards the liver. Other: None. IMPRESSION: Mild intrahepatic biliary ductal dilation with no obstructing lesion seen. Findings may be due to the history of cholecystectomy. Correlate with LFTs. Electronically Signed   By: Valetta Mole M.D.   On: 07/27/2022 09:40   DG Chest Portable 1 View  Result Date: 07/27/2022 CLINICAL DATA:  Nausea and abdominal pain. EXAM: PORTABLE CHEST 1 VIEW COMPARISON:  July 02, 2019 FINDINGS: The heart size and mediastinal contours are within normal limits. There is marked severity calcification of the aortic arch. Low lung volumes are noted. Mild linear atelectasis is seen within the perihilar region on the right. There is mild, stable elevation of the right hemidiaphragm. There is no evidence of a pleural effusion or pneumothorax. Radiopaque surgical clips are seen within the right upper quadrant. A chronic fracture deformity is seen involving the surgical neck of the proximal left humerus. Multilevel degenerative changes are seen throughout the thoracic spine. IMPRESSION: Low lung volumes with mild right perihilar linear atelectasis. Electronically Signed   By: Virgina Norfolk M.D.   On: 07/27/2022 01:15   CT ABDOMEN PELVIS W CONTRAST  Result Date: 07/26/2022 CLINICAL DATA:  Acute nonlocalized abdominal pain EXAM: CT ABDOMEN AND PELVIS WITH CONTRAST TECHNIQUE: Multidetector CT imaging of the abdomen and pelvis was performed using the standard protocol following bolus administration of intravenous contrast. RADIATION DOSE REDUCTION: This exam was performed according to the departmental dose-optimization program which includes automated exposure control, adjustment of the mA and/or kV according to patient size and/or  use of iterative reconstruction technique. CONTRAST:  41m OMNIPAQUE IOHEXOL 300 MG/ML SOLN IV. No oral contrast. COMPARISON:  12/17/2020 FINDINGS: Lower chest: Subsegmental atelectasis BILATERAL lower lobes. Hepatobiliary: Post cholecystectomy. Mild intrahepatic and extrahepatic biliary dilatation slightly increased from previous exam. No focal hepatic mass. Pancreas: Atrophic pancreas without mass Spleen: Mass normal appearance Adrenals/Urinary Tract: RIGHT adrenal gland normal. LEFT adrenal mass 2.0 x 1.7 cm, measuring 49 HU; on prior noncontrast exam this measured 0 HU consistent with lipid rich adenoma; no follow-up imaging recommended. Numerous BILATERAL renal cysts low to intermediate attenuation, largest lesion anterior RIGHT kidney 4.7 x 3.9 cm image 36 previously 6.3 x 5.7 cm. These are increased in size and number since 11/20/2016. Scattered nonobstructing renal calculi. Tiny calculus within urinary bladder dependently. No hydronephrosis or hydroureter. No ureteral calcifications. Bladder otherwise unremarkable. Stomach/Bowel: Colonic diverticulosis primarily at sigmoid colon. No CT evidence of diverticulitis. Normal appendix. Stomach and bowel loops otherwise normal appearance. Vascular/Lymphatic: Atherosclerotic calcifications aorta and iliac arteries without aneurysm. Scattered pelvic phleboliths. No adenopathy. Reproductive: Uterus surgically absent.  Normal sized ovaries. Other: No free air or free fluid. No hernia. Linear high attenuation focus in the anterior mid abdomen supraumbilical appears unchanged question sequela of patient's reported prior ventral hernia repair. No inflammatory process. Musculoskeletal: Osseous demineralization. Probable vertebral hemangioma L4 unchanged. IMPRESSION: Colonic diverticulosis without evidence of diverticulitis. Multiple BILATERAL nonobstructing renal calculi. Tiny calculus within urinary bladder dependently. Numerous BILATERAL renal cysts low to intermediate  attenuation, increased in size and number since 11/20/2016; follow-up characterization by non emergent renal protocol MRI or CT with and without contrast recommended to assess indeterminate lesions. Post cholecystectomy with mild intrahepatic  and extrahepatic biliary dilatation, slightly increased from previous exam, recommend correlation with LFTs. Stable LEFT adrenal adenoma. No acute intra-abdominal or intrapelvic abnormalities. Aortic Atherosclerosis (ICD10-I70.0). Electronically Signed   By: Lavonia Dana M.D.   On: 07/26/2022 20:10    Microbiology: Results for orders placed or performed during the hospital encounter of 07/26/22  Urine Culture     Status: Abnormal   Collection Time: 07/26/22 11:58 PM   Specimen: Urine, Clean Catch  Result Value Ref Range Status   Specimen Description   Final    URINE, CLEAN CATCH Performed at [facility], [address] [place], [place] [address]    Special Requests   Final    NONE Performed at [facility], [address] [place], [place] [address]    Culture MULTIPLE SPECIES PRESENT, SUGGEST RECOLLECTION (A)  Final   Report Status 07/28/2022 FINAL  Final    Labs: CBC: Recent Labs  Lab 07/31/22 1753 08/02/22 1636 08/03/22 0932 08/04/22 0438 08/05/22 0426 08/06/22 0505  WBC 6.7 7.0 6.8 6.6 4.8 5.0  NEUTROABS 4.7 4.9  --   --  2.6 3.1  HGB 13.6 13.2 12.5 10.6* 11.2* 10.8*  HCT 42.0 41.1 39.6 33.3* 35.8* 35.2*  MCV 96.8 97.4 99.0 98.8 100.0 100.6*  PLT 243 267 235 218 220 262   Basic Metabolic Panel: Recent Labs  Lab 07/31/22 1753 08/02/22 1636 08/03/22 0932 08/04/22 0438 08/05/22 0426 08/06/22 0505  NA 141 141  --  137 140 138  K 4.1 4.4  --  3.2* 4.1 4.0  CL 105 102  --  105 106 106  CO2 26 24  --  '29 27 25  '$ GLUCOSE 187* 179*  --  167* 173* 246*  BUN 13 15  --  '15 12 14  '$ CREATININE 0.92 1.18* 1.13* 1.29* 1.09* 0.97  CALCIUM 9.4 9.5  --  8.6* 8.5* 8.3*  MG  --   --   --  1.8  --   --   PHOS  --   --   --  3.9  --   --     Liver Function Tests: Recent Labs  Lab 07/31/22 1753 08/02/22 1636 08/04/22 0438  AST 36 51* 19  ALT 19 33 17  ALKPHOS 61 62 47  BILITOT 0.5 1.0 0.3  PROT 6.5 6.7 5.1*  ALBUMIN 3.5 3.7 2.8*   CBG: Recent Labs  Lab 08/05/22 1659 08/05/22 2057 08/06/22 0741 08/06/22 1134 08/06/22 1632  GLUCAP 239* 247* 217* 207* 180*    Discharge time spent: greater than 30 minutes.  Signed: Alma Friendly, MD [facility] 08/06/2022

## 2022-08-08 ENCOUNTER — Other Ambulatory Visit: Payer: Self-pay | Admitting: Physical Medicine and Rehabilitation

## 2022-08-09 ENCOUNTER — Telehealth: Payer: Self-pay

## 2022-08-09 NOTE — Telephone Encounter (Signed)
Transition Care Management Unsuccessful Follow-up Telephone Call  Date of discharge and from where:  [facility] 08/07/20[facility]  Attempts:  1st Attempt  Reason for unsuccessful TCM follow-up call:  Left voice message Juanda Crumble, [place] Direct Dial [phone]

## 2022-08-10 ENCOUNTER — Emergency Department (HOSPITAL_COMMUNITY)
Admission: EM | Admit: 2022-08-10 | Discharge: 2022-08-11 | Disposition: A | Discharge status: Home / Self Care | Payer: 59 | Attending: Emergency Medicine | Admitting: Emergency Medicine

## 2022-08-10 ENCOUNTER — Emergency Department (HOSPITAL_COMMUNITY): Payer: 59

## 2022-08-10 ENCOUNTER — Encounter (HOSPITAL_COMMUNITY): Payer: Self-pay

## 2022-08-10 ENCOUNTER — Other Ambulatory Visit: Payer: Self-pay

## 2022-08-10 DIAGNOSIS — R638 Other symptoms and signs concerning food and fluid intake: Secondary | ICD-10-CM | POA: Insufficient documentation

## 2022-08-10 DIAGNOSIS — E1122 Type 2 diabetes mellitus with diabetic chronic kidney disease: Secondary | ICD-10-CM | POA: Diagnosis not present

## 2022-08-10 DIAGNOSIS — I129 Hypertensive chronic kidney disease with stage 1 through stage 4 chronic kidney disease, or unspecified chronic kidney disease: Secondary | ICD-10-CM | POA: Diagnosis not present

## 2022-08-10 DIAGNOSIS — R5383 Other fatigue: Secondary | ICD-10-CM | POA: Insufficient documentation

## 2022-08-10 DIAGNOSIS — R21 Rash and other nonspecific skin eruption: Secondary | ICD-10-CM | POA: Insufficient documentation

## 2022-08-10 DIAGNOSIS — M79606 Pain in leg, unspecified: Secondary | ICD-10-CM | POA: Diagnosis not present

## 2022-08-10 DIAGNOSIS — R1084 Generalized abdominal pain: Secondary | ICD-10-CM | POA: Diagnosis not present

## 2022-08-10 DIAGNOSIS — Z794 Long term (current) use of insulin: Secondary | ICD-10-CM | POA: Insufficient documentation

## 2022-08-10 DIAGNOSIS — Z79899 Other long term (current) drug therapy: Secondary | ICD-10-CM | POA: Insufficient documentation

## 2022-08-10 DIAGNOSIS — R110 Nausea: Secondary | ICD-10-CM | POA: Insufficient documentation

## 2022-08-10 DIAGNOSIS — N189 Chronic kidney disease, unspecified: Secondary | ICD-10-CM | POA: Insufficient documentation

## 2022-08-10 LAB — COMPREHENSIVE METABOLIC PANEL
ALT: 33 U/L (ref 0–44)
AST: 128 U/L — ABNORMAL HIGH (ref 15–41)
Albumin: 3.4 g/dL — ABNORMAL LOW (ref 3.5–5.0)
Alkaline Phosphatase: 112 U/L (ref 38–126)
Anion gap: 9 (ref 5–15)
BUN: 13 mg/dL (ref 8–23)
CO2: 29 mmol/L (ref 22–32)
Calcium: 9.7 mg/dL (ref 8.9–10.3)
Chloride: 103 mmol/L (ref 98–111)
Creatinine, Ser: 1.2 mg/dL — ABNORMAL HIGH (ref 0.44–1.00)
GFR, Estimated: 47 mL/min — ABNORMAL LOW (ref >60)
Glucose, Bld: 149 mg/dL — ABNORMAL HIGH (ref 70–99)
Potassium: 4.4 mmol/L (ref 3.5–5.1)
Sodium: 141 mmol/L (ref 135–145)
Total Bilirubin: 0.5 mg/dL (ref 0.3–1.2)
Total Protein: 6.2 g/dL — ABNORMAL LOW (ref 6.5–8.1)

## 2022-08-10 LAB — CBC WITH DIFFERENTIAL/PLATELET
Abs Immature Granulocytes: 0.02 10*3/uL (ref 0.00–0.07)
Basophils Absolute: 0 10*3/uL (ref 0.0–0.1)
Basophils Relative: 1 %
Eosinophils Absolute: 0 10*3/uL (ref 0.0–0.5)
Eosinophils Relative: 0 %
HCT: 41.7 % (ref 36.0–46.0)
Hemoglobin: 13.2 g/dL (ref 12.0–15.0)
Immature Granulocytes: 0 %
Lymphocytes Relative: 25 %
Lymphs Abs: 1.7 10*3/uL (ref 0.7–4.0)
MCH: 30.8 pg (ref 26.0–34.0)
MCHC: 31.7 g/dL (ref 30.0–36.0)
MCV: 97.2 fL (ref 80.0–100.0)
Monocytes Absolute: 0.6 10*3/uL (ref 0.1–1.0)
Monocytes Relative: 9 %
Neutro Abs: 4.4 10*3/uL (ref 1.7–7.7)
Neutrophils Relative %: 65 %
Platelets: 273 10*3/uL (ref 150–400)
RBC: 4.29 MIL/uL (ref 3.87–5.11)
RDW: 13.2 % (ref 11.5–15.5)
WBC: 6.7 10*3/uL (ref 4.0–10.5)
nRBC: 0 % (ref 0.0–0.2)

## 2022-08-10 LAB — BRAIN NATRIURETIC PEPTIDE: B Natriuretic Peptide: 60.8 pg/mL (ref 0.0–100.0)

## 2022-08-10 LAB — TROPONIN I (HIGH SENSITIVITY): Troponin I (High Sensitivity): 15 ng/L (ref <18)

## 2022-08-10 LAB — CK: Total CK: 43 U/L (ref 38–234)

## 2022-08-10 LAB — MAGNESIUM: Magnesium: 2.1 mg/dL (ref 1.7–2.4)

## 2022-08-10 LAB — T4, FREE: Free T4: 0.94 ng/dL (ref 0.61–1.12)

## 2022-08-10 LAB — LIPASE, BLOOD: Lipase: 30 U/L (ref 11–51)

## 2022-08-10 LAB — TSH: TSH: 26.516 u[IU]/mL — ABNORMAL HIGH (ref 0.350–4.500)

## 2022-08-10 MED ORDER — FENTANYL CITRATE PF 50 MCG/ML IJ SOSY
50.0000 ug | PREFILLED_SYRINGE | Freq: Once | INTRAMUSCULAR | Status: AC
  Administered 2022-08-10: 50 ug via INTRAVENOUS
  Filled 2022-08-10: qty 1

## 2022-08-10 MED ORDER — MAGNESIUM SULFATE 2 GM/50ML IV SOLN: 2.0000 g | Freq: Once | INTRAVENOUS | Status: DC

## 2022-08-10 MED ORDER — HYDROMORPHONE HCL 1 MG/ML IJ SOLN
0.5000 mg | Freq: Once | INTRAMUSCULAR | Status: AC
  Administered 2022-08-10: 0.5 mg via INTRAVENOUS
  Filled 2022-08-10: qty 1

## 2022-08-10 MED ORDER — HALOPERIDOL LACTATE 5 MG/ML IJ SOLN
3.0000 mg | Freq: Once | INTRAMUSCULAR | Status: AC
  Administered 2022-08-10: 3 mg via INTRAVENOUS
  Filled 2022-08-10: qty 1

## 2022-08-10 MED ORDER — LACTATED RINGERS IV BOLUS
1500.0000 mL | Freq: Once | INTRAVENOUS | Status: AC
  Administered 2022-08-10: 1500 mL via INTRAVENOUS

## 2022-08-10 MED ORDER — METOCLOPRAMIDE HCL 5 MG/ML IJ SOLN
10.0000 mg | Freq: Once | INTRAMUSCULAR | Status: AC
  Administered 2022-08-10: 10 mg via INTRAVENOUS
  Filled 2022-08-10: qty 2

## 2022-08-10 MED ORDER — IOHEXOL 350 MG/ML SOLN
75.0000 mL | Freq: Once | INTRAVENOUS | Status: AC | PRN
  Administered 2022-08-10: 75 mL via INTRAVENOUS

## 2022-08-10 MED ORDER — ALUM & MAG HYDROXIDE-SIMETH 200-200-20 MG/5ML PO SUSP
30.0000 mL | Freq: Once | ORAL | Status: AC
  Administered 2022-08-10: 30 mL via ORAL
  Filled 2022-08-10: qty 30

## 2022-08-10 MED ORDER — KETOROLAC TROMETHAMINE 15 MG/ML IJ SOLN
15.0000 mg | Freq: Once | INTRAMUSCULAR | Status: AC
  Administered 2022-08-10: 15 mg via INTRAVENOUS
  Filled 2022-08-10: qty 1

## 2022-08-10 MED ORDER — DICYCLOMINE HCL 10 MG/5ML PO SOLN: 10.0000 mg | Freq: Once | ORAL | Status: DC

## 2022-08-10 MED ORDER — NYSTATIN 100000 UNIT/GM EX POWD
Freq: Once | CUTANEOUS | Status: AC
  Filled 2022-08-10: qty 15

## 2022-08-10 MED ORDER — DICYCLOMINE HCL 10 MG PO CAPS
10.0000 mg | ORAL_CAPSULE | Freq: Once | ORAL | Status: AC
  Administered 2022-08-10: 10 mg via ORAL
  Filled 2022-08-10: qty 1

## 2022-08-10 NOTE — ED Triage Notes (Signed)
Pt BIB [facility] from home c/o abdominal pain and pain around her belly button and bilateral leg pain. Pt was released by [facility] last week for same without figuring out what was going on.

## 2022-08-10 NOTE — ED Provider Notes (Incomplete)
having bilateral leg pain. Feels like having weeks of constipation, feels like needs to have a BM and then is not able to do so.  Abd pain umbilicus, does not move. Sharp pain, nothing makes it better or worse.  2 years of bilateral LE weakness, bedbound.  Light touch very sensitive to pain. Has been out of lyrica for about one month but back on it in hospital. Does acknowledge heart raciing, at times. Family doesn't throw up, just a little bit comes up.  BM yesterday.  Past Medical History:  Diagnosis Date   At risk for adverse drug event 01/22/2019     RIOSORD (Risk Index for Overdose or Serious Opioid -induced Respiratory Depression Risk ) is a tool which determines that risk over the  ensuing 6 months . Based on point total for personal medical co-morbidities and present drug therapies ; the risk : benefit ratio of any drug regimen can be defined and informed consent documented. Reference: [facility] October 2017    Bilateral lower extremity edema    right > left   Bipolar 1 disorder ([place])    CKD (chronic kidney disease) stage 3, GFR 30-59 ml/min (HCC)     Dr Lawson Radar, [facility]   Constipation 06/06/2020   Diabetes, polyneuropathy ([place])    FEET AND FINGER TIPS   Fibromyalgia    GERD (gastroesophageal reflux disease)    History of chronic gastritis    History of COVID-24 Aug 2019   Hyperlipidemia    Hypertension    per pt was take off bp medication because of kidney disease told by her nephrologist   Hypothyroidism    Intractable nausea and vomiting 01/02/2020   Mixed incontinence urge and stress    OA (osteoarthritis)    KNEES   OAB (overactive bladder)    PONV (postoperative nausea and vomiting)    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   Type 2 diabetes mellitus treated with insulin ([place])    Wears glasses     Past Surgical History:  Procedure Laterality Date   CHOLECYSTECTOMY OPEN  1990's   COLONOSCOPY  last one 07-17-2014    CYSTOSCOPY WITH INJECTION N/A 10/29/2016   Procedure: CYSTOSCOPY WITH INJECTION BOTOX 100 UNITS, urethral dilation;  Surgeon: Carolan Clines, MD;  Location: [facility];  Service: Urology;  Laterality: N/A;   ESOPHAGOGASTRODUODENOSCOPY  last one 07-01-2016   EXCISIONAL BREAST BX  1990   BENIGN   HARDWARE REMOVAL Right 03/05/2020   Procedure: HARDWARE REMOVAL RIGHT ANKLE;  Surgeon: Leandrew Koyanagi, MD;  Location: [facility];  Service: Orthopedics;  Laterality: Right;   ORIF TIBIA & FIBULA FRACTURES  2010   retained rod   TONSILLECTOMY  age 12   TOTAL KNEE ARTHROPLASTY Left 01/15/2019   Procedure: LEFT TOTAL KNEE ARTHROPLASTY;  Surgeon: Leandrew Koyanagi, MD;  Location: [facility];  Service: Orthopedics;  Laterality: Left;   VAGINAL HYSTERECTOMY  1979   VENTRAL HERNIA REPAIR  1990's    Physical Exam  BP (!) 132/55   Pulse 69   Temp 97.7 F (36.5 C) (Oral)   Resp 18   Ht '5\' 9"'$  (1.753 m)   Wt 72.6 kg   SpO2 97%   BMI 23.63 kg/m   Physical Exam  Procedures  Procedures  ED Course / MDM    Medical Decision Making Amount and/or Complexity of Data Reviewed Labs: ordered. Radiology: ordered.  Risk OTC  drugs. Prescription drug management.   Received care of patient from Dr. Doren Custard. Please see his note for prior history and care.  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 She unfortunately has been living in pain related to this over the alst 2 years with no clear etiology/diagnosis-had seen PM/R Dr who in note has reviewed MRI T/Lspine,  and discussed with [facility]. Sister reports she has been living with both leg pain and abdominal pain for years.  She was recently admitted to [facility] 1/22 with concern for nausea, vomiting, diarrhea, decreased po intake, possible UTI although culture showed contaminent, free T4 elevated  and synthroid decreased, then admitted again for dehydration 1/29.   She had run out of her lyrica prior to these admissions and discussed the possibility that lyrica withdrawal may have contributed to this but sister is insistent that she had these symptoms before that and have been long standing.  She does not want to live in rehab facility and sister provides her with care.   Personally reviewed labs and CT ordered by Dr. Doren Custard.  Labs show Cr similar to recent prior, no pancreatitis, no leukocytosis, no anemia. CT with no acute process.    Given her concern for ongoing leg pain on evaluation did consider other etiologies. Has pulses bilateral LE and no sign of acute arterial occlusion, doubt dissection given chronicity and recent  negative CTA in last few weeks.  No asymmetric swelling and doubt acute DVT given this and chronicity of pain.  In addition do not suspect necrotizing fasciitis, septic arthritis, cellulitis. I did personally consider obtain MRI cervical spine to evaluate for underlying cause as previous work up was brain, t and l spine, but given symptoms began in 2021 an MRI CSpine today would not change her emergency course of care. Ordered CK which returned and was normal, no sign of rhabdomyolysis. She noted some papitations and troponin negative doubt ACS, ECG without acute abnormalities, BNP and chest XR also without acute findings.  UA was not reordered by Dr. Doren Custard however she had recent negative culture and not having new symptoms on my evaluation and will defer given low clinical concern.     TSH was evalauted and high with normal T4 in the setting of recent decrease in synthroid dosing with hospital discharge 1/25 and discussed with IM residents whether we should adjust dosing and at this time.  For now, will keep dosing the same and defer to her primary physician given normal T4 and plan for repeat evaluation. Her vitals, exam hx not consistent with myxedema coma.  Expressed  sympathy for her current situation but I am not sure another hospitalization for these symptoms will benefit her as I do not see an emergent etiology of abdominal or leg pain. Her vital signs are stable.  I asked if she had seen Neurology, gastroenterology or physical medicine and rehab and they stated no howeever chart review does show Neurology consultation and physical medicine and rehab visits.  Recommend continued follow up with them and PCP and consider pain management.       Gareth Morgan, MD 08/11/22 1334

## 2022-08-10 NOTE — ED Notes (Signed)
Patient transported to CT 

## 2022-08-10 NOTE — ED Notes (Signed)
[facility] called unable to give pick up time 

## 2022-08-10 NOTE — ED Provider Notes (Signed)
[facility] Provider Note   CSN: [id number] Arrival date & time: 08/10/22  1058     History  Chief Complaint  Patient presents with   Abdominal Pain   Leg Pain    Toni Hill is a 77 y.o. female.   Abdominal Pain Associated symptoms: fatigue and nausea   Leg Pain Associated symptoms: fatigue   Patient presents for abdominal pain, nausea, and poor p.o. intake.  Medical history includes T2DM, HTN, HLD, CKD, arthritis, anxiety, bipolar disorder, prior DVT, GERD.  Symptoms have been ongoing for the past several weeks.  She has been admitted to [facility] twice in the past 2 weeks.  During her last admission, she was treated for UTI and transition to fosfomycin at time of discharge.  She was placed on bowel regimen and was having loose stools while hospitalized.  Since her discharge, she has continued to have normal bowel movements, including 2 yesterday.  Her to review, she was eating very well while hospitalized.  Patient and her sister/caregiver state that she has been able to tolerate only small sips of fluids since she was discharged.  She has had ongoing weight loss.  She states that she has a generalized abdominal pain that she feels is different than her abdominal pain over the past 2 weeks.  She is bedbound at baseline secondary to lower extremity paralysis.  Her sister stays with her full-time.  Blood sugars at home have been in the range of 200s.     Home Medications Prior to Admission medications   Medication Sig Start Date End Date Taking? Authorizing Provider  Accu-Chek Softclix Lancets lancets Use to check blood sugar up to 4 times per day. 03/05/22   Farrel Gordon, DO  acetaminophen (TYLENOL) 500 MG tablet Take 1,000 mg by mouth in the morning and at bedtime.    [provider]  Blood Glucose Monitoring Suppl (ACCU-CHEK GUIDE) w/Device KIT Check blood sugar 1 times per day. 02/10/22   Farrel Gordon, DO  Calcium  Carb-Cholecalciferol (CALCIUM 600/VITAMIN D3) 600-800 MG-UNIT TABS Take 1 tablet by mouth daily.     [provider]  cyanocobalamin 1000 MCG tablet Take 1 tablet (1,000 mcg total) by mouth daily. 08/07/22 09/06/22  Alma Friendly, MD  diclofenac Sodium (VOLTAREN) 1 % GEL APPLY 1 APPLICATION TOPICALLY 4 TIMES A DAY Patient taking differently: Apply 2 g topically 4 (four) times daily. 05/10/22   Farrel Gordon, DO  ezetimibe (ZETIA) 10 MG tablet TAKE 1 TABLET BY MOUTH EVERY DAY IN THE EVENING Patient taking differently: Take 10 mg by mouth daily. 07/14/22   Farrel Gordon, DO  glucose blood (ACCU-CHEK GUIDE) test strip Check blood sugar up to 4 times per day 03/05/22   Farrel Gordon, DO  insulin aspart (NOVOLOG) 100 UNIT/ML injection Inject 6 Units into the skin daily with breakfast AND 10 Units daily with lunch AND 10 Units daily with supper. Patient not taking: Reported on 08/03/2022 02/10/22   Farrel Gordon, DO  insulin degludec (TRESIBA FLEXTOUCH) 100 UNIT/ML FlexTouch Pen Inject 12 Units into the skin daily. Patient not taking: Reported on 08/03/2022 02/10/22   Farrel Gordon, DO  Insulin Pen Needle (B-D ULTRAFINE III SHORT PEN) 31G X 8 MM MISC 1 each by Does not apply route daily. Diagnosis code: E11.9. Used to inject insulin daily. 12/03/21   Maudie Mercury, MD  levothyroxine (SYNTHROID) 100 MCG tablet Take 1 tablet (100 mcg total) by mouth daily before breakfast. 07/29/22  10/27/22  Dessa Phi, DO  OneTouch Delica Lancets 76H MISC Check blood sugar 3 times a day 03/14/20   Maudie Mercury, MD  pantoprazole (PROTONIX) 40 MG tablet TAKE 1 TABLET BY MOUTH EVERY DAY Patient taking differently: Take 40 mg by mouth daily. 07/14/22   Farrel Gordon, DO  pregabalin (LYRICA) 150 MG capsule TAKE 1 CAPSULE BY MOUTH TWO TIMES A DAY Patient taking differently: Take 150 mg by mouth 2 (two) times daily. 03/10/22   Farrel Gordon, DO  senna-docusate (SENOKOT-S) 8.6-50 MG tablet Take 1 tablet by mouth 2 (two) times daily for 5  days. 08/06/22 08/11/22  Alma Friendly, MD  sertraline (ZOLOFT) 100 MG tablet TAKE 2 TABLETS BY MOUTH EVERY DAY Patient taking differently: Take 200 mg by mouth daily. 05/04/22   Farrel Gordon, DO  simvastatin (ZOCOR) 40 MG tablet TAKE 1 TABLET BY MOUTH EVERY DAY 07/26/22   Farrel Gordon, DO  tiZANidine (ZANAFLEX) 4 MG tablet Take 1 tablet (4 mg total) by mouth at bedtime. Patient taking differently: Take 4 mg by mouth See admin instructions. Take 4 mg in the morning and 4 mg at bedtime 07/29/22 10/27/22  Dessa Phi, DO  VITAMIN E PO Take 400 Units by mouth 2 (two) times daily.    [provider]      Allergies    Bactrim [sulfamethoxazole-trimethoprim], Cephalexin, Gabapentin, Morphine and related, Vioxx [rofecoxib], and Penicillins    Review of Systems   Review of Systems  Constitutional:  Positive for appetite change, fatigue and unexpected weight change.  Gastrointestinal:  Positive for abdominal pain and nausea.    Physical Exam Updated Vital Signs BP (!) 132/55   Pulse 69   Temp 97.7 F (36.5 C) (Oral)   Resp 18   Ht '5\' 9"'$  (1.753 m)   Wt 72.6 kg   SpO2 97%   BMI 23.63 kg/m  Physical Exam Exam conducted with a chaperone present.  Constitutional:      General: She is not in acute distress.    Appearance: She is ill-appearing. She is not toxic-appearing or diaphoretic.  HENT:     Head: Normocephalic and atraumatic.     Mouth/Throat:     Mouth: Mucous membranes are moist.  Eyes:     General: No scleral icterus.    Extraocular Movements: Extraocular movements intact.  Cardiovascular:     Rate and Rhythm: Normal rate and regular rhythm.  Pulmonary:     Effort: Tachypnea present.     Breath sounds: Normal breath sounds. No decreased air movement or transmitted upper airway sounds.  Abdominal:     Palpations: Abdomen is soft.     Tenderness: There is generalized abdominal tenderness. There is no guarding or rebound.  Genitourinary:    Comments: No appreciable  anal fissures.  Mild intertriginous rash in gluteal cleft. Skin:    General: Skin is warm and dry.  Neurological:     General: No focal deficit present.     Mental Status: She is alert and oriented to person, place, and time.     Comments: Baseline rigidity and paralysis of RLE  Psychiatric:        Mood and Affect: Mood normal.        Behavior: Behavior normal.     ED Results / Procedures / Treatments   Labs (all labs ordered are listed, but only abnormal results are displayed) Labs Reviewed  CBC WITH DIFFERENTIAL/PLATELET  URINALYSIS, ROUTINE W REFLEX MICROSCOPIC  COMPREHENSIVE METABOLIC PANEL  LIPASE, BLOOD  MAGNESIUM    EKG EKG Interpretation  Date/Time:  Tuesday August 10 2022 14:21:58 EST Ventricular Rate:  74 PR Interval:  168 QRS Duration: 146 QT Interval:  433 QTC Calculation: 481 R Axis:   96 Text Interpretation: Sinus rhythm RBBB and LPFB Confirmed by Godfrey Pick [phone]) on 08/10/2022 2:33:56 PM  Radiology No results found.  Procedures Procedures    Medications Ordered in ED Medications  HYDROmorphone (DILAUDID) injection 0.5 mg (0.5 mg Intravenous Given 08/10/22 1312)  metoCLOPramide (REGLAN) injection 10 mg (10 mg Intravenous Given 08/10/22 1312)  alum & mag hydroxide-simeth (MAALOX/MYLANTA) 200-200-20 MG/5ML suspension 30 mL (30 mLs Oral Given 08/10/22 1311)  lactated ringers bolus 1,500 mL (0 mLs Intravenous Stopped 08/10/22 1442)  dicyclomine (BENTYL) capsule 10 mg (10 mg Oral Given 08/10/22 1312)  nystatin (MYCOSTATIN/NYSTOP) topical powder ( Topical Given 08/10/22 1508)  ketorolac (TORADOL) 15 MG/ML injection 15 mg (15 mg Intravenous Given 08/10/22 1442)    ED Course/ Medical Decision Making/ A&P                             Medical Decision Making Amount and/or Complexity of Data Reviewed Labs: ordered. Radiology: ordered.  Risk OTC drugs. Prescription drug management.   This patient presents to the ED for concern of abdominal pain, this involves  an extensive number of treatment options, and is a complaint that carries with it a high risk of complications and morbidity.  The differential diagnosis includes functional dyspepsia, enteritis, colitis, SBO, neoplasm   Co morbidities that complicate the patient evaluation  T2DM, HTN, HLD, CKD, arthritis, anxiety, bipolar disorder, prior DVT, GERD   Additional history obtained:  Additional history obtained from patient's sister External records from outside source obtained and reviewed including EMR   Lab Tests:  I Ordered, and personally interpreted labs.  The pertinent results include: CBC shows normal hemoglobin and no leukocytosis.  Remaining lab work is pending at time of signout.   Imaging Studies ordered:  I ordered imaging studies including CT of abdomen pelvis I independently visualized and interpreted imaging which showed (pending at time of signout) I agree with the radiologist interpretation   Cardiac Monitoring: / EKG:  The patient was maintained on a cardiac monitor.  I personally viewed and interpreted the cardiac monitored which showed an underlying rhythm of: Sinus rhythm   Problem List / ED Course / Critical interventions / Medication management  Patient presents from home via EMS for abdominal pain.  She has had ongoing abdominal pain for the past several weeks.  Over that timeframe, she has been admitted to [facility] twice.  Patient and sister report that she has had very limited p.o. intake since her recent hospital discharge.  Patient also states that she was not eating while she was in the hospital either.  Per chart review, she was eating well.  Patient had prior constipation which was treated during her more recent hospitalization.  She states that she has continued to have bowel movements at home.  On exam, she does have generalized abdominal tenderness.  Although she has had ongoing abdominal pain, she feels like the symptoms have changed recently.  Lab  work and CT imaging were ordered.  Patient endorses current nausea.  IV fluids, Reglan, Dilaudid, Bentyl, and GI cocktail were ordered for analgesia.  IV fluids were ordered for hydration.  On reassessment, patient endorsed ongoing pain.  Toradol was ordered.  She also states that she has had  pain around her rectum.  Inspection of this area was performed with nurse chaperone present.  She does have an intertriginous rash in her gluteal cleft.  Nystatin powder was ordered.  She does not appear to have any anal fissures or any areas of swelling or focal tenderness.  At time of signout, lab work and CT imaging studies were pending.  Care of patient was signed out to oncoming ED provider. I ordered medication including IV fluids for hydration; Dilaudid, Bentyl, Toradol, and Maalox for abdominal pain; Reglan for nausea. Reevaluation of the patient after these medicines showed that the patient improved I have reviewed the patients home medicines and have made adjustments as needed   Social Determinants of Health:  Lives at home with sister.  Nonambulatory at baseline.        Final Clinical Impression(s) / ED Diagnoses Final diagnoses:  Generalized abdominal pain  Nausea    Rx / DC Orders ED Discharge Orders     None         Godfrey Pick, MD 08/10/22 [phone]

## 2022-08-10 NOTE — ED Provider Notes (Incomplete)
  Physical Exam  BP (!) 132/55   Pulse 69   Temp 97.7 F (36.5 C) (Oral)   Resp 18   Ht '5\' 9"'$  (1.753 m)   Wt 72.6 kg   SpO2 97%   BMI 23.63 kg/m   Physical Exam  Procedures  Procedures  ED Course / MDM    Medical Decision Making Amount and/or Complexity of Data Reviewed Labs: ordered. Radiology: ordered.  Risk OTC drugs. Prescription drug management.   ***

## 2022-08-10 NOTE — Telephone Encounter (Signed)
Transition Care Management Unsuccessful Follow-up Telephone Call  Date of discharge and from where:  [facility] 08/07/2022  Attempts:  2nd Attempt  Reason for unsuccessful TCM follow-up call:  Left voice message Juanda Crumble, [place] Direct Dial [phone]

## 2022-08-11 ENCOUNTER — Other Ambulatory Visit: Payer: Self-pay | Admitting: Internal Medicine

## 2022-08-11 LAB — RPR: RPR Ser Ql: NONREACTIVE

## 2022-08-11 NOTE — Telephone Encounter (Signed)
Transition Care Management Unsuccessful Follow-up Telephone Call  Date of discharge and from where:  [facility] 08/07/2022  Attempts:  3rd Attempt  Reason for unsuccessful TCM follow-up call:  Left voice message Juanda Crumble, [place] Direct Dial [phone]

## 2022-08-12 ENCOUNTER — Encounter: Payer: Self-pay | Admitting: Internal Medicine

## 2022-08-12 ENCOUNTER — Telehealth: Payer: Self-pay

## 2022-08-12 DIAGNOSIS — L8990 Pressure ulcer of unspecified site, unspecified stage: Secondary | ICD-10-CM

## 2022-08-12 NOTE — Telephone Encounter (Signed)
DME order placed for air mattress

## 2022-08-12 NOTE — Telephone Encounter (Signed)
Call Johnny to inform him DME order has been placed.

## 2022-08-12 NOTE — Telephone Encounter (Signed)
Toni Hill from [facility] [employer] called he went to see patient today and he want to know if the patient can get a air mattress to reduce her bed sores. [name] is requesting a call back @ [phone]

## 2022-08-13 LAB — T3, FREE: T3, Free: 1.4 pg/mL — ABNORMAL LOW (ref 2.0–4.4)

## 2022-08-15 ENCOUNTER — Other Ambulatory Visit: Payer: Self-pay

## 2022-08-15 ENCOUNTER — Emergency Department (HOSPITAL_COMMUNITY)
Admission: EM | Admit: 2022-08-15 | Discharge: 2022-08-16 | Disposition: A | Discharge status: Home / Self Care | Payer: 59 | Attending: Emergency Medicine | Admitting: Emergency Medicine

## 2022-08-15 ENCOUNTER — Encounter (HOSPITAL_COMMUNITY): Payer: Self-pay

## 2022-08-15 DIAGNOSIS — R197 Diarrhea, unspecified: Secondary | ICD-10-CM | POA: Diagnosis not present

## 2022-08-15 DIAGNOSIS — R1033 Periumbilical pain: Secondary | ICD-10-CM

## 2022-08-15 LAB — COMPREHENSIVE METABOLIC PANEL
ALT: 13 U/L (ref 0–44)
AST: 20 U/L (ref 15–41)
Albumin: 3.9 g/dL (ref 3.5–5.0)
Alkaline Phosphatase: 89 U/L (ref 38–126)
Anion gap: 13 (ref 5–15)
BUN: 21 mg/dL (ref 8–23)
CO2: 26 mmol/L (ref 22–32)
Calcium: 9.2 mg/dL (ref 8.9–10.3)
Chloride: 102 mmol/L (ref 98–111)
Creatinine, Ser: 1.22 mg/dL — ABNORMAL HIGH (ref 0.44–1.00)
GFR, Estimated: 46 mL/min — ABNORMAL LOW (ref >60)
Glucose, Bld: 99 mg/dL (ref 70–99)
Potassium: 3.6 mmol/L (ref 3.5–5.1)
Sodium: 141 mmol/L (ref 135–145)
Total Bilirubin: 0.7 mg/dL (ref 0.3–1.2)
Total Protein: 6.7 g/dL (ref 6.5–8.1)

## 2022-08-15 LAB — URINALYSIS, ROUTINE W REFLEX MICROSCOPIC
Bilirubin Urine: NEGATIVE
Glucose, UA: NEGATIVE mg/dL
Hgb urine dipstick: NEGATIVE
Ketones, ur: 20 mg/dL — AB
Nitrite: NEGATIVE
Protein, ur: 30 mg/dL — AB
Specific Gravity, Urine: 1.027 (ref 1.005–1.030)
pH: 5 (ref 5.0–8.0)

## 2022-08-15 LAB — CBC WITH DIFFERENTIAL/PLATELET
Abs Immature Granulocytes: 0.01 10*3/uL (ref 0.00–0.07)
Basophils Absolute: 0.1 10*3/uL (ref 0.0–0.1)
Basophils Relative: 1 %
Eosinophils Absolute: 0.1 10*3/uL (ref 0.0–0.5)
Eosinophils Relative: 1 %
HCT: 44.2 % (ref 36.0–46.0)
Hemoglobin: 14.3 g/dL (ref 12.0–15.0)
Immature Granulocytes: 0 %
Lymphocytes Relative: 26 %
Lymphs Abs: 1.6 10*3/uL (ref 0.7–4.0)
MCH: 31.5 pg (ref 26.0–34.0)
MCHC: 32.4 g/dL (ref 30.0–36.0)
MCV: 97.4 fL (ref 80.0–100.0)
Monocytes Absolute: 0.6 10*3/uL (ref 0.1–1.0)
Monocytes Relative: 9 %
Neutro Abs: 3.9 10*3/uL (ref 1.7–7.7)
Neutrophils Relative %: 63 %
Platelets: 282 10*3/uL (ref 150–400)
RBC: 4.54 MIL/uL (ref 3.87–5.11)
RDW: 13.6 % (ref 11.5–15.5)
WBC: 6.2 10*3/uL (ref 4.0–10.5)
nRBC: 0 % (ref 0.0–0.2)

## 2022-08-15 LAB — LIPASE, BLOOD: Lipase: 27 U/L (ref 11–51)

## 2022-08-15 MED ORDER — GERHARDT'S BUTT CREAM
TOPICAL_CREAM | Freq: Once | CUTANEOUS | Status: AC
  Filled 2022-08-15: qty 1

## 2022-08-15 MED ORDER — HYOSCYAMINE SULFATE SL 0.125 MG SL SUBL: 1.0000 | SUBLINGUAL_TABLET | Freq: Two times a day (BID) | SUBLINGUAL | 0 refills | Status: AC | PRN

## 2022-08-15 MED ORDER — HYOSCYAMINE SULFATE 0.125 MG PO TBDP
0.1250 mg | ORAL_TABLET | Freq: Once | ORAL | Status: AC
  Administered 2022-08-15: 0.125 mg via ORAL
  Filled 2022-08-15: qty 1

## 2022-08-15 NOTE — ED Notes (Signed)
[facility] called state they do not have an ETA of when this patient will be transported home.

## 2022-08-15 NOTE — ED Notes (Signed)
Pt expressed frustrations about remaining in the ED until EMS was able to take her home, RN explained that an estimated time for their arrival could not be given. Pt requested to speak with the ED provider, Dr. Gilford Raid went to speak with pt. Dr. Gilford Raid offered to order pts home meds, pt refused.

## 2022-08-15 NOTE — ED Provider Notes (Signed)
[facility] Provider Note   CSN: [id number] Arrival date & time: 08/15/22  1156     History  Chief Complaint  Patient presents with   Abdominal Pain    Toni Hill is a 77 y.o. female presenting with a history of persistent abdominal pain in the periumbilical region radiating up into her epigastric region and has been persistent since she was discharged from the hospital with similar complaints On February 2.  At this time she had dehydration and was given IV fluids.  CT imaging at that time was reassuring.  She had an additional ED visit 4 days ago with similar complaints, however at that time she was found to be constipated.  She was placed on Senokot which she was taking twice daily and probably developed diarrhea.  She reports too numerous to count episodes of diarrhea yesterday but has had no diarrhea today.  Her abdominal pain is constant and unchanged from her prior visits.  She also endorses pain at her perirectal area, sister at bedside who assists with her home care needs endorses she has had some redness in this area.  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  The history is provided by the patient.       Home Medications Prior to Admission medications   Medication Sig Start Date End Date Taking? Authorizing Provider  acetaminophen (TYLENOL) 500 MG tablet Take 1,000 mg by mouth in the morning and at bedtime.   Yes [provider]  amLODipine (NORVASC) 10 MG tablet Take 5 mg by mouth daily.   Yes [provider]  calcium carbonate (OS-CAL - DOSED IN MG OF ELEMENTAL CALCIUM) 1250 (500 Ca) MG tablet Take 1 tablet by mouth daily with breakfast.   Yes [provider]  cyanocobalamin 1000 MCG tablet Take 1 tablet (1,000 mcg total) by mouth daily. 08/07/22 09/06/22 Yes Alma Friendly, MD  diclofenac Sodium (VOLTAREN) 1 % GEL APPLY 1 APPLICATION TOPICALLY 4 TIMES A DAY  08/11/22  Yes Farrel Gordon, DO  ezetimibe (ZETIA) 10 MG tablet TAKE 1 TABLET BY MOUTH EVERY DAY IN THE EVENING Patient taking differently: Take 10 mg by mouth daily. 07/14/22  Yes Farrel Gordon, DO  Hyoscyamine Sulfate SL (LEVSIN/SL) 0.125 MG SUBL Place 1 tablet (0.125 mg total) under the tongue 2 (two) times daily as needed (abdominal pain). 08/15/22  Yes Milisa Kimbell, Almyra Free, PA-C  insulin aspart (NOVOLOG) 100 UNIT/ML injection Inject 6 Units into the skin daily with breakfast AND 10 Units daily with lunch AND 10 Units daily with supper. 02/10/22  Yes Farrel Gordon, DO  insulin degludec (TRESIBA FLEXTOUCH) 100 UNIT/ML FlexTouch Pen Inject 12 Units into the skin daily. 02/10/22  Yes Farrel Gordon, DO  levothyroxine (SYNTHROID) 100 MCG tablet Take 1 tablet (100 mcg total) by mouth daily before breakfast. 07/29/22 10/27/22 Yes Dessa Phi, DO  pantoprazole (PROTONIX) 40 MG tablet TAKE 1 TABLET BY MOUTH EVERY DAY Patient taking differently: Take 40 mg by mouth daily. 07/14/22  Yes Farrel Gordon, DO  pregabalin (LYRICA) 150 MG capsule TAKE 1 CAPSULE BY MOUTH TWO TIMES A DAY Patient taking differently: Take 150 mg by mouth 2 (two) times daily. 03/10/22  Yes Farrel Gordon, DO  sertraline (ZOLOFT) 100 MG tablet TAKE 2 TABLETS BY MOUTH EVERY DAY Patient taking differently: Take 200 mg by mouth daily. 05/04/22  Yes Farrel Gordon, DO  simvastatin (ZOCOR) 40 MG tablet TAKE 1 TABLET BY MOUTH EVERY DAY 07/26/22  Yes Farrel Gordon, DO  tiZANidine (ZANAFLEX) 4 MG tablet Take 1 tablet (4 mg total) by mouth at bedtime. Patient taking differently: Take 4 mg by mouth See admin instructions. Take 4 mg in the morning and 4 mg at bedtime 07/29/22 10/27/22 Yes Dessa Phi, DO  VITAMIN E PO Take 400 Units by mouth daily.   Yes [provider]  Accu-Chek Softclix Lancets lancets Use to check blood sugar up to 4 times per day. 03/05/22   Farrel Gordon, DO  Blood Glucose Monitoring Suppl (ACCU-CHEK GUIDE) w/Device KIT Check blood sugar 1 times per day.  02/10/22   Farrel Gordon, DO  Calcium Carb-Cholecalciferol (CALCIUM 600/VITAMIN D3) 600-800 MG-UNIT TABS Take 1 tablet by mouth daily.  Patient not taking: Reported on 08/15/2022    [provider]  glucose blood (ACCU-CHEK GUIDE) test strip Check blood sugar up to 4 times per day 03/05/22   Farrel Gordon, DO  Insulin Pen Needle (B-D ULTRAFINE III SHORT PEN) 31G X 8 MM MISC 1 each by Does not apply route daily. Diagnosis code: E11.9. Used to inject insulin daily. 12/03/21   Maudie Mercury, MD  OneTouch Delica Lancets 99991111 MISC Check blood sugar 3 times a day 03/14/20   Maudie Mercury, MD      Allergies    Bactrim [sulfamethoxazole-trimethoprim], Cephalexin, Gabapentin, Morphine and related, Vioxx [rofecoxib], and Penicillins    Review of Systems   Review of Systems  Constitutional:  Negative for chills and fever.  HENT:  Negative for congestion and sore throat.   Eyes: Negative.   Respiratory:  Negative for chest tightness and shortness of breath.   Cardiovascular:  Negative for chest pain.  Gastrointestinal:  Positive for abdominal pain. Negative for nausea.       Negative except as mentioned in HPI.    Genitourinary: Negative.   Musculoskeletal:  Negative for arthralgias, joint swelling and neck pain.  Skin: Negative.  Negative for rash and wound.  Neurological:  Negative for dizziness, weakness, light-headedness, numbness and headaches.  Psychiatric/Behavioral: Negative.      Physical Exam Updated Vital Signs BP (!) 144/64   Pulse 89   Temp 98.4 F (36.9 C) (Oral)   Resp 18   Ht 5' 9"$  (1.753 m)   Wt 72.6 kg   SpO2 97%   BMI 23.63 kg/m  Physical Exam Vitals and nursing note reviewed.  Constitutional:      Appearance: She is well-developed.  HENT:     Head: Normocephalic and atraumatic.  Eyes:     Conjunctiva/sclera: Conjunctivae normal.  Cardiovascular:     Rate and Rhythm: Normal rate and regular rhythm.     Heart sounds: Normal heart sounds.  Pulmonary:      Effort: Pulmonary effort is normal.     Breath sounds: Normal breath sounds. No wheezing.  Abdominal:     General: Bowel sounds are normal.     Palpations: Abdomen is soft. There is no mass.     Tenderness: There is abdominal tenderness in the periumbilical area. There is no guarding or rebound.     Hernia: No hernia is present.     Comments: Abdomen is soft without guarding.  Perirectal area reflects some moderate erythema in the intertriginous spaces, there are no ulcerations or other skin breakdown.  There is no peripheral scaling although there does appear to be 1 or 2 small satellite areas of erythema.  Of note, patient denies pruritus.  Musculoskeletal:        General: Normal range  of motion.     Cervical back: Normal range of motion.  Skin:    General: Skin is warm and dry.  Neurological:     Mental Status: She is alert.     ED Results / Procedures / Treatments   Labs (all labs ordered are listed, but only abnormal results are displayed) Labs Reviewed  URINALYSIS, ROUTINE W REFLEX MICROSCOPIC - Abnormal; Notable for the following components:      Result Value   Ketones, ur 20 (*)    Protein, ur 30 (*)    Leukocytes,Ua TRACE (*)    Bacteria, UA RARE (*)    All other components within normal limits  COMPREHENSIVE METABOLIC PANEL - Abnormal; Notable for the following components:   Creatinine, Ser 1.22 (*)    GFR, Estimated 46 (*)    All other components within normal limits  CBC WITH DIFFERENTIAL/PLATELET  LIPASE, BLOOD    EKG None  Radiology No results found.  Procedures Procedures    Medications Ordered in ED Medications  hyoscyamine (ANASPAZ) disintergrating tablet 0.125 mg (has no administration in time range)  Gerhardt's butt cream ( Topical Given 08/15/22 1451)    ED Course/ Medical Decision Making/ A&P                             Medical Decision Making Patient presenting with chronic abdominal pain, initially associated with constipation, after  which she was recommended twice daily Senokot.  After multiple days of taking this she developed diarrhea which seems to have been resolved as of yesterday.  She continues to have abdominal cramping, although her exam is benign with no acute abdominal findings.  Labs today are reassuring.  She will be prescribed a small course of Levsin to help with cramping, to advised to cut back on her Senokot 1 dose daily.  Hopefully this will prevent her from swinging back to full constipation since Levsin can be a little bit constipating.  Plan follow-up with her primary provider if symptoms persist or worsen.  Patient was stable at time of discharge.  Repeat abdominal exam unremarkable with no guarding, no concern for obstruction or acute abdomen.  Repeat imaging was not performed today, she had a CT abdomen pelvis 4 days ago which was reassuring.  Amount and/or Complexity of Data Reviewed Labs: ordered.    Details: She does have 20 ketones in her urine suggesting some mild dehydration, she was given IV fluids while here.  Her creatinine is 1.22 which is stable.  Other labs are normal.  Has a normal WBC count at 6.2.           Final Clinical Impression(s) / ED Diagnoses Final diagnoses:  Periumbilical abdominal pain  Diarrhea, unspecified type    Rx / DC Orders ED Discharge Orders          Ordered    Hyoscyamine Sulfate SL (LEVSIN/SL) 0.125 MG SUBL  2 times daily PRN        08/15/22 1534              Evalee Jefferson, Hershal Coria 08/15/22 1537    Davonna Belling, MD 08/18/22 [phone]

## 2022-08-15 NOTE — ED Triage Notes (Signed)
EMS called out for abdominal in the center above naval that radiates up. Scar noted by EMS from previous surgery but unknown what the surgery was for. Per EMS slightly hypertension. History of right side paralysis and left leg does not straighten. Patient recently went to [facility] for constipation and was given a laxative seen 08/10/22.

## 2022-08-15 NOTE — ED Notes (Signed)
Patient given meal tray.

## 2022-08-15 NOTE — ED Notes (Signed)
Per pt report, she will be discharging to her sister's house which is the primary address listed in her chart:   Address  [address] [place] [address]

## 2022-08-15 NOTE — Discharge Instructions (Signed)
I have prescribed you Levsin which is a medicine to help with abdominal cramping.  You may take this 2 times daily if needed for your abdominal pain.  This can be a little bit constipating so I am recommending you continue taking your Senokot but reduce your dose to 1 time daily, hopefully this will give you a good balance between constipation and diarrhea.  Your lab tests today are reassuring.  Follow-up with your primary provider for recheck if your symptoms are not improving with this treatment plan.  You are being sent home with a cream to apply to the skin at your bottom, hopefully this will help soothe this irritated looking skin.

## 2022-08-15 NOTE — ED Notes (Signed)
Explained to patient that we were waiting on [facility] to transport her back to her residence. Patient is upset she has been waiting and states "it's not your fault but if I could walk I would not ask for their help." Patient informed that EMS told this nurse that they did not know when they would be able to transport the patient home and she would be worked in between calls. Patient states she does not need anything else at this time.

## 2022-08-16 LAB — VITAMIN B1: Vitamin B1 (Thiamine): 107.7 nmol/L (ref 66.5–200.0)

## 2022-08-18 ENCOUNTER — Telehealth: Payer: Self-pay | Admitting: *Deleted

## 2022-08-19 ENCOUNTER — Telehealth: Payer: Self-pay
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
## 2022-08-20 ENCOUNTER — Telehealth: Payer: Self-pay | Admitting: *Deleted

## 2022-08-20 ENCOUNTER — Ambulatory Visit (INDEPENDENT_AMBULATORY_CARE_PROVIDER_SITE_OTHER): Payer: 59 | Admitting: Internal Medicine

## 2022-08-20 DIAGNOSIS — F444 Conversion disorder with motor symptom or deficit: Secondary | ICD-10-CM
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
## 2022-08-20 NOTE — Patient Instructions (Signed)
Thank you, Ms.[name] for allowing [place] to provide your care today.   I have reached out to a Education officer, museum and nurse for help with coordinating finding care for you. Please follow-up by telephone next week  Referrals ordered today:    Referral Orders         AMB Referral to [facility] (ACO Patients)      I have ordered the following medication/changed the following medications:   Stop the following medications: There are no discontinued medications.   Start the following medications: No orders of the defined types were placed in this encounter.    Follow up: next week by telehealth  We look forward to seeing you next time. Please call our clinic at [phone] if you have any questions or concerns. The best time to call is Monday-Friday from 9am-4pm, but there is someone available 24/7. If after hours or the weekend, call the main hospital number and ask for the Internal Medicine Resident On-Call. If you need medication refills, please notify your pharmacy one week in advance and they will send [place] a request.   Thank you for trusting me with your care. Wishing you the best!   Christiana Fuchs, [place]

## 2022-08-20 NOTE — Progress Notes (Unsigned)
  Care Coordination  Outreach Note  08/20/2022 Name: Toni Hill MRN: [id number] DOB: [birth year]   Care Coordination Outreach Attempts: An unsuccessful telephone outreach was attempted today to offer the patient information about available care coordination services as a benefit of their health plan.   Follow Up Plan:  Additional outreach attempts will be made to offer the patient care coordination information and services.   Encounter Outcome:  No Answer  [place]  Direct Dial: [phone]
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
## 2022-08-23 ENCOUNTER — Telehealth: Payer: Self-pay | Admitting: *Deleted

## 2022-08-23 NOTE — Progress Notes (Signed)
Internal Medicine Clinic Attending  Case discussed with Dr. Howie Ill  At the time of the visit.  We reviewed the resident's history and exam and pertinent patient test results.  I agree with the assessment, diagnosis, and plan of care documented in the resident's note.    Patient was able to speak with Eduard Clos. Patient is currently in the Emergency Department at an outside facility.

## 2022-08-23 NOTE — Patient Outreach (Signed)
  Care Coordination   Follow Up Visit Note   08/23/2022 Name: Toni Hill MRN: [id number] DOB: 08/15/1945  Toni Hill is a 77 y.o. year old female who sees Toni Gordon, DO for primary care. I  collaborated with CAPS Case Psychologist, counselling.  What matters to the patients health and wellness today?  Safety/placement    Goals Addressed   None     SDOH assessments and interventions completed:  Yes     Care Coordination Interventions:  Yes, provided  Interventions Today    Flowsheet Row Most Recent Value  Chronic Disease   Chronic disease during today's visit Hypertension (HTN)  General Interventions   General Interventions Discussed/Reviewed [employer], Level of Care, Communication with  [Outreach call to ER staff where pt currently admitted.]  Level of [place]  [facility] ED staff of recommendations for placement from there as well as of concerns at home]  Safety Interventions   Safety Discussed/Reviewed Safety Discussed  Home Safety Refer for community resources  [Outreach call to ER staff where pt currently admitted.]       Follow up plan: Follow up call scheduled for 08/24/22    Encounter Outcome:  Pt. Visit Completed

## 2022-08-23 NOTE — Progress Notes (Signed)
Outreach was made 08/20/22. Closing encounter   [place]  Direct Dial: [phone]

## 2022-08-27 ENCOUNTER — Telehealth: Payer: 59 | Admitting: Internal Medicine

## 2022-08-27 ENCOUNTER — Encounter: Payer: 59 | Admitting: Student

## 2022-09-01 ENCOUNTER — Telehealth: Payer: Self-pay | Admitting: *Deleted

## 2022-09-01 NOTE — Telephone Encounter (Signed)
Call from patient.  Is currently in Rehab and is going to be transferred to a facility in [place] in the next few days.  States was unable to keep her appointments.  Is currently being followed by an Endocrinologist for her Diabetes.  Patient states that she has no way of calling to schedule her appointments.  Patient was informed that she will need to involve the facility in making appointments so that they will be able to get Ambulance transportation for her and know when she has an appointment.  Patient voiced understanding of.  Patient states will be getting a phone in her room soon.  Patient asked for number of her Endocrinologist Dr. Iran Planas in [employer]. [phone].  Patient was disconnected before she was able to receive the number.

## 2022-09-02 ENCOUNTER — Encounter: Payer: Self-pay | Admitting: *Deleted

## 2022-09-02 NOTE — Patient Outreach (Signed)
  Care Coordination   Follow Up Visit Note   09/02/2022 Name: Toni Hill MRN: [id number] DOB: 1946/06/27  Toni Hill is a 77 y.o. year old female who sees Farrel Gordon, DO for primary care. I  contacted pt's sister as well as CM at [facility] where pt apparently remains in the ER.  Message left for CM to update me on pt's status and d/c plans.   What matters to the patients health and wellness today?  Safety/placement    Goals Addressed   None     SDOH assessments and interventions completed:  Yes     Care Coordination Interventions:  Yes, provided  Interventions Today    Flowsheet Row Most Recent Value  Chronic Disease   Chronic disease during today's visit Hypertension (HTN)  General Interventions   General Interventions Discussed/Reviewed Communication with, Level of Care  [message left for Hospital CM for updates on pt (remains in ER x 12 days)]  Communication with Social Work  [employer] ER SW]       Follow up plan: Follow up call scheduled for 09/10/22    Encounter Outcome:  Pt. Visit Completed

## 2022-09-08 ENCOUNTER — Encounter: Payer: Self-pay | Admitting: *Deleted

## 2022-09-08 NOTE — Patient Outreach (Signed)
  Care Coordination   Follow Up Visit Note   09/08/2022 Name: Toni Hill MRN: [id number] DOB: [birth year]  Toni Hill is a 77 y.o. year old female who sees Farrel Gordon, DO for primary care. I  received call from DSS/CAPS worker  What matters to the patients health and wellness today?  Safety/placement    Goals Addressed             This Visit's Progress    COMPLETED: Assist pt with alternative housing and support       Activities and task to complete in order to accomplish goals.    Pt for transfer to long term SNF bed at [facility] per pt's CAP's worker. Plan is for long term placement as her sister is no longer able to manage care for her at home.  CSW signing off.          SDOH assessments and interventions completed:  Yes     Care Coordination Interventions:  Yes, provided  Interventions Today    Flowsheet Row Most Recent Value  General Interventions   Level of [place]  [Long term SNF bed secured for pt per CAPS worker.]  Safety Interventions   Safety Discussed/Reviewed Home Safety       Follow up plan: No further intervention required.   Encounter Outcome:  Pt. Visit Completed

## 2022-11-02 ENCOUNTER — Other Ambulatory Visit: Payer: Self-pay | Admitting: Internal Medicine

## 2022-11-02 DIAGNOSIS — F444 Conversion disorder with motor symptom or deficit: Secondary | ICD-10-CM

## 2022-12-29 ENCOUNTER — Ambulatory Visit (INDEPENDENT_AMBULATORY_CARE_PROVIDER_SITE_OTHER): Payer: 59 | Admitting: Orthopaedic Surgery

## 2022-12-29 ENCOUNTER — Other Ambulatory Visit (INDEPENDENT_AMBULATORY_CARE_PROVIDER_SITE_OTHER): Payer: 59

## 2022-12-29 DIAGNOSIS — M25562 Pain in left knee: Secondary | ICD-10-CM | POA: Diagnosis not present

## 2022-12-29 DIAGNOSIS — M25561 Pain in right knee: Secondary | ICD-10-CM | POA: Diagnosis not present

## 2022-12-29 DIAGNOSIS — G8929 Other chronic pain: Secondary | ICD-10-CM

## 2022-12-29 MED ORDER — METHYLPREDNISOLONE ACETATE 40 MG/ML IJ SUSP
40.0000 mg | INTRAMUSCULAR | Status: AC | PRN
  Administered 2022-12-29: 40 mg via INTRA_ARTICULAR

## 2022-12-29 MED ORDER — LIDOCAINE HCL 1 % IJ SOLN
2.0000 mL | INTRAMUSCULAR | Status: AC | PRN
  Administered 2022-12-29: 2 mL

## 2022-12-29 MED ORDER — BUPIVACAINE HCL 0.5 % IJ SOLN
2.0000 mL | INTRAMUSCULAR | Status: AC | PRN
  Administered 2022-12-29: 2 mL via INTRA_ARTICULAR
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
## 2023-01-04 ENCOUNTER — Telehealth: Payer: Self-pay | Admitting: Orthopaedic Surgery

## 2023-01-04 NOTE — Telephone Encounter (Signed)
Faxed as requested to number provided

## 2023-01-04 NOTE — Telephone Encounter (Signed)
Received  call from Lerry Liner with [facility] needing printed note from 12/29/2022 visit faxed to her. The fax# is [phone]   The ph# is [phone]

## 2023-01-07 ENCOUNTER — Telehealth: Payer: Self-pay | Admitting: Orthopaedic Surgery

## 2023-01-07 NOTE — Telephone Encounter (Signed)
Received a call from Ayesha Rumpf w/ [facility] requesting medical records from any surgeries. I faxed Op note 01/15/2019 and medical records 07/10/2019-P [phone], ph [phone]

## 2023-04-22 ENCOUNTER — Other Ambulatory Visit (HOSPITAL_COMMUNITY): Payer: Self-pay
